# Patient Record
Sex: Male | Born: 1960
Health system: Southern US, Community
[De-identification: ages and names within clinical notes are randomized; demographics above are authoritative.]

## PROBLEM LIST (undated history)

## (undated) DIAGNOSIS — I10 Essential (primary) hypertension: Secondary | ICD-10-CM

## (undated) DIAGNOSIS — G4733 Obstructive sleep apnea (adult) (pediatric): Principal | ICD-10-CM

## (undated) DIAGNOSIS — E119 Type 2 diabetes mellitus without complications: Secondary | ICD-10-CM

## (undated) DIAGNOSIS — F32A Depression, unspecified: Secondary | ICD-10-CM

## (undated) DIAGNOSIS — F329 Major depressive disorder, single episode, unspecified: Secondary | ICD-10-CM

## (undated) DIAGNOSIS — K219 Gastro-esophageal reflux disease without esophagitis: Secondary | ICD-10-CM

## (undated) DIAGNOSIS — E785 Hyperlipidemia, unspecified: Secondary | ICD-10-CM

## (undated) DIAGNOSIS — G4730 Sleep apnea, unspecified: Secondary | ICD-10-CM

## (undated) DIAGNOSIS — E079 Disorder of thyroid, unspecified: Secondary | ICD-10-CM

## (undated) DIAGNOSIS — F419 Anxiety disorder, unspecified: Secondary | ICD-10-CM

## (undated) DIAGNOSIS — E039 Hypothyroidism, unspecified: Secondary | ICD-10-CM

## (undated) HISTORY — DX: Major depressive disorder, single episode, unspecified: F32.9

## (undated) HISTORY — DX: Obstructive sleep apnea (adult) (pediatric): G47.33

## (undated) HISTORY — DX: Disorder of thyroid, unspecified: E07.9

## (undated) HISTORY — DX: Hyperlipidemia, unspecified: E78.5

## (undated) HISTORY — DX: Type 2 diabetes mellitus without complications: E11.9

## (undated) HISTORY — DX: Sleep apnea, unspecified: G47.30

## (undated) HISTORY — PX: VASECTOMY: SHX75

## (undated) HISTORY — DX: Anxiety disorder, unspecified: F41.9

## (undated) HISTORY — DX: Depression, unspecified: F32.A

---

## 2000-09-26 ENCOUNTER — Ambulatory Visit (HOSPITAL_BASED_OUTPATIENT_CLINIC_OR_DEPARTMENT_OTHER): Admission: RE | Admit: 2000-09-26 | Discharge: 2000-09-26 | Payer: Self-pay | Admitting: Urology

## 2002-07-14 ENCOUNTER — Ambulatory Visit (HOSPITAL_BASED_OUTPATIENT_CLINIC_OR_DEPARTMENT_OTHER): Admission: RE | Admit: 2002-07-14 | Discharge: 2002-07-14 | Payer: Self-pay | Admitting: Internal Medicine

## 2004-04-01 HISTORY — PX: ANKLE SURGERY: SHX546

## 2005-01-08 ENCOUNTER — Ambulatory Visit (HOSPITAL_COMMUNITY): Admission: RE | Admit: 2005-01-08 | Discharge: 2005-01-08 | Payer: Self-pay | Admitting: Orthopaedic Surgery

## 2005-01-08 ENCOUNTER — Ambulatory Visit (HOSPITAL_BASED_OUTPATIENT_CLINIC_OR_DEPARTMENT_OTHER): Admission: RE | Admit: 2005-01-08 | Discharge: 2005-01-08 | Payer: Self-pay | Admitting: Orthopaedic Surgery

## 2009-07-17 ENCOUNTER — Emergency Department (HOSPITAL_COMMUNITY): Admission: EM | Admit: 2009-07-17 | Discharge: 2009-07-17 | Payer: Self-pay | Admitting: Emergency Medicine

## 2010-06-19 LAB — URINALYSIS, ROUTINE W REFLEX MICROSCOPIC
Bilirubin Urine: NEGATIVE
Glucose, UA: NEGATIVE mg/dL
Hgb urine dipstick: NEGATIVE
Ketones, ur: NEGATIVE mg/dL
Nitrite: NEGATIVE
Protein, ur: NEGATIVE mg/dL
Specific Gravity, Urine: 1.024 (ref 1.005–1.030)
Urobilinogen, UA: 0.2 mg/dL (ref 0.0–1.0)
pH: 5.5 (ref 5.0–8.0)

## 2010-08-17 NOTE — Op Note (Signed)
[facility]ernando Salinas. [facility]  Patient:    Robert Wise, Robert Wise                     MRN: [id number] Proc. Date: 09/26/00 Adm. Date:  [id number] Attending:  Katherine Roan                           Operative Report  PREOPERATIVE DIAGNOSIS:  Elective sterilization, bilateral hydroceles.  POSTOPERATIVE DIAGNOSIS:  Elective sterilization, bilateral hydroceles.  OPERATION:  Partial bilateral vasectomy.  ANESTHESIA: General.  SURGEON:  Rozanna Boer., M.D.  BRIEF HISTORY:  This 50 year old patient was admitted to have a vasectomy done for elective sterilization under general anesthesia due to his scrotal anatomy.  He has some bilateral hydroceles that make it difficult to feel the cord and the vas deferens.  He was thoroughly counselled before the procedure and signed the consent form and ______ to have this done at this time.  DESCRIPTION OF PROCEDURE:  The patient was placed supine on the operating table.  He was shaved, prepped and draped in the usual sterile fashion after induction of general anesthesia.  The left vas was then picked up and a small transverse incision in the upper scrotum was made.  The vas was delivered into the operative field, dissected free for about an inch and clamped and transected.  The ends of the vas were then cauterized and ligated with 3-0 chromic catgut.  The vas was dropped back into the left hemiscrotal compartment and 2 or 3 small 4-0 chromic stitches were then used to close the skin on this side.  On the patients right side, the vas was also similarly found.  A small transverse incision over the upper scrotum was made and the vas was delivered in a routine fashion.  Again, a segment about an inch long was then removed, transected and the ends cauterized and tied with 3-0 chromic catgut suture.  The skin edges were then reapproximated with interrupted 3-0 chromic catgut suture and a dressing of collodion was  applied.  The patient was taken to the recovery room in good condition to be later discharged as an outpatient with detailed written instructions. DD:  09/26/00 TD:  09/26/00 Job: [id number]

## 2010-08-17 NOTE — Op Note (Signed)
NAME:  Robert Wise, REDNER NO.:  [id number]   MEDICAL RECORD NO.:  [id number]          PATIENT TYPE:  AMB   LOCATION:  DSC                          FACILITY:  [facility]   PHYSICIAN:  Lubertha Basque. Dalldorf, M.D.DATE OF BIRTH:  1960/10/01   DATE OF PROCEDURE:  01/08/2005  DATE OF DISCHARGE:                                 OPERATIVE REPORT   PREOPERATIVE DIAGNOSIS:  Left ankle medial malleolus fracture.   POSTOPERATIVE DIAGNOSIS:  Left ankle medial malleolus fracture.   PROCEDURE:  ORIF of left ankle medial malleolus fracture.   ANESTHESIA:  General.   ATTENDING SURGEON:  Lubertha Basque. Jerl Santos, M.D.   ASSISTANT:  Lindwood Qua, P.A.   INDICATIONS FOR PROCEDURE:  The patient is a 50 year old man who fell  several days ago and sustained a displaced fracture of the medial malleolus.  He is offered ORIF in hopes of optimizing healing and potentially returning  him to work in a more expeditious fashion.  Informed operative consent was  obtained after discussing the possible complications of reaction to  anesthesia, infection, and neurovascular injury.   DESCRIPTION OF PROCEDURE:  The patient was taken to the operating suite  where general anesthetic was applied without difficulty.  He was positioned  supine and prepped and draped in normal sterile fashion.  After the  administration of IV antibiotic, the left leg was elevated, exsanguinated,  and a tourniquet inflated about the calf.  A medial incision was made with  dissection down to the medial malleolus fracture site.  Some periosteum was  removed from the fracture site allowing an anatomic reduction which was  stabilized with a towel clip.  This was then secured with a malleolar screw  from the [employer] small fragment set.  This was a partially threaded, 4.0-mm,  cancellous screw about 45 mm in length.  Adequate placement was confirmed by  fluoroscopy.  I then placed a second screw slightly more posterior in a  nearly  parallel fashion in one plane, seemed to be divergent on the other.  Again I used fluoroscopy to confirm adequate placement of hardware and  reduction of the fracture and read all these views myself to make  appropriate intraoperative decisions.  The wound was irrigated.  The  tourniquet was deflated, and a small amount of bleeding was easily  controlled with Bovie cautery.  The foot and toes became pink and warm  immediately.  A 2-0 undyed Vicryl was used to reapproximate subcutaneous  tissues followed by skin closure with staples.  Some Marcaine was injected  about the incision site.  Adaptic was applied followed by dry gauze and a  posterior splint of plaster with the ankle in neutral position.   Estimated blood loss and intraoperative fluids as well as accurate  tourniquet time can be obtained from anesthesia records.   DISPOSITION:  The patient was extubated in the operating room and taken to  recovery room in stable condition.  Plans were for him to go home the same  day and to follow up in the office in less than a week. I will contact him  by  phone tonight.      Lubertha Basque Jerl Santos, M.D.  Electronically Signed     PGD/MEDQ  D:  01/08/2005  T:  01/08/2005  Job:  [id number]

## 2011-03-13 ENCOUNTER — Ambulatory Visit (INDEPENDENT_AMBULATORY_CARE_PROVIDER_SITE_OTHER): Payer: PRIVATE HEALTH INSURANCE

## 2011-03-13 DIAGNOSIS — I10 Essential (primary) hypertension: Secondary | ICD-10-CM

## 2011-05-04 ENCOUNTER — Other Ambulatory Visit: Payer: Self-pay | Admitting: Family Medicine

## 2011-06-07 ENCOUNTER — Encounter: Payer: Self-pay | Admitting: Family Medicine

## 2011-06-07 ENCOUNTER — Telehealth: Payer: Self-pay

## 2011-06-07 NOTE — Telephone Encounter (Signed)
Dr Marylu Lund you want to write letter for pt?

## 2011-06-07 NOTE — Telephone Encounter (Signed)
I do not see an entry of mine in [facility] for this patient.  Can you bring me his written chart to review?

## 2011-06-07 NOTE — Telephone Encounter (Signed)
Pt took a pre-employment drug test elsewhere and thinks his rx for hydrocodone has cause him to test positive. Would like a note from [place] confirming we prescribe this to him.

## 2011-06-07 NOTE — Telephone Encounter (Signed)
LMOM on cell that request has been done and is ready for p/up

## 2011-08-21 ENCOUNTER — Ambulatory Visit (INDEPENDENT_AMBULATORY_CARE_PROVIDER_SITE_OTHER): Payer: PRIVATE HEALTH INSURANCE | Admitting: Family Medicine

## 2011-08-21 VITALS — BP 135/77 | HR 64 | Temp 98.2°F | Resp 16 | Ht 71.0 in | Wt 249.0 lb

## 2011-08-21 DIAGNOSIS — F411 Generalized anxiety disorder: Secondary | ICD-10-CM

## 2011-08-21 DIAGNOSIS — G8929 Other chronic pain: Secondary | ICD-10-CM

## 2011-08-21 DIAGNOSIS — K219 Gastro-esophageal reflux disease without esophagitis: Secondary | ICD-10-CM

## 2011-08-21 DIAGNOSIS — M549 Dorsalgia, unspecified: Secondary | ICD-10-CM

## 2011-08-21 DIAGNOSIS — F419 Anxiety disorder, unspecified: Secondary | ICD-10-CM

## 2011-08-21 DIAGNOSIS — E039 Hypothyroidism, unspecified: Secondary | ICD-10-CM

## 2011-08-21 DIAGNOSIS — E785 Hyperlipidemia, unspecified: Secondary | ICD-10-CM

## 2011-08-21 LAB — COMPREHENSIVE METABOLIC PANEL
ALT: 34 U/L (ref 0–53)
AST: 45 U/L — ABNORMAL HIGH (ref 0–37)
Albumin: 4.7 g/dL (ref 3.5–5.2)
Alkaline Phosphatase: 65 U/L (ref 39–117)
BUN: 13 mg/dL (ref 6–23)
CO2: 26 mEq/L (ref 19–32)
Calcium: 9.3 mg/dL (ref 8.4–10.5)
Chloride: 102 mEq/L (ref 96–112)
Creat: 0.77 mg/dL (ref 0.50–1.35)
Glucose, Bld: 137 mg/dL — ABNORMAL HIGH (ref 70–99)
Potassium: 4.4 mEq/L (ref 3.5–5.3)
Sodium: 137 mEq/L (ref 135–145)
Total Bilirubin: 0.5 mg/dL (ref 0.3–1.2)
Total Protein: 6.9 g/dL (ref 6.0–8.3)

## 2011-08-21 LAB — POCT CBC
Granulocyte percent: 56.4 %G (ref 37–80)
HCT, POC: 41.9 % — AB (ref 43.5–53.7)
Hemoglobin: 13.9 g/dL — AB (ref 14.1–18.1)
Lymph, poc: 1.8 (ref 0.6–3.4)
MCH, POC: 30.8 pg (ref 27–31.2)
MCHC: 33.2 g/dL (ref 31.8–35.4)
MCV: 92.8 fL (ref 80–97)
MID (cbc): 0.5 (ref 0–0.9)
MPV: 9 fL (ref 0–99.8)
POC Granulocyte: 3 (ref 2–6.9)
POC LYMPH PERCENT: 34 %L (ref 10–50)
POC MID %: 9.6 %M (ref 0–12)
Platelet Count, POC: 205 10*3/uL (ref 142–424)
RBC: 4.51 M/uL — AB (ref 4.69–6.13)
RDW, POC: 14.3 %
WBC: 5.3 10*3/uL (ref 4.6–10.2)

## 2011-08-21 LAB — LIPID PANEL
Cholesterol: 190 mg/dL (ref 0–200)
HDL: 31 mg/dL — ABNORMAL LOW (ref >39)
LDL Cholesterol: 95 mg/dL (ref 0–99)
Total CHOL/HDL Ratio: 6.1 Ratio
Triglycerides: 318 mg/dL — ABNORMAL HIGH (ref <150)
VLDL: 64 mg/dL — ABNORMAL HIGH (ref 0–40)

## 2011-08-21 LAB — TSH: TSH: 4.58 u[IU]/mL — ABNORMAL HIGH (ref 0.350–4.500)

## 2011-08-21 MED ORDER — PAROXETINE HCL 20 MG PO TABS: 20.0000 mg | ORAL_TABLET | ORAL | Status: DC

## 2011-08-21 MED ORDER — OMEPRAZOLE 20 MG PO CPDR: 20.0000 mg | DELAYED_RELEASE_CAPSULE | Freq: Every day | ORAL | Status: DC

## 2011-08-21 MED ORDER — HYDROCODONE-ACETAMINOPHEN 7.5-500 MG PO TABS: 1.0000 | ORAL_TABLET | Freq: Four times a day (QID) | ORAL | Status: DC | PRN

## 2011-08-21 MED ORDER — PRAVASTATIN SODIUM 40 MG PO TABS: 40.0000 mg | ORAL_TABLET | Freq: Every day | ORAL | Status: DC

## 2011-08-21 MED ORDER — LEVOTHYROXINE SODIUM 150 MCG PO TABS: 150.0000 ug | ORAL_TABLET | Freq: Every day | ORAL | Status: DC

## 2011-08-21 NOTE — Progress Notes (Signed)
This 51 year old gentleman who comes in for refill on his medications. (We have 30 patients waiting today so I'm not able to spend a full time at a need to spend with this patient). He is a chronic patient of mine who has problems with alcohol and chronic back pain. He is currently attending AA meetings and try to hold his marriage together.  He's had no new symptoms he is now working driving a canker truck. He's had no alcohol for a week.  Objective: Alert no acute distress  HEENT: Unremarkable  Chest: Clear  Heart: Regular no murmur  Abdomen: Soft nontender with smooth liver edge about 2 cm below the right costal margin margin and about 8 cm in span in the midclavicular line.  Assessment: Chronic pain with need for refills, seems stable.  Plan dispensed next time talking about alcohol encouraging patient to continue his AA meetings. I've refilled his medicines for 5 months he'll return if there are any new problems. We'll check his labs today.

## 2011-10-20 ENCOUNTER — Ambulatory Visit (INDEPENDENT_AMBULATORY_CARE_PROVIDER_SITE_OTHER): Payer: PRIVATE HEALTH INSURANCE | Admitting: Family Medicine

## 2011-10-20 VITALS — BP 112/58 | HR 84 | Temp 97.8°F | Resp 16 | Ht 70.5 in | Wt 236.0 lb

## 2011-10-20 DIAGNOSIS — R739 Hyperglycemia, unspecified: Secondary | ICD-10-CM

## 2011-10-20 DIAGNOSIS — E119 Type 2 diabetes mellitus without complications: Secondary | ICD-10-CM

## 2011-10-20 LAB — POCT GLYCOSYLATED HEMOGLOBIN (HGB A1C): Hemoglobin A1C: 10

## 2011-10-20 LAB — COMPREHENSIVE METABOLIC PANEL
ALT: 21 U/L (ref 0–53)
AST: 18 U/L (ref 0–37)
Albumin: 4.4 g/dL (ref 3.5–5.2)
Alkaline Phosphatase: 82 U/L (ref 39–117)
BUN: 9 mg/dL (ref 6–23)
CO2: 27 mEq/L (ref 19–32)
Calcium: 9.3 mg/dL (ref 8.4–10.5)
Chloride: 100 mEq/L (ref 96–112)
Creat: 0.77 mg/dL (ref 0.50–1.35)
Glucose, Bld: 258 mg/dL — ABNORMAL HIGH (ref 70–99)
Potassium: 4.4 mEq/L (ref 3.5–5.3)
Sodium: 136 mEq/L (ref 135–145)
Total Bilirubin: 0.4 mg/dL (ref 0.3–1.2)
Total Protein: 7 g/dL (ref 6.0–8.3)

## 2011-10-20 LAB — GLUCOSE, POCT (MANUAL RESULT ENTRY): POC Glucose: 248 mg/dl — AB (ref 70–99)

## 2011-10-20 MED ORDER — FREESTYLE SYSTEM KIT: 1.0000 | PACK | Status: AC | PRN

## 2011-10-20 MED ORDER — SITAGLIPTIN PHOS-METFORMIN HCL 50-500 MG PO TABS: 1.0000 | ORAL_TABLET | Freq: Two times a day (BID) | ORAL | Status: DC

## 2011-10-20 NOTE — Progress Notes (Signed)
51 yo with 10 days of polyuria, blurry vision, diarrhea  Has completely stopped the alcohol as of May  Positive family hx of diabetes.  Objective  Results for orders placed in visit on 10/20/11  POCT GLYCOSYLATED HEMOGLOBIN (HGB A1C)      Component Value Range   Hemoglobin A1C 10.0    GLUCOSE, POCT (MANUAL RESULT ENTRY)      Component Value Range   POC Glucose 248 (*) 70 - 99 mg/dl

## 2011-10-20 NOTE — Patient Instructions (Addendum)
Diabetes Meal Planning Guide The diabetes meal planning guide is a tool to help you plan your meals and snacks. It is important for people with diabetes to manage their blood glucose (sugar) levels. Choosing the right foods and the right amounts throughout your day will help control your blood glucose. Eating right can even help you improve your blood pressure and reach or maintain a healthy weight. CARBOHYDRATE COUNTING MADE EASY When you eat carbohydrates, they turn to sugar. This raises your blood glucose level. Counting carbohydrates can help you control this level so you feel better. When you plan your meals by counting carbohydrates, you can have more flexibility in what you eat and balance your medicine with your food intake. Carbohydrate counting simply means adding up the total amount of carbohydrate grams in your meals and snacks. Try to eat about the same amount at each meal. Foods with carbohydrates are listed below. Each portion below is 1 carbohydrate serving or 15 grams of carbohydrates. Ask your dietician how many grams of carbohydrates you should eat at each meal or snack. Grains and Starches  1 slice bread.    English muffin or hotdog/hamburger bun.    cup cold cereal (unsweetened).   ? cup cooked pasta or rice.    cup starchy vegetables (corn, potatoes, peas, beans, winter squash).   1 tortilla (6 inches).    bagel.   1 waffle or pancake (size of a CD).    cup cooked cereal.   4 to 6 small crackers.  *Whole grain is recommended. Fruit  1 cup fresh unsweetened berries, melon, papaya, pineapple.   1 small fresh fruit.    banana or mango.    cup fruit juice (4 oz unsweetened).    cup canned fruit in natural juice or water.   2 tbs dried fruit.   12 to 15 grapes or cherries.  Milk and Yogurt  1 cup fat-free or 1% milk.   1 cup soy milk.   6 oz light yogurt with sugar-free sweetener.   6 oz low-fat soy yogurt.   6 oz plain yogurt.   Vegetables  1 cup raw or  cup cooked is counted as 0 carbohydrates or a "free" food.   If you eat 3 or more servings at 1 meal, count them as 1 carbohydrate serving.  Other Carbohydrates   oz chips or pretzels.    cup ice cream or frozen yogurt.    cup sherbet or sorbet.   2 inch square cake, no frosting.   1 tbs honey, sugar, jam, jelly, or syrup.   2 small cookies.   3 squares of graham crackers.   3 cups popcorn.   6 crackers.   1 cup broth-based soup.   Count 1 cup casserole or other mixed foods as 2 carbohydrate servings.   Foods with less than 20 calories in a serving may be counted as 0 carbohydrates or a "free" food.  You may want to purchase a book or computer software that lists the carbohydrate gram counts of different foods. In addition, the nutrition facts panel on the labels of the foods you eat are a good source of this information. The label will tell you how big the serving size is and the total number of carbohydrate grams you will be eating per serving. Divide this number by 15 to obtain the number of carbohydrate servings in a portion. Remember, 1 carbohydrate serving equals 15 grams of carbohydrate. SERVING SIZES Measuring foods and serving sizes helps you   make sure you are getting the right amount of food. The list below tells how big or small some common serving sizes are.  1 oz.........4 stacked dice.   3 oz........Marland KitchenDeck of cards.   1 tsp.......Marland KitchenTip of little finger.   1 tbs......Marland KitchenMarland KitchenThumb.   2 tbs.......Marland KitchenGolf ball.    cup......Marland KitchenHalf of a fist.   1 cup.......Marland KitchenA fist.  SAMPLE DIABETES MEAL PLAN Below is a sample meal plan that includes foods from the grain and starches, dairy, vegetable, fruit, and meat groups. A dietician can individualize a meal plan to fit your calorie needs and tell you the number of servings needed from each food group. However, controlling the total amount of carbohydrates in your meal or snack is more important than  making sure you include all of the food groups at every meal. You may interchange carbohydrate containing foods (dairy, starches, and fruits). The meal plan below is an example of a 2000 calorie diet using carbohydrate counting. This meal plan has 17 carbohydrate servings. Breakfast  1 cup oatmeal (2 carb servings).    cup light yogurt (1 carb serving).   1 cup blueberries (1 carb serving).    cup almonds.  Snack  1 large apple (2 carb servings).   1 low-fat string cheese stick.  Lunch  Chicken breast salad.   1 cup spinach.    cup chopped tomatoes.   2 oz chicken breast, sliced.   2 tbs low-fat [place] dressing.   12 whole-wheat crackers (2 carb servings).   12 to 15 grapes (1 carb serving).   1 cup low-fat milk (1 carb serving).  Snack  1 cup carrots.    cup hummus (1 carb serving).  Dinner  3 oz broiled salmon.   1 cup brown rice (3 carb servings).  Snack  1  cups steamed broccoli (1 carb serving) drizzled with 1 tsp olive oil and lemon juice.   1 cup light pudding (2 carb servings).  DIABETES MEAL PLANNING WORKSHEET Your dietician can use this worksheet to help you decide how many servings of foods and what types of foods are right for you.  BREAKFAST Food Group and Servings / Carb Servings Grain/Starches __________________________________ Dairy __________________________________________ Vegetable ______________________________________ Fruit ___________________________________________ Meat __________________________________________ Fat ____________________________________________ LUNCH Food Group and Servings / Carb Servings Grain/Starches ___________________________________ Dairy ___________________________________________ Fruit ____________________________________________ Meat ___________________________________________ Fat _____________________________________________ Laural Golden Food Group and Servings / Carb Servings Grain/Starches  ___________________________________ Dairy ___________________________________________ Fruit ____________________________________________ Meat ___________________________________________ Fat _____________________________________________ SNACKS Food Group and Servings / Carb Servings Grain/Starches ___________________________________ Dairy ___________________________________________ Vegetable _______________________________________ Fruit ____________________________________________ Meat ___________________________________________ Fat _____________________________________________ DAILY TOTALS Starches _________________________ Vegetable ________________________ Fruit ____________________________ Dairy ____________________________ Meat ____________________________ Fat ______________________________ Document Released: 12/13/2004 Document Revised: 03/07/2011 Document Reviewed: 10/24/2008 [employer] Patient Information 2012 [place], [place].Diabetes and Foot Care Diabetes may cause you to have a poor blood supply (circulation) to your legs and feet. Because of this, the skin may be thinner, break easier, and heal more slowly. You also may have nerve damage in your legs and feet causing decreased feeling. You may not notice minor injuries to your feet that could lead to serious problems or infections. Taking care of your feet is one of the most important things you can do for yourself.  HOME CARE INSTRUCTIONS  Do not go barefoot. Bare feet are easily injured.   Check your feet daily for blisters, cuts, and redness.   Wash your feet with warm water (not hot) and mild soap. Pat your feet and between your toes until completely dry.  Apply a moisturizing lotion that does not contain alcohol or petroleum jelly to the dry skin on your feet and to dry brittle toenails. Do not put it between your toes.   Trim your toenails straight across. Do not dig under them or around the cuticle.   Do not cut  corns or calluses, or try to remove them with medicine.   Wear clean cotton socks or stockings every day. Make sure they are not too tight. Do not wear knee high stockings since they may decrease blood flow to your legs.   Wear leather shoes that fit properly and have enough cushioning. To break in new shoes, wear them just a few hours a day to avoid injuring your feet.   Wear shoes at all times, even in the house.   Do not cross your legs. This may decrease the blood flow to your feet.   If you find a minor scrape, cut, or break in the skin on your feet, keep it and the skin around it clean and dry. These areas may be cleansed with mild soap and water. Do not use peroxide, alcohol, iodine or Merthiolate.   When you remove an adhesive bandage, be sure not to harm the skin around it.   If you have a wound, look at it several times a day to make sure it is healing.   Do not use heating pads or hot water bottles. Burns can occur. If you have lost feeling in your feet or legs, you may not know it is happening until it is too late.   Report any cuts, sores or bruises to your caregiver. Do not wait!  SEEK MEDICAL CARE IF:   You have an injury that is not healing or you notice redness, numbness, burning, or tingling.   Your feet always feel cold.   You have pain or cramps in your legs and feet.  SEEK IMMEDIATE MEDICAL CARE IF:   There is increasing redness, swelling, or increasing pain in the wound.   There is a red line that goes up your leg.   Pus is coming from a wound.   You develop an unexplained oral temperature above 102 F (38.9 C), or as your caregiver suggests.   You notice a bad smell coming from an ulcer or wound.  MAKE SURE YOU:   Understand these instructions.   Will watch your condition.   Will get help right away if you are not doing well or get worse.  Document Released: 03/15/2000 Document Revised: 03/07/2011 Document Reviewed: 09/21/2008 [facility] Patient  Information 2012 [place], [place].Diabetes and Exercise Regular exercise is important and can help:   Control blood glucose (sugar).   Decrease blood pressure.    Control blood lipids (cholesterol, triglycerides).   Improve overall health.  BENEFITS FROM EXERCISE  Improved fitness.   Improved flexibility.   Improved endurance.   Increased bone density.   Weight control.   Increased muscle strength.   Decreased body fat.   Improvement of the body's use of insulin, a hormone.   Increased insulin sensitivity.   Reduction of insulin needs.   Reduced stress and tension.   Helps you feel better.  People with diabetes who add exercise to their lifestyle gain additional benefits, including:  Weight loss.   Reduced appetite.   Improvement of the body's use of blood glucose.   Decreased risk factors for heart disease:   Lowering of cholesterol and triglycerides.   Raising the level of good cholesterol (high-density  lipoproteins, HDL).   Lowering blood sugar.   Decreased blood pressure.  TYPE 1 DIABETES AND EXERCISE  Exercise will usually lower your blood glucose.   If blood glucose is greater than 240 mg/dl, check urine ketones. If ketones are present, do not exercise.   Location of the insulin injection sites may need to be adjusted with exercise. Avoid injecting insulin into areas of the body that will be exercised. For example, avoid injecting insulin into:   The arms when playing tennis.   The legs when jogging. For more information, discuss this with your caregiver.   Keep a record of:   Food intake.   Type and amount of exercise.   Expected peak times of insulin action.   Blood glucose levels.  Do this before, during, and after exercise. Review your records with your caregiver. This will help you to develop guidelines for adjusting food intake and insulin amounts.  TYPE 2 DIABETES AND EXERCISE  Regular physical activity can help control blood  glucose.   Exercise is important because it may:   Increase the body's sensitivity to insulin.   Improve blood glucose control.   Exercise reduces the risk of heart disease. It decreases serum cholesterol and triglycerides. It also lowers blood pressure.   Those who take insulin or oral hypoglycemic agents should watch for signs of hypoglycemia. These signs include dizziness, shaking, sweating, chills, and confusion.   Body water is lost during exercise. It must be replaced. This will help to avoid loss of body fluids (dehydration) or heat stroke.  Be sure to talk to your caregiver before starting an exercise program to make sure it is safe for you. Remember, any activity is better than none.  Document Released: 06/08/2003 Document Revised: 03/07/2011 Document Reviewed: 09/22/2008 [facility] Patient Information 2012 [place], [place].Diet should limit carbohydrates and sugary drinks

## 2011-10-24 ENCOUNTER — Telehealth: Payer: Self-pay

## 2011-10-24 NOTE — Telephone Encounter (Signed)
Patient needs script for ultra touch two STRIPS [facility] [place] [phone]

## 2011-10-25 ENCOUNTER — Other Ambulatory Visit: Payer: Self-pay

## 2011-10-25 MED ORDER — ONETOUCH ULTRA SYSTEM W/DEVICE KIT: 1.0000 | PACK | Freq: Once | Status: DC

## 2011-10-25 NOTE — Telephone Encounter (Signed)
Sent to pharmacy 

## 2011-10-26 NOTE — Telephone Encounter (Signed)
PT WIFE NOTIFIED

## 2011-10-28 ENCOUNTER — Telehealth: Payer: Self-pay

## 2011-10-28 NOTE — Telephone Encounter (Signed)
Patients wife called stating patient has been diagnosed last week with diabetes and has been trying to get medication for it, but has had trouble getting insurance to cover supplies. She has not been able to check patients sugar so she doesn't know if its high or low and patient is feeling very poorly. She stated that ins will cover One Touch and we rx'ed that but still needs lancets and strips. [facility] [place]. Please call ASAP at [phone].

## 2011-10-29 MED ORDER — GLUCOSE BLOOD VI STRP: ORAL_STRIP | Status: AC

## 2011-10-29 MED ORDER — ONETOUCH ULTRASOFT LANCETS MISC: Status: DC

## 2011-10-29 NOTE — Telephone Encounter (Signed)
Janumet was Rx for the medication.  I will send in test strips and lancets to the pharmacy.

## 2011-10-29 NOTE — Telephone Encounter (Signed)
Notified wife that Rxs were sent in w/note as to the type of monitor pt has, but advised pt to double check that it is for the correct monitor bf leaving the pharmacy. Wife agreed.

## 2011-12-02 ENCOUNTER — Ambulatory Visit (INDEPENDENT_AMBULATORY_CARE_PROVIDER_SITE_OTHER): Payer: PRIVATE HEALTH INSURANCE | Admitting: Family Medicine

## 2011-12-02 VITALS — BP 110/66 | HR 71 | Temp 98.0°F | Resp 16 | Ht 71.0 in | Wt 244.0 lb

## 2011-12-02 DIAGNOSIS — M549 Dorsalgia, unspecified: Secondary | ICD-10-CM

## 2011-12-02 DIAGNOSIS — K219 Gastro-esophageal reflux disease without esophagitis: Secondary | ICD-10-CM

## 2011-12-02 DIAGNOSIS — F411 Generalized anxiety disorder: Secondary | ICD-10-CM

## 2011-12-02 DIAGNOSIS — G8929 Other chronic pain: Secondary | ICD-10-CM

## 2011-12-02 DIAGNOSIS — R7309 Other abnormal glucose: Secondary | ICD-10-CM

## 2011-12-02 DIAGNOSIS — R739 Hyperglycemia, unspecified: Secondary | ICD-10-CM

## 2011-12-02 DIAGNOSIS — E039 Hypothyroidism, unspecified: Secondary | ICD-10-CM

## 2011-12-02 DIAGNOSIS — E785 Hyperlipidemia, unspecified: Secondary | ICD-10-CM

## 2011-12-02 DIAGNOSIS — F419 Anxiety disorder, unspecified: Secondary | ICD-10-CM

## 2011-12-02 LAB — COMPREHENSIVE METABOLIC PANEL
ALT: 16 U/L (ref 0–53)
AST: 16 U/L (ref 0–37)
Albumin: 4.6 g/dL (ref 3.5–5.2)
Alkaline Phosphatase: 73 U/L (ref 39–117)
BUN: 13 mg/dL (ref 6–23)
CO2: 25 mEq/L (ref 19–32)
Calcium: 9.7 mg/dL (ref 8.4–10.5)
Chloride: 104 mEq/L (ref 96–112)
Creat: 0.71 mg/dL (ref 0.50–1.35)
Glucose, Bld: 150 mg/dL — ABNORMAL HIGH (ref 70–99)
Potassium: 4.6 mEq/L (ref 3.5–5.3)
Sodium: 137 mEq/L (ref 135–145)
Total Bilirubin: 0.2 mg/dL — ABNORMAL LOW (ref 0.3–1.2)
Total Protein: 6.7 g/dL (ref 6.0–8.3)

## 2011-12-02 LAB — POCT CBC
Granulocyte percent: 64.8 %G (ref 37–80)
HCT, POC: 45.1 % (ref 43.5–53.7)
Hemoglobin: 14.7 g/dL (ref 14.1–18.1)
Lymph, poc: 2 (ref 0.6–3.4)
MCH, POC: 29.8 pg (ref 27–31.2)
MCHC: 32.6 g/dL (ref 31.8–35.4)
MCV: 91.3 fL (ref 80–97)
MID (cbc): 0.7 (ref 0–0.9)
MPV: 9.7 fL (ref 0–99.8)
POC Granulocyte: 4.8 (ref 2–6.9)
POC LYMPH PERCENT: 26.4 %L (ref 10–50)
POC MID %: 8.8 %M (ref 0–12)
Platelet Count, POC: 280 10*3/uL (ref 142–424)
RBC: 4.94 M/uL (ref 4.69–6.13)
RDW, POC: 15.3 %
WBC: 7.4 10*3/uL (ref 4.6–10.2)

## 2011-12-02 LAB — POCT GLYCOSYLATED HEMOGLOBIN (HGB A1C): Hemoglobin A1C: 8.3

## 2011-12-02 MED ORDER — PRAVASTATIN SODIUM 40 MG PO TABS: 40.0000 mg | ORAL_TABLET | Freq: Every day | ORAL | Status: DC

## 2011-12-02 MED ORDER — HYDROCODONE-ACETAMINOPHEN 7.5-500 MG PO TABS: 1.0000 | ORAL_TABLET | Freq: Four times a day (QID) | ORAL | Status: DC | PRN

## 2011-12-02 MED ORDER — SITAGLIPTIN PHOS-METFORMIN HCL 50-500 MG PO TABS: 1.0000 | ORAL_TABLET | ORAL | Status: DC

## 2011-12-02 MED ORDER — LEVOTHYROXINE SODIUM 150 MCG PO TABS: 150.0000 ug | ORAL_TABLET | Freq: Every day | ORAL | Status: DC

## 2011-12-02 MED ORDER — OMEPRAZOLE 20 MG PO CPDR: 20.0000 mg | DELAYED_RELEASE_CAPSULE | Freq: Every day | ORAL | Status: DC

## 2011-12-02 MED ORDER — PAROXETINE HCL 20 MG PO TABS: 20.0000 mg | ORAL_TABLET | ORAL | Status: DC

## 2011-12-02 NOTE — Progress Notes (Signed)
@[email or web address] @  Patient ID: Robert Wise MRN: [id number], DOB: 1960-10-23, 51 y.o. Date of Encounter: 12/02/2011, 9:27 AM  Primary Physician: No primary provider on file.  Chief Complaint: Diabetes follow up  HPI: 51 y.o. year old male with history below presents for follow up of diabetes mellitus. Doing well. No issues or complaints. Taking medications daily without adverse effects. No polydipsia, polyphagia, polyuria, or nocturia.  Blood sugars at home: 90 to 210 Recently got his 90 day AA chip    No past medical history on file.   Home Meds: Prior to Admission medications   Medication Sig Start Date End Date Taking? Authorizing Provider  Blood Glucose Monitoring Suppl (ONE TOUCH ULTRA SYSTEM KIT) W/DEVICE KIT 1 kit by Does not apply route once. 10/25/11  Yes Morrell Riddle, PA-C  fish oil-omega-3 fatty acids 1000 MG capsule Take 2 g by mouth daily.   Yes Historical Provider, MD  glucose blood test strip Use as instructed 10/29/11 10/28/12 Yes Sarah Harvie Bridge, PA-C  glucose monitoring kit (FREESTYLE) monitoring kit 1 each by Does not apply route as needed for other. 10/20/11 10/19/12 Yes Elvina Sidle, MD  HYDROcodone-acetaminophen (LORTAB) 7.5-500 MG per tablet Take 1 tablet by mouth every 6 (six) hours as needed. 12/02/11  Yes Elvina Sidle, MD  Lancets [facility] ULTRASOFT) lancets Use as instructed 10/29/11 10/28/12 Yes Sarah Harvie Bridge, PA-C  levothyroxine (SYNTHROID, LEVOTHROID) 150 MCG tablet Take 1 tablet (150 mcg total) by mouth daily. 12/02/11  Yes Elvina Sidle, MD  omeprazole (PRILOSEC) 20 MG capsule Take 1 capsule (20 mg total) by mouth daily. 12/02/11  Yes Elvina Sidle, MD  PARoxetine (PAXIL) 20 MG tablet Take 1 tablet (20 mg total) by mouth every morning. 12/02/11  Yes Elvina Sidle, MD  pravastatin (PRAVACHOL) 40 MG tablet Take 1 tablet (40 mg total) by mouth at bedtime. 12/02/11  Yes Elvina Sidle, MD  sitaGLIPtan-metformin (JANUMET) 50-500 MG per tablet Take 1 tablet by mouth 1 day  or 1 dose. 12/02/11 12/01/12 Yes Elvina Sidle, MD    Allergies:  Allergies  Allergen Reactions  . Tricor (Fenofibrate)   . Zocor (Simvastatin)     History   Social History  . Marital Status: Married    Spouse Name: N/A    Number of Children: N/A  . Years of Education: N/A   Occupational History  . Not on file.   Social History Main Topics  . Smoking status: Current Everyday Smoker -- 1.5 packs/day for 37 years    Types: Cigarettes  . Smokeless tobacco: Not on file  . Alcohol Use: Not on file  . Drug Use: Not on file  . Sexually Active: Not on file   Other Topics Concern  . Not on file   Social History Narrative  . No narrative on file     Review of Systems: Constitutional: negative for chills, fever, night sweats, weight changes, or fatigue  HEENT: negative for vision changes, hearing loss, congestion, rhinorrhea, or epistaxis Cardiovascular: negative for chest pain, palpitations, diaphoresis, DOE, orthopnea, or edema Respiratory: negative for hemoptysis, wheezing, shortness of breath, dyspnea, or cough Abdominal: negative for abdominal pain, nausea, vomiting, diarrhea, or constipation Dermatological: negative for rash, erythema, or wounds Neurologic: negative for headache, dizziness, or syncope Renal:  Negative for polyuria, polydipsia, or dysuria All other systems reviewed and are otherwise negative with the exception to those above and in the HPI.   Physical Exam: Blood pressure 110/66, pulse 71, temperature 98 F (36.7 C), temperature source Oral, resp.  rate 16, height 5\' 11"  (1.803 m), weight 244 lb (110.678 kg), SpO2 95.00%., Body mass index is 34.03 kg/(m^2). General: Well developed, well nourished, in no acute distress. Head: Normocephalic, atraumatic, eyes without discharge, sclera non-icteric, nares are without discharge. Bilateral auditory canals clear, TM's are without perforation, pearly grey and translucent with reflective cone of light bilaterally.  Oral cavity moist, posterior pharynx without exudate, erythema, peritonsillar abscess, or post nasal drip.  Neck: Supple. No thyromegaly. Full ROM. No lymphadenopathy. Lungs: Clear bilaterally to auscultation without wheezes, rales, or rhonchi. Breathing is unlabored. Heart: RRR with S1 S2. No murmurs, rubs, or gallops appreciated. Abdomen: Soft, non-tender, non-distended with normoactive bowel sounds. No hepatosplenomegaly. No rebound/guarding. No obvious abdominal masses. Msk:  Strength and tone normal for age. Extremities/Skin: Warm and dry. No clubbing or cyanosis. No edema. No rashes, wounds, or suspicious lesions. Monofilament exam unremarkable bilaterally.  Neuro: Alert and oriented X 3. Moves all extremities spontaneously. Gait is normal. CNII-XII grossly in tact. Psych:  Responds to questions appropriately with a normal affect.   Labs:   ASSESSMENT AND PLAN:  51 y.o. year old male with fair control.  Needs to be more compliant.  Also will work on weight - 1. High blood sugar  sitaGLIPtan-metformin (JANUMET) 50-500 MG per tablet, POCT CBC, POCT glycosylated hemoglobin (Hb A1C), Comprehensive metabolic panel  2. Hyperlipidemia  pravastatin (PRAVACHOL) 40 MG tablet, Comprehensive metabolic panel  3. Anxiety  PARoxetine (PAXIL) 20 MG tablet, Comprehensive metabolic panel  4. GERD (gastroesophageal reflux disease)  omeprazole (PRILOSEC) 20 MG capsule, Comprehensive metabolic panel  5. Hypothyroid  levothyroxine (SYNTHROID, LEVOTHROID) 150 MCG tablet, Comprehensive metabolic panel  6. Back pain, chronic  HYDROcodone-acetaminophen (LORTAB) 7.5-500 MG per tablet, Comprehensive metabolic panel     Signed, Elvina Sidle, MD 12/02/2011 9:27 AM

## 2012-03-20 ENCOUNTER — Ambulatory Visit (INDEPENDENT_AMBULATORY_CARE_PROVIDER_SITE_OTHER): Payer: PRIVATE HEALTH INSURANCE | Admitting: Family Medicine

## 2012-03-20 VITALS — BP 130/82 | HR 77 | Temp 98.1°F | Resp 16 | Ht 71.0 in | Wt 240.0 lb

## 2012-03-20 DIAGNOSIS — G2581 Restless legs syndrome: Secondary | ICD-10-CM

## 2012-03-20 DIAGNOSIS — R739 Hyperglycemia, unspecified: Secondary | ICD-10-CM

## 2012-03-20 DIAGNOSIS — G8929 Other chronic pain: Secondary | ICD-10-CM

## 2012-03-20 DIAGNOSIS — H9209 Otalgia, unspecified ear: Secondary | ICD-10-CM

## 2012-03-20 DIAGNOSIS — Z0000 Encounter for general adult medical examination without abnormal findings: Secondary | ICD-10-CM

## 2012-03-20 DIAGNOSIS — R7309 Other abnormal glucose: Secondary | ICD-10-CM

## 2012-03-20 DIAGNOSIS — Z23 Encounter for immunization: Secondary | ICD-10-CM

## 2012-03-20 DIAGNOSIS — M549 Dorsalgia, unspecified: Secondary | ICD-10-CM

## 2012-03-20 DIAGNOSIS — E119 Type 2 diabetes mellitus without complications: Secondary | ICD-10-CM

## 2012-03-20 DIAGNOSIS — K219 Gastro-esophageal reflux disease without esophagitis: Secondary | ICD-10-CM

## 2012-03-20 LAB — POCT GLYCOSYLATED HEMOGLOBIN (HGB A1C): Hemoglobin A1C: 8.7

## 2012-03-20 MED ORDER — HYDROCODONE-ACETAMINOPHEN 7.5-500 MG PO TABS: 1.0000 | ORAL_TABLET | Freq: Four times a day (QID) | ORAL | Status: DC | PRN

## 2012-03-20 MED ORDER — SITAGLIPTIN PHOS-METFORMIN HCL 50-500 MG PO TABS: 1.0000 | ORAL_TABLET | ORAL | Status: DC

## 2012-03-20 MED ORDER — OMEPRAZOLE 20 MG PO CPDR
20.0000 mg | DELAYED_RELEASE_CAPSULE | Freq: Every day | ORAL | Status: DC
Start: 2012-03-20 — End: 2012-12-15

## 2012-03-20 MED ORDER — AMOXICILLIN 875 MG PO TABS: 875.0000 mg | ORAL_TABLET | Freq: Two times a day (BID) | ORAL | Status: DC

## 2012-03-20 MED ORDER — ROPINIROLE HCL 1 MG PO TABS: 1.0000 mg | ORAL_TABLET | Freq: Three times a day (TID) | ORAL | Status: DC

## 2012-03-20 NOTE — Addendum Note (Signed)
Addended by: Maryann Alar on: 03/20/2012 01:06 PM   Modules accepted: Orders

## 2012-03-20 NOTE — Progress Notes (Signed)
Patient ID: Robert Wise MRN: [id number], DOB: [birth year] 51 y.o. Date of Encounter: 03/20/2012, 12:42 PM  Primary Physician: Elvina Sidle, MD  Chief Complaint: Physical (CPE)  HPI: 51 y.o. y/o male with history noted below here for CPE.  Doing well. No issues/complaints.  Review of Systems: Consitutional: No fever, chills, fatigue, night sweats, lymphadenopathy, or weight changes. Eyes: No visual changes, eye redness, or discharge. ENT/Mouth: Ears: No otalgia, tinnitus, hearing loss, discharge. Nose: No congestion, rhinorrhea, sinus pain, or epistaxis. Throat: No sore throat, post nasal drip, or teeth pain. Cardiovascular: No CP, palpitations, diaphoresis, DOE, edema, orthopnea, PND. Respiratory: No cough, hemoptysis, SOB, or wheezing. Gastrointestinal: No anorexia, dysphagia, reflux, pain, nausea, vomiting, hematemesis, diarrhea, constipation, BRBPR, or melena. Genitourinary: No dysuria, frequency, urgency, hematuria, incontinence, nocturia, decreased urinary stream, discharge, impotence, or testicular pain/masses. Musculoskeletal: No decreased ROM, myalgias, stiffness, joint swelling, or weakness. Skin: No rash, erythema, lesion changes, pain, warmth, jaundice, or pruritis. Neurological: No headache, dizziness, syncope, seizures, tremors, memory loss, coordination problems, or paresthesias. Psychological: No anxiety, depression, hallucinations, SI/HI. Endocrine: No fatigue, polydipsia, polyphagia, polyuria, or known diabetes. All other systems were reviewed and are otherwise negative.  No past medical history on file.   No past surgical history on file.  Home Meds:  Prior to Admission medications   Medication Sig Start Date End Date Taking? Authorizing Provider  fish oil-omega-3 fatty acids 1000 MG capsule Take 2 g by mouth daily.   Yes Historical Provider, MD  glucose blood test strip Use as instructed 10/29/11 10/28/12 Yes Sarah Harvie Bridge, PA-C  glucose monitoring kit  (FREESTYLE) monitoring kit 1 each by Does not apply route as needed for other. 10/20/11 10/19/12 Yes Elvina Sidle, MD  HYDROcodone-acetaminophen (LORTAB) 7.5-500 MG per tablet Take 1 tablet by mouth every 6 (six) hours as needed (do not take during working hours). 03/20/12  Yes Elvina Sidle, MD  Lancets [facility] ULTRASOFT) lancets Use as instructed 10/29/11 10/28/12 Yes Sarah Harvie Bridge, PA-C  levothyroxine (SYNTHROID, LEVOTHROID) 150 MCG tablet Take 1 tablet (150 mcg total) by mouth daily. 12/02/11  Yes Elvina Sidle, MD  omeprazole (PRILOSEC) 20 MG capsule Take 1 capsule (20 mg total) by mouth daily. 03/20/12  Yes Elvina Sidle, MD  PARoxetine (PAXIL) 20 MG tablet Take 1 tablet (20 mg total) by mouth every morning. 12/02/11  Yes Elvina Sidle, MD  pravastatin (PRAVACHOL) 40 MG tablet Take 1 tablet (40 mg total) by mouth at bedtime. 12/02/11  Yes Elvina Sidle, MD  sitaGLIPtan-metformin (JANUMET) 50-500 MG per tablet Take 1 tablet by mouth 1 day or 1 dose. 03/20/12 03/20/13 Yes Elvina Sidle, MD  amoxicillin (AMOXIL) 875 MG tablet Take 1 tablet (875 mg total) by mouth 2 (two) times daily. 03/20/12   Elvina Sidle, MD  Blood Glucose Monitoring Suppl (ONE TOUCH ULTRA SYSTEM KIT) W/DEVICE KIT 1 kit by Does not apply route once. 10/25/11   Morrell Riddle, PA-C  rOPINIRole (REQUIP) 1 MG tablet Take 1 tablet (1 mg total) by mouth 3 (three) times daily. 03/20/12   Elvina Sidle, MD    Allergies:  Allergies  Allergen Reactions  . Tricor (Fenofibrate)   . Zocor (Simvastatin)     History   Social History  . Marital Status: Married    Spouse Name: N/A    Number of Children: N/A  . Years of Education: N/A   Occupational History  . Not on file.   Social History Main Topics  . Smoking status: Current Every Day Smoker -- 1.5 packs/day  for 37 years    Types: Cigarettes  . Smokeless tobacco: Not on file  . Alcohol Use: Not on file  . Drug Use: Not on file  . Sexually Active: Not on file    Other Topics Concern  . Not on file   Social History Narrative  . No narrative on file    No family history on file.  Physical Exam: Blood pressure 130/82, pulse 77, temperature 98.1 F (36.7 C), temperature source Oral, resp. rate 16, height 5\' 11"  (1.803 m), weight 240 lb (108.863 kg), SpO2 98.00%.  General: Well developed, well nourished, in no acute distress. HEENT: Normocephalic, atraumatic. Conjunctiva pink, sclera non-icteric. Pupils 2 mm constricting to 1 mm, round, regular, and equally reactive to light and accomodation. EOMI. Internal auditory canal clear. TMs with good cone of light and with dullness right TM.   Nasal mucosa pink. Nares are without discharge. No sinus tenderness. Oral mucosa pink. Dentition. Pharynx without exudate.   Neck: Supple. Trachea midline. No thyromegaly. Full ROM. No lymphadenopathy. Lungs: Clear to auscultation bilaterally without wheezes, rales, or rhonchi. Breathing is of normal effort and unlabored. Cardiovascular: RRR with S1 S2. No murmurs, rubs, or gallops appreciated. Distal pulses 2+ symmetrically. No carotid or abdominal bruits Abdomen: Soft, non-tender, non-distended with normoactive bowel sounds. No hepatosplenomegaly or masses. No rebound/guarding. No CVA tenderness. Without hernias.  Genitourinary:  circumcised male. No penile lesions. Testes descended bilaterally, and smooth without tenderness or masses.  Musculoskeletal: Full range of motion and 5/5 strength throughout. Without swelling, atrophy, tenderness, crepitus, or warmth. Extremities without clubbing, cyanosis, or edema. Calves supple. Skin: Warm and moist without erythema, ecchymosis, wounds, or rash. Neuro: A+Ox3. CN II-XII grossly intact. Moves all extremities spontaneously. Full sensation throughout. Normal gait. DTR 2+ throughout upper and lower extremities. Finger to nose intact. Psych:  Responds to questions appropriately with a normal affect.    Assessment/Plan:  51  y.o. y/o  male here for CPE -I explained to the patient that he cannot take narcotics while he is driving or within 6 hours of driving. I also explained the new regulations regarding insulin and DOT certification period is that he could have a one-year certificate. 1. Type 2 diabetes mellitus  POCT glycosylated hemoglobin (Hb A1C)  2. Otalgia  amoxicillin (AMOXIL) 875 MG tablet  3. High blood sugar  sitaGLIPtan-metformin (JANUMET) 50-500 MG per tablet  4. GERD (gastroesophageal reflux disease)  omeprazole (PRILOSEC) 20 MG capsule  5. Back pain, chronic  HYDROcodone-acetaminophen (LORTAB) 7.5-500 MG per tablet  6. Restless legs  rOPINIRole (REQUIP) 1 MG tablet   I further explained that for chronic pain meds, he will need to see a pain specialist in the future.  Signed, Elvina Sidle, MD 03/20/2012 12:42 PM     Results for orders placed in visit on 03/20/12  POCT GLYCOSYLATED HEMOGLOBIN (HGB A1C)      Component Value Range   Hemoglobin A1C 8.7

## 2012-03-30 ENCOUNTER — Telehealth: Payer: Self-pay

## 2012-03-30 NOTE — Telephone Encounter (Signed)
Received a request for Prior auth for pt's Janumet from pharmacy. Called pt to get more info about h/o meds for DM and pt reported that he was just able to get this Rx filled w/a coupon for $35 and coupon was good for 1 year, so he doesn't need to get a P.A for this med. (Pt confirmed that he was just Dxd w/ DM II in July and was put directly on Janumet and has not tried any other other meds). I faxed a note back to pharmacy w/this information.

## 2012-04-09 ENCOUNTER — Other Ambulatory Visit: Payer: Self-pay | Admitting: Family Medicine

## 2012-04-09 ENCOUNTER — Telehealth: Payer: Self-pay

## 2012-04-09 DIAGNOSIS — R739 Hyperglycemia, unspecified: Secondary | ICD-10-CM

## 2012-04-09 MED ORDER — SITAGLIPTIN PHOS-METFORMIN HCL 50-500 MG PO TABS: 1.0000 | ORAL_TABLET | Freq: Two times a day (BID) | ORAL | Status: DC

## 2012-04-09 NOTE — Telephone Encounter (Signed)
Please clarify dosage on Janumet.

## 2012-04-09 NOTE — Telephone Encounter (Signed)
PT STATES DR Kenyon Ana DIDN'T GIVE HIM ENOUGH JANUMET AND HE NEED TO HAVE A REFILL CALLED IN PLEASE CALL PT AT [phone]    [facility] [place]

## 2012-04-11 ENCOUNTER — Other Ambulatory Visit: Payer: Self-pay | Admitting: Family Medicine

## 2012-04-11 NOTE — Progress Notes (Signed)
Please explain to patient that I he should not take hydrocodone and drive.  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

## 2012-06-04 ENCOUNTER — Telehealth: Payer: Self-pay

## 2012-06-04 NOTE — Telephone Encounter (Signed)
Pt says they do not make the dosage of hydrocodone that he has taken in the past please call patient at [phone]

## 2012-06-04 NOTE — Telephone Encounter (Signed)
Hydrocodone has 3  refills on it at the pharmacy. Please advise if we can call [facility] [place] to change dose? He was written 7.5/500

## 2012-06-06 NOTE — Telephone Encounter (Signed)
Yes.  Ok to change to 7.5/325.

## 2012-06-06 NOTE — Telephone Encounter (Signed)
Called pharmacy to advise. Called patient to advise.

## 2012-06-07 ENCOUNTER — Telehealth: Payer: Self-pay

## 2012-06-07 NOTE — Telephone Encounter (Signed)
Wife states the meds were $35 at [facility] - this year they are $295 for the blood sugar pills ;  Requesting something less expensive   [phone]

## 2012-06-08 ENCOUNTER — Telehealth: Payer: Self-pay | Admitting: Radiology

## 2012-06-08 MED ORDER — SITAGLIPTIN PHOSPHATE 50 MG PO TABS: 50.0000 mg | ORAL_TABLET | Freq: Two times a day (BID) | ORAL | Status: DC

## 2012-06-08 MED ORDER — METFORMIN HCL 500 MG PO TABS: 500.0000 mg | ORAL_TABLET | Freq: Two times a day (BID) | ORAL | Status: DC

## 2012-06-08 NOTE — Telephone Encounter (Signed)
Called her, Thera Flake is costly can we please advise on cost effective alternative? [facility] [place]

## 2012-06-08 NOTE — Telephone Encounter (Signed)
Sent in seperatly per Rhoderick Moody. PA. Sent these

## 2012-06-09 NOTE — Telephone Encounter (Signed)
This has been taken care of - splint Janumet into Januvia and Metformin

## 2012-07-12 ENCOUNTER — Telehealth: Payer: Self-pay

## 2012-07-12 ENCOUNTER — Ambulatory Visit (INDEPENDENT_AMBULATORY_CARE_PROVIDER_SITE_OTHER): Payer: PRIVATE HEALTH INSURANCE | Admitting: Family Medicine

## 2012-07-12 VITALS — BP 159/96 | HR 67 | Temp 98.0°F | Resp 16 | Ht 72.0 in | Wt 235.0 lb

## 2012-07-12 DIAGNOSIS — E785 Hyperlipidemia, unspecified: Secondary | ICD-10-CM

## 2012-07-12 DIAGNOSIS — G894 Chronic pain syndrome: Secondary | ICD-10-CM

## 2012-07-12 DIAGNOSIS — E119 Type 2 diabetes mellitus without complications: Secondary | ICD-10-CM

## 2012-07-12 LAB — COMPREHENSIVE METABOLIC PANEL
ALT: 45 U/L (ref 0–53)
AST: 28 U/L (ref 0–37)
Albumin: 4.5 g/dL (ref 3.5–5.2)
Alkaline Phosphatase: 95 U/L (ref 39–117)
BUN: 13 mg/dL (ref 6–23)
CO2: 27 mEq/L (ref 19–32)
Calcium: 9.2 mg/dL (ref 8.4–10.5)
Chloride: 102 mEq/L (ref 96–112)
Creat: 0.82 mg/dL (ref 0.50–1.35)
Glucose, Bld: 145 mg/dL — ABNORMAL HIGH (ref 70–99)
Potassium: 4.2 mEq/L (ref 3.5–5.3)
Sodium: 135 mEq/L (ref 135–145)
Total Bilirubin: 0.3 mg/dL (ref 0.3–1.2)
Total Protein: 6.7 g/dL (ref 6.0–8.3)

## 2012-07-12 LAB — LIPID PANEL
Cholesterol: 116 mg/dL (ref 0–200)
HDL: 17 mg/dL — ABNORMAL LOW (ref >39)
LDL Cholesterol: 21 mg/dL (ref 0–99)
Total CHOL/HDL Ratio: 6.8 Ratio
Triglycerides: 391 mg/dL — ABNORMAL HIGH (ref <150)
VLDL: 78 mg/dL — ABNORMAL HIGH (ref 0–40)

## 2012-07-12 LAB — POCT GLYCOSYLATED HEMOGLOBIN (HGB A1C): Hemoglobin A1C: 9.5

## 2012-07-12 LAB — GLUCOSE, POCT (MANUAL RESULT ENTRY): POC Glucose: 161 mg/dl — AB (ref 70–99)

## 2012-07-12 MED ORDER — GLIPIZIDE 5 MG PO TABS: ORAL_TABLET | ORAL | Status: DC

## 2012-07-12 MED ORDER — HYDROCODONE-ACETAMINOPHEN 7.5-325 MG PO TABS: 1.0000 | ORAL_TABLET | Freq: Four times a day (QID) | ORAL | Status: DC | PRN

## 2012-07-12 NOTE — Telephone Encounter (Signed)
PATIENT STATES HE WAS IN TO SEE DR. Milus Glazier TODAY TO GET HIS MEDICATIONS REFILLED. HE HAS A PRESCRIPTION FOR HYDROCODONE, BUT HE WOULD LIKE TO KNOW IF HE CAN GET IT FILLED EARLY BECAUSE HE ONLY HAS A FEW LEFT? BEST PHONE [phone] (CELL)  [facility] [place]

## 2012-07-12 NOTE — Patient Instructions (Addendum)
Diabetes and Exercise  Regular exercise is important and can help:   · Control blood glucose (sugar).  · Decrease blood pressure.  ·   · Control blood lipids (cholesterol, triglycerides).  · Improve overall health.  BENEFITS FROM EXERCISE  · Improved fitness.  · Improved flexibility.  · Improved endurance.  · Increased bone density.  · Weight control.  · Increased muscle strength.  · Decreased body fat.  · Improvement of the body's use of insulin, a hormone.  · Increased insulin sensitivity.  · Reduction of insulin needs.  · Reduced stress and tension.  · Helps you feel better.  People with diabetes who add exercise to their lifestyle gain additional benefits, including:  · Weight loss.  · Reduced appetite.  · Improvement of the body's use of blood glucose.  · Decreased risk factors for heart disease:  · Lowering of cholesterol and triglycerides.  · Raising the level of good cholesterol (high-density lipoproteins, HDL).  · Lowering blood sugar.  · Decreased blood pressure.  TYPE 1 DIABETES AND EXERCISE  · Exercise will usually lower your blood glucose.  · If blood glucose is greater than 240 mg/dl, check urine ketones. If ketones are present, do not exercise.  · Location of the insulin injection sites may need to be adjusted with exercise. Avoid injecting insulin into areas of the body that will be exercised. For example, avoid injecting insulin into:  · The arms when playing tennis.  · The legs when jogging. For more information, discuss this with your caregiver.  · Keep a record of:  · Food intake.  · Type and amount of exercise.  · Expected peak times of insulin action.  · Blood glucose levels.  Do this before, during, and after exercise. Review your records with your caregiver. This will help you to develop guidelines for adjusting food intake and insulin amounts.   TYPE 2 DIABETES AND EXERCISE  · Regular physical activity can help control blood glucose.  · Exercise is important because it may:  · Increase the  body's sensitivity to insulin.  · Improve blood glucose control.  · Exercise reduces the risk of heart disease. It decreases serum cholesterol and triglycerides. It also lowers blood pressure.  · Those who take insulin or oral hypoglycemic agents should watch for signs of hypoglycemia. These signs include dizziness, shaking, sweating, chills, and confusion.  · Body water is lost during exercise. It must be replaced. This will help to avoid loss of body fluids (dehydration) or heat stroke.  Be sure to talk to your caregiver before starting an exercise program to make sure it is safe for you. Remember, any activity is better than none.   Document Released: 06/08/2003 Document Revised: 06/10/2011 Document Reviewed: 09/22/2008  Exit[employer]® Patient Information ©2013 [employer].

## 2012-07-12 NOTE — Progress Notes (Signed)
Patient ID: Robert Wise MRN: [id number], DOB: [birth year], 52 y.o. Date of Encounter: 07/12/2012, 8:59 AM  Primary Physician: Elvina Sidle, MD  Chief Complaint: Diabetes follow up  HPI: 52 y.o. year old male with history below presents for follow up of diabetes mellitus. Doing well. No issues or complaints. Taking medications daily without adverse effects. No polydipsia, polyphagia, polyuria, or nocturia.  Blood sugars at home:  improved Last A1C: 8.7  Has stopped the Januvia/janumet/metformin when he found a vitamin supplement was controlling the sugar  Past Medical History  Diagnosis Date  . Diabetes mellitus without complication   . Hyperlipidemia      Home Meds: Prior to Admission medications   Medication Sig Start Date End Date Taking? Authorizing Provider  Blood Glucose Monitoring Suppl (ONE TOUCH ULTRA SYSTEM KIT) W/DEVICE KIT 1 kit by Does not apply route once. 10/25/11  Yes Morrell Riddle, PA-C  fish oil-omega-3 fatty acids 1000 MG capsule Take 2 g by mouth daily.   Yes Historical Provider, MD  glucose blood test strip Use as instructed 10/29/11 10/28/12 Yes Sarah Harvie Bridge, PA-C  glucose monitoring kit (FREESTYLE) monitoring kit 1 each by Does not apply route as needed for other. 10/20/11 10/19/12 Yes Elvina Sidle, MD  Lancets [facility] ULTRASOFT) lancets Use as instructed 10/29/11 10/28/12 Yes Morrell Riddle, PA-C  levothyroxine (SYNTHROID, LEVOTHROID) 150 MCG tablet Take 1 tablet (150 mcg total) by mouth daily. 12/02/11  Yes Elvina Sidle, MD  omeprazole (PRILOSEC) 20 MG capsule Take 1 capsule (20 mg total) by mouth daily. 03/20/12  Yes Elvina Sidle, MD  PARoxetine (PAXIL) 20 MG tablet Take 1 tablet (20 mg total) by mouth every morning. 12/02/11  Yes Elvina Sidle, MD  pravastatin (PRAVACHOL) 40 MG tablet Take 1 tablet (40 mg total) by mouth at bedtime. 12/02/11  Yes Elvina Sidle, MD  rOPINIRole (REQUIP) 1 MG tablet Take 1 tablet (1 mg total) by mouth 3 (three) times  daily. 03/20/12  Yes Elvina Sidle, MD  HYDROcodone-acetaminophen (NORCO) 7.5-325 MG per tablet Take 1 tablet by mouth every 6 (six) hours as needed for pain. 07/12/12   Elvina Sidle, MD    Allergies:  Allergies  Allergen Reactions  . Tricor (Fenofibrate)   . Zocor (Simvastatin)     History   Social History  . Marital Status: Married    Spouse Name: N/A    Number of Children: N/A  . Years of Education: N/A   Occupational History  . Not on file.   Social History Main Topics  . Smoking status: Current Every Day Smoker -- 1.50 packs/day for 37 years    Types: Cigarettes  . Smokeless tobacco: Not on file  . Alcohol Use: No  . Drug Use: No  . Sexually Active: Yes    Birth Control/ Protection: None   Other Topics Concern  . Not on file   Social History Narrative  . No narrative on file     Review of Systems: Constitutional: negative for chills, fever, night sweats, weight changes, or fatigue  HEENT: negative for vision changes, hearing loss, congestion, rhinorrhea, or epistaxis Cardiovascular: negative for chest pain, palpitations, diaphoresis, DOE, orthopnea, or edema Respiratory: negative for hemoptysis, wheezing, shortness of breath, dyspnea, or cough Abdominal: negative for abdominal pain, nausea, vomiting, diarrhea, or constipation Dermatological: negative for rash, erythema, or wounds Neurologic: negative for headache, dizziness, or syncope Renal:  Negative for polyuria, polydipsia, or dysuria All other systems reviewed and are otherwise negative with the exception to those above  and in the HPI.   Physical Exam: Blood pressure 159/96, pulse 67, temperature 98 F (36.7 C), temperature source Oral, resp. rate 16, height 6' (1.829 m), weight 235 lb (106.595 kg), SpO2 96.00%., Body mass index is 31.86 kg/(m^2). General: Well developed, well nourished, in no acute distress. Head: Normocephalic, atraumatic, eyes without discharge, sclera non-icteric, nares are  without discharge. Bilateral auditory canals clear, TM's are without perforation, pearly grey and translucent with reflective cone of light bilaterally. Oral cavity moist, posterior pharynx without exudate, erythema, peritonsillar abscess, or post nasal drip.  Neck: Supple. No thyromegaly. Full ROM. No lymphadenopathy. Lungs: Clear bilaterally to auscultation without wheezes, rales, or rhonchi. Breathing is unlabored. Heart: RRR with S1 S2. No murmurs, rubs, or gallops appreciated. Abdomen: Soft, non-tender, non-distended with normoactive bowel sounds. No hepatosplenomegaly. No rebound/guarding. No obvious abdominal masses. Msk:  Strength and tone normal for age. Extremities/Skin: Warm and dry. No clubbing or cyanosis. No edema. No rashes, wounds, or suspicious lesions. Monofilament exam  normal.  Neuro: Alert and oriented X 3. Moves all extremities spontaneously. Gait is normal. CNII-XII grossly in tact. Psych:  Responds to questions appropriately with a normal affect.   Labs: Results for orders placed in visit on 07/12/12  GLUCOSE, POCT (MANUAL RESULT ENTRY)      Result Value Range   POC Glucose 161 (*) 70 - 99 mg/dl  POCT GLYCOSYLATED HEMOGLOBIN (HGB A1C)      Result Value Range   Hemoglobin A1C 9.5        ASSESSMENT AND PLAN:  52 y.o. year old male with chronic pain syndrome, diabetes Type 2 diabetes mellitus - Plan: POCT glucose (manual entry), POCT glycosylated hemoglobin (Hb A1C), Comprehensive metabolic panel, HYDROcodone-acetaminophen (NORCO) 7.5-325 MG per tablet, Lipid panel  Hyperlipidemia - Plan: Lipid panel  Chronic pain syndrome - Plan: HYDROcodone-acetaminophen (NORCO) 7.5-325 MG per tablet   -recheck 3 months   Signed, Elvina Sidle, MD 07/12/2012 8:59 AM

## 2012-07-13 ENCOUNTER — Other Ambulatory Visit: Payer: Self-pay | Admitting: Family Medicine

## 2012-07-13 DIAGNOSIS — E785 Hyperlipidemia, unspecified: Secondary | ICD-10-CM

## 2012-07-13 MED ORDER — PRAVASTATIN SODIUM 80 MG PO TABS: 80.0000 mg | ORAL_TABLET | Freq: Every day | ORAL | Status: DC

## 2012-08-03 NOTE — Telephone Encounter (Signed)
Yes, Dr Milus Glazier did renew this, but did not close.

## 2012-08-03 NOTE — Telephone Encounter (Signed)
This is still open, has it been completed?

## 2012-08-29 ENCOUNTER — Other Ambulatory Visit: Payer: Self-pay | Admitting: Family Medicine

## 2012-08-31 NOTE — Telephone Encounter (Signed)
Dr. Elbert Ewings - from lab notes in April sounds like you were considering changing statin when pt ran out of pravastatin.  Pharmacy sent RF request, do you want to stick with pravastatin or change to something stronger?

## 2012-09-01 ENCOUNTER — Other Ambulatory Visit: Payer: Self-pay | Admitting: Family Medicine

## 2012-09-01 DIAGNOSIS — F32A Depression, unspecified: Secondary | ICD-10-CM

## 2012-09-01 DIAGNOSIS — F329 Major depressive disorder, single episode, unspecified: Secondary | ICD-10-CM

## 2012-09-01 DIAGNOSIS — E785 Hyperlipidemia, unspecified: Secondary | ICD-10-CM

## 2012-09-01 MED ORDER — PAROXETINE HCL 20 MG PO TABS: 20.0000 mg | ORAL_TABLET | ORAL | Status: DC

## 2012-09-01 MED ORDER — ATORVASTATIN CALCIUM 20 MG PO TABS: 20.0000 mg | ORAL_TABLET | Freq: Every day | ORAL | Status: DC

## 2012-09-14 ENCOUNTER — Other Ambulatory Visit: Payer: Self-pay | Admitting: Family Medicine

## 2012-09-20 ENCOUNTER — Other Ambulatory Visit: Payer: Self-pay | Admitting: Family Medicine

## 2012-11-25 ENCOUNTER — Ambulatory Visit (INDEPENDENT_AMBULATORY_CARE_PROVIDER_SITE_OTHER): Payer: PRIVATE HEALTH INSURANCE | Admitting: Family Medicine

## 2012-11-25 VITALS — BP 122/78 | HR 76 | Temp 98.3°F | Resp 18 | Ht 70.5 in | Wt 233.0 lb

## 2012-11-25 DIAGNOSIS — M549 Dorsalgia, unspecified: Secondary | ICD-10-CM

## 2012-11-25 DIAGNOSIS — E119 Type 2 diabetes mellitus without complications: Secondary | ICD-10-CM

## 2012-11-25 DIAGNOSIS — G4733 Obstructive sleep apnea (adult) (pediatric): Secondary | ICD-10-CM

## 2012-11-25 LAB — POCT CBC
Granulocyte percent: 65.1 %G (ref 37–80)
HCT, POC: 43.9 % (ref 43.5–53.7)
Hemoglobin: 14.4 g/dL (ref 14.1–18.1)
Lymph, poc: 2 (ref 0.6–3.4)
MCH, POC: 30.2 pg (ref 27–31.2)
MCHC: 32.8 g/dL (ref 31.8–35.4)
MCV: 92.1 fL (ref 80–97)
MID (cbc): 0.5 (ref 0–0.9)
MPV: 9.4 fL (ref 0–99.8)
POC Granulocyte: 4.6 (ref 2–6.9)
POC LYMPH PERCENT: 28.4 %L (ref 10–50)
POC MID %: 6.5 %M (ref 0–12)
Platelet Count, POC: 207 10*3/uL (ref 142–424)
RBC: 4.77 M/uL (ref 4.69–6.13)
RDW, POC: 15 %
WBC: 7.1 10*3/uL (ref 4.6–10.2)

## 2012-11-25 LAB — POCT GLYCOSYLATED HEMOGLOBIN (HGB A1C): Hemoglobin A1C: 9.5

## 2012-11-25 LAB — COMPREHENSIVE METABOLIC PANEL
ALT: 22 U/L (ref 0–53)
AST: 21 U/L (ref 0–37)
Albumin: 4.7 g/dL (ref 3.5–5.2)
Alkaline Phosphatase: 87 U/L (ref 39–117)
BUN: 12 mg/dL (ref 6–23)
CO2: 27 mEq/L (ref 19–32)
Calcium: 9.7 mg/dL (ref 8.4–10.5)
Chloride: 99 mEq/L (ref 96–112)
Creat: 0.78 mg/dL (ref 0.50–1.35)
Glucose, Bld: 244 mg/dL — ABNORMAL HIGH (ref 70–99)
Potassium: 4.4 mEq/L (ref 3.5–5.3)
Sodium: 136 mEq/L (ref 135–145)
Total Bilirubin: 0.4 mg/dL (ref 0.3–1.2)
Total Protein: 7.2 g/dL (ref 6.0–8.3)

## 2012-11-25 MED ORDER — METFORMIN HCL 500 MG PO TABS: 500.0000 mg | ORAL_TABLET | Freq: Two times a day (BID) | ORAL | Status: DC

## 2012-11-25 MED ORDER — HYDROCODONE-ACETAMINOPHEN 5-325 MG PO TABS: 1.0000 | ORAL_TABLET | Freq: Four times a day (QID) | ORAL | Status: DC | PRN

## 2012-11-25 MED ORDER — GLIPIZIDE 5 MG PO TABS: ORAL_TABLET | ORAL | Status: DC

## 2012-11-25 NOTE — Progress Notes (Signed)
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 He's unable to afford the [employer]. Instead he is taking Lipitor 3 times a day. His blood sugars been running around 200. He also needs a refill on his pain medicine for chronic back pain.  Objective: No acute distress HEENT: Unremarkable Chest: Clear Heart: Regular no murmur Abdomen: Soft nontender Gait: Normal Skin: Unremarkable Results for orders placed in visit on 11/25/12  POCT CBC      Result Value Range   WBC 7.1  4.6 - 10.2 K/uL   Lymph, poc 2.0  0.6 - 3.4   POC LYMPH PERCENT 28.4  10 - 50 %L   MID (cbc) 0.5  0 - 0.9   POC MID % 6.5  0 - 12 %M   POC Granulocyte 4.6  2 - 6.9   Granulocyte percent 65.1  37 - 80 %G   RBC 4.77  4.69 - 6.13 M/uL   Hemoglobin 14.4  14.1 - 18.1 g/dL   HCT, POC 40.9  81.1 - 53.7 %   MCV 92.1  80 - 97 fL   MCH, POC 30.2  27 - 31.2 pg   MCHC 32.8  31.8 - 35.4 g/dL   RDW, POC 91.4     Platelet Count, POC 207  142 - 424 K/uL   MPV 9.4  0 - 99.8 fL  POCT GLYCOSYLATED HEMOGLOBIN (HGB A1C)      Result Value Range   Hemoglobin A1C 9.5     Assessment: We need to do better on the glucose control. Patient understands this he also understands that he needs to get his pain medicine down or he'll lose his ability to drive a truck.  Plan: Type 2 diabetes mellitus - Plan: glipiZIDE (GLUCOTROL) 5 MG tablet, metFORMIN (GLUCOPHAGE) 500 MG tablet, Ambulatory referral to Ophthalmology, Comprehensive metabolic panel, POCT glycosylated hemoglobin (Hb A1C)  Obstructive sleep apnea - Plan: Nocturnal polysomnography (NPSG), POCT CBC  Back pain - Plan: HYDROcodone-acetaminophen (NORCO) 5-325 MG per tablet  Signed, Elvina Sidle, MD

## 2012-11-26 ENCOUNTER — Telehealth: Payer: Self-pay

## 2012-11-26 NOTE — Telephone Encounter (Signed)
pts wife Robert Wise is returning a call back for lab results. She would like a call back at [phone]

## 2012-12-10 ENCOUNTER — Encounter: Payer: Self-pay | Admitting: Neurology

## 2012-12-15 ENCOUNTER — Ambulatory Visit (INDEPENDENT_AMBULATORY_CARE_PROVIDER_SITE_OTHER): Payer: PRIVATE HEALTH INSURANCE | Admitting: Neurology

## 2012-12-15 ENCOUNTER — Telehealth: Payer: Self-pay | Admitting: Neurology

## 2012-12-15 ENCOUNTER — Encounter: Payer: Self-pay | Admitting: Neurology

## 2012-12-15 VITALS — BP 125/79 | HR 69 | Ht 72.0 in | Wt 237.5 lb

## 2012-12-15 DIAGNOSIS — G2581 Restless legs syndrome: Secondary | ICD-10-CM

## 2012-12-15 DIAGNOSIS — F17200 Nicotine dependence, unspecified, uncomplicated: Secondary | ICD-10-CM

## 2012-12-15 DIAGNOSIS — E119 Type 2 diabetes mellitus without complications: Secondary | ICD-10-CM

## 2012-12-15 DIAGNOSIS — G4733 Obstructive sleep apnea (adult) (pediatric): Secondary | ICD-10-CM

## 2012-12-15 HISTORY — DX: Obstructive sleep apnea (adult) (pediatric): G47.33

## 2012-12-15 NOTE — Progress Notes (Signed)
Subjective:    Patient ID: Robert Wise is a 52 y.o. male.  HPI  Robert Foley, MD, PhD [facility] [address] [address] [place], [place] [address]  Dear Dr. Milus Glazier,   I saw your patient, Robert Wise, upon your kind request in my neurologic clinic today for initial consultation of his sleep disorder, in particular concern for obstructive sleep apnea. The patient is unaccompanied today. 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 He has been taking Norco for his chronic back pain and he recently counseled him on reducing his narcotic pain medication. He was diagnosed with severe OSA over 10 years ago and has been using a CPAP. He did not bring his machine and I do not have prior sleep study results. He does not know where he had the study done and does not recall what pressure he is on, he uses a nasal pillows mask, medium, he believes. He has mild RLS Sx and was started on Requip, but takes it very infrequently, as he does not have Sx every night. His wife has reported leg kicking in his sleep. He goes to bed around 10 PM and falls asleep within 5 minutes. He has to wake up at 4 AM. He still does not feel very rested, despite being compliant with CPAP. When he first started using CPAP many years, he felt improved. He has EDS and his ESS is 13/24 today. He drives a tanker truck. He has not fallen asleep while driving. He smokes 1 1/2 ppd. He quit EtOH on 08/14/11. He has a Hx of heavy drinking before. His sister has OSA and has a CPAP. He denies morning HAs. He wakes up on an average 3 times/night and has to go to the bathroom each time.  He is a restless sleeper and in the morning, the bed is quite disheveled.   He denies cataplexy, sleep paralysis, hypnagogic or  hypnopompic hallucinations, or sleep attacks. He does not report any vivid dreams, nightmares, dream enactments, or parasomnias, such as sleep talking or sleep walking. The patient has not had a sleep study or a home sleep test in over 10 years.  He consumes 6 to 8 caffeinated beverages per day, usually in the form of coffee, and energy sodas.  His bedroom is usually dark and cool. There is a TV in the bedroom and usually it is not on at night.  He has lost wt in comparison to when he was first diagnosed with OSA.  His Past Medical History Is Significant For: Past Medical History  Diagnosis Date  . Diabetes mellitus without complication   . Hyperlipidemia   . OSA (obstructive sleep apnea) 12/15/2012    His Past Surgical History Is Significant For: Past Surgical History  Procedure Laterality Date  . Ankle surgery Left 2006    His Family History Is Significant For: No family history on file.  His Social History Is Significant For: History   Social History  . Marital Status: Married    Spouse Name: Robert Wise    Number of Children: 3  . Years of Education: 11.5   Occupational History  .      Driver   Social History Main Topics  . Smoking status: Current Every Day Smoker -- 1.50 packs/day for 37 years  Types: Cigarettes  . Smokeless tobacco: Never Used  . Alcohol Use: No     Comment: quit: 08/14/2011  . Drug Use: No  . Sexual Activity: Yes    Birth Control/ Protection: None   Other Topics Concern  . Not on file   Social History Narrative   Patient lives at home with family.    Caffeine Use: 1 pot of coffee daily    His Allergies Are:  Allergies  Allergen Reactions  . Tricor [Fenofibrate]   . Zocor [Simvastatin]   :   His Current Medications Are:  Outpatient Encounter Prescriptions as of 12/15/2012  Medication Sig Dispense Refill  . atorvastatin (LIPITOR) 20 MG tablet Take 1 tablet (20 mg total) by mouth daily.  90 tablet  3  . Blood Glucose Monitoring Suppl  (ONE TOUCH ULTRA SYSTEM KIT) W/DEVICE KIT 1 kit by Does not apply route once.  1 each  0  . fish oil-omega-3 fatty acids 1000 MG capsule Take 2 g by mouth daily.      Marland Kitchen HYDROcodone-acetaminophen (NORCO) 5-325 MG per tablet Take 1 tablet by mouth every 6 (six) hours as needed for pain.  90 tablet  3  . JANUMET 50-500 MG per tablet Take 1 tablet by mouth daily.      Marland Kitchen levothyroxine (SYNTHROID, LEVOTHROID) 150 MCG tablet Take 1 tablet (150 mcg total) by mouth daily.  90 tablet  3  . omeprazole (PRILOSEC) 20 MG capsule TAKE 1 CAPSULE (20 MG TOTAL) BY MOUTH DAILY.  90 capsule  3  . PARoxetine (PAXIL) 20 MG tablet Take 1 tablet (20 mg total) by mouth every morning.  90 tablet  3  . [DISCONTINUED] glipiZIDE (GLUCOTROL) 5 MG tablet Once daily  90 tablet  3  . [DISCONTINUED] levothyroxine (SYNTHROID, LEVOTHROID) 150 MCG tablet Take 1 tablet (150 mcg total) by mouth daily before breakfast. PATIENT NEEDS OFFICE VISIT FOR ADDITIONAL REFILLS  90 tablet  0  . [DISCONTINUED] metFORMIN (GLUCOPHAGE) 500 MG tablet Take 1 tablet (500 mg total) by mouth 2 (two) times daily with a meal.  180 tablet  3  . [DISCONTINUED] omeprazole (PRILOSEC) 20 MG capsule Take 1 capsule (20 mg total) by mouth daily.  90 capsule  3  . [DISCONTINUED] pravastatin (PRAVACHOL) 80 MG tablet Take 1 tablet (80 mg total) by mouth daily.  90 tablet  3  . [DISCONTINUED] rOPINIRole (REQUIP) 1 MG tablet Take 1 tablet (1 mg total) by mouth 3 (three) times daily.  30 tablet  5   No facility-administered encounter medications on file as of 12/15/2012.  :  Review of Systems:  Out of a complete 14 point review of systems, all are reviewed and negative with the exception of these symptoms as listed below:  Review of Systems  Respiratory:       Snoring  Neurological:       Snoring    Objective:  Neurologic Exam  Physical Exam Physical Examination:   Filed Vitals:   12/15/12 0912  BP: 125/79  Pulse: 69    General Examination: The patient  is a very pleasant 52 y.o. male in no acute distress. He appears well-developed and well-nourished and adequately groomed.   HEENT: Normocephalic, atraumatic, pupils are equal, round and reactive to light and accommodation. Funduscopic exam is normal with sharp disc margins noted. Extraocular tracking is good without limitation to gaze excursion or nystagmus noted. Normal smooth pursuit is noted. Hearing is grossly intact. Tympanic membranes are clear bilaterally. Face is symmetric with  normal facial animation and normal facial sensation. Speech is clear with no dysarthria noted. There is no hypophonia. There is no lip, neck/head, jaw or voice tremor. Neck is supple with full range of passive and active motion. There are no carotid bruits on auscultation. Oropharynx exam reveals: mild mouth dryness, adequate dental hygiene and moderate airway crowding, due to larger tongue, elongated uvula. Mallampati is class I. Tongue protrudes centrally and palate elevates symmetrically. Tonsils are 1+ on the L, 1 to 2+ on the R. Neck size is 17.25 inches.   Chest: Clear to auscultation without wheezing, rhonchi or crackles noted.  Heart: S1+S2+0, regular and normal without murmurs, rubs or gallops noted.   Abdomen: Soft, non-tender and non-distended with normal bowel sounds appreciated on auscultation.  Extremities: There is no pitting edema in the distal lower extremities bilaterally. Pedal pulses are intact.  Skin: Warm and dry without trophic changes noted. There are no varicose veins.  Musculoskeletal: exam reveals no obvious joint deformities, tenderness or joint swelling or erythema.   Neurologically:  Mental status: The patient is awake, alert and oriented in all 4 spheres. His memory, attention, language and knowledge are appropriate. There is no aphasia, agnosia, apraxia or anomia. Speech is clear with normal prosody and enunciation. Thought process is linear. Mood is congruent and affect is normal.   Cranial nerves are as described above under HEENT exam. In addition, shoulder shrug is normal with equal shoulder height noted. Motor exam: Normal bulk, strength and tone is noted. There is no drift, tremor or rebound. Romberg is negative. Reflexes are 2+ throughout. Toes are downgoing bilaterally. Fine motor skills are intact with normal finger taps, normal hand movements, normal rapid alternating patting, normal foot taps and normal foot agility.  Cerebellar testing shows no dysmetria or intention tremor on finger to nose testing. Heel to shin is unremarkable bilaterally. There is no truncal or gait ataxia.  Sensory exam is intact to light touch, pinprick, vibration, temperature sense and proprioception in the upper and lower extremities.  Gait, station and balance are unremarkable. No veering to one side is noted. No leaning to one side is noted. Posture is age-appropriate and stance is narrow based. No problems turning are noted. He turns en bloc. Tandem walk is unremarkable. Intact toe and heel stance is noted.               Assessment and Plan:   In summary, Robert Wise is a very pleasant 52 y.o.-year old male with a prior Dx of obstructive sleep apnea (OSA), who presents for re-evaluation as he has residual EDS and his machine needs to be replaced. His . I had a long chat with the patient about my findings and the diagnosis, its prognosis and treatment options. We talked about medical treatments and non-pharmacological approaches. I explained in particular the risks and ramifications of untreated moderate to severe OSA, especially with respect to developing cardiovascular disease down the Road, including congestive heart failure, difficult to treat hypertension, cardiac arrhythmias, or stroke. Even type 2 diabetes has in part been linked to untreated OSA. We talked about smoking cessation and trying to maintain a healthy lifestyle in general, as well as the importance of weight control. I  encouraged the patient to eat healthy, exercise daily and keep well hydrated, to keep a scheduled bedtime and wake time routine, to not skip any meals and eat healthy snacks in between meals.  I recommended the following at this time: sleep study with potential positive airway  pressure titration.  I explained the sleep test procedure to the patient and also outlined possible surgical and non-surgical treatment options of OSA, including the use of a custom-made dental device, upper airway surgical options, such as pillar implants, radiofrequency surgery, tongue base surgery, and UPPP. I also explained the CPAP treatment option to the patient, who indicated that he would be willing to continue using CPAP. I explained the importance of being compliant with PAP treatment, not only for insurance purposes but primarily to improve His symptoms, and for the patient's long term health benefit, including to reduce His cardiovascular risks. He may need a change in his CPAP pressure settings. He reports overall having lost weight since his original Dx.  I answered all his questions today and the patient was in agreement. I would like to see him back after the sleep study is completed and encouraged him to call with any interim questions, concerns, problems or updates.   Thank you very much for allowing me to participate in the care of this nice patient. If I can be of any further assistance to you please do not hesitate to call me at [phone].  Sincerely,   Robert Foley, MD, PhD

## 2012-12-15 NOTE — Telephone Encounter (Signed)
Due to pt's high deductible he has opted to wait before proceeding with a sleep study to see if he can satisfy more of the deductible.  He will call for scheduling within the next 2 to 3 months.

## 2012-12-15 NOTE — Patient Instructions (Addendum)
You have obstructive sleep apnea or OSA, but it has been over 10 years, but I think we should proceed with a sleep study to determine how severe your OSA is and in order to get you a new machine. Please remember, the risks and ramifications of moderate to severe obstructive sleep apnea or OSA are: Cardiovascular disease, including congestive heart failure, stroke, difficult to control hypertension, arrhythmias, and even type 2 diabetes has been linked to untreated OSA. Sleep apnea causes disruption of sleep and sleep deprivation in most cases, which, in turn, can cause recurrent headaches, problems with memory, mood, concentration, focus, and vigilance. Most people with untreated sleep apnea report excessive daytime sleepiness, which can affect their ability to drive. Please do not drive if you feel sleepy.  I will see you back after your sleep study to go over the test results and where to go from there. We will call you after your sleep study and to set up an appointment at the time.   Please remember to bring your CPAP machine and mask for comparison for your sleep study.

## 2012-12-21 ENCOUNTER — Other Ambulatory Visit: Payer: Self-pay | Admitting: Family Medicine

## 2013-01-05 ENCOUNTER — Other Ambulatory Visit: Payer: Self-pay | Admitting: Family Medicine

## 2013-01-05 ENCOUNTER — Telehealth: Payer: Self-pay

## 2013-01-05 DIAGNOSIS — M549 Dorsalgia, unspecified: Secondary | ICD-10-CM

## 2013-01-05 MED ORDER — HYDROCODONE-ACETAMINOPHEN 5-325 MG PO TABS: 1.0000 | ORAL_TABLET | Freq: Four times a day (QID) | ORAL | Status: DC | PRN

## 2013-01-05 NOTE — Telephone Encounter (Signed)
PT STATES HE RECEIVED A LETTER FROM [facility] STATING THEY CAN'T REFILL HIS HYDROCODONE ANYMORE SO HE ISN'T SURE WHO IS GOING TO BE DURING IT PLEASE CALL [phone]

## 2013-01-05 NOTE — Telephone Encounter (Signed)
Spoke with pt, explained to him that Hydrocodone would be refilled only with a Rx in hand to take to his pharmacy. Pt understood. He needs a refill on this. Call when ready to pick up.

## 2013-01-22 ENCOUNTER — Telehealth: Payer: Self-pay | Admitting: Family Medicine

## 2013-01-23 NOTE — Telephone Encounter (Signed)
Dr L, pt recently came in for RF on all of his meds, but he hasn't had a TSH for over a year. Do you want to give him RFs on levothyroxine? If so, how many mos?

## 2013-01-24 NOTE — Telephone Encounter (Signed)
Please ask patient to come in for TSH.

## 2013-01-25 ENCOUNTER — Telehealth: Payer: Self-pay | Admitting: Radiology

## 2013-01-25 DIAGNOSIS — M549 Dorsalgia, unspecified: Secondary | ICD-10-CM

## 2013-01-25 NOTE — Telephone Encounter (Signed)
Yes, he needs a TSH

## 2013-01-25 NOTE — Telephone Encounter (Signed)
LMOM for pt that we sent in 1 mos RF but asked that he RTC in the next month so that Dr L can run a TSH test.

## 2013-01-25 NOTE — Telephone Encounter (Signed)
Okay to call in one month refill

## 2013-01-25 NOTE — Telephone Encounter (Signed)
Patient wants renewal on Hydrocodone please advise

## 2013-01-25 NOTE — Telephone Encounter (Signed)
We can no longer call this in. Patient has to pick up RX.

## 2013-01-26 MED ORDER — HYDROCODONE-ACETAMINOPHEN 5-325 MG PO TABS: 1.0000 | ORAL_TABLET | Freq: Four times a day (QID) | ORAL | Status: DC | PRN

## 2013-01-26 NOTE — Telephone Encounter (Signed)
Ok, will print,you sign tomorrow.

## 2013-01-26 NOTE — Addendum Note (Signed)
Addended byCaffie Damme on: 01/26/2013 08:34 AM   Modules accepted: Orders

## 2013-01-26 NOTE — Telephone Encounter (Signed)
I won't be in until tomorrow.

## 2013-01-28 ENCOUNTER — Other Ambulatory Visit: Payer: Self-pay | Admitting: Family Medicine

## 2013-01-28 DIAGNOSIS — M549 Dorsalgia, unspecified: Secondary | ICD-10-CM

## 2013-01-28 MED ORDER — HYDROCODONE-ACETAMINOPHEN 5-325 MG PO TABS: 1.0000 | ORAL_TABLET | Freq: Three times a day (TID) | ORAL | Status: DC | PRN

## 2013-02-24 ENCOUNTER — Other Ambulatory Visit: Payer: Self-pay | Admitting: Family Medicine

## 2013-02-24 NOTE — Telephone Encounter (Signed)
[facility] sent [facility]. Patient called stating that he knows he needs an OV but can't make it out here anytime soon. Would like an extension on his pills and will try to come in a the end of December for his DOT PE and his OV.  [phone]

## 2013-03-02 ENCOUNTER — Telehealth: Payer: Self-pay

## 2013-03-02 DIAGNOSIS — M549 Dorsalgia, unspecified: Secondary | ICD-10-CM

## 2013-03-02 MED ORDER — HYDROCODONE-ACETAMINOPHEN 5-325 MG PO TABS: 1.0000 | ORAL_TABLET | Freq: Two times a day (BID) | ORAL | Status: DC | PRN

## 2013-03-02 NOTE — Telephone Encounter (Signed)
Medicine called in.  i am tapering his hydrocodone as explained to him previously

## 2013-03-02 NOTE — Telephone Encounter (Signed)
Patient would like a refill on his hydrocodone

## 2013-03-03 NOTE — Telephone Encounter (Signed)
Can not be called, have advised him this is ready for pick up

## 2013-03-16 ENCOUNTER — Ambulatory Visit: Payer: Self-pay | Admitting: Family Medicine

## 2013-03-16 ENCOUNTER — Encounter: Payer: Self-pay | Admitting: Family Medicine

## 2013-03-16 VITALS — BP 142/86 | HR 77 | Temp 98.1°F | Resp 16 | Ht 70.0 in | Wt 237.6 lb

## 2013-03-16 DIAGNOSIS — M549 Dorsalgia, unspecified: Secondary | ICD-10-CM

## 2013-03-16 DIAGNOSIS — E119 Type 2 diabetes mellitus without complications: Secondary | ICD-10-CM

## 2013-03-16 DIAGNOSIS — E039 Hypothyroidism, unspecified: Secondary | ICD-10-CM

## 2013-03-16 DIAGNOSIS — Z0289 Encounter for other administrative examinations: Secondary | ICD-10-CM

## 2013-03-16 LAB — POCT GLYCOSYLATED HEMOGLOBIN (HGB A1C): Hemoglobin A1C: 7.8

## 2013-03-16 MED ORDER — LEVOTHYROXINE SODIUM 150 MCG PO TABS: 150.0000 ug | ORAL_TABLET | Freq: Every day | ORAL | Status: DC

## 2013-03-16 MED ORDER — HYDROCODONE-ACETAMINOPHEN 5-325 MG PO TABS: 1.0000 | ORAL_TABLET | Freq: Two times a day (BID) | ORAL | Status: DC | PRN

## 2013-03-16 NOTE — Progress Notes (Signed)
   Subjective:    Patient ID: Merlene Morse, male    DOB: 04-02-1960, 52 y.o.   MRN: [id number]  HPI  Here for check on thyroid Patient uses CPAP qhs, doesn't have data  Review of Systems    see DOT sheet Objective:   Physical Exam  U/A specific gravity: 1.010 Blood neg Protein neg Sugar negrr  BP 130/78 HEENT: unremarkable Neck:  Good ROM Chest:  Clear Heart: reg, no murmur Abdomen:  Soft, nontender without HSM or mass Ext: good ROM, no edema Skin:  Clear Genitalia normal circ male with no hernia Back without scoliosis Results for orders placed in visit on 03/16/13  POCT GLYCOSYLATED HEMOGLOBIN (HGB A1C)      Result Value Range   Hemoglobin A1C 7.8          Assessment & Plan:  We need more information about the CPAP usage of origin issues the DOT card. Patient has come back with his sleep apnea machine so I can look at his usage. He says her come back this weekend.  Signed, Elvina Sidle

## 2013-03-17 LAB — TSH: TSH: 3.782 u[IU]/mL (ref 0.350–4.500)

## 2013-04-05 ENCOUNTER — Other Ambulatory Visit: Payer: Self-pay | Admitting: Family Medicine

## 2013-04-05 NOTE — Telephone Encounter (Signed)
Dr L, do you RX Janumet for pt? At 8/27 OV it is not on pt's med list and not on plan for visit. It is on pt's med list at most recent OV but don't see Rxd by you anywhere?

## 2013-05-03 ENCOUNTER — Telehealth: Payer: Self-pay

## 2013-05-03 DIAGNOSIS — M549 Dorsalgia, unspecified: Secondary | ICD-10-CM

## 2013-05-03 NOTE — Telephone Encounter (Signed)
Patient's wife states that patient needs a refill on hydrocodone.   [phone]

## 2013-05-06 NOTE — Telephone Encounter (Signed)
Pt calling to check status of refill.   Please call: [phone]  bf

## 2013-05-06 NOTE — Telephone Encounter (Signed)
LMOM that message has been sent to Dr L for review and we will call him after he addresses the request for RF.

## 2013-05-06 NOTE — Telephone Encounter (Signed)
I am out of town for three weeks.  He may have one month of norco 5-325 #60 for bid use (I have been tapering him this year)  Next month he can have one qd for a month.

## 2013-05-07 MED ORDER — HYDROCODONE-ACETAMINOPHEN 5-325 MG PO TABS: 1.0000 | ORAL_TABLET | Freq: Two times a day (BID) | ORAL | Status: DC | PRN

## 2013-05-07 MED ORDER — HYDROCODONE-ACETAMINOPHEN 5-325 MG PO TABS: 1.0000 | ORAL_TABLET | Freq: Every day | ORAL | Status: DC | PRN

## 2013-05-07 NOTE — Telephone Encounter (Signed)
LMOM on VM for pt that Rx is ready and inst's for taper.

## 2013-05-07 NOTE — Telephone Encounter (Signed)
I have written for this as Dr. Milus GlazierLauenstein directed.  I have printed an rx to be filled on or after 06/04/13 for 1 qd (#30) as directed by Dr. Milus GlazierLauenstein.  Pt to follow up with Dr. Elbert EwingsL as planned

## 2013-05-07 NOTE — Telephone Encounter (Signed)
Can you please write this for Dr Milus GlazierLauenstein?

## 2013-06-08 ENCOUNTER — Telehealth: Payer: Self-pay

## 2013-06-08 DIAGNOSIS — E119 Type 2 diabetes mellitus without complications: Secondary | ICD-10-CM

## 2013-06-08 NOTE — Telephone Encounter (Signed)
Pharm called to stated that pt needs a PA on Janumet and he is out of medication. I advised that I did not receive the fax and she gave me ins info on phone. [employer] [phone]. ID# [id number]. Pt has been on Janumet since 12/2012 and was on Metformin before that.  Verified w/Dr L that pt is supposed to be taking the Janumet twice daily. Sent in Corrected Rx and completed PA on covermymeds.

## 2013-06-14 MED ORDER — SAXAGLIPTIN-METFORMIN ER 5-1000 MG PO TB24: 1.0000 | ORAL_TABLET | Freq: Every day | ORAL | Status: DC

## 2013-06-14 NOTE — Telephone Encounter (Signed)
kombiglyze ordered

## 2013-06-14 NOTE — Telephone Encounter (Signed)
PA was denied for Janumet bc pt has not tried/failed preferred alternative Kombiglyze. Dr L, do you want to Rx Kombiglyze?

## 2013-06-28 ENCOUNTER — Telehealth: Payer: Self-pay

## 2013-06-28 NOTE — Telephone Encounter (Signed)
Med refill   HYDROcodone-acetaminophen (NORCO) 5-325 MG per tablet   [phone]

## 2013-06-29 ENCOUNTER — Other Ambulatory Visit: Payer: Self-pay | Admitting: Family Medicine

## 2013-06-29 DIAGNOSIS — M549 Dorsalgia, unspecified: Secondary | ICD-10-CM

## 2013-06-29 MED ORDER — HYDROCODONE-ACETAMINOPHEN 5-325 MG PO TABS: 1.0000 | ORAL_TABLET | Freq: Two times a day (BID) | ORAL | Status: DC | PRN

## 2013-06-30 NOTE — Telephone Encounter (Signed)
Notified pt on VM that Rx ready. 

## 2013-07-26 ENCOUNTER — Telehealth: Payer: Self-pay

## 2013-07-26 DIAGNOSIS — M549 Dorsalgia, unspecified: Secondary | ICD-10-CM

## 2013-07-26 NOTE — Telephone Encounter (Signed)
Refill hydrocodone   [phone]

## 2013-07-27 MED ORDER — HYDROCODONE-ACETAMINOPHEN 5-325 MG PO TABS: 1.0000 | ORAL_TABLET | Freq: Two times a day (BID) | ORAL | Status: DC | PRN

## 2013-07-27 NOTE — Telephone Encounter (Signed)
Notified pt ready. 

## 2013-08-19 ENCOUNTER — Other Ambulatory Visit: Payer: Self-pay | Admitting: Family Medicine

## 2013-08-23 ENCOUNTER — Telehealth: Payer: Self-pay

## 2013-08-23 DIAGNOSIS — M549 Dorsalgia, unspecified: Secondary | ICD-10-CM

## 2013-08-23 MED ORDER — HYDROCODONE-ACETAMINOPHEN 5-325 MG PO TABS: 1.0000 | ORAL_TABLET | Freq: Two times a day (BID) | ORAL | Status: DC | PRN

## 2013-08-23 NOTE — Telephone Encounter (Signed)
Refill on Hydrocodone  [phone]

## 2013-08-24 NOTE — Telephone Encounter (Signed)
Notified pt ready. 

## 2013-09-21 ENCOUNTER — Other Ambulatory Visit: Payer: Self-pay | Admitting: Physician Assistant

## 2013-09-22 ENCOUNTER — Telehealth: Payer: Self-pay

## 2013-09-22 NOTE — Telephone Encounter (Signed)
Patient needs refill on hydrocodone.  °

## 2013-09-24 ENCOUNTER — Ambulatory Visit (INDEPENDENT_AMBULATORY_CARE_PROVIDER_SITE_OTHER): Payer: BC Managed Care – PPO | Admitting: Family Medicine

## 2013-09-24 ENCOUNTER — Ambulatory Visit (INDEPENDENT_AMBULATORY_CARE_PROVIDER_SITE_OTHER): Payer: BC Managed Care – PPO

## 2013-09-24 VITALS — BP 118/84 | HR 63 | Temp 97.8°F | Resp 16 | Ht 71.25 in | Wt 227.6 lb

## 2013-09-24 DIAGNOSIS — F3289 Other specified depressive episodes: Secondary | ICD-10-CM

## 2013-09-24 DIAGNOSIS — R5381 Other malaise: Secondary | ICD-10-CM

## 2013-09-24 DIAGNOSIS — F329 Major depressive disorder, single episode, unspecified: Secondary | ICD-10-CM

## 2013-09-24 DIAGNOSIS — T148 Other injury of unspecified body region: Secondary | ICD-10-CM

## 2013-09-24 DIAGNOSIS — W57XXXA Bitten or stung by nonvenomous insect and other nonvenomous arthropods, initial encounter: Secondary | ICD-10-CM

## 2013-09-24 DIAGNOSIS — I498 Other specified cardiac arrhythmias: Secondary | ICD-10-CM

## 2013-09-24 DIAGNOSIS — R5383 Other fatigue: Secondary | ICD-10-CM

## 2013-09-24 DIAGNOSIS — F32A Depression, unspecified: Secondary | ICD-10-CM

## 2013-09-24 DIAGNOSIS — R001 Bradycardia, unspecified: Secondary | ICD-10-CM

## 2013-09-24 DIAGNOSIS — M545 Low back pain, unspecified: Secondary | ICD-10-CM

## 2013-09-24 LAB — COMPREHENSIVE METABOLIC PANEL
ALT: 18 U/L (ref 0–53)
AST: 16 U/L (ref 0–37)
Albumin: 4.7 g/dL (ref 3.5–5.2)
Alkaline Phosphatase: 85 U/L (ref 39–117)
BUN: 11 mg/dL (ref 6–23)
CO2: 27 mEq/L (ref 19–32)
Calcium: 9.5 mg/dL (ref 8.4–10.5)
Chloride: 103 mEq/L (ref 96–112)
Creat: 0.83 mg/dL (ref 0.50–1.35)
Glucose, Bld: 190 mg/dL — ABNORMAL HIGH (ref 70–99)
Potassium: 4.5 mEq/L (ref 3.5–5.3)
Sodium: 140 mEq/L (ref 135–145)
Total Bilirubin: 0.5 mg/dL (ref 0.2–1.2)
Total Protein: 7.2 g/dL (ref 6.0–8.3)

## 2013-09-24 LAB — GLUCOSE, POCT (MANUAL RESULT ENTRY): POC Glucose: 188 mg/dl — AB (ref 70–99)

## 2013-09-24 LAB — CBC
HCT: 44.2 % (ref 39.0–52.0)
Hemoglobin: 15.5 g/dL (ref 13.0–17.0)
MCH: 29.3 pg (ref 26.0–34.0)
MCHC: 35.1 g/dL (ref 30.0–36.0)
MCV: 83.6 fL (ref 78.0–100.0)
Platelets: 208 10*3/uL (ref 150–400)
RBC: 5.29 MIL/uL (ref 4.22–5.81)
RDW: 15.2 % (ref 11.5–15.5)
WBC: 8.4 10*3/uL (ref 4.0–10.5)

## 2013-09-24 LAB — TSH: TSH: 0.492 u[IU]/mL (ref 0.350–4.500)

## 2013-09-24 MED ORDER — DOXYCYCLINE HYCLATE 100 MG PO TABS: 100.0000 mg | ORAL_TABLET | Freq: Two times a day (BID) | ORAL | Status: DC

## 2013-09-24 MED ORDER — HYDROCODONE-ACETAMINOPHEN 5-325 MG PO TABS
1.0000 | ORAL_TABLET | Freq: Two times a day (BID) | ORAL | Status: DC | PRN
Start: 2013-09-24 — End: 2013-10-20

## 2013-09-24 NOTE — Progress Notes (Signed)
Subjective:    Patient ID: Robert Wise, male    DOB: [birth year], 53 y.o.   MRN: [id number]  HPI Chief Complaint  Patient presents with   Fatigue    extreme weakness, x1day, dizziness yesterday   bitten by two ticks in the past month   This chart was scribed for Robert Haber, MD by Thea Alken, ED Scribe. This patient was seen in room 14 and the patient's care was started at 9:02 AM.  HPI Comments: Robert Wise is a 53 y.o. male who presents to the [facility] complaining of fatigue x 1 day. Pt states he felt hot, disoriented, and weak 1 day ago after work. States he went home to lay down but woke up the same symptoms this morning. Pt has been taking his tyroid medication. Per wife she has noticed depression for the past couple of weeks. Pt denies CP, SOB, cough, diarrhea, and nausea.  Pt has removed 2 ticks over the past month. He reports recent heat rash.  Pt has been drinking fluid fine.   Patient Active Problem List   Diagnosis Date Noted   OSA (obstructive sleep apnea) 12/15/2012   Past Medical History  Diagnosis Date   Diabetes mellitus without complication    Hyperlipidemia    OSA (obstructive sleep apnea) 12/15/2012   Allergies  Allergen Reactions   Tricor [Fenofibrate]    Zocor [Simvastatin]    Prior to Admission medications   Medication Sig Start Date End Date Taking? Authorizing Provider  atorvastatin (LIPITOR) 20 MG tablet Take 1 tablet (20 mg total) by mouth daily. PATIENT NEEDS OFFICE VISIT FOR ADDITIONAL REFILLS   Yes Robert Haber, MD  Blood Glucose Monitoring Suppl (ONE TOUCH ULTRA SYSTEM KIT) W/DEVICE KIT 1 kit by Does not apply route once. 10/25/11  Yes [name], PA-C  fish oil-omega-3 fatty acids 1000 MG capsule Take 2 g by mouth daily.   Yes Historical Provider, MD  HYDROcodone-acetaminophen (NORCO) 5-325 MG per tablet Take 1 tablet by mouth every 12 (twelve) hours as needed. 08/23/13  Yes Robert Haber, MD    JANUMET 50-500 MG per tablet TAKE 1 TABLET EVERY DAY 04/05/13  Yes Robert Haber, MD  levothyroxine (SYNTHROID, LEVOTHROID) 150 MCG tablet Take 1 tablet (150 mcg total) by mouth daily. 03/16/13  Yes Robert Haber, MD  omeprazole (PRILOSEC) 20 MG capsule TAKE 1 CAPSULE (20 MG TOTAL) BY MOUTH DAILY. 09/21/13  Yes [name], PA-C  PARoxetine (PAXIL) 20 MG tablet Take 1 tablet (20 mg total) by mouth daily. PATIENT NEEDS OFFICE VISIT FOR ADDITIONAL REFILLS   Yes Robert Haber, MD  Saxagliptin-Metformin (KOMBIGLYZE XR) 07-998 MG TB24 Take 1 tablet by mouth daily. 06/14/13  Yes Robert Haber, MD   Review of Systems  Constitutional: Positive for fatigue. Negative for fever and chills.  Respiratory: Negative for cough, chest tightness and shortness of breath.   Cardiovascular: Negative for chest pain.  Gastrointestinal: Negative for nausea and diarrhea.  Skin: Positive for rash.  Psychiatric/Behavioral: Positive for dysphoric mood.      Objective:   Physical Exam  Nursing note and vitals reviewed. Constitutional: He is oriented to person, place, and time. He appears well-developed and well-nourished. No distress.  HENT:  Head: Normocephalic and atraumatic.  Eyes: Conjunctivae and EOM are normal. Pupils are equal, round, and reactive to light. Right eye exhibits no discharge. Left eye exhibits no discharge. No scleral icterus.  Neck: Normal range of motion. Neck supple. No JVD present.  No tracheal deviation present.  Cardiovascular: Normal rate.   Pulmonary/Chest: Effort normal and breath sounds normal. No respiratory distress.  Abdominal: Soft. Bowel sounds are normal. He exhibits no distension and no mass. There is no tenderness. There is no rebound and no guarding.  Musculoskeletal: Normal range of motion.  Lymphadenopathy:    He has no cervical adenopathy.  Neurological: He is alert and oriented to person, place, and time.  Skin: Skin is warm and dry.  Psychiatric: His behavior is  normal.  Appears quiet and flat   Results for orders placed in visit on 09/24/13  GLUCOSE, POCT (MANUAL RESULT ENTRY)      Result Value Ref Range   POC Glucose 188 (*) 70 - 99 mg/dl   [facility] reading (PRIMARY) done by Dr. Joseph Art prominent left hylum    EKG:  Sinus bradycardia     Assessment & Plan:   Persisting depression with new onset extreme fatigue.  I think the severe fatigue could very well be the heat.  We need to await the rest of the labs before ruling out secondary causes of depression.

## 2013-09-24 NOTE — Patient Instructions (Signed)
We're in the process of checking labs to understand where the symptoms of fatigue and depression may be coming from.

## 2013-09-27 LAB — B. BURGDORFI ANTIBODIES: B burgdorferi Ab IgG+IgM: 0.42 {ISR}

## 2013-09-28 ENCOUNTER — Telehealth: Payer: Self-pay

## 2013-09-28 LAB — ROCKY MTN SPOTTED FVR AB, IGM-BLOOD: ROCKY MTN SPOTTED FEVER, IGM: 1.29 IV

## 2013-09-28 NOTE — Telephone Encounter (Signed)
[facility] called with a critical result for this pt's RMSF. Pt is on Doxy. Spoke with Avery DennisonHeather. Called pt and let him know his results. He seems to be doing better. Heather asked me to advise pt to RTC on Fri for a recheck. Let pt know. He will try his best to get in.

## 2013-09-29 ENCOUNTER — Other Ambulatory Visit: Payer: Self-pay | Admitting: Family Medicine

## 2013-09-30 ENCOUNTER — Ambulatory Visit (INDEPENDENT_AMBULATORY_CARE_PROVIDER_SITE_OTHER): Payer: BC Managed Care – PPO | Admitting: Emergency Medicine

## 2013-09-30 VITALS — BP 141/74 | HR 76 | Temp 98.1°F | Resp 16 | Ht 70.5 in | Wt 235.4 lb

## 2013-09-30 DIAGNOSIS — F3289 Other specified depressive episodes: Secondary | ICD-10-CM

## 2013-09-30 DIAGNOSIS — F32A Depression, unspecified: Secondary | ICD-10-CM

## 2013-09-30 DIAGNOSIS — A770 Spotted fever due to Rickettsia rickettsii: Secondary | ICD-10-CM

## 2013-09-30 DIAGNOSIS — E119 Type 2 diabetes mellitus without complications: Secondary | ICD-10-CM

## 2013-09-30 DIAGNOSIS — F329 Major depressive disorder, single episode, unspecified: Secondary | ICD-10-CM

## 2013-09-30 DIAGNOSIS — A779 Spotted fever, unspecified: Secondary | ICD-10-CM

## 2013-09-30 MED ORDER — DULOXETINE HCL 30 MG PO CPEP: 30.0000 mg | ORAL_CAPSULE | Freq: Every day | ORAL | Status: DC

## 2013-09-30 NOTE — Progress Notes (Signed)
[facility] [address], [place] [address] [phone]  Date:  09/30/2013   Name:  Robert Wise   DOB:  [birth year]   MRN:  [id number]  PCP:  [name], MD    Chief Complaint: Follow-up   History of Present Illness:  Robert Wise is a 53 y.o. very pleasant male patient who presents with the following:  History of depression and requesting treatment.  Seen by Dr Joseph Art and treated for RMSF.  Says he is improved with treatment.  No further fever or rash.  Says he is cranky and has little energy and is not feeling rested with a night's sleep.  Says he takes no joy in activities and is relatively inactive.  Sugars run a bit high but most recent A1C was 7.8 No improvement with over the counter medications or other home remedies. Denies other complaint or health concern today.   Patient Active Problem List   Diagnosis Date Noted  . OSA (obstructive sleep apnea) 12/15/2012    Past Medical History  Diagnosis Date  . Diabetes mellitus without complication   . Hyperlipidemia   . OSA (obstructive sleep apnea) 12/15/2012    Past Surgical History  Procedure Laterality Date  . Ankle surgery Left 2006    History  Substance Use Topics  . Smoking status: Current Every Day Smoker -- 1.50 packs/day for 37 years    Types: Cigarettes  . Smokeless tobacco: Never Used  . Alcohol Use: No     Comment: quit: 08/14/2011    No family history on file.  Allergies  Allergen Reactions  . Tricor [Fenofibrate]   . Zocor [Simvastatin]     Medication list has been reviewed and updated.  Current Outpatient Prescriptions on File Prior to Visit  Medication Sig Dispense Refill  . atorvastatin (LIPITOR) 20 MG tablet TAKE 1 TABLET (20 MG TOTAL) BY MOUTH DAILY. PATIENT NEEDS OFFICE VISIT FOR ADDITIONAL REFILLS  30 tablet  3  . Blood Glucose Monitoring Suppl (ONE TOUCH ULTRA SYSTEM KIT) W/DEVICE KIT 1 kit by Does not apply route once.  1 each  0  . doxycycline  (VIBRA-TABS) 100 MG tablet Take 1 tablet (100 mg total) by mouth 2 (two) times daily.  20 tablet  0  . fish oil-omega-3 fatty acids 1000 MG capsule Take 2 g by mouth daily.      Marland Kitchen HYDROcodone-acetaminophen (NORCO) 5-325 MG per tablet Take 1 tablet by mouth every 12 (twelve) hours as needed.  60 tablet  0  . JANUMET 50-500 MG per tablet TAKE 1 TABLET EVERY DAY  60 tablet  9  . levothyroxine (SYNTHROID, LEVOTHROID) 150 MCG tablet Take 1 tablet (150 mcg total) by mouth daily.  90 tablet  3  . omeprazole (PRILOSEC) 20 MG capsule TAKE 1 CAPSULE (20 MG TOTAL) BY MOUTH DAILY.  90 capsule  3  . PARoxetine (PAXIL) 20 MG tablet TAKE 1 TABLET (20 MG TOTAL) BY MOUTH DAILY. PATIENT NEEDS OFFICE VISIT FOR ADDITIONAL REFILLS  30 tablet  3  . Saxagliptin-Metformin (KOMBIGLYZE XR) 07-998 MG TB24 Take 1 tablet by mouth daily.  30 tablet  5   No current facility-administered medications on file prior to visit.    Review of Systems:  As per HPI, otherwise negative.    Physical Examination: Filed Vitals:   09/30/13 1915  BP: 141/74  Pulse: 76  Temp: 98.1 F (36.7 C)  Resp: 16   Filed Vitals:   09/30/13 1915  Height: 5'  10.5" (1.791 m)  Weight: 235 lb 6 oz (106.765 kg)   Body mass index is 33.28 kg/(m^2). Ideal Body Weight: Weight in (lb) to have BMI = 25: 176.4   GEN: WDWN, NAD, Non-toxic, Alert & Oriented x 3 HEENT: Atraumatic, Normocephalic.  Ears and Nose: No external deformity. EXTR: No clubbing/cyanosis/edema NEURO: Normal gait.  PSYCH: Normally interactive. Conversant. Not depressed or anxious appearing.  Calm demeanor.    Assessment and Plan: Depression Stop paxil cymbalta Follow up in a month with Dr Carlean Jews   Signed,  Ellison Carwin, MD

## 2013-10-19 ENCOUNTER — Telehealth: Payer: Self-pay

## 2013-10-19 NOTE — Telephone Encounter (Signed)
Patient needs a refill for his hydrocodone. Please return call and advise. CB # [phone]

## 2013-10-20 ENCOUNTER — Other Ambulatory Visit: Payer: Self-pay | Admitting: Family Medicine

## 2013-10-20 DIAGNOSIS — M545 Low back pain, unspecified: Secondary | ICD-10-CM

## 2013-10-20 MED ORDER — HYDROCODONE-ACETAMINOPHEN 5-325 MG PO TABS: 1.0000 | ORAL_TABLET | Freq: Two times a day (BID) | ORAL | Status: DC | PRN

## 2013-10-21 NOTE — Telephone Encounter (Signed)
Notified pt on vm Rx if ready.

## 2013-11-17 ENCOUNTER — Telehealth: Payer: Self-pay

## 2013-11-17 ENCOUNTER — Other Ambulatory Visit: Payer: Self-pay | Admitting: Family Medicine

## 2013-11-17 DIAGNOSIS — M545 Low back pain, unspecified: Secondary | ICD-10-CM

## 2013-11-17 MED ORDER — HYDROCODONE-ACETAMINOPHEN 5-325 MG PO TABS: 1.0000 | ORAL_TABLET | Freq: Two times a day (BID) | ORAL | Status: DC | PRN

## 2013-11-17 NOTE — Telephone Encounter (Signed)
Refill request HYDROcodone-acetaminophen (NORCO) 5-325 MG per tablet

## 2013-11-18 NOTE — Telephone Encounter (Signed)
Notified pt ready. 

## 2013-11-28 ENCOUNTER — Other Ambulatory Visit: Payer: Self-pay | Admitting: Family Medicine

## 2013-12-13 ENCOUNTER — Other Ambulatory Visit: Payer: Self-pay

## 2013-12-13 ENCOUNTER — Telehealth: Payer: Self-pay

## 2013-12-13 DIAGNOSIS — M545 Low back pain, unspecified: Secondary | ICD-10-CM

## 2013-12-13 MED ORDER — HYDROCODONE-ACETAMINOPHEN 5-325 MG PO TABS: 1.0000 | ORAL_TABLET | Freq: Two times a day (BID) | ORAL | Status: DC | PRN

## 2013-12-13 NOTE — Telephone Encounter (Signed)
Refill for hydrocodone °

## 2013-12-13 NOTE — Telephone Encounter (Signed)
Pended for review

## 2013-12-17 ENCOUNTER — Other Ambulatory Visit: Payer: Self-pay | Admitting: *Deleted

## 2013-12-17 ENCOUNTER — Other Ambulatory Visit: Payer: Self-pay | Admitting: Family Medicine

## 2013-12-17 DIAGNOSIS — M545 Low back pain, unspecified: Secondary | ICD-10-CM

## 2013-12-17 NOTE — Telephone Encounter (Signed)
Pt called requesting a refill of hydrocodone.  °

## 2013-12-18 MED ORDER — HYDROCODONE-ACETAMINOPHEN 5-325 MG PO TABS: 1.0000 | ORAL_TABLET | Freq: Two times a day (BID) | ORAL | Status: DC | PRN

## 2013-12-19 NOTE — Telephone Encounter (Signed)
This was sent in yesterday

## 2013-12-21 ENCOUNTER — Telehealth: Payer: Self-pay | Admitting: *Deleted

## 2013-12-21 DIAGNOSIS — M545 Low back pain, unspecified: Secondary | ICD-10-CM

## 2013-12-21 MED ORDER — HYDROCODONE-ACETAMINOPHEN 5-325 MG PO TABS: 1.0000 | ORAL_TABLET | Freq: Two times a day (BID) | ORAL | Status: DC | PRN

## 2013-12-21 NOTE — Telephone Encounter (Signed)
Pt came to office to pick up script 12/18/13 for Norco- unable to locate rx- reprinted for Dr. Milus Glazier signature.

## 2014-01-12 ENCOUNTER — Telehealth: Payer: Self-pay

## 2014-01-12 DIAGNOSIS — M545 Low back pain, unspecified: Secondary | ICD-10-CM

## 2014-01-12 MED ORDER — HYDROCODONE-ACETAMINOPHEN 5-325 MG PO TABS: 1.0000 | ORAL_TABLET | Freq: Two times a day (BID) | ORAL | Status: DC | PRN

## 2014-01-12 NOTE — Telephone Encounter (Signed)
He needs to come in for evaluation next month

## 2014-01-12 NOTE — Telephone Encounter (Signed)
Pt called in for a refill for hyrocodone. He can be reached [phone]. Thank you

## 2014-01-12 NOTE — Telephone Encounter (Signed)
Pt.notified

## 2014-01-26 ENCOUNTER — Other Ambulatory Visit: Payer: Self-pay | Admitting: *Deleted

## 2014-01-26 MED ORDER — ATORVASTATIN CALCIUM 20 MG PO TABS: ORAL_TABLET | ORAL | Status: DC

## 2014-02-09 ENCOUNTER — Ambulatory Visit (INDEPENDENT_AMBULATORY_CARE_PROVIDER_SITE_OTHER): Payer: BC Managed Care – PPO | Admitting: Family Medicine

## 2014-02-09 VITALS — BP 122/74 | HR 70 | Temp 98.3°F | Resp 17 | Ht 70.0 in | Wt 225.0 lb

## 2014-02-09 DIAGNOSIS — F32A Depression, unspecified: Secondary | ICD-10-CM

## 2014-02-09 DIAGNOSIS — M545 Low back pain, unspecified: Secondary | ICD-10-CM

## 2014-02-09 DIAGNOSIS — IMO0002 Reserved for concepts with insufficient information to code with codable children: Secondary | ICD-10-CM

## 2014-02-09 DIAGNOSIS — E1165 Type 2 diabetes mellitus with hyperglycemia: Secondary | ICD-10-CM

## 2014-02-09 DIAGNOSIS — F329 Major depressive disorder, single episode, unspecified: Secondary | ICD-10-CM

## 2014-02-09 LAB — POCT CBC
Granulocyte percent: 57.9 %G (ref 37–80)
HCT, POC: 43.7 % (ref 43.5–53.7)
Hemoglobin: 14.5 g/dL (ref 14.1–18.1)
Lymph, poc: 3.4 (ref 0.6–3.4)
MCH, POC: 29 pg (ref 27–31.2)
MCHC: 33.1 g/dL (ref 31.8–35.4)
MCV: 87.6 fL (ref 80–97)
MID (cbc): 0.3 (ref 0–0.9)
MPV: 8.1 fL (ref 0–99.8)
POC Granulocyte: 5 (ref 2–6.9)
POC LYMPH PERCENT: 38.7 %L (ref 10–50)
POC MID %: 3.4 %M (ref 0–12)
Platelet Count, POC: 250 10*3/uL (ref 142–424)
RBC: 4.99 M/uL (ref 4.69–6.13)
RDW, POC: 14.6 %
WBC: 8.7 10*3/uL (ref 4.6–10.2)

## 2014-02-09 LAB — POCT GLYCOSYLATED HEMOGLOBIN (HGB A1C): Hemoglobin A1C: 9.5

## 2014-02-09 MED ORDER — VENLAFAXINE HCL ER 150 MG PO CP24: 150.0000 mg | ORAL_CAPSULE | Freq: Every day | ORAL | Status: DC

## 2014-02-09 MED ORDER — GLIPIZIDE 5 MG PO TABS: ORAL_TABLET | ORAL | Status: DC

## 2014-02-09 MED ORDER — HYDROCODONE-ACETAMINOPHEN 5-325 MG PO TABS: 1.0000 | ORAL_TABLET | Freq: Two times a day (BID) | ORAL | Status: DC | PRN

## 2014-02-09 MED ORDER — ATORVASTATIN CALCIUM 20 MG PO TABS: ORAL_TABLET | ORAL | Status: DC

## 2014-02-09 NOTE — Progress Notes (Signed)
Subjective:   This chart was scribed for Robert Haber, MD by Forrestine Him, [facility] Scribe. This patient was seen in room 8 and the patient's care was started 5:45 PM.    Patient ID: Robert Wise, male    DOB: 25-Sep-1960, 53 y.o.   MRN: [id number]  Chief Complaint  Patient presents with   Fatigue   Depression    HPI  HPI Comments: Robert Wise is a 53 y.o. male with a PMHx of DM and hyperlipidemia who presents to [facility] complaining of ongoing, constant depression x 1 month that is unchanged. Pt also reports new onset fatigue, intermittent palpitations, and intermittent abdominal discomfort. He admits to not wanting to come out of the house and would rather sleep throughout the day. He reports recent loss of appetite and states "i just have not been eating right". Pt denies any SI/HI at this time. Pt was previously on Plaxil in past but stopped taking medication. However, he was then started on Cymbalta and started 5 months ago. Robert Wise states blood sugars remain at approximately 200 daily. He is working 50 hours weekly at 6 days a week. Pt with known allergies to tricor and zocor. No other concerns this visit.  He is a Administrator.  Patient Active Problem List   Diagnosis Date Noted   OSA (obstructive sleep apnea) 12/15/2012   Past Medical History  Diagnosis Date   Diabetes mellitus without complication    Hyperlipidemia    OSA (obstructive sleep apnea) 12/15/2012   Past Surgical History  Procedure Laterality Date   Ankle surgery Left 2006   Allergies  Allergen Reactions   Tricor [Fenofibrate]    Zocor [Simvastatin]    Prior to Admission medications   Medication Sig Start Date End Date Taking? Authorizing Provider  atorvastatin (LIPITOR) 20 MG tablet TAKE 1 TABLET (20 MG TOTAL) BY MOUTH DAILY. PATIENT NEEDS OFFICE VISIT FOR ADDITIONAL REFILLS 01/26/14  Yes Robert Haber, MD  Blood Glucose Monitoring  Suppl (ONE TOUCH ULTRA SYSTEM KIT) W/DEVICE KIT 1 kit by Does not apply route once. 10/25/11  Yes [name], PA-C  DULoxetine (CYMBALTA) 30 MG capsule Take 1 capsule (30 mg total) by mouth daily. 09/30/13  Yes [name], MD  fish oil-omega-3 fatty acids 1000 MG capsule Take 2 g by mouth daily.   Yes Historical Provider, MD  glipiZIDE (GLUCOTROL) 5 MG tablet Take one tablet daily  NEED OFFICE VISIT FOR FURTHER REFILLS 12/17/13  Yes [name], PA-C  HYDROcodone-acetaminophen (NORCO) 5-325 MG per tablet Take 1 tablet by mouth every 12 (twelve) hours as needed. 01/12/14  Yes Robert Haber, MD  Lancets [facility] ULTRASOFT) lancets USE AS INSTRUCTED 11/28/13  Yes Robert Haber, MD  levothyroxine (SYNTHROID, LEVOTHROID) 150 MCG tablet Take 1 tablet (150 mcg total) by mouth daily. 03/16/13  Yes Robert Haber, MD  omeprazole (PRILOSEC) 20 MG capsule TAKE 1 CAPSULE (20 MG TOTAL) BY MOUTH DAILY. 09/21/13  Yes [name], PA-C  ONE TOUCH ULTRA TEST test strip TEST 3 TIMES A DAY 11/28/13  Yes Robert Haber, MD  Saxagliptin-Metformin (KOMBIGLYZE XR) 07-998 MG TB24 Take 1 tablet by mouth daily. 06/14/13  Yes Robert Haber, MD  JANUMET 50-500 MG per tablet TAKE 1 TABLET EVERY DAY 04/05/13   Robert Haber, MD  PARoxetine (PAXIL) 20 MG tablet TAKE 1 TABLET (20 MG TOTAL) BY MOUTH DAILY. PATIENT NEEDS OFFICE VISIT FOR ADDITIONAL REFILLS    Robert Wise  Robert Bushman, PA-C    Review of Systems  Constitutional: Positive for activity change, appetite change and fatigue. Negative for fever and chills.  Cardiovascular: Positive for palpitations.  Gastrointestinal: Positive for abdominal pain.  Psychiatric/Behavioral: Negative for suicidal ideas and sleep disturbance. The patient is not nervous/anxious.     Triage Vitals: BP 122/74 mmHg   Pulse 70   Temp(Src) 98.3 F (36.8 C) (Oral)   Resp 17   Ht 5' 10"  (1.778 m)   Wt 225 lb (102.059 kg)   BMI 32.28 kg/m2   SpO2 97%   Objective:  Physical Exam    Constitutional: He is oriented to person, place, and time. He appears well-developed and well-nourished.  Tearful during examination  HENT:  Head: Normocephalic.  Eyes: EOM are normal.  Neck: Normal range of motion.  Pulmonary/Chest: Effort normal.  Abdominal: He exhibits no distension.  Musculoskeletal: Normal range of motion.  Neurological: He is alert and oriented to person, place, and time.  Psychiatric: He exhibits a depressed mood. He expresses no suicidal ideation. He expresses no suicidal plans.  Nursing note and vitals reviewed.   Assessment & Plan:   I personally performed the services described in this documentation, which was scribed in my presence. The recorded information has been reviewed and is accurate.  Depression - Plan: POCT CBC, Comprehensive metabolic panel, TSH, venlafaxine XR (EFFEXOR XR) 150 MG 24 hr capsule  Diabetes type 2, uncontrolled - Plan: glipiZIDE (GLUCOTROL) 5 MG tablet, POCT glycosylated hemoglobin (Hb A1C)  Midline low back pain without sciatica - Plan: HYDROcodone-acetaminophen (NORCO) 5-325 MG per tablet  Signed, Robert Haber, MD

## 2014-02-10 ENCOUNTER — Telehealth: Payer: Self-pay

## 2014-02-10 LAB — COMPREHENSIVE METABOLIC PANEL
ALT: 14 U/L (ref 0–53)
AST: 15 U/L (ref 0–37)
Albumin: 4.7 g/dL (ref 3.5–5.2)
Alkaline Phosphatase: 88 U/L (ref 39–117)
BUN: 10 mg/dL (ref 6–23)
CO2: 27 mEq/L (ref 19–32)
Calcium: 9.6 mg/dL (ref 8.4–10.5)
Chloride: 99 mEq/L (ref 96–112)
Creat: 0.75 mg/dL (ref 0.50–1.35)
Glucose, Bld: 225 mg/dL — ABNORMAL HIGH (ref 70–99)
Potassium: 4.1 mEq/L (ref 3.5–5.3)
Sodium: 136 mEq/L (ref 135–145)
Total Bilirubin: 0.6 mg/dL (ref 0.2–1.2)
Total Protein: 7.4 g/dL (ref 6.0–8.3)

## 2014-02-10 LAB — TSH: TSH: 3.241 u[IU]/mL (ref 0.350–4.500)

## 2014-02-10 NOTE — Telephone Encounter (Signed)
Dr Milus GlazierLauenstein wrote a Rx for hydrocodone to be picked up by pt on 02/16/14. It is in the p/up drawer w/a note on it for date it can be p/up.

## 2014-02-25 ENCOUNTER — Telehealth: Payer: Self-pay | Admitting: Family Medicine

## 2014-02-25 NOTE — Telephone Encounter (Signed)
Patient states that he has an infection in his lymph glands and needs amoxicillin to clear it up. Please advise. [facility] [place]   [phone] (M)

## 2014-02-28 NOTE — Telephone Encounter (Signed)
Lm Advised pt to RTC- unable to prescribe abx over the phone he needs to be evaluated.

## 2014-03-01 ENCOUNTER — Ambulatory Visit (INDEPENDENT_AMBULATORY_CARE_PROVIDER_SITE_OTHER): Payer: BC Managed Care – PPO | Admitting: Family Medicine

## 2014-03-01 VITALS — BP 114/74 | HR 80 | Temp 98.2°F | Resp 16 | Ht 70.75 in | Wt 224.0 lb

## 2014-03-01 DIAGNOSIS — H66005 Acute suppurative otitis media without spontaneous rupture of ear drum, recurrent, left ear: Secondary | ICD-10-CM

## 2014-03-01 DIAGNOSIS — F32A Depression, unspecified: Secondary | ICD-10-CM

## 2014-03-01 DIAGNOSIS — F329 Major depressive disorder, single episode, unspecified: Secondary | ICD-10-CM

## 2014-03-01 DIAGNOSIS — E039 Hypothyroidism, unspecified: Secondary | ICD-10-CM

## 2014-03-01 DIAGNOSIS — F331 Major depressive disorder, recurrent, moderate: Secondary | ICD-10-CM

## 2014-03-01 MED ORDER — PAROXETINE HCL 20 MG PO TABS: 20.0000 mg | ORAL_TABLET | Freq: Every day | ORAL | Status: DC

## 2014-03-01 MED ORDER — AMOXICILLIN-POT CLAVULANATE 875-125 MG PO TABS: 1.0000 | ORAL_TABLET | Freq: Two times a day (BID) | ORAL | Status: DC

## 2014-03-01 MED ORDER — VENLAFAXINE HCL ER 75 MG PO CP24: 75.0000 mg | ORAL_CAPSULE | Freq: Every day | ORAL | Status: DC

## 2014-03-01 NOTE — Patient Instructions (Signed)
Start the paxil 20mg  daily.  In another 10d when you are finished with your current bottle of effexor 150 then switch to the effexor 75.  Recheck with either myself or Dr. Milus GlazierLauenstein in 1 month - sooner if worsening. Make sure you are taking the levothyroxine by itself on an empty stomach every morning - do not take anything else for 30 min later Start a vitamin B complex supplement.  [facility]  [address], [place], [place] [address]  [phone]  [facility]  Address: [address], [place], [place] [address]  [phone]  [facility] - Dr. Allena Katzeadling Address: [address], [place], [place] [address]  [phone]  [facility] [address] [place], [place] Phone: [phone]  Major Depressive Disorder Major depressive disorder is a mental illness. It also may be called clinical depression or unipolar depression. Major depressive disorder usually causes feelings of sadness, hopelessness, or helplessness. Some people with this disorder do not feel particularly sad but lose interest in doing things they used to enjoy (anhedonia). Major depressive disorder also can cause physical symptoms. It can interfere with work, school, relationships, and other normal everyday activities. The disorder varies in severity but is longer lasting and more serious than the sadness we all feel from time to time in our lives. Major depressive disorder often is triggered by stressful life events or major life changes. Examples of these triggers include divorce, loss of your job or home, a move, and the death of a family member or close friend. Sometimes this disorder occurs for no obvious reason at all. People who have family members with major depressive disorder or bipolar disorder are at higher risk for developing this disorder, with or without life stressors. Major depressive  disorder can occur at any age. It may occur just once in your life (single episode major depressive disorder). It may occur multiple times (recurrent major depressive disorder). SYMPTOMS People with major depressive disorder have either anhedonia or depressed mood on nearly a daily basis for at least 2 weeks or longer. Symptoms of depressed mood include:  Feelings of sadness (blue or down in the dumps) or emptiness.  Feelings of hopelessness or helplessness.  Tearfulness or episodes of crying (may be observed by others).  Irritability (children and adolescents). In addition to depressed mood or anhedonia or both, people with this disorder have at least four of the following symptoms:  Difficulty sleeping or sleeping too much.   Significant change (increase or decrease) in appetite or weight.   Lack of energy or motivation.  Feelings of guilt and worthlessness.   Difficulty concentrating, remembering, or making decisions.  Unusually slow movement (psychomotor retardation) or restlessness (as observed by others).   Recurrent wishes for death, recurrent thoughts of self-harm (suicide), or a suicide attempt. People with major depressive disorder commonly have persistent negative thoughts about themselves, other people, and the world. People with severe major depressive disorder may experiencedistorted beliefs or perceptions about the world (psychotic delusions). They also may see or hear things that are not real (psychotic hallucinations). DIAGNOSIS Major depressive disorder is diagnosed through an assessment by your health care provider. Your health care provider will ask aboutaspects of your daily life, such as mood,sleep, and appetite, to see if you have the diagnostic symptoms of major depressive disorder. Your health care provider may ask about your medical history and use of alcohol or drugs, including prescription medicines. Your health care provider  also may do a physical exam  and blood work. This is because certain medical conditions and the use of certain substances can cause major depressive disorder-like symptoms (secondary depression). Your health care provider also may refer you to a mental health specialist for further evaluation and treatment. TREATMENT It is important to recognize the symptoms of major depressive disorder and seek treatment. The following treatments can be prescribed for this disorder:   Medicine. Antidepressant medicines usually are prescribed. Antidepressant medicines are thought to correct chemical imbalances in the brain that are commonly associated with major depressive disorder. Other types of medicine may be added if the symptoms do not respond to antidepressant medicines alone or if psychotic delusions or hallucinations occur.  Talk therapy. Talk therapy can be helpful in treating major depressive disorder by providing support, education, and guidance. Certain types of talk therapy also can help with negative thinking (cognitive behavioral therapy) and with relationship issues that trigger this disorder (interpersonal therapy). A mental health specialist can help determine which treatment is best for you. Most people with major depressive disorder do well with a combination of medicine and talk therapy. Treatments involving electrical stimulation of the brain can be used in situations with extremely severe symptoms or when medicine and talk therapy do not work over time. These treatments include electroconvulsive therapy, transcranial magnetic stimulation, and vagal nerve stimulation. Document Released: 07/13/2012 Document Revised: 08/02/2013 Document Reviewed: 07/13/2012 [facility] Patient Information 2015 [employer] [place]. This information is not intended to replace advice given to you by your health care provider. Make sure you discuss any questions you have with your health care provider.

## 2014-03-01 NOTE — Progress Notes (Signed)
Subjective:  This chart was scribed for Norberto SorensonEva Shaw, MD by Carl Bestelina Holson, Medical Scribe. This patient was seen in Room 2 and the patient's care was started at 2:18 PM.   Patient ID: Robert Morseavid L Saffo, male    DOB: 03/22/1961, 53 y.o.   MRN: [id number]  Chief Complaint  Patient presents with  . Follow-up    Depression  . Depression    Has not improved    HPI HPI Comments: Robert MorseDavid L Stalder is a 53 y.o. male who presents to the [facility] complaining of unresolved depression.  His depression started 11 years ago after he started having problems with his thyroid and he immediately started taking Paxil.  He doesn't believe that he's thought about suicide but some days, he feels like he doesn't want to live.  He has never seen a psychiatrist or therapist for his depression.  He did not feel depressed as a child or young adult and cannot pinpoint any specific triggers for his depression.    She states that his symptoms started in June of this year after he was bitten by a tick.  [facility] and started complaining of depression at that time.  At a follow-up visit, he saw Dr. Dareen PianoAnderson and he went more in depth about his depression symptoms.  The Paxil was discontinued at that visit and the patient's wife describes his symptoms as a "downward spiral" since that visit.  He had been on Paxil for several years with his last dose being 20mg .  He feels that his depression was better managed while taking Paxil.  Prior to his visit with Dr. Dareen PianoAnderson he felt stable.     He was seen 3 weeks ago by Dr. Milus GlazierLauenstein complaining of depression unchanged for 1 month with fatigue, increased sleep, anhedonia, and isolation.  He was also complaining of loss of appetitie but denied SI or HI.  He had been on Paxil previously and 6 months ago was tried on Cymbalta which he did not like taking.  He was started on Effexor 150mg  daily 3 weeks ago and had his thyroid levels  checked at that time which were within normal limits.  The patient's wife states that the Effexor has helped control the patient's crying spells but has not resolved his anxiety, paranoia, or sadness.  His appetite has not changed.  He gets about 7-8 hours of sleep a night but does not wake up feeling well-rested.  He does not use any sleep aids.    He is also complaining of painful, swollen lymph nodes that started 2 weeks ago.  He experiences these symptoms once a year and has been treated with amoxicillin in the past with complete resolution of his symptoms.  He denies fever, sore throat, rhinorrhea, chills, ear pain, and dental pain as associated symptoms.  The patient's PCP is Dr. Milus GlazierLauenstein.   Past Medical History  Diagnosis Date  . Diabetes mellitus without complication   . Hyperlipidemia   . OSA (obstructive sleep apnea) 12/15/2012   Past Surgical History  Procedure Laterality Date  . Ankle surgery Left 2006   History reviewed. No pertinent family history.  History   Social History  . Marital Status: Married    Spouse Name: Neysa BonitoChristy    Number of Children: 3  . Years of Education: 11.5   Occupational History  .      Driver   Social History Main Topics  . Smoking status: Current  Every Day Smoker -- 1.50 packs/day for 37 years    Types: Cigarettes  . Smokeless tobacco: Never Used  . Alcohol Use: No     Comment: quit: 08/14/2011  . Drug Use: No  . Sexual Activity: Yes    Birth Control/ Protection: None   Other Topics Concern  . Not on file   Social History Narrative   Patient lives at home with family.    Caffeine Use: 1 pot of coffee daily   Allergies  Allergen Reactions  . Tricor [Fenofibrate]   . Zocor [Simvastatin]     Review of Systems  Constitutional: Positive for activity change and fatigue. Negative for fever, chills and appetite change.  HENT: Negative for dental problem, ear pain, rhinorrhea and sore throat.   Psychiatric/Behavioral: Positive for  dysphoric mood.    Objective:  BP 114/74 mmHg  Pulse 80  Temp(Src) 98.2 F (36.8 C) (Oral)  Resp 16  Ht 5' 10.75" (1.797 m)  Wt 224 lb (101.606 kg)  BMI 31.46 kg/m2  SpO2 97%  Physical Exam  Constitutional: He is oriented to person, place, and time. He appears well-developed and well-nourished.  HENT:  Head: Normocephalic and atraumatic.  Right Ear: Hearing, external ear and ear canal normal. Tympanic membrane is injected. A middle ear effusion is present.  Left Ear: Hearing, external ear and ear canal normal. Tympanic membrane is injected.  Nose: Mucosal edema and rhinorrhea (clear) present.  Mouth/Throat: Posterior oropharyngeal erythema present.  Left TM opaque with purulent appearance as well as vascular injection.   Eyes: EOM are normal.  Neck: Normal range of motion.  Cardiovascular: Normal rate, regular rhythm, S1 normal, S2 normal and normal heart sounds.   No murmur heard. Pulmonary/Chest: Effort normal and breath sounds normal. No respiratory distress. He has no wheezes. He has no rales.  Musculoskeletal: Normal range of motion.  Lymphadenopathy:       Head (right side): Tonsillar adenopathy present.       Head (left side): Tonsillar adenopathy present.       Right: No supraclavicular adenopathy present.       Left: No supraclavicular adenopathy present.  Neurological: He is alert and oriented to person, place, and time.  Skin: Skin is warm and dry.  Psychiatric: He has a normal mood and affect. His behavior is normal.  Nursing note and vitals reviewed.   Assessment & Plan:       Major depressive disorder, recurrent episode, moderate  Depression - Plan: venlafaxine XR (EFFEXOR-XR) 75 MG 24 hr capsule - start paxil 20 and wean down from effexor 150 to 75. Recheck in 1 mo - sooner if worse. Rec starting vit B complex supp.  Recommended patient schedule an appointment with a psychiatrist for further treatment.  Recurrent acute suppurative otitis media without  spontaneous rupture of left tympanic membrane - Will treat patient with Amoxicillin and recommended hot steam treatments to alleviate his otitis media.     Hypothyroid - reviewed importance of levothyroxine compliance and administration.  Meds ordered this encounter  Medications  . PARoxetine (PAXIL) 20 MG tablet    Sig: Take 1 tablet (20 mg total) by mouth daily.    Dispense:  30 tablet    Refill:  1  . venlafaxine XR (EFFEXOR-XR) 75 MG 24 hr capsule    Sig: Take 1 capsule (75 mg total) by mouth daily with breakfast.    Dispense:  30 capsule    Refill:  1  . amoxicillin-clavulanate (AUGMENTIN) 875-125 MG per  tablet    Sig: Take 1 tablet by mouth 2 (two) times daily.    Dispense:  20 tablet    Refill:  0    I personally performed the services described in this documentation, which was scribed in my presence. The recorded information has been reviewed and considered, and addended by me as needed.  Norberto SorensonEva Shaw, MD MPH

## 2014-03-09 ENCOUNTER — Ambulatory Visit (INDEPENDENT_AMBULATORY_CARE_PROVIDER_SITE_OTHER): Payer: Self-pay | Admitting: Family Medicine

## 2014-03-09 VITALS — BP 138/76 | HR 87 | Temp 98.6°F | Resp 18 | Ht 70.25 in | Wt 226.8 lb

## 2014-03-09 DIAGNOSIS — M545 Low back pain, unspecified: Secondary | ICD-10-CM

## 2014-03-09 MED ORDER — HYDROCODONE-ACETAMINOPHEN 5-325 MG PO TABS: 1.0000 | ORAL_TABLET | Freq: Two times a day (BID) | ORAL | Status: DC | PRN

## 2014-03-09 NOTE — Progress Notes (Signed)
This chart was scribed for Robert Haber, MD by Edison Simon, ED Scribe. This patient was seen in room 5.      Patient ID: PAPE PARSON MRN: [id number], DOB: 1960/08/15, 53 y.o. Date of Encounter: 03/09/2014, 8:14 PM  Primary Physician: Robert Haber, MD  Chief Complaint: DOT physical, depression  HPI: 53 y.o. year old male male with history below presents for DOT physical and complaining of depression. He was evaluated here for depression on 12/1 by Dr. Brigitte Pulse, who prescribed him Paxil and Effexor. Lab work on 11/11 revealed hemoglobin A1C of 9.5. He states he has been checking his glucose 2-3 times a day and it usually ranges 175-200. He use Glipizide and Metformin.  Past Medical History  Diagnosis Date  . Diabetes mellitus without complication   . Hyperlipidemia   . OSA (obstructive sleep apnea) 12/15/2012     Home Meds: Prior to Admission medications   Medication Sig Start Date End Date Taking? Authorizing Provider  amoxicillin-clavulanate (AUGMENTIN) 875-125 MG per tablet Take 1 tablet by mouth 2 (two) times daily. 03/01/14  Yes Shawnee Knapp, MD  atorvastatin (LIPITOR) 20 MG tablet TAKE 1 TABLET (20 MG TOTAL) BY MOUTH DAILY. 02/09/14  Yes Robert Haber, MD  Blood Glucose Monitoring Suppl (ONE TOUCH ULTRA SYSTEM KIT) W/DEVICE KIT 1 kit by Does not apply route once. 10/25/11  Yes [name], PA-C  fish oil-omega-3 fatty acids 1000 MG capsule Take 2 g by mouth daily.   Yes Historical Provider, MD  glipiZIDE (GLUCOTROL) 5 MG tablet Take one tablet daily  NEED OFFICE VISIT FOR FURTHER REFILLS 02/09/14  Yes Robert Haber, MD  HYDROcodone-acetaminophen (NORCO) 5-325 MG per tablet Take 1 tablet by mouth every 12 (twelve) hours as needed. 02/09/14  Yes Robert Haber, MD  Lancets [facility] ULTRASOFT) lancets USE AS INSTRUCTED 11/28/13  Yes Robert Haber, MD  levothyroxine (SYNTHROID, LEVOTHROID) 150 MCG tablet Take 1 tablet (150 mcg total) by mouth daily. 03/16/13  Yes Robert Haber, MD   omeprazole (PRILOSEC) 20 MG capsule TAKE 1 CAPSULE (20 MG TOTAL) BY MOUTH DAILY. 09/21/13  Yes [name], PA-C  ONE TOUCH ULTRA TEST test strip TEST 3 TIMES A DAY 11/28/13  Yes Robert Haber, MD  PARoxetine (PAXIL) 20 MG tablet Take 1 tablet (20 mg total) by mouth daily. 03/01/14  Yes Shawnee Knapp, MD  Saxagliptin-Metformin (KOMBIGLYZE XR) 07-998 MG TB24 Take 1 tablet by mouth daily. 06/14/13  Yes Robert Haber, MD  venlafaxine XR (EFFEXOR-XR) 75 MG 24 hr capsule Take 1 capsule (75 mg total) by mouth daily with breakfast. 03/01/14  Yes Shawnee Knapp, MD    Allergies:  Allergies  Allergen Reactions  . Tricor [Fenofibrate]   . Zocor [Simvastatin]     History   Social History  . Marital Status: Married    Spouse Name: [name]    Number of Children: 3  . Years of Education: 11.5   Occupational History  .      Driver   Social History Main Topics  . Smoking status: Current Every Day Smoker -- 1.50 packs/day for 37 years    Types: Cigarettes  . Smokeless tobacco: Never Used  . Alcohol Use: No     Comment: quit: 08/14/2011  . Drug Use: No  . Sexual Activity: Yes    Birth Control/ Protection: None   Other Topics Concern  . Not on file   Social History Narrative   Patient lives at home with family.    Caffeine Use: 1  pot of coffee daily     Review of Systems: Constitutional: negative for chills, fever, night sweats, weight changes, or fatigue  HEENT: negative for vision changes, hearing loss, congestion, rhinorrhea, ST, epistaxis, or sinus pressure Cardiovascular: negative for chest pain or palpitations Respiratory: negative for hemoptysis, wheezing, shortness of breath, or cough Abdominal: negative for abdominal pain, nausea, vomiting, diarrhea, or constipation Dermatological: negative for rash Psychological: positive depression Neurologic: negative for headache, dizziness, or syncope All other systems reviewed and are otherwise negative with the exception to those above  and in the HPI.   Physical Exam: Blood pressure 138/76, pulse 87, temperature 98.6 F (37 C), temperature source Oral, resp. rate 18, height 5' 10.25" (1.784 m), weight 226 lb 12.8 oz (102.876 kg), SpO2 96 %., Body mass index is 32.32 kg/(m^2). General: Well developed, well nourished, in no acute distress. Head: Normocephalic, atraumatic, eyes without discharge, sclera non-icteric, nares are without discharge. Bilateral auditory canals clear, TM's are without perforation, pearly grey and translucent with reflective cone of light bilaterally. Oral cavity moist, posterior pharynx without exudate, erythema, peritonsillar abscess, or post nasal drip.  Neck: Supple. No thyromegaly. Full ROM. No lymphadenopathy. Lungs: Clear bilaterally to auscultation without wheezes, rales, or rhonchi. Breathing is unlabored. Heart: RRR with S1 S2. No murmurs, rubs, or gallops appreciated. Blood pressure measured manually at 108/60. Abdomen: Soft, non-tender, non-distended with normoactive bowel sounds. No hepatomegaly. No rebound/guarding. No obvious abdominal masses. Msk:  Strength and tone normal for age. Extremities/Skin: Warm and dry. No clubbing or cyanosis. No edema. No rashes or suspicious lesions. Neuro: Alert and oriented X 3. Moves all extremities spontaneously. Gait is normal. CNII-XII grossly in tact. Psych:  Responds to questions appropriately with a normal affect.     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    ICD-9-CM ICD-10-CM   1. Midline low back pain without sciatica 724.2 M54.5 HYDROcodone-acetaminophen (NORCO) 5-325 MG per tablet    Signed, Robert Haber, MD    Signed, Robert Haber, MD 03/09/2014 8:10 PM

## 2014-03-12 ENCOUNTER — Other Ambulatory Visit: Payer: Self-pay | Admitting: Family Medicine

## 2014-04-07 ENCOUNTER — Telehealth: Payer: Self-pay

## 2014-04-07 NOTE — Telephone Encounter (Signed)
Pt of Dr. Elbert Ewingsl requesting refill on HYDROcodone-acetaminophen (NORCO) 5-325 MG per tablet [[id number]]

## 2014-04-08 ENCOUNTER — Other Ambulatory Visit: Payer: Self-pay | Admitting: Family Medicine

## 2014-04-08 DIAGNOSIS — M545 Low back pain, unspecified: Secondary | ICD-10-CM

## 2014-04-08 MED ORDER — HYDROCODONE-ACETAMINOPHEN 5-325 MG PO TABS
1.0000 | ORAL_TABLET | Freq: Two times a day (BID) | ORAL | Status: DC | PRN
Start: 2014-04-08 — End: 2014-05-04

## 2014-04-08 NOTE — Telephone Encounter (Signed)
Notified pt ready. 

## 2014-04-25 ENCOUNTER — Other Ambulatory Visit: Payer: Self-pay | Admitting: Family Medicine

## 2014-05-03 ENCOUNTER — Telehealth: Payer: Self-pay

## 2014-05-03 NOTE — Telephone Encounter (Signed)
REFILL REQUEST   HYDROcodone-acetaminophen (NORCO) 5-325 MG per tablet   [phone]

## 2014-05-04 ENCOUNTER — Other Ambulatory Visit: Payer: Self-pay | Admitting: Family Medicine

## 2014-05-04 DIAGNOSIS — M545 Low back pain, unspecified: Secondary | ICD-10-CM

## 2014-05-04 MED ORDER — HYDROCODONE-ACETAMINOPHEN 5-325 MG PO TABS: 1.0000 | ORAL_TABLET | Freq: Two times a day (BID) | ORAL | Status: DC | PRN

## 2014-05-07 ENCOUNTER — Other Ambulatory Visit: Payer: Self-pay | Admitting: Family Medicine

## 2014-05-10 ENCOUNTER — Other Ambulatory Visit: Payer: Self-pay | Admitting: Family Medicine

## 2014-05-31 ENCOUNTER — Telehealth: Payer: Self-pay

## 2014-05-31 NOTE — Telephone Encounter (Signed)
Pt needs a refill on his hydrocodone  [phone]

## 2014-06-01 ENCOUNTER — Other Ambulatory Visit: Payer: Self-pay | Admitting: Family Medicine

## 2014-06-01 DIAGNOSIS — M545 Low back pain, unspecified: Secondary | ICD-10-CM

## 2014-06-01 MED ORDER — HYDROCODONE-ACETAMINOPHEN 5-325 MG PO TABS: 1.0000 | ORAL_TABLET | Freq: Two times a day (BID) | ORAL | Status: DC | PRN

## 2014-06-01 NOTE — Telephone Encounter (Signed)
Notified pt Rx ready. 

## 2014-06-05 ENCOUNTER — Other Ambulatory Visit: Payer: Self-pay | Admitting: Physician Assistant

## 2014-06-29 ENCOUNTER — Telehealth: Payer: Self-pay

## 2014-06-29 DIAGNOSIS — M545 Low back pain, unspecified: Secondary | ICD-10-CM

## 2014-06-29 NOTE — Telephone Encounter (Signed)
Pt in need of his HYDROCODONE 5-325 mgs. Please call [phone] when ready for pick up

## 2014-06-30 ENCOUNTER — Telehealth: Payer: Self-pay

## 2014-06-30 MED ORDER — HYDROCODONE-ACETAMINOPHEN 5-325 MG PO TABS: 1.0000 | ORAL_TABLET | Freq: Two times a day (BID) | ORAL | Status: DC | PRN

## 2014-06-30 NOTE — Telephone Encounter (Signed)
Spoke to patient and advised him that his RX is ready for pickup at [facility] front desk.

## 2014-07-03 ENCOUNTER — Other Ambulatory Visit: Payer: Self-pay | Admitting: Physician Assistant

## 2014-07-16 ENCOUNTER — Other Ambulatory Visit: Payer: Self-pay | Admitting: Physician Assistant

## 2014-07-18 NOTE — Telephone Encounter (Signed)
Spoke to pt who reported that he thinks that a big part of his depression was d/t his being off of his levothyroxine accidentally, and once he realized it and started taking it again, his depression improved as well. He did state that he still occasionally has some "down and out" days. Pt agreed to try to come see Dr Clelia CroftShaw this Wed evening and he has enough med to last until then.

## 2014-07-25 ENCOUNTER — Other Ambulatory Visit: Payer: Self-pay

## 2014-07-25 ENCOUNTER — Other Ambulatory Visit: Payer: Self-pay | Admitting: Physician Assistant

## 2014-07-25 NOTE — Telephone Encounter (Signed)
Pt is requesting a refill of effexor-xr. 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 He did say we will be RTC next week, though.

## 2014-07-26 NOTE — Telephone Encounter (Signed)
Can we refill? 

## 2014-07-27 ENCOUNTER — Other Ambulatory Visit: Payer: Self-pay | Admitting: Family Medicine

## 2014-07-27 ENCOUNTER — Telehealth: Payer: Self-pay

## 2014-07-27 DIAGNOSIS — M545 Low back pain, unspecified: Secondary | ICD-10-CM

## 2014-07-27 MED ORDER — HYDROCODONE-ACETAMINOPHEN 5-325 MG PO TABS: 1.0000 | ORAL_TABLET | Freq: Two times a day (BID) | ORAL | Status: DC | PRN

## 2014-07-27 MED ORDER — VENLAFAXINE HCL ER 75 MG PO CP24: ORAL_CAPSULE | ORAL | Status: DC

## 2014-07-27 NOTE — Telephone Encounter (Signed)
Pt notified that rx is ready for p/u 

## 2014-07-27 NOTE — Telephone Encounter (Signed)
Refill on hydrocodone. Also states that he needs to speak with Britta MccreedyBarbara about his Effexor prescription.   [phone]

## 2014-07-27 NOTE — Telephone Encounter (Signed)
Spoke with pt who reported that he is a Naval architecttruck driver and has tried to get in to see Dr Clelia CroftShaw as we discussed at last RF, but was not able to. He will get in over the weekend or beginning of next week to see her if I can get him another week of effexor (he has been w/out for 2 days). I sent in another week's RF.   Dr Milus GlazierLauenstein, it looks like you have been managing pt's hydrocodone,do you want to write RF of hydrocodone to cover him until then, or wait for his OV with Dr Clelia CroftShaw? Do you want pt to see continue to see you for this, or discuss w/Dr Clelia CroftShaw since you will not be in office as often? Pended.

## 2014-07-30 ENCOUNTER — Other Ambulatory Visit: Payer: Self-pay | Admitting: Family Medicine

## 2014-08-01 ENCOUNTER — Ambulatory Visit (INDEPENDENT_AMBULATORY_CARE_PROVIDER_SITE_OTHER): Payer: BLUE CROSS/BLUE SHIELD | Admitting: Family Medicine

## 2014-08-01 VITALS — BP 130/80 | HR 87 | Temp 98.7°F | Resp 16 | Ht 70.5 in | Wt 225.0 lb

## 2014-08-01 DIAGNOSIS — G894 Chronic pain syndrome: Secondary | ICD-10-CM | POA: Diagnosis not present

## 2014-08-01 DIAGNOSIS — K219 Gastro-esophageal reflux disease without esophagitis: Secondary | ICD-10-CM

## 2014-08-01 DIAGNOSIS — M545 Low back pain, unspecified: Secondary | ICD-10-CM

## 2014-08-01 DIAGNOSIS — Z79899 Other long term (current) drug therapy: Secondary | ICD-10-CM | POA: Diagnosis not present

## 2014-08-01 DIAGNOSIS — E039 Hypothyroidism, unspecified: Secondary | ICD-10-CM | POA: Diagnosis not present

## 2014-08-01 DIAGNOSIS — Z113 Encounter for screening for infections with a predominantly sexual mode of transmission: Secondary | ICD-10-CM

## 2014-08-01 DIAGNOSIS — E785 Hyperlipidemia, unspecified: Secondary | ICD-10-CM | POA: Diagnosis not present

## 2014-08-01 DIAGNOSIS — Z23 Encounter for immunization: Secondary | ICD-10-CM

## 2014-08-01 DIAGNOSIS — E1165 Type 2 diabetes mellitus with hyperglycemia: Secondary | ICD-10-CM

## 2014-08-01 DIAGNOSIS — F3341 Major depressive disorder, recurrent, in partial remission: Secondary | ICD-10-CM | POA: Diagnosis not present

## 2014-08-01 LAB — GLUCOSE, POCT (MANUAL RESULT ENTRY): POC Glucose: 159 mg/dl — AB (ref 70–99)

## 2014-08-01 LAB — POCT GLYCOSYLATED HEMOGLOBIN (HGB A1C): Hemoglobin A1C: 9

## 2014-08-01 MED ORDER — HYDROCODONE-ACETAMINOPHEN 5-325 MG PO TABS: 1.0000 | ORAL_TABLET | Freq: Two times a day (BID) | ORAL | Status: DC | PRN

## 2014-08-01 MED ORDER — VENLAFAXINE HCL ER 37.5 MG PO CP24: 37.5000 mg | ORAL_CAPSULE | Freq: Every day | ORAL | Status: DC

## 2014-08-01 MED ORDER — PAROXETINE HCL 20 MG PO TABS: 20.0000 mg | ORAL_TABLET | Freq: Every day | ORAL | Status: DC

## 2014-08-01 MED ORDER — HYDROCODONE-ACETAMINOPHEN 5-325 MG PO TABS
1.0000 | ORAL_TABLET | Freq: Two times a day (BID) | ORAL | Status: DC | PRN
Start: 2014-08-01 — End: 2014-12-01

## 2014-08-01 NOTE — Patient Instructions (Addendum)
You need to have an eye exam once a year.  You can make an appointment with the opthomalogist of your choice - I use [facility] on [address] but even [facility] do a good job.  Make sure the send a copy of their note.  Look for high protein products without white flour, white sugar, or cornsyrup.  You should be eating every 3 hours and not 2 hours before bed.  Dinner should be a smaller meal. You can have a snack before bed if you sugar is <120 but make sure it is high protein (nuts, cheese, yogurt, cottage cheese, milk).  [facility] Policy for Prescribing Controlled Substances (Revised 01/2012) 1. Prescriptions for controlled substances will be filled by ONE provider at [facility] with whom you have established and developed a plan for your care, including follow-up. 2. You are encouraged to schedule an appointment with your prescriber at our appointment center for follow-up visits whenever possible. 3. If you request a prescription for the controlled substance while at [facility] for an acute problem (with someone other than your regular prescriber), you MAY be given a ONE-TIME prescription for a 30-day supply of the controlled substance, to allow time for you to return to see your regular prescriber for additional prescriptions. Dr. Raul Del Policy for Prescribing Controlled Substances 1. You will need to be seen for an office visit every 4 months.  Hopefully, all prescriptions can be issued at that time so that you do not need to call in for refill requests 2.  If for some reason, you are not issued all of your prescriptions at a visit, please allow a 72 hour or 3 day window to respond to your refill requests. This is especially important if you need to pick up a prescription over the weekend, so make sure you call during the week prior (e.g. Call by Thursday if you want to be able to pick up a prescription by Sunday). 3. You may only fill your prescriptions at one pharmacy. If you are traveling out of town  for some reason during a time that you will need a refill filled, I request that you notify our office and stick to one chain - such was [facility], [facility], [facility], etc. 4. You may not receive controlled substances from any other provider. If there is an acute condition for which you are seen elsewhere (e.g. you go to the ER after a car accident), please alert the provider that you are on a controlled substance contract and if you are still prescribed a controlled substance, you alert our office within 24 hours of filling it.   5. If you are planning to be seen at our [facility] walk-in clinic for refills, please call ahead of time to confirm that I will be working and please arrive 2 hours prior to the end of my shift to guarantee that you will be seen by myself.  If you are seen at our office for another reason or I am out of the office for an extended period, some providers may be willing to provide a refill on your medications but others are not. It is left up to each provider's discretion at the time of the visit.   6. Please treat our staff with the respect with which you would like to be treated. I know that our medical system can be frustrating but they are doing their job to the best of their abilities as they were instructed.    Blood Glucose Monitoring Monitoring your  blood glucose (also know as blood sugar) helps you to manage your diabetes. It also helps you and your health care provider monitor your diabetes and determine how well your treatment plan is working. WHY SHOULD YOU MONITOR YOUR BLOOD GLUCOSE?  It can help you understand how food, exercise, and medicine affect your blood glucose.  It allows you to know what your blood glucose is at any given moment. You can quickly tell if you are having low blood glucose (hypoglycemia) or high blood glucose (hyperglycemia).  It can help you and your health care provider know how to adjust your medicines.  It can help you understand how to manage an  illness or adjust medicine for exercise. WHEN SHOULD YOU TEST? Your health care provider will help you decide how often you should check your blood glucose. This may depend on the type of diabetes you have, your diabetes control, or the types of medicines you are taking. Be sure to write down all of your blood glucose readings so that this information can be reviewed with your health care provider. See below for examples of testing times that your health care provider may suggest. Type 1 Diabetes  Test 4 times a day if you are in good control, using an insulin pump, or perform multiple daily injections.  If your diabetes is not well controlled or if you are sick, you may need to monitor more often.  It is a good idea to also monitor:  Before and after exercise.  Between meals and 2 hours after a meal.  Occasionally between 2:00 a.m. and 3:00 a.m. Type 2 Diabetes  It can vary with each person, but generally, if you are on insulin, test 4 times a day.  If you take medicines by mouth (orally), test 2 times a day.  If you are on a controlled diet, test once a day.  If your diabetes is not well controlled or if you are sick, you may need to monitor more often. HOW TO MONITOR YOUR BLOOD GLUCOSE Supplies Needed  Blood glucose meter.  Test strips for your meter. Each meter has its own strips. You must use the strips that go with your own meter.  A pricking needle (lancet).  A device that holds the lancet (lancing device).  A journal or log book to write down your results. Procedure  Wash your hands with soap and water. Alcohol is not preferred.  Prick the side of your finger (not the tip) with the lancet.  Gently milk the finger until a small drop of blood appears.  Follow the instructions that come with your meter for inserting the test strip, applying blood to the strip, and using your blood glucose meter. Other Areas to Get Blood for Testing Some meters allow you to use  other areas of your body (other than your finger) to test your blood. These areas are called alternative sites. The most common alternative sites are:  The forearm.  The thigh.  The back area of the lower leg.  The palm of the hand. The blood flow in these areas is slower. Therefore, the blood glucose values you get may be delayed, and the numbers are different from what you would get from your fingers. Do not use alternative sites if you think you are having hypoglycemia. Your reading will not be accurate. Always use a finger if you are having hypoglycemia. Also, if you cannot feel your lows (hypoglycemia unawareness), always use your fingers for your blood glucose checks. ADDITIONAL  TIPS FOR GLUCOSE MONITORING  Do not reuse lancets.  Always carry your supplies with you.  All blood glucose meters have a 24-hour "hotline" number to call if you have questions or need help.  Adjust (calibrate) your blood glucose meter with a control solution after finishing a few boxes of strips. BLOOD GLUCOSE RECORD KEEPING It is a good idea to keep a daily record or log of your blood glucose readings. Most glucose meters, if not all, keep your glucose records stored in the meter. Some meters come with the ability to download your records to your home computer. Keeping a record of your blood glucose readings is especially helpful if you are wanting to look for patterns. Make notes to go along with the blood glucose readings because you might forget what happened at that exact time. Keeping good records helps you and your health care provider to work together to achieve good diabetes management.  Document Released: 03/21/2003 Document Revised: 08/02/2013 Document Reviewed: 08/10/2012 [facility] Patient Information 2015 [place], [place]. This information is not intended to replace advice given to you by your health care provider. Make sure you discuss any questions you have with your health care provider. Diabetes  and Standards of Medical Care Diabetes is complicated. You may find that your diabetes team includes a dietitian, nurse, diabetes educator, eye doctor, and more. To help everyone know what is going on and to help you get the care you deserve, the following schedule of care was developed to help keep you on track. Below are the tests, exams, vaccines, medicines, education, and plans you will need. HbA1c test This test shows how well you have controlled your glucose over the past 2-3 months. It is used to see if your diabetes management plan needs to be adjusted.   It is performed at least 2 times a year if you are meeting treatment goals.  It is performed 4 times a year if therapy has changed or if you are not meeting treatment goals. Blood pressure test  This test is performed at every routine medical visit. The goal is less than 140/90 mm Hg for most people, but 130/80 mm Hg in some cases. Ask your health care provider about your goal. Dental exam  Follow up with the dentist regularly. Eye exam  If you are diagnosed with type 1 diabetes as a child, get an exam upon reaching the age of 76 years or older and have had diabetes for 3-5 years. Yearly eye exams are recommended after that initial eye exam.  If you are diagnosed with type 1 diabetes as an adult, get an exam within 5 years of diagnosis and then yearly.  If you are diagnosed with type 2 diabetes, get an exam as soon as possible after the diagnosis and then yearly. Foot care exam  Visual foot exams are performed at every routine medical visit. The exams check for cuts, injuries, or other problems with the feet.  A comprehensive foot exam should be done yearly. This includes visual inspection as well as assessing foot pulses and testing for loss of sensation.  Check your feet nightly for cuts, injuries, or other problems with your feet. Tell your health care provider if anything is not healing. Kidney function test (urine  microalbumin)  This test is performed once a year.  Type 1 diabetes: The first test is performed 5 years after diagnosis.  Type 2 diabetes: The first test is performed at the time of diagnosis.  A serum creatinine and estimated  glomerular filtration rate (eGFR) test is done once a year to assess the level of chronic kidney disease (CKD), if present. Lipid profile (cholesterol, HDL, LDL, triglycerides)  Performed every 5 years for most people.  The goal for LDL is less than 100 mg/dL. If you are at high risk, the goal is less than 70 mg/dL.  The goal for HDL is 40 mg/dL-50 mg/dL for men and 50 mg/dL-60 mg/dL for women. An HDL cholesterol of 60 mg/dL or higher gives some protection against heart disease.  The goal for triglycerides is less than 150 mg/dL. Influenza vaccine, pneumococcal vaccine, and hepatitis B vaccine  The influenza vaccine is recommended yearly.  It is recommended that people with diabetes who are over 22 years old get the pneumonia vaccine. In some cases, two separate shots may be given. Ask your health care provider if your pneumonia vaccination is up to date.  The hepatitis B vaccine is also recommended for adults with diabetes. Diabetes self-management education  Education is recommended at diagnosis and ongoing as needed. Treatment plan  Your treatment plan is reviewed at every medical visit. Document Released: 01/13/2009 Document Revised: 08/02/2013 Document Reviewed: 08/18/2012 [facility] Patient Information 2015 [place], [place]. This information is not intended to replace advice given to you by your health care provider. Make sure you discuss any questions you have with your health care provider. Diabetes Mellitus and Food It is important for you to manage your blood sugar (glucose) level. Your blood glucose level can be greatly affected by what you eat. Eating healthier foods in the appropriate amounts throughout the day at about the same time each day will  help you control your blood glucose level. It can also help slow or prevent worsening of your diabetes mellitus. Healthy eating may even help you improve the level of your blood pressure and reach or maintain a healthy weight.  HOW CAN FOOD AFFECT ME? Carbohydrates Carbohydrates affect your blood glucose level more than any other type of food. Your dietitian will help you determine how many carbohydrates to eat at each meal and teach you how to count carbohydrates. Counting carbohydrates is important to keep your blood glucose at a healthy level, especially if you are using insulin or taking certain medicines for diabetes mellitus. Alcohol Alcohol can cause sudden decreases in blood glucose (hypoglycemia), especially if you use insulin or take certain medicines for diabetes mellitus. Hypoglycemia can be a life-threatening condition. Symptoms of hypoglycemia (sleepiness, dizziness, and disorientation) are similar to symptoms of having too much alcohol.  If your health care provider has given you approval to drink alcohol, do so in moderation and use the following guidelines:  Women should not have more than one drink per day, and men should not have more than two drinks per day. One drink is equal to:  12 oz of beer.  5 oz of wine.  1 oz of hard liquor.  Do not drink on an empty stomach.  Keep yourself hydrated. Have water, diet soda, or unsweetened iced tea.  Regular soda, juice, and other mixers might contain a lot of carbohydrates and should be counted. WHAT FOODS ARE NOT RECOMMENDED? As you make food choices, it is important to remember that all foods are not the same. Some foods have fewer nutrients per serving than other foods, even though they might have the same number of calories or carbohydrates. It is difficult to get your body what it needs when you eat foods with fewer nutrients. Examples of foods that you  should avoid that are high in calories and carbohydrates but low in  nutrients include:  Trans fats (most processed foods list trans fats on the Nutrition Facts label).  Regular soda.  Juice.  Candy.  Sweets, such as cake, pie, doughnuts, and cookies.  Fried foods. WHAT FOODS CAN I EAT? Have nutrient-rich foods, which will nourish your body and keep you healthy. The food you should eat also will depend on several factors, including:  The calories you need.  The medicines you take.  Your weight.  Your blood glucose level.  Your blood pressure level.  Your cholesterol level. You also should eat a variety of foods, including:  Protein, such as meat, poultry, fish, tofu, nuts, and seeds (lean animal proteins are best).  Fruits.  Vegetables.  Dairy products, such as milk, cheese, and yogurt (low fat is best).  Breads, grains, pasta, cereal, rice, and beans.  Fats such as olive oil, trans fat-free margarine, canola oil, avocado, and olives. DOES EVERYONE WITH DIABETES MELLITUS HAVE THE SAME MEAL PLAN? Because every person with diabetes mellitus is different, there is not one meal plan that works for everyone. It is very important that you meet with a dietitian who will help you create a meal plan that is just right for you. Document Released: 12/13/2004 Document Revised: 03/23/2013 Document Reviewed: 02/12/2013 [facility] Patient Information 2015 [place], [place]. This information is not intended to replace advice given to you by your health care provider. Make sure you discuss any questions you have with your health care provider. Diabetes, Eating Away From Home Sometimes, you might eat in a restaurant or have meals that are prepared by someone else. You can enjoy eating out. However, the portions in restaurants may be much larger than needed. Listed below are some ideas to help you choose foods that will keep your blood glucose (sugar) in better control.  TIPS FOR EATING OUT  Know your meal plan and how many carbohydrate servings you should  have at each meal. You may wish to carry a copy of your meal plan in your purse or wallet. Learn the foods included in each food group.  Make a list of restaurants near you that offer healthy choices. Take a copy of the carry-out menus to see what they offer. Then, you can plan what you will order ahead of time.  Become familiar with serving sizes by practicing them at home using measuring cups and spoons. Once you learn to recognize portion sizes, you will be able to correctly estimate the amount of total carbohydrate you are allowed to eat at the restaurant. Ask for a takeout box if the portion is more than you should have. When your food comes, leave the amount you should have on the plate, and put the rest in the takeout box before you start eating.  Plan ahead if your mealtime will be different from usual. Check with your caregiver to find out how to time meals and medicine if you are taking insulin.  Avoid high-fat foods, such as fried foods, cream sauces, high-fat salad dressings, or any added butter or margarine.  Do not be afraid to ask questions. Ask your server about the portion size, cooking methods, ingredients and if items can be substituted. Restaurants do not list all available items on the menu. You can ask for your main entree to be prepared using skim milk, oil instead of butter or margarine, and without gravy or sauces. Ask your waiter or waitress to serve salad dressings, gravy,  sauces, margarine, and sour cream on the side. You can then add the amount your meal plan suggests.  Add more vegetables whenever possible.  Avoid items that are labeled "jumbo," "giant," "deluxe," or "supersized."  You may want to split an entre with someone and order an extra side salad.  Watch for hidden calories in foods like croutons, bacon, or cheese.  Ask your server to take away the bread basket or chips from your table.  Order a dinner salad as an appetizer. You can eat most foods served  in a restaurant. Some foods are better choices than others. Breads and Starches  Recommended: All kinds of bread (wheat, rye, white, oatmeal, [place], [place], raisin), hard or soft dinner rolls, frankfurter or hamburger buns, small bagels, small corn or whole-wheat flour tortillas.  Avoid: Frosted or glazed breads, butter rolls, egg or cheese breads, croissants, sweet rolls, pastries, coffee cake, glazed or frosted doughnuts, muffins. Crackers  Recommended: Animal crackers, graham, rye, saltine, oyster, and matzoth crackers. Bread sticks, melba toast, rusks, pretzels, popcorn (without fat), zwieback toast.  Avoid: High-fat snack crackers or chips. Buttered popcorn. Cereals  Recommended: Hot and cold cereals. Whole grains such as oatmeal or shredded wheat are good choices.  Avoid: Sugar-coated or granola type cereals. Potatoes/Pasta/Rice/Beans  Recommended: Order baked, boiled, or mashed potatoes, rice or noodles without added fat, whole beans. Order gravies, butter, margarine, or sauces on the side so you can control the amount you add.  Avoid: Hash browns or fried potatoes. Potatoes, pasta, or rice prepared with cream or cheese sauce. Potato or pasta salads prepared with large amounts of dressing. Fried beans or fried rice. Vegetables  Recommended: Order steamed, baked, boiled, or stewed vegetables without sauces or extra fat. Ask that sauce be served on the side. If vegetables are not listed on the menu, ask what is available.  Avoid: Vegetables prepared with cream, butter, or cheese sauce. Fried vegetables. Salad Bars  Recommended: Many of the vegetables at a salad bar are considered "free." Use lemon juice, vinegar, or low-calorie salad dressing (fewer than 20 calories per serving) as "free" dressings for your salad. Look for salad bar ingredients that have no added fat or sugar such as tomatoes, lettuce, cucumbers, broccoli, carrots, onions, and mushrooms.  Avoid: Prepared salads  with large amounts of dressing, such as coleslaw, caesar salad, macaroni salad, bean salad, or carrot salad. Fruit  Recommended: Eat fresh fruit or fresh fruit salad without added dressing. A salad bar often offers fresh fruit choices, but canned fruit at a restaurant is usually packed in sugar or syrup.  Avoid: Sweetened canned or frozen fruits, plain or sweetened fruit juice. Fruit salads with dressing, sour cream, or sugar added to them. Meat and Meat Substitutes  Recommended: Order broiled, baked, roasted, or grilled meat, poultry, or fish. Trim off all visible fat. Do not eat the skin of poultry. The size stated on the menu is the raw weight. Meat shrinks by  in cooking (for example, 4 oz raw equals 3 oz cooked meat).  Avoid: Deep-fat fried meat, poultry, or fish. Breaded meats. Eggs  Recommended: Order soft, hard-cooked, poached, or scrambled eggs. Omelets may be okay, depending on what ingredients are added. Egg substitutes are also a good choice.  Avoid: Fried eggs, eggs prepared with cream or cheese sauce. Milk  Recommended: Order low-fat or fat-free milk according to your meal plan. Plain, nonfat yogurt or flavored yogurt with no sugar added may be used as a substitute for milk. Soy milk  may also be used.  Avoid: Milk shakes or sweetened milk beverages. Soups and Combination Foods  Recommended: Clear broth or consomm are "free" foods and may be used as an appetizer. Broth-based soups with fat removed count as a starch serving and are preferred over cream soups. Soups made with beans or split peas may be eaten but count as a starch.  Avoid: Fatty soups, soup made with cream, cheese soup. Combination foods prepared with excessive amounts of fat or with cream or cheese sauces. Desserts and Sweets  Recommended: Ask for fresh fruit. Sponge or angel food cake without icing, ice milk, no sugar added ice cream, sherbet, or frozen yogurt may fit into your meal plan  occasionally.  Avoid: Pastries, puddings, pies, cakes with icing, custard, gelatin desserts. Fats and Oils  Recommended: Choose healthy fats such as olive oil, canola oil, or tub margarine, reduced fat or fat-free sour cream, cream cheese, avocado, or nuts.  Avoid: Any fats in excess of your allowed portion. Deep-fried foods or any food with a large amount of fat. Note: Ask for all fats to be served on the side, and limit your portion sizes according to your meal plan. Document Released: 03/18/2005 Document Revised: 06/10/2011 Document Reviewed: 06/15/2013 [facility] Patient Information 2015 [place], [place]. This information is not intended to replace advice given to you by your health care provider. Make sure you discuss any questions you have with your health care provider.

## 2014-08-01 NOTE — Progress Notes (Signed)
Subjective:    Patient ID: Robert Wise, male    DOB: [birth year], 54 y.o.   MRN: [id number] Chief Complaint  Patient presents with  . Follow-up    Medication    HPI  Checks cbgs once to twice a day - will often be in 200s first thing in the morning and in the evenings runs 140s-170s.  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 Often will have dinner about 10pm when he gets home.  Will snack on peanut butter crackers all day.  Past Medical History  Diagnosis Date  . Diabetes mellitus without complication   . Hyperlipidemia   . OSA (obstructive sleep apnea) 12/15/2012   Current Outpatient Prescriptions on File Prior to Visit  Medication Sig Dispense Refill  . atorvastatin (LIPITOR) 20 MG tablet TAKE 1 TABLET (20 MG TOTAL) BY MOUTH DAILY. 90 tablet 3  . Blood Glucose Monitoring Suppl (ONE TOUCH ULTRA SYSTEM KIT) W/DEVICE KIT 1 kit by Does not apply route once. 1 each 0  . fish oil-omega-3 fatty acids 1000 MG capsule Take 2 g by mouth daily.    . Lancets (ONETOUCH ULTRASOFT) lancets USE AS INSTRUCTED 100 each 0  . levothyroxine (SYNTHROID, LEVOTHROID) 150 MCG tablet TAKE 1 TABLET (150 MCG TOTAL) BY MOUTH DAILY. 90 tablet 1  . omeprazole (PRILOSEC) 20 MG capsule TAKE 1 CAPSULE (20 MG TOTAL) BY MOUTH DAILY. 90 capsule 3  . ONE TOUCH ULTRA TEST test strip TEST 3 TIMES A DAY 100 each 8  . Saxagliptin-Metformin (KOMBIGLYZE XR) 07-998 MG TB24 Take 1 tablet by mouth daily. 30 tablet 5   No current facility-administered medications on file prior to visit.   Allergies  Allergen Reactions  . Tricor [Fenofibrate]   . Zocor [Simvastatin]     Review of Systems  Constitutional: Negative for fever and chills.  Eyes: Negative for visual disturbance.  Respiratory: Negative for shortness of breath.   Cardiovascular: Negative for chest pain and leg swelling.  Neurological: Negative for dizziness,  syncope, facial asymmetry, weakness, light-headedness and headaches.       Objective:  BP 130/80 mmHg  Pulse 87  Temp(Src) 98.7 F (37.1 C) (Oral)  Resp 16  Ht 5' 10.5" (1.791 m)  Wt 225 lb (102.059 kg)  BMI 31.82 kg/m2  SpO2 97%  Physical Exam  Constitutional: He is oriented to person, place, and time. He appears well-developed and well-nourished. No distress.  HENT:  Head: Normocephalic and atraumatic.  Eyes: Conjunctivae are normal. Pupils are equal, round, and reactive to light. No scleral icterus.  Neck: Normal range of motion. Neck supple. No thyromegaly present.  Cardiovascular: Normal rate, regular rhythm, normal heart sounds and intact distal pulses.   Pulmonary/Chest: Effort normal and breath sounds normal. No respiratory distress.  Musculoskeletal: He exhibits no edema.  Lymphadenopathy:    He has no cervical adenopathy.  Neurological: He is alert and oriented to person, place, and time.  Skin: Skin is warm and dry. He is not diaphoretic.  Psychiatric: He has a normal mood and affect. His behavior is normal.   Sensory exam of the foot is normal, tested with the monofilament. Good pulses, no lesions or ulcers, good peripheral pulses.    Assessment & Plan:  Will need lipitor, levothyroxine, and diabetes med refilled after labs return  1. Type 2 diabetes mellitus with hyperglycemia  - slightly improved from 5  mos prior when a1c 9.5 -> 9 today.  Reminded pt that he needs an optho exam.  Cont asa.  Increase Kombiglyze from 07/998 qd to 2 tabs of 2.5/1000mg qd to max out both metformin and saxagliptin.  Reviewed tlc/diet changes in detail  2. Hypothyroidism, unspecified hypothyroidism type - tsh slightly above normal - cont levothyroxine 150 qd and recheck at f/u OV.    3. Major depressive disorder, recurrent episode, in partial remission - - antidepressant changed to cymbalta and then to effexor this year but pt was doing much better on paxil prior so cross-taper off of  effexor back onto paxil  4. Polypharmacy   5. Hyperlipidemia LDL goal <100 - not fasting today so needs flp at next OV - cont lipitor 20.  6. Gastroesophageal reflux disease, esophagitis presence not specified   7. Screen for STD (sexually transmitted disease) - no risk factors but check screening HIV and Hep C x 1 as rec by [facility].  8. Chronic pain syndrome - no sig improvement in pain when on cymbalta prior  9. Midline low back pain without sciatica - has been on chronic hydrocodone #60/mo with PCP Robert Wise which I will take over - reviewed controlled drug policy - needs f/u OV in 4 mos for any additional refills    Orders Placed This Encounter  Procedures  . Tdap vaccine greater than or equal to 7yo IM  . Pneumococcal polysaccharide vaccine 23-valent greater than or equal to 2yo subcutaneous/IM  . Comprehensive metabolic panel  . TSH  . Microalbumin/Creatinine Ratio, Urine  . HIV antibody  . Hepatitis C antibody  . POCT glycosylated hemoglobin (Hb A1C)  . POCT glucose (manual entry)  . HM DIABETES FOOT EXAM    Meds ordered this encounter  Medications  . HYDROcodone-acetaminophen (NORCO) 5-325 MG per tablet    Sig: Take 1 tablet by mouth every 12 (twelve) hours as needed.    Dispense:  60 tablet    Refill:  0  . PARoxetine (PAXIL) 20 MG tablet    Sig: Take 1 tablet (20 mg total) by mouth daily. May increase to 2 tab qam if needed when off of effexor    Dispense:  90 tablet    Refill:  1  . venlafaxine XR (EFFEXOR-XR) 37.5 MG 24 hr capsule    Sig: Take 1 capsule (37.5 mg total) by mouth daily with breakfast.    Dispense:  30 capsule    Refill:  1  . HYDROcodone-acetaminophen (NORCO/VICODIN) 5-325 MG per tablet    Sig: Take 1 tablet by mouth every 12 (twelve) hours as needed for moderate pain.    Dispense:  60 tablet    Refill:  0    May fill 30 days after date written  . HYDROcodone-acetaminophen (NORCO/VICODIN) 5-325 MG per tablet    Sig: Take 1 tablet by mouth every 12  (twelve) hours as needed for moderate pain.    Dispense:  60 tablet    Refill:  0    May fill 60d after date written  . HYDROcodone-acetaminophen (NORCO/VICODIN) 5-325 MG per tablet    Sig: Take 1 tablet by mouth every 12 (twelve) hours as needed for moderate pain.    Dispense:  60 tablet    Refill:  0    May fill 90 days after date written   Today I have utilized the [place] Controlled Substance Registry's online query to confirm compliance regarding the patient's narcotic pain medications. My review reveals appropriate prescription fills and that  [facility] is the sole provider of these medications. Rechecks will occur regularly and the patient is aware of our use of the system.  Over 40 min spent in face-to-face evaluation of and consultation with patient and coordination of care.  Over 50% of this time was spent counseling this patient.   I personally performed the services described in this documentation, which was scribed in my presence. The recorded information has been reviewed and considered, and addended by me as needed.  Delman Cheadle, MD MPH  Results for orders placed or performed in visit on 08/01/14  Comprehensive metabolic panel  Result Value Ref Range   Sodium 137 135 - 145 mEq/L   Potassium 3.6 3.5 - 5.3 mEq/L   Chloride 100 96 - 112 mEq/L   CO2 25 19 - 32 mEq/L   Glucose, Bld 155 (H) 70 - 99 mg/dL   BUN 10 6 - 23 mg/dL   Creat 0.79 0.50 - 1.35 mg/dL   Total Bilirubin 0.4 0.2 - 1.2 mg/dL   Alkaline Phosphatase 78 39 - 117 U/L   AST 16 0 - 37 U/L   ALT 15 0 - 53 U/L   Total Protein 7.1 6.0 - 8.3 g/dL   Albumin 4.5 3.5 - 5.2 g/dL   Calcium 9.2 8.4 - 10.5 mg/dL  TSH  Result Value Ref Range   TSH 4.760 (H) 0.350 - 4.500 uIU/mL  Microalbumin/Creatinine Ratio, Urine  Result Value Ref Range   Microalb, Ur 2.1 (H) <2.0 mg/dL   Creatinine, Urine 381.5 mg/dL   Microalb Creat Ratio 5.5 0.0 - 30.0 mg/g  HIV antibody  Result Value Ref Range   HIV 1&2 Ab, 4th  Generation NONREACTIVE NONREACTIVE  Hepatitis C antibody  Result Value Ref Range   HCV Ab NEGATIVE NEGATIVE  POCT glycosylated hemoglobin (Hb A1C)  Result Value Ref Range   Hemoglobin A1C 9.0   POCT glucose (manual entry)  Result Value Ref Range   POC Glucose 159 (A) 70 - 99 mg/dl

## 2014-08-02 LAB — TSH: TSH: 4.76 u[IU]/mL — ABNORMAL HIGH (ref 0.350–4.500)

## 2014-08-02 LAB — COMPREHENSIVE METABOLIC PANEL
ALT: 15 U/L (ref 0–53)
AST: 16 U/L (ref 0–37)
Albumin: 4.5 g/dL (ref 3.5–5.2)
Alkaline Phosphatase: 78 U/L (ref 39–117)
BUN: 10 mg/dL (ref 6–23)
CO2: 25 mEq/L (ref 19–32)
Calcium: 9.2 mg/dL (ref 8.4–10.5)
Chloride: 100 mEq/L (ref 96–112)
Creat: 0.79 mg/dL (ref 0.50–1.35)
Glucose, Bld: 155 mg/dL — ABNORMAL HIGH (ref 70–99)
Potassium: 3.6 mEq/L (ref 3.5–5.3)
Sodium: 137 mEq/L (ref 135–145)
Total Bilirubin: 0.4 mg/dL (ref 0.2–1.2)
Total Protein: 7.1 g/dL (ref 6.0–8.3)

## 2014-08-02 LAB — HEPATITIS C ANTIBODY: HCV Ab: NEGATIVE

## 2014-08-03 ENCOUNTER — Encounter: Payer: Self-pay | Admitting: Family Medicine

## 2014-08-03 LAB — MICROALBUMIN / CREATININE URINE RATIO
Creatinine, Urine: 381.5 mg/dL
Microalb Creat Ratio: 5.5 mg/g (ref 0.0–30.0)
Microalb, Ur: 2.1 mg/dL — ABNORMAL HIGH (ref <2.0)

## 2014-08-03 LAB — HIV ANTIBODY (ROUTINE TESTING W REFLEX): HIV 1&2 Ab, 4th Generation: NONREACTIVE

## 2014-08-03 MED ORDER — LEVOTHYROXINE SODIUM 150 MCG PO TABS: 150.0000 ug | ORAL_TABLET | Freq: Every day | ORAL | Status: DC

## 2014-08-03 MED ORDER — SAXAGLIPTIN-METFORMIN ER 2.5-1000 MG PO TB24: 2.0000 | ORAL_TABLET | Freq: Every day | ORAL | Status: DC

## 2014-09-10 ENCOUNTER — Other Ambulatory Visit: Payer: Self-pay | Admitting: Physician Assistant

## 2014-09-24 ENCOUNTER — Other Ambulatory Visit: Payer: Self-pay | Admitting: Family Medicine

## 2014-10-12 ENCOUNTER — Ambulatory Visit (INDEPENDENT_AMBULATORY_CARE_PROVIDER_SITE_OTHER): Payer: BLUE CROSS/BLUE SHIELD | Admitting: Urgent Care

## 2014-10-12 ENCOUNTER — Telehealth: Payer: Self-pay | Admitting: Family Medicine

## 2014-10-12 VITALS — BP 142/88 | HR 75 | Temp 98.1°F | Resp 18 | Ht 70.5 in | Wt 225.0 lb

## 2014-10-12 DIAGNOSIS — F329 Major depressive disorder, single episode, unspecified: Secondary | ICD-10-CM | POA: Diagnosis not present

## 2014-10-12 DIAGNOSIS — F32A Depression, unspecified: Secondary | ICD-10-CM | POA: Insufficient documentation

## 2014-10-12 NOTE — Telephone Encounter (Signed)
lmom to call back to set up an appt with [facility]  Please schedule patient to follow up with me at appointment center in ~4 weeks for depression.

## 2014-10-12 NOTE — Progress Notes (Signed)
MRN: [id number] DOB: 14-Jul-1960  Subjective:   Robert Wise is a 54 y.o. male presenting for chief complaint of Depression and Anxiety  Reports that he has a long-standing diagnosis of depression. Patient was recently managed with Paxil 10 mg and his wife states that he is doing well with this. Last year he was treated for RMSF and was taken off Paxil. Eventually he was put back on Paxil but he was also started on Effexor. Since then the patient and his wife both report that he has had a difficult time with his depression. In the last couple of weeks patient has also experienced increased chest and anxiety with his mother's health. He reports that she has experienced falls and her health is very fragile at 54 years old. He is very close to her, became very tearful when talking about this. His wife states that in the last 2 weeks he's also had difficult time getting out of bed, had crying spells and overall just feels that he is really depressed. Patient reports no motivation and is generally not enjoying anything that he used to enjoy. He also experiences significant stress at work, works about 50 hours each week and has not had any time off for the past year. He feels like he has to work because his pulse won't understand nor give him any time off for his health. Today, the patient and his wife would like counseling on his depression management. Denies any other aggravating or relieving factors, no other questions or concerns.  Robert Wise has a current medication list which includes the following prescription(s): atorvastatin, one touch ultra system kit, fish oil-omega-3 fatty acids, glipizide, hydrocodone-acetaminophen, onetouch ultrasoft, levothyroxine, omeprazole, one touch ultra test, paroxetine, venlafaxine xr, hydrocodone-acetaminophen, hydrocodone-acetaminophen, hydrocodone-acetaminophen, and saxagliptin-metformin. He is allergic to tricor and zocor.  Robert Wise  has a past medical history of Diabetes  mellitus without complication; Hyperlipidemia; OSA (obstructive sleep apnea) (12/15/2012); Depression; and Anxiety. Also  has past surgical history that includes Ankle surgery (Left, 2006) and Vasectomy.  ROS As in subjective.  Objective:   Vitals: BP 142/88 mmHg  Pulse 75  Temp(Src) 98.1 F (36.7 C) (Oral)  Resp 18  Ht 5' 10.5" (1.791 m)  Wt 225 lb (102.059 kg)  BMI 31.82 kg/m2  SpO2 98%  Physical Exam  Constitutional: He is oriented to person, place, and time. He appears well-developed and well-nourished.  Neck: Normal range of motion. Neck supple. No thyromegaly present.  Cardiovascular: Normal rate, regular rhythm and intact distal pulses.  Exam reveals no gallop and no friction rub.   No murmur heard. Pulmonary/Chest: No respiratory distress. He has no wheezes. He has no rales.  Abdominal: Soft. Bowel sounds are normal. He exhibits no distension and no mass. There is no tenderness.  Neurological: He is alert and oriented to person, place, and time.  Skin: Skin is warm and dry. No rash noted. No erythema. No pallor.  Psychiatric: His mood appears not anxious. His affect is not angry, not blunt, not labile and not inappropriate. His speech is not rapid and/or pressured and not slurred. He is slowed. He is not agitated, not aggressive, not hyperactive, not withdrawn and not combative. He exhibits a depressed mood (flat affect, slowed speech). He expresses no homicidal and no suicidal ideation. He is attentive.   Assessment and Plan :   1. Depression - Stop Effexor, increase to 1 1/2 tablets of Paxil in 2 days, wrote patient out of work for 1 week, follow up with  Robert Wise at that time. I expect he should be able to return to work at that time. Goal is to increase patient's dose of Paxil to 2 tablets (52m) daily. Follow up in 4 weeks for this.  MJaynee Eagles PA-C [facility] [phone]/13/2016 10:51 AM

## 2014-10-12 NOTE — Patient Instructions (Addendum)
-   Please contact [facility] to set up an appointment for therapy. Britta Mccreedy- Barbara and/or Darl PikesSusan can be of great help to you. - Below is their contact information:  [address]  [place], [place] [address]  Phone: [phone]  Fax: [phone]  - Please stop Effexor. - On Friday, 10/14/2014, you may increase your dose of Paxil to 1 1/2 tablets on Friday. - After 1 week, increase to 2 tablets daily. - Follow up in 4 weeks.

## 2014-10-18 ENCOUNTER — Telehealth: Payer: Self-pay | Admitting: Urgent Care

## 2014-10-18 MED ORDER — ONDANSETRON 8 MG PO TBDP: 8.0000 mg | ORAL_TABLET | Freq: Three times a day (TID) | ORAL | Status: DC | PRN

## 2014-10-18 MED ORDER — PAROXETINE HCL 40 MG PO TABS: 40.0000 mg | ORAL_TABLET | ORAL | Status: DC

## 2014-10-18 MED ORDER — ALPRAZOLAM 0.5 MG PO TABS: 0.2500 mg | ORAL_TABLET | Freq: Two times a day (BID) | ORAL | Status: DC | PRN

## 2014-10-18 NOTE — Telephone Encounter (Signed)
Spoke with patient's therapist, Shanon RosserBarbara Farran, with [facility]. After her evaluation, she discussed with me that our mutual patient also has anxiety with his depression and even experiences anxiety attacks especially revolving around his work and stress with family. She recommended that I review patient's medications and consider Xanax for panic attacks. I discussed these with patient; currently, he is taking 1-2 pills of Norco each night for chronic pain syndrome. I suspect that this may be a manifestation of his depression in the absence of a specific diagnosis. I discussed with patient potential for adverse effects and advised that he take 1/2-1 tablet of 0.5mg  Xanax for panic attacks only up to twice a day. He will continue to meet with Britta MccreedyBarbara to work on his anxiety and depression and will f/u with me for medical therapy. He is to schedule an appt for f/u of his depression in 4 weeks. For now, I will provide refills for Paxil 40mg .

## 2014-10-21 ENCOUNTER — Other Ambulatory Visit: Payer: Self-pay | Admitting: Physician Assistant

## 2014-10-21 NOTE — Telephone Encounter (Signed)
Robert Wise, pt is not taking this anymore? Or does he need more to titrate down?

## 2014-10-21 NOTE — Telephone Encounter (Signed)
Patient is not supposed to be on this anymore. I discussed treatment plan for depression and anxiety with him in clinic. This is a very low dose and he was on another SSRI in addition to this. So, no he's not supposed to be on two SSRI's, he stopped Effexor and we're going with just Paxil. Thank you!

## 2014-10-22 ENCOUNTER — Other Ambulatory Visit: Payer: Self-pay | Admitting: Physician Assistant

## 2014-12-01 ENCOUNTER — Encounter: Payer: Self-pay | Admitting: Urgent Care

## 2014-12-01 ENCOUNTER — Telehealth: Payer: Self-pay | Admitting: Urgent Care

## 2014-12-01 ENCOUNTER — Ambulatory Visit (INDEPENDENT_AMBULATORY_CARE_PROVIDER_SITE_OTHER): Payer: BLUE CROSS/BLUE SHIELD | Admitting: Urgent Care

## 2014-12-01 VITALS — BP 114/80 | HR 83 | Temp 98.3°F | Resp 16 | Ht 70.0 in | Wt 231.8 lb

## 2014-12-01 DIAGNOSIS — F329 Major depressive disorder, single episode, unspecified: Secondary | ICD-10-CM | POA: Diagnosis not present

## 2014-12-01 DIAGNOSIS — E1165 Type 2 diabetes mellitus with hyperglycemia: Secondary | ICD-10-CM | POA: Diagnosis not present

## 2014-12-01 DIAGNOSIS — E785 Hyperlipidemia, unspecified: Secondary | ICD-10-CM | POA: Diagnosis not present

## 2014-12-01 DIAGNOSIS — IMO0002 Reserved for concepts with insufficient information to code with codable children: Secondary | ICD-10-CM

## 2014-12-01 DIAGNOSIS — E039 Hypothyroidism, unspecified: Secondary | ICD-10-CM | POA: Diagnosis not present

## 2014-12-01 DIAGNOSIS — G8929 Other chronic pain: Secondary | ICD-10-CM | POA: Insufficient documentation

## 2014-12-01 DIAGNOSIS — Z23 Encounter for immunization: Secondary | ICD-10-CM | POA: Diagnosis not present

## 2014-12-01 DIAGNOSIS — F32A Depression, unspecified: Secondary | ICD-10-CM

## 2014-12-01 LAB — POCT GLYCOSYLATED HEMOGLOBIN (HGB A1C): Hemoglobin A1C: 9.7

## 2014-12-01 MED ORDER — OMEPRAZOLE 20 MG PO CPDR: 20.0000 mg | DELAYED_RELEASE_CAPSULE | Freq: Every day | ORAL | Status: DC

## 2014-12-01 MED ORDER — HYDROCODONE-ACETAMINOPHEN 5-325 MG PO TABS: 1.0000 | ORAL_TABLET | Freq: Two times a day (BID) | ORAL | Status: DC | PRN

## 2014-12-01 MED ORDER — GLUCOSE BLOOD VI STRP: 1.0000 | ORAL_STRIP | Freq: Every day | Status: DC

## 2014-12-01 NOTE — Telephone Encounter (Signed)
Patient's therapist, Shanon Rosser, contacted me to let me know that Robert Wise is not doing well. He is undergoing severe depression despite  Paxil. Per Britta Mccreedy his wife initiated contacted out of concern for Ritesh's debilitating depression including multiple crying spells, difficulty continuing his work. Britta Mccreedy advised patient to increase his Paxil from  to  for short term until his visit with me later today. She wanted to make me aware of this change. I will see him in clinic today or contact him by phone if he does not show.

## 2014-12-01 NOTE — Progress Notes (Signed)
MRN: [id number] DOB: 09-07-1960  Subjective:   Robert Wise is a 54 y.o. male presenting for chief complaint of Follow-up and Medication Refill  Depression/anxiety - patient is presenting for followup on depression, currently managed with 67m of Paxil. Patient has taken this for several years and report status only depression medication that has worked for him. However for the better part of a year patient has been in worsening depression. He has started therapy with Robert Wise she contacted me early this week to let me know he was not doing well in that she had recommended he increase his Paxil to 60 mg short-term for this week only. Today, patient reports that he has had crying spells, no energy, depressed mood, fatigue, lack of motivation, anhedonia and difficulty concentrating and completing his tasks at work. He feels like he is moving slower and talking more slowly than usual. His wife is also worried about him and both feel that he is not his normal self by long shot. Patient has also had ongoing shortness with his work, specifically his employer is very demanding with him. However, patient is not planning on getting a different job. This has led to increased anxiety and he has been using Xanax as well 0.25 mg twice a day as needed. He denies suicidal or homicidal ideation but does admit he has had occasional general thoughts about death. He denies decreased appetite, insomnia or hypersomnia.   Diabetes - blood sugar ranges in 200's (not fasting) in the morning. She is currently taking glipizide. States that he stopped his metformin-saxagliptin because it was not helping as well with his diabetes as glipizide. Drinks water daily, tries to stay hydrated. He generally eats small meals throughout the day but does not actively try to eat healthier avoid carbohydrates. Denies lightheadedness, blurred vision, polyuria, polydipsia, nausea, vomiting, abdominal pain, numbness, tingling. Denies  foot ulcers or wounds that won't heal.  Cholesterol - diet as above. Does not exercise. Has been taking atorvastatin without experiencing any adverse effects.  Hypothyroidism - taking levothyroxine 150 mcg. Patient has done well with this medication and denies constipation, hair thinning, cold intolerance. Review of systems as above otherwise.  Chronic pain - toward the end of his visit, patient requested refill of hydrocodone 5-325. Patient has been taking this for several years now. Started this after an ankle surgery and was ever taken off of this. 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 He does not know why he was continued with it. He takes at night to help with sleep and also to help with generalized pain. Of note, patient has a history of alcoholism and used to attend AA meetings. He did not know the risk of using opioid medication with his history of alcoholism.  Denies any other aggravating or relieving factors, no other questions or concerns.  DEsgarhas a current medication list which includes the following prescription(s): alprazolam, atorvastatin, fish oil-omega-3 fatty acids, glipizide, hydrocodone-acetaminophen, levothyroxine, omeprazole, paroxetine, one touch ultra system kit, hydrocodone-acetaminophen, hydrocodone-acetaminophen, hydrocodone-acetaminophen, onetouch ultrasoft, levothyroxine, ondansetron, one touch ultra test, and saxagliptin-metformin. Also is allergic to tricor and zocor.  DBrittian has a past medical history of Diabetes mellitus without complication; Hyperlipidemia; OSA (obstructive sleep apnea) (12/15/2012); Depression; and Anxiety. Also  has past surgical history that includes Ankle surgery (Left, 2006) and Vasectomy.  Objective:   Vitals: BP 114/80 mmHg  Pulse 83  Temp(Src) 98.3 F (36.8 C) (Oral)  Resp 16  Ht _0  (1.778 m)  Wt 231 lb 12.8 oz (105.144 kg)  BMI 33.26 kg/m2  Physical Exam  Constitutional: He is oriented to person,  place, and time. He appears well-developed and well-nourished.  HENT:  Mouth/Throat: Oropharynx is clear and moist.  Eyes: Pupils are equal, round, and reactive to light. No scleral icterus.  Neck: Normal range of motion. Neck supple. No thyromegaly present.  Cardiovascular: Normal rate, regular rhythm and intact distal pulses.  Exam reveals no gallop and no friction rub.   No murmur heard. Pulmonary/Chest: No respiratory distress. He has no wheezes. He has no rales.  Abdominal: Soft. Bowel sounds are normal. He exhibits no distension and no mass. There is no tenderness.  Musculoskeletal: Normal range of motion. He exhibits no edema or tenderness.  Lymphadenopathy:    He has no cervical adenopathy.  Neurological: He is alert and oriented to person, place, and time.  Skin: Skin is warm and dry. No rash noted. No erythema. No pallor.  Psychiatric: His mood appears not anxious. His affect is not blunt and not labile. His speech is not rapid and/or pressured and not slurred. He is slowed. He is not aggressive. He exhibits a depressed mood. He expresses no homicidal and no suicidal ideation.    Assessment and Plan :   1. Depression - Patient is undergoing major depression with mixed anxiety. His therapist Robert Wise stated that he is not a good candidate for therapy given that he is not consistent with followup. I discussed with patient the need to come off of Paxil since he has failed treatment with this medication. Patient agreed, we will wean him off over the period of 3 weeks. 2 weeks from today, we will start him on Cymbalta 30 mg to help with both his depression and pain. I aim to increase his dose to 44m depending on tolerance of his medications. I recommended close followup with patient, he is to return to clinic on 12/12/2014. He contracted for safety and stated that he would do his best to come for followup.  2. Diabetes type 2, uncontrolled - Uncontrolled, restart Metformin, continue  glipizide. Recheck in 3 months. - glucose blood (ONE TOUCH ULTRA TEST) test strip; 1 each by Other route daily.  Dispense: 100 each; Refill: 8 - POCT glycosylated hemoglobin (Hb A1C) - Comprehensive metabolic panel  3. Hyperlipidemia - Not controlled, will refill as appropriate - Lipid panel  4. Hypothyroidism, unspecified hypothyroidism type - Thyroid Panel With TSH pending, will refill as appropriate  5. Chronic pain - I was very clear with patient that my intention is to wean him off of opioid medication. Patient was in agreement given his history of alcoholism and undifferentiated pain without a clear indication for opioid medication. I do intend on working his pain up as needed. However, was not able to do so today since patient requested refill of opiate medication at the end of his visit. - HYDROcodone-acetaminophen (NORCO/VICODIN) 5-325 MG per tablet; Take 1 tablet by mouth every 12 (twelve) hours as needed for moderate pain.  Dispense: 60 tablet; Refill: 0  6. Need for immunization against influenza - Flu Vaccine QUAD 36+ mos IM   MJaynee Eagles PA-C [facility] [phone]/04/2014 5:15 PM

## 2014-12-01 NOTE — Patient Instructions (Addendum)
Depression Depression refers to feeling sad, low, down in the dumps, blue, gloomy, or empty. In general, there are two kinds of depression:  Normal sadness or normal grief. This kind of depression is one that we all feel from time to time after upsetting life experiences, such as the loss of a job or the ending of a relationship. This kind of depression is considered normal, is short lived, and resolves within a few days to 2 weeks. Depression experienced after the loss of a loved one (bereavement) often lasts longer than 2 weeks but normally gets better with time.  Clinical depression. This kind of depression lasts longer than normal sadness or normal grief or interferes with your ability to function at home, at work, and in school. It also interferes with your personal relationships. It affects almost every aspect of your life. Clinical depression is an illness. Symptoms of depression can also be caused by conditions other than those mentioned above, such as:  Physical illness. Some physical illnesses, including underactive thyroid gland (hypothyroidism), severe anemia, specific types of cancer, diabetes, uncontrolled seizures, heart and lung problems, strokes, and chronic pain are commonly associated with symptoms of depression.  Side effects of some prescription medicine. In some people, certain types of medicine can cause symptoms of depression.  Substance abuse. Abuse of alcohol and illicit drugs can cause symptoms of depression. SYMPTOMS Symptoms of normal sadness and normal grief include the following:  Feeling sad or crying for short periods of time.  Not caring about anything (apathy).  Difficulty sleeping or sleeping too much.  No longer able to enjoy the things you used to enjoy.  Desire to be by oneself all the time (social isolation).  Lack of energy or motivation.  Difficulty concentrating or remembering.  Change in appetite or weight.  Restlessness or  agitation. Symptoms of clinical depression include the same symptoms of normal sadness or normal grief and also the following symptoms:  Feeling sad or crying all the time.  Feelings of guilt or worthlessness.  Feelings of hopelessness or helplessness.  Thoughts of suicide or the desire to harm yourself (suicidal ideation).  Loss of touch with reality (psychotic symptoms). Seeing or hearing things that are not real (hallucinations) or having false beliefs about your life or the people around you (delusions and paranoia). DIAGNOSIS  The diagnosis of clinical depression is usually based on how bad the symptoms are and how long they have lasted. Your health care provider will also ask you questions about your medical history and substance use to find out if physical illness, use of prescription medicine, or substance abuse is causing your depression. Your health care provider may also order blood tests. TREATMENT  Often, normal sadness and normal grief do not require treatment. However, sometimes antidepressant medicine is given for bereavement to ease the depressive symptoms until they resolve. The treatment for clinical depression depends on how bad the symptoms are but often includes antidepressant medicine, counseling with a mental health professional, or both. Your health care provider will help to determine what treatment is best for you. Depression caused by physical illness usually goes away with appropriate medical treatment of the illness. If prescription medicine is causing depression, talk with your health care provider about stopping the medicine, decreasing the dose, or changing to another medicine. Depression caused by the abuse of alcohol or illicit drugs goes away when you stop using these substances. Some adults need professional help in order to stop drinking or using drugs. [place]  CARE IF:  You have thoughts about hurting yourself or others.  You lose touch  with reality (have psychotic symptoms).  You are taking medicine for depression and have a serious side effect. FOR MORE INFORMATION  National Alliance on Mental Illness: www.nami.[employer] of Mental Health: http://www.maynard.net/ Document Released: 03/15/2000 Document Revised: 08/02/2013 Document Reviewed: 06/17/2011 [facility] Patient Information 2015 [place], [place]. This information is not intended to replace advice given to you by your health care provider. Make sure you discuss any questions you have with your health care provider.   Diabetes Mellitus and Food It is important for you to manage your blood sugar (glucose) level. Your blood glucose level can be greatly affected by what you eat. Eating healthier foods in the appropriate amounts throughout the day at about the same time each day will help you control your blood glucose level. It can also help slow or prevent worsening of your diabetes mellitus. Healthy eating may even help you improve the level of your blood pressure and reach or maintain a healthy weight.  HOW CAN FOOD AFFECT ME? Carbohydrates Carbohydrates affect your blood glucose level more than any other type of food. Your dietitian will help you determine how many carbohydrates to eat at each meal and teach you how to count carbohydrates. Counting carbohydrates is important to keep your blood glucose at a healthy level, especially if you are using insulin or taking certain medicines for diabetes mellitus. Alcohol Alcohol can cause sudden decreases in blood glucose (hypoglycemia), especially if you use insulin or take certain medicines for diabetes mellitus. Hypoglycemia can be a life-threatening condition. Symptoms of hypoglycemia (sleepiness, dizziness, and disorientation) are similar to symptoms of having too much alcohol.  If your health care provider has given you approval to drink alcohol, do so in moderation and use the following guidelines:  Women should  not have more than one drink per day, and men should not have more than two drinks per day. One drink is equal to:  12 oz of beer.  5 oz of wine.  1 oz of hard liquor.  Do not drink on an empty stomach.  Keep yourself hydrated. Have water, diet soda, or unsweetened iced tea.  Regular soda, juice, and other mixers might contain a lot of carbohydrates and should be counted. WHAT FOODS ARE NOT RECOMMENDED? As you make food choices, it is important to remember that all foods are not the same. Some foods have fewer nutrients per serving than other foods, even though they might have the same number of calories or carbohydrates. It is difficult to get your body what it needs when you eat foods with fewer nutrients. Examples of foods that you should avoid that are high in calories and carbohydrates but low in nutrients include:  Trans fats (most processed foods list trans fats on the Nutrition Facts label).  Regular soda.  Juice.  Candy.  Sweets, such as cake, pie, doughnuts, and cookies.  Fried foods. WHAT FOODS CAN I EAT? Have nutrient-rich foods, which will nourish your body and keep you healthy. The food you should eat also will depend on several factors, including:  The calories you need.  The medicines you take.  Your weight.  Your blood glucose level.  Your blood pressure level.  Your cholesterol level. You also should eat a variety of foods, including:  Protein, such as meat, poultry, fish, tofu, nuts, and seeds (lean animal proteins are best).  Fruits.  Vegetables.  Dairy products, such as milk,  cheese, and yogurt (low fat is best).  Breads, grains, pasta, cereal, rice, and beans.  Fats such as olive oil, trans fat-free margarine, canola oil, avocado, and olives. DOES EVERYONE WITH DIABETES MELLITUS HAVE THE SAME MEAL PLAN? Because every person with diabetes mellitus is different, there is not one meal plan that works for everyone. It is very important that  you meet with a dietitian who will help you create a meal plan that is just right for you. Document Released: 12/13/2004 Document Revised: 03/23/2013 Document Reviewed: 02/12/2013 [facility] Patient Information 2015 [place], [place]. This information is not intended to replace advice given to you by your health care provider. Make sure you discuss any questions you have with your health care provider.

## 2014-12-02 ENCOUNTER — Telehealth: Payer: Self-pay | Admitting: Urgent Care

## 2014-12-02 LAB — COMPREHENSIVE METABOLIC PANEL
ALT: 15 U/L (ref 9–46)
AST: 15 U/L (ref 10–35)
Albumin: 4.4 g/dL (ref 3.6–5.1)
Alkaline Phosphatase: 78 U/L (ref 40–115)
BUN: 10 mg/dL (ref 7–25)
CO2: 26 mmol/L (ref 20–31)
Calcium: 9.2 mg/dL (ref 8.6–10.3)
Chloride: 99 mmol/L (ref 98–110)
Creat: 0.82 mg/dL (ref 0.70–1.33)
Glucose, Bld: 206 mg/dL — ABNORMAL HIGH (ref 65–99)
Potassium: 4.4 mmol/L (ref 3.5–5.3)
Sodium: 136 mmol/L (ref 135–146)
Total Bilirubin: 0.5 mg/dL (ref 0.2–1.2)
Total Protein: 7.1 g/dL (ref 6.1–8.1)

## 2014-12-02 LAB — LIPID PANEL
Cholesterol: 160 mg/dL (ref 125–200)
HDL: 23 mg/dL — ABNORMAL LOW (ref >=40)
LDL Cholesterol: 80 mg/dL (ref <130)
Total CHOL/HDL Ratio: 7 Ratio — ABNORMAL HIGH (ref <=5.0)
Triglycerides: 287 mg/dL — ABNORMAL HIGH (ref <150)
VLDL: 57 mg/dL — ABNORMAL HIGH (ref <30)

## 2014-12-02 LAB — THYROID PANEL WITH TSH
Free Thyroxine Index: 2.1 (ref 1.4–3.8)
T3 Uptake: 26 % (ref 22–35)
T4, Total: 7.9 ug/dL (ref 4.5–12.0)
TSH: 2.735 u[IU]/mL (ref 0.350–4.500)

## 2014-12-02 MED ORDER — DULOXETINE HCL 30 MG PO CPEP: 30.0000 mg | ORAL_CAPSULE | Freq: Every day | ORAL | Status: DC

## 2014-12-02 MED ORDER — PAROXETINE HCL 20 MG PO TABS: 20.0000 mg | ORAL_TABLET | Freq: Every day | ORAL | Status: DC

## 2014-12-02 MED ORDER — PAROXETINE HCL 40 MG PO TABS: 40.0000 mg | ORAL_TABLET | ORAL | Status: DC

## 2014-12-02 MED ORDER — ATORVASTATIN CALCIUM 20 MG PO TABS: ORAL_TABLET | ORAL | Status: DC

## 2014-12-02 MED ORDER — METFORMIN HCL 1000 MG PO TABS: 1000.0000 mg | ORAL_TABLET | Freq: Two times a day (BID) | ORAL | Status: DC

## 2014-12-02 MED ORDER — LEVOTHYROXINE SODIUM 150 MCG PO TABS: 150.0000 ug | ORAL_TABLET | Freq: Every day | ORAL | Status: DC

## 2014-12-02 NOTE — Telephone Encounter (Signed)
Called patient to let him know that I sent refills for his thyroid and cholesterol medications. I also sent script for tapering Paxil over 3 weeks and for Cymbalta  to start. I will increase this to  after 2 weeks. Patient is also to restart Metformin 1,000mg  BID. I will try to follow up with him on 12/12/2014. Patient agreed to make an effort to come for follow up then.

## 2014-12-14 ENCOUNTER — Other Ambulatory Visit: Payer: Self-pay | Admitting: Urgent Care

## 2014-12-14 ENCOUNTER — Telehealth: Payer: Self-pay

## 2014-12-14 MED ORDER — PAROXETINE HCL 40 MG PO TABS: 40.0000 mg | ORAL_TABLET | ORAL | Status: DC

## 2014-12-14 MED ORDER — PAROXETINE HCL 10 MG PO TABS: 10.0000 mg | ORAL_TABLET | Freq: Every day | ORAL | Status: DC

## 2014-12-14 NOTE — Telephone Encounter (Signed)
Pt would like a call back from [facility] regarding some medication they discussed. Please call [phone]

## 2014-12-14 NOTE — Telephone Encounter (Addendum)
Patient has not followed our plan is discussed in clinic regarding his depression and anxiety medication. Patient has continued to take 60 mg of Paxil daily, he has not started Cymbalta. He is reporting today that he has done better regarding his mood and anxiety in the past couple of weeks and again is hesitant to come off of Paxil. He has now been on Paxil for about 3 weeks at 60 mg. I reminded patient bowel this medication has not helped him with his depression for the past year but patient is still very hesitant to come off the Paxil. He was supposed to come back for followup on 12/12/2014 but he did not do so due to financial restraints. The same reason he has not been able to look into seeing a psychiatrist. He has been back to see her apparently, his therapist. I discussed with patient the fact that we need to try the max dose of Paxil which is 50 mg. He has not done this and went straight to . I will have him try this dose for 2 more weeks and if he does not do well, we have to try a different antidepressant. Patient is to follow up with in at the walk in clinic in 2 weeks. He agreed to come on one of his days off.

## 2014-12-14 NOTE — Telephone Encounter (Signed)
Mani Please Advise.

## 2014-12-20 ENCOUNTER — Other Ambulatory Visit: Payer: Self-pay | Admitting: Family Medicine

## 2014-12-20 NOTE — Telephone Encounter (Signed)
Is it ok to refill her thyroid meds?

## 2014-12-20 NOTE — Telephone Encounter (Signed)
I had already sent refill. See phone encounter from 12/02/2014. I gave him a 6 month supply and brings this up as a duplicate order when I try to sign. Not sure what happened with this but please check with his pharmacy.

## 2014-12-24 ENCOUNTER — Other Ambulatory Visit: Payer: Self-pay | Admitting: Family Medicine

## 2014-12-26 ENCOUNTER — Ambulatory Visit (INDEPENDENT_AMBULATORY_CARE_PROVIDER_SITE_OTHER): Payer: BLUE CROSS/BLUE SHIELD | Admitting: Urgent Care

## 2014-12-26 VITALS — BP 158/70 | HR 71 | Temp 98.0°F | Resp 18 | Ht 70.0 in | Wt 229.0 lb

## 2014-12-26 DIAGNOSIS — F32A Depression, unspecified: Secondary | ICD-10-CM

## 2014-12-26 DIAGNOSIS — F329 Major depressive disorder, single episode, unspecified: Secondary | ICD-10-CM

## 2014-12-26 DIAGNOSIS — E1165 Type 2 diabetes mellitus with hyperglycemia: Secondary | ICD-10-CM

## 2014-12-26 DIAGNOSIS — IMO0002 Reserved for concepts with insufficient information to code with codable children: Secondary | ICD-10-CM

## 2014-12-26 DIAGNOSIS — F419 Anxiety disorder, unspecified: Secondary | ICD-10-CM

## 2014-12-26 DIAGNOSIS — G8929 Other chronic pain: Secondary | ICD-10-CM

## 2014-12-26 MED ORDER — CYCLOBENZAPRINE HCL 10 MG PO TABS: 10.0000 mg | ORAL_TABLET | Freq: Every day | ORAL | Status: DC

## 2014-12-26 NOTE — Patient Instructions (Addendum)
Week 1/Day 1-7: take  of Paxil daily. Week 2/Day 8-14: take  of Paxil daily (1/2 tablet of , 1 tablet ). Week 3/Day 15-21: start Cymbalta  daily. Take  of Paxil daily (1/2 tablet of ). Week 4/Day 22-28: continue Cymbalta  daily. Take  of Paxil daily. Week 5/Day 29-35: increase Cymbalta to  daily. Stop Paxil.  For sleep use Benadryl .   Duloxetine delayed-release capsules What is this medicine? DULOXETINE (doo LOX e teen) is used to treat depression, anxiety, and different types of chronic pain. This medicine may be used for other purposes; ask your health care provider or pharmacist if you have questions. COMMON BRAND NAME(S): Cymbalta What should I tell my health care provider before I take this medicine? They need to know if you have any of these conditions: -bipolar disorder or a family history of bipolar disorder -glaucoma -kidney disease -liver disease -suicidal thoughts or a previous suicide attempt -taken medicines called MAOIs like Carbex, Eldepryl, Marplan, Nardil, and Parnate within 14 days -an unusual reaction to duloxetine, other medicines, foods, dyes, or preservatives -pregnant or trying to get pregnant -breast-feeding How should I use this medicine? Take this medicine by mouth with a glass of water. Follow the directions on the prescription label. Do not cut, crush or chew this medicine. You can take this medicine with or without food. Take your medicine at regular intervals. Do not take your medicine more often than directed. Do not stop taking this medicine suddenly except upon the advice of your doctor. Stopping this medicine too quickly may cause serious side effects or your condition may worsen. A special MedGuide will be given to you by the pharmacist with each prescription and refill. Be sure to read this information carefully each time. Talk to your pediatrician regarding the use of this medicine in children. While this  drug may be prescribed for children as young as 33 years of age for selected conditions, precautions do apply. Overdosage: If you think you have taken too much of this medicine contact a poison control center or emergency room at once. NOTE: This medicine is only for you. Do not share this medicine with others. What if I miss a dose? If you miss a dose, take it as soon as you can. If it is almost time for your next dose, take only that dose. Do not take double or extra doses. What may interact with this medicine? Do not take this medicine with any of the following medications: -certain diet drugs like dexfenfluramine, fenfluramine -desvenlafaxine -linezolid -MAOIs like Azilect, Carbex, Eldepryl, Marplan, Nardil, and Parnate -methylene blue (intravenous) -milnacipran -thioridazine -venlafaxine This medicine may also interact with the following medications: -alcohol -aspirin and aspirin-like medicines -certain antibiotics like ciprofloxacin and enoxacin -certain medicines for blood pressure, heart disease, irregular heart beat -certain medicines for depression, anxiety, or psychotic disturbances -certain medicines for migraine headache like almotriptan, eletriptan, frovatriptan, naratriptan, rizatriptan, sumatriptan, zolmitriptan -certain medicines that treat or prevent blood clots like warfarin, enoxaparin, and dalteparin -cimetidine -fentanyl -lithium -NSAIDS, medicines for pain and inflammation, like ibuprofen or naproxen -phentermine -procarbazine -sibutramine -St. John's wort -theophylline -tramadol -tryptophan This list may not describe all possible interactions. Give your health care provider a list of all the medicines, herbs, non-prescription drugs, or dietary supplements you use. Also tell them if you smoke, drink alcohol, or use illegal drugs. Some items may interact with your medicine. What should I watch for while using this medicine? Tell your doctor if your symptoms do  not  get better or if they get worse. Visit your doctor or health care professional for regular checks on your progress. Because it may take several weeks to see the full effects of this medicine, it is important to continue your treatment as prescribed by your doctor. Patients and their families should watch out for new or worsening thoughts of suicide or depression. Also watch out for sudden changes in feelings such as feeling anxious, agitated, panicky, irritable, hostile, aggressive, impulsive, severely restless, overly excited and hyperactive, or not being able to sleep. If this happens, especially at the beginning of treatment or after a change in dose, call your health care professional. Bonita Quin may get drowsy or dizzy. Do not drive, use machinery, or do anything that needs mental alertness until you know how this medicine affects you. Do not stand or sit up quickly, especially if you are an older patient. This reduces the risk of dizzy or fainting spells. Alcohol may interfere with the effect of this medicine. Avoid alcoholic drinks. This medicine can cause an increase in blood pressure. This medicine can also cause a sudden drop in your blood pressure, which may make you feel faint and increase the chance of a fall. These effects are most common when you first start the medicine or when the dose is increased, or during use of other medicines that can cause a sudden drop in blood pressure. Check with your doctor for instructions on monitoring your blood pressure while taking this medicine. Your mouth may get dry. Chewing sugarless gum or sucking hard candy, and drinking plenty of water may help. Contact your doctor if the problem does not go away or is severe. What side effects may I notice from receiving this medicine? Side effects that you should report to your doctor or health care professional as soon as possible: -allergic reactions like skin rash, itching or hives, swelling of the face, lips, or  tongue -changes in blood pressure -confusion -dark urine -dizziness -fast talking and excited feelings or actions that are out of control -fast, irregular heartbeat -fever -general ill feeling or flu-like symptoms -hallucination, loss of contact with reality -light-colored stools -loss of balance or coordination -redness, blistering, peeling or loosening of the skin, including inside the mouth -right upper belly pain -seizures -suicidal thoughts or other mood changes -trouble concentrating -trouble passing urine or change in the amount of urine -unusual bleeding or bruising -unusually weak or tired -yellowing of the eyes or skin Side effects that usually do not require medical attention (report to your doctor or health care professional if they continue or are bothersome): -blurred vision -change in appetite -change in sex drive or performance -headache -increased sweating -nausea This list may not describe all possible side effects. Call your doctor for medical advice about side effects. You may report side effects to FDA at 1-800-FDA-1088. Where should I keep my medicine? Keep out of the reach of children. Store at room temperature between 15 and 30 degrees C (59 and 86 degrees F). Throw away any unused medicine after the expiration date. NOTE: This sheet is a summary. It may not cover all possible information. If you have questions about this medicine, talk to your doctor, pharmacist, or health care provider.  2015, Elsevier/Gold Standard. (2013-03-09 14:20:31)

## 2014-12-26 NOTE — Progress Notes (Signed)
  MRN: [id number] DOB: 05/26/1960  Subjective:   Robert Wise is a 54 y.o. male presenting for follow up on depression and anxiety. At his last visit on 12/01/2014, patient was again hesitant to come off of Paxil. He had not started his Cymbalta and we discussed by phone the need for him to try a dose lower than 60mg. He has been taking 50mg of Paxil since 12/14/2014. He did not start Cymbalta. Today, he reports that there is no significant change in his mood and he thought he would still be doing better with Paxil 50 mg but admits that he is not. He still feels like he is not his normal self, still feels agitated, anxious and depressed. He is worried about coming off of his pain medication as well which he has primarily used for sleep although he admits intermittent ankle pain. He denies suicidal and homicidal ideation. 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 Patient admits that he has come across other opportunities in and is now considering finding a different employer. Denies any other aggravating or relieving factors, no other questions or concerns.  Robert Wise has a current medication list which includes the following prescription(s): atorvastatin, one touch ultra system kit, fish oil-omega-3 fatty acids, glipizide, glucose blood, hydrocodone-acetaminophen, onetouch ultrasoft, levothyroxine, levothyroxine, metformin, omeprazole, paroxetine, paroxetine, saxagliptin-metformin, duloxetine, and ondansetron. Also is allergic to tricor and zocor.  Robert Wise  has a past medical history of Diabetes mellitus without complication; Hyperlipidemia; OSA (obstructive sleep apnea) (12/15/2012); Depression; and Anxiety. Also  has past surgical history that includes Ankle surgery (Left, 2006) and Vasectomy.  Objective:   Vitals: BP 158/70  mmHg  Pulse 71  Temp(Src) 98 F (36.7 C) (Oral)  Resp 18  Ht 5' 10" (1.778 m)  Wt 229 lb (103.874 kg)  BMI 32.86 kg/m2  SpO2 98%  Physical Exam  Constitutional: He is oriented to person, place, and time. He appears well-developed and well-nourished.  Cardiovascular: Normal rate.   Pulmonary/Chest: Effort normal.  Neurological: He is alert and oriented to person, place, and time.  Psychiatric: His mood appears anxious. His affect is not angry, not blunt and not labile. His speech is not rapid and/or pressured, not delayed, not tangential and not slurred (but slowed speech). He is slowed. He is not agitated and not aggressive. Thought content is not delusional. He exhibits a depressed mood (flat affect). He expresses no homicidal and no suicidal ideation. He is communicative.   Assessment and Plan :   1. Depression 2. Anxiety - Discussed at length weaning patient off of Paxil and starting Cymbalta. At this point patient has failed both Paxil and Effexor. Patient finally acknowledges this and is willing to start Cymbalta. I gave him specific instructions for this. He is to follow up with me in one month or sooner if he is doing significantly worse. Patient verbalized understanding. - Importantly, I convinced patient that his current job may be the most significant stressor for him during his anxiety and depression. He himself has already been opened to the idea of finding a new job which she will consider more seriously now. Patient will keep me posted on this.  3. Diabetes type 2, uncontrolled - Reports that his blood sugar is looking better. No changes today.  4. Chronic pain - Continue weaning off of   pain medication. Patient is in agreement.  Jaynee Eagles, PA-C [facility] [phone] 12/26/2014 4:46 PM

## 2015-01-08 ENCOUNTER — Other Ambulatory Visit: Payer: Self-pay | Admitting: Family Medicine

## 2015-01-09 NOTE — Telephone Encounter (Signed)
Refill provided for Xanax for breakthrough anxiety/sleep.

## 2015-01-09 NOTE — Telephone Encounter (Signed)
Faxed

## 2015-01-16 ENCOUNTER — Telehealth: Payer: Self-pay

## 2015-01-16 ENCOUNTER — Other Ambulatory Visit: Payer: Self-pay | Admitting: Urgent Care

## 2015-01-16 DIAGNOSIS — G8929 Other chronic pain: Secondary | ICD-10-CM

## 2015-01-16 DIAGNOSIS — F329 Major depressive disorder, single episode, unspecified: Secondary | ICD-10-CM

## 2015-01-16 DIAGNOSIS — F32A Depression, unspecified: Secondary | ICD-10-CM

## 2015-01-16 NOTE — Telephone Encounter (Signed)
Patients wife called in requesting a med refill on HYDROcodone-acetaminophen (NORCO/VICODIN) 5-325 MG per tablet. Her call back number is [phone]

## 2015-01-18 MED ORDER — DULOXETINE HCL 60 MG PO CPEP: 60.0000 mg | ORAL_CAPSULE | Freq: Every day | ORAL | Status: DC

## 2015-01-18 MED ORDER — HYDROCODONE-ACETAMINOPHEN 5-325 MG PO TABS: 1.0000 | ORAL_TABLET | Freq: Two times a day (BID) | ORAL | Status: DC | PRN

## 2015-01-18 NOTE — Telephone Encounter (Signed)
Patient returned call. Notified and voiced understanding. He stated he is doing "not to bad", switching from Paxil to Cymbalta feels like that's going to help. He has been working on cutting back, the best he can, on hydrocodone. He stated when he doesn't take it he feels nauseated. He also needed a refill on Cymbalta. He starts 5th week tomorrow so will need to have the 60 mg sent in. I spoke with Kathlene NovemberMike and received verbal to refill Cymbalta 60 mg 1 po qd 1 refill. [facility]

## 2015-01-18 NOTE — Telephone Encounter (Signed)
Called and left message for patient. I have discussed with him at length management of his medications which includes weaning him off of Norco. Please let me know how patient is doing and I do not mind refilling his pain medicine (which he has primarily used for sleep) but it will be for much less than it was before. His last scrip was for 60 pills, I plan on doing 30 this time around. Script will be in nurse's box. I plan on using an NSAID like meloxicam in the future and/or getting him to ortho for a re-evaluation. Please let patient know.

## 2015-01-18 NOTE — Telephone Encounter (Signed)
Rx in drawer. Pt aware below.

## 2015-01-22 ENCOUNTER — Other Ambulatory Visit: Payer: Self-pay | Admitting: Urgent Care

## 2015-01-24 NOTE — Telephone Encounter (Signed)
Urban GibsonMani is it ok to refill patients Flexeril?

## 2015-01-24 NOTE — Telephone Encounter (Signed)
Refill okay?  

## 2015-02-10 ENCOUNTER — Ambulatory Visit (INDEPENDENT_AMBULATORY_CARE_PROVIDER_SITE_OTHER): Payer: BLUE CROSS/BLUE SHIELD | Admitting: Urgent Care

## 2015-02-10 VITALS — BP 138/80 | HR 75 | Temp 97.9°F | Resp 16 | Ht 71.0 in | Wt 228.6 lb

## 2015-02-10 DIAGNOSIS — F419 Anxiety disorder, unspecified: Secondary | ICD-10-CM | POA: Diagnosis not present

## 2015-02-10 DIAGNOSIS — F32A Depression, unspecified: Secondary | ICD-10-CM

## 2015-02-10 DIAGNOSIS — E118 Type 2 diabetes mellitus with unspecified complications: Secondary | ICD-10-CM

## 2015-02-10 DIAGNOSIS — F329 Major depressive disorder, single episode, unspecified: Secondary | ICD-10-CM | POA: Diagnosis not present

## 2015-02-10 DIAGNOSIS — E1165 Type 2 diabetes mellitus with hyperglycemia: Secondary | ICD-10-CM

## 2015-02-10 DIAGNOSIS — IMO0002 Reserved for concepts with insufficient information to code with codable children: Secondary | ICD-10-CM

## 2015-02-10 LAB — COMPLETE METABOLIC PANEL WITH GFR
ALT: 16 U/L (ref 9–46)
AST: 6 U/L — ABNORMAL LOW (ref 10–35)
Albumin: 4.5 g/dL (ref 3.6–5.1)
Alkaline Phosphatase: 83 U/L (ref 40–115)
BUN: 10 mg/dL (ref 7–25)
CO2: 26 mmol/L (ref 20–31)
Calcium: 9.3 mg/dL (ref 8.6–10.3)
Chloride: 98 mmol/L (ref 98–110)
Creat: 0.74 mg/dL (ref 0.70–1.33)
GFR, Est African American: >89 mL/min (ref >=60)
GFR, Est Non African American: >89 mL/min (ref >=60)
Glucose, Bld: 292 mg/dL — ABNORMAL HIGH (ref 65–99)
Potassium: 4.6 mmol/L (ref 3.5–5.3)
Sodium: 135 mmol/L (ref 135–146)
Total Bilirubin: 0.6 mg/dL (ref 0.2–1.2)
Total Protein: 7.1 g/dL (ref 6.1–8.1)

## 2015-02-10 LAB — HEMOGLOBIN A1C: Hgb A1c MFr Bld: 10.3 % — AB (ref 4.0–6.0)

## 2015-02-10 LAB — POCT GLYCOSYLATED HEMOGLOBIN (HGB A1C): Hemoglobin A1C: 10.3

## 2015-02-10 MED ORDER — DULOXETINE HCL 60 MG PO CPEP: 60.0000 mg | ORAL_CAPSULE | Freq: Every day | ORAL | Status: DC

## 2015-02-10 MED ORDER — CYCLOBENZAPRINE HCL 10 MG PO TABS: 5.0000 mg | ORAL_TABLET | Freq: Every day | ORAL | Status: DC

## 2015-02-10 MED ORDER — LISINOPRIL 5 MG PO TABS: 5.0000 mg | ORAL_TABLET | Freq: Every day | ORAL | Status: DC

## 2015-02-10 MED ORDER — DAPAGLIFLOZIN PRO-METFORMIN ER 5-1000 MG PO TB24: 2.0000 | ORAL_TABLET | Freq: Every day | ORAL | Status: DC

## 2015-02-10 NOTE — Patient Instructions (Signed)
Diabetes Mellitus and Food It is important for you to manage your blood sugar (glucose) level. Your blood glucose level can be greatly affected by what you eat. Eating healthier foods in the appropriate amounts throughout the day at about the same time each day will help you control your blood glucose level. It can also help slow or prevent worsening of your diabetes mellitus. Healthy eating may even help you improve the level of your blood pressure and reach or maintain a healthy weight.  General recommendations for healthful eating and cooking habits include:  Eating meals and snacks regularly. Avoid going long periods of time without eating to lose weight.  Eating a diet that consists mainly of plant-based foods, such as fruits, vegetables, nuts, legumes, and whole grains.  Using low-heat cooking methods, such as baking, instead of high-heat cooking methods, such as deep frying. Work with your dietitian to make sure you understand how to use the Nutrition Facts information on food labels. HOW CAN FOOD AFFECT ME? Carbohydrates Carbohydrates affect your blood glucose level more than any other type of food. Your dietitian will help you determine how many carbohydrates to eat at each meal and teach you how to count carbohydrates. Counting carbohydrates is important to keep your blood glucose at a healthy level, especially if you are using insulin or taking certain medicines for diabetes mellitus. Alcohol Alcohol can cause sudden decreases in blood glucose (hypoglycemia), especially if you use insulin or take certain medicines for diabetes mellitus. Hypoglycemia can be a life-threatening condition. Symptoms of hypoglycemia (sleepiness, dizziness, and disorientation) are similar to symptoms of having too much alcohol.  If your health care provider has given you approval to drink alcohol, do so in moderation and use the following guidelines:  Women should not have more than one drink per day, and men  should not have more than two drinks per day. One drink is equal to:  12 oz of beer.  5 oz of wine.  1 oz of hard liquor.  Do not drink on an empty stomach.  Keep yourself hydrated. Have water, diet soda, or unsweetened iced tea.  Regular soda, juice, and other mixers might contain a lot of carbohydrates and should be counted. WHAT FOODS ARE NOT RECOMMENDED? As you make food choices, it is important to remember that all foods are not the same. Some foods have fewer nutrients per serving than other foods, even though they might have the same number of calories or carbohydrates. It is difficult to get your body what it needs when you eat foods with fewer nutrients. Examples of foods that you should avoid that are high in calories and carbohydrates but low in nutrients include:  Trans fats (most processed foods list trans fats on the Nutrition Facts label).  Regular soda.  Juice.  Candy.  Sweets, such as cake, pie, doughnuts, and cookies.  Fried foods. WHAT FOODS CAN I EAT? Eat nutrient-rich foods, which will nourish your body and keep you healthy. The food you should eat also will depend on several factors, including:  The calories you need.  The medicines you take.  Your weight.  Your blood glucose level.  Your blood pressure level.  Your cholesterol level. You should eat a variety of foods, including:  Protein.  Lean cuts of meat.  Proteins low in saturated fats, such as fish, egg whites, and beans. Avoid processed meats.  Fruits and vegetables.  Fruits and vegetables that may help control blood glucose levels, such as apples, mangoes, and  yams.  Dairy products.  Choose fat-free or low-fat dairy products, such as milk, yogurt, and cheese.  Grains, bread, pasta, and rice.  Choose whole grain products, such as multigrain bread, whole oats, and brown rice. These foods may help control blood pressure.  Fats.  Foods containing healthful fats, such as nuts,  avocado, olive oil, canola oil, and fish. DOES EVERYONE WITH DIABETES MELLITUS HAVE THE SAME MEAL PLAN? Because every person with diabetes mellitus is different, there is not one meal plan that works for everyone. It is very important that you meet with a dietitian who will help you create a meal plan that is just right for you.   This information is not intended to replace advice given to you by your health care provider. Make sure you discuss any questions you have with your health care provider.   Document Released: 12/13/2004 Document Revised: 04/08/2014 Document Reviewed: 02/12/2013 Elsevier Interactive Patient Education 2016 Elsevier Inc.     Dapagliflozin; Metformin extended-release tablets What is this medicine? DAPAGLIFLOZIN; METFORMIN (DAP a gli FLOE zin; met FOR min) is a combination of 2 medicines used to treat type 2 diabetes. This medicine lowers blood sugar. Treatment is combined with a balanced diet and exercise. This medicine may be used for other purposes; ask your health care provider or pharmacist if you have questions. What should I tell my health care provider before I take this medicine? They need to know if you have any of these conditions: -anemia -dehydration -diabetic ketoacidosis -diet low in salt -eating less due to illness, surgery, dieting, or any other reason -having surgery -heart disease -high cholesterol -history of pancreatitis or pancreas problems -history of yeast infection of the penis or vagina -if you often drink alcohol -infections in the bladder, kidneys, or urinary tract -kidney disease -liver disease -low blood pressure -polycystic ovary syndrome -problems urinating -serious infection or injury -type 1 diabetes -uncircumcised male -vomiting -an unusual or allergic reaction to dapagliflozin, metformin, other medicines, foods, dyes, or preservatives -pregnant or trying to get pregnant -breast-feeding How should I use this  medicine? Take this medicine by mouth with a glass of water. Take this medicine in the morning with food. Follow the directions on the prescription label. Do not cut, crush, or chew this medicine. Take your doses at regular intervals. Do not take your medicine more often than directed. Do not stop taking except on your doctor's advice. A special MedGuide will be given to you by the pharmacist with each prescription and refill. Be sure to read this information carefully each time. Talk to your pediatrician regarding the use of this medicine in children. Special care may be needed. Overdosage: If you think you have taken too much of this medicine contact a poison control center or emergency room at once. NOTE: This medicine is only for you. Do not share this medicine with others. What if I miss a dose? If you miss a dose, take it as soon as you can. If it is almost time for your next dose, take only that dose. Do not take double or extra doses. What may interact with this medicine? Do not take this medicine with any of the following medications: -dofetilide -gatifloxacin -certain contrast medicines given before X-rays, CT scans, MRI, or other procedures This medicine may also interact with the following medications: -acetazolamide -alcohol -certain antiviral medicines for HIV infection or hepatitis -cimetidine -crizotinib -digoxin -diuretics -male hormones, like estrogens or progestins and birth control pills -glycopyrrolate -isoniazid -lamotrigine -medicines for blood   pressure, heart disease, irregular heart beat -memantine -methazolamide -midodrine -morphine -nicotinic acid -phenobarbital -phenothiazines like chlorpromazine, mesoridazine, prochlorperazine, thioridazine -phenytoin -procainamide -propantheline -quinidine -quinine -ranitidine -ranolazine -rifampin -ritonavir -steroid medicines like prednisone or cortisone -stimulant medicines for attention disorders, weight  loss, or to stay awake -thyroid medicines -topiramate -trimethoprim -trospium -vancomycin -vandetanib -zonisamide This list may not describe all possible interactions. Give your health care provider a list of all the medicines, herbs, non-prescription drugs, or dietary supplements you use. Also tell them if you smoke, drink alcohol, or use illegal drugs. Some items may interact with your medicine. What should I watch for while using this medicine? Visit your doctor or health care professional for regular checks on your progress. This medicine can cause a serious condition in which there is too much acid in the blood. If you develop nausea, vomiting, stomach pain, unusual tiredness, or breathing problems, stop taking this medicine and call your doctor right away. If possible, use a ketone dipstick to check for ketones in your urine. A test called the HbA1C (A1C) will be monitored. This is a simple blood test. It measures your blood sugar control over the last 2 to 3 months. You will receive this test every 3 to 6 months. Learn how to check your blood sugar. Learn the symptoms of low and high blood sugar and how to manage them. Always carry a quick-source of sugar with you in case you have symptoms of low blood sugar. Examples include hard sugar candy or glucose tablets. Make sure others know that you can choke if you eat or drink when you develop serious symptoms of low blood sugar, such as seizures or unconsciousness. They must get medical help at once. Tell your doctor or health care professional if you have high blood sugar. You might need to change the dose of your medicine. If you are sick or exercising more than usual, you might need to change the dose of your medicine. Do not skip meals. Ask your doctor or health care professional if you should avoid alcohol. Many nonprescription cough and cold products contain sugar or alcohol. These can affect blood sugar. This medicine may cause ovulation  in premenopausal women who do not have regular monthly periods. This may increase your chances of becoming pregnant. You should not take this medicine if you become pregnant or think you may be pregnant. Talk with your doctor or health care professional about your birth control options while taking this medicine. Contact your doctor or health care professional right away if think you are pregnant. If you are going to need surgery, a MRI, CT scan, or other procedure, tell your doctor that you are taking this medicine. You may need to stop taking this medicine before the procedure. Wear a medical ID bracelet or chain, and carry a card that describes your disease and details of your medicine and dosage times. You may see empty tablets in your stool. This is normal. What side effects may I notice from receiving this medicine? Side effects that you should report to your doctor or health care professional as soon as possible: -allergic reactions like skin rash, itching or hives, swelling of the face, lips, or tongue -breathing problems -dizziness -feeling faint or lightheaded, falls -muscle aches or pains -muscle weakness -nausea, vomiting, unusual stomach upset or pain -signs and symptoms of low blood sugar such as feeling anxious, confusion, dizziness, increased hunger, unusually weak or tired, sweating, shakiness, cold, irritable, headache, blurred vision, fast heartbeat, loss of consciousness -signs and   symptoms of a urinary tract infection, such as fever, chills, a burning feeling when urinating, blood in the urine, back pain -trouble passing urine or change in the amount of urine, including an urgent need to urinate more often, in larger amounts, or at night -penile discharge, itching, or pain in men -slow, irregular heartbeat -unusually tired or weak -vaginal discharge, itching, or odor in women Side effects that usually do not require medical attention (Report these to your doctor or health  care professional if they continue or are bothersome.): -constipation -diarrhea -headache -heartburn -mild increase in urination -stomach gas -stuffy or runny nose -sore throat -thirsty This list may not describe all possible side effects. Call your doctor for medical advice about side effects. You may report side effects to FDA at 1-800-FDA-1088. Where should I keep my medicine? Keep out of the reach of children. Store at room temperature between 15 and 30 degrees C (59 and 86 degrees F). Throw away any unused medicine after the expiration date. NOTE: This sheet is a summary. It may not cover all possible information. If you have questions about this medicine, talk to your doctor, pharmacist, or health care provider.    2016, Elsevier/Gold Standard. (2014-03-08 11:22:26)  

## 2015-02-10 NOTE — Progress Notes (Signed)
MRN: [id number] DOB: 01/07/1961  Subjective:   Robert Wise is a 54 y.o. male presenting for chief complaint of Follow-up  Anxiety and Depression - patient has steadily been increasing his dose of Cymbalta, currently at 81m. He has now come off Paxil. Reports that his mood has been progressively improving. He acknowledges that coming off of paxil and hydrocodone so close together was challenging but admits that he thinks this will be good for him. He would like to continue Cymbalta 629m He does admit occasional sleepiness but overall thinks he is doing better with regard to pain/neuropathic type pain in his right ankle. He denies panic attacks, homicidal or suicidal ideation. He has had the urge to drink again but has not actually regressed.  Diabetes - currently only managed with metformin and glipizide. His blood sugar is running between 230's - 300's. 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 He denies dry mouth, polyuria, weight loss, blurred vision, chest pain, shob, n/v, abdominal pain.  Denies any other aggravating or relieving factors, no other questions or concerns.  Robert Wise a current medication list which includes the following prescription(s): alprazolam, atorvastatin, one touch ultra system kit, cyclobenzaprine, duloxetine, fish oil-omega-3 fatty acids, glipizide, glucose blood, onetouch ultrasoft, levothyroxine, levothyroxine, metformin, omeprazole, ondansetron, saxagliptin-metformin, and hydrocodone-acetaminophen. Also is allergic to tricor and zocor.  Robert Wise a past medical history of Diabetes mellitus without complication ([facility] Hyperlipidemia; OSA (obstructive sleep apnea) (12/15/2012); Depression; and Anxiety. Also  has past surgical history that includes Ankle surgery (Left, 2006) and Vasectomy.  Objective:   Vitals: BP 138/80 mmHg  Pulse 75  Temp(Src) 97.9 F (36.6 C) (Oral)  Resp 16  Ht 5' 11" (1.803 m)  Wt 228 lb 9.6 oz (103.692 kg)  BMI  31.90 kg/m2  SpO2 96%  Physical Exam  Constitutional: He is oriented to person, place, and time. He appears well-developed and well-nourished.  HENT:  Mouth/Throat: Oropharynx is clear and moist.  Eyes: Right eye exhibits no discharge. Left eye exhibits no discharge. No scleral icterus.  Neck: Normal range of motion. Neck supple. No thyromegaly present.  Cardiovascular: Normal rate, regular rhythm and intact distal pulses.  Exam reveals no gallop and no friction rub.   No murmur heard. Pulmonary/Chest: No respiratory distress. He has no wheezes. He has no rales.  Abdominal: Soft. Bowel sounds are normal. He exhibits no distension and no mass. There is no tenderness.  Neurological: He is alert and oriented to person, place, and time.  Skin: Skin is warm and dry. No rash noted. No erythema. No pallor.  Psychiatric:  Flat but at times cheerful. Still has somewhat slowed speech.   Results for orders placed or performed in visit on 02/10/15 (from the past 24 hour(s))  POCT glycosylated hemoglobin (Hb A1C)     Status: None   Collection Time: 02/10/15 11:05 AM  Result Value Ref Range   Hemoglobin A1C 10.3    Assessment and Plan :   1. Depression 2. Anxiety - Improved, no more refills for Paxil or Hydrocodone. Continue Cymbalta 6023mnce daily. Consider increasing dose to 81m81m his anxiety and depression persists. Recheck in 4-6 weeks.  3. Uncontrolled type 2 diabetes mellitus with complication, without long-term current use of insulin (HCC)HuttoStart Xigduo, 2 tablets once daily. Recommended significant dietary modifications, continue hydration. Recheck in 3 months.  Robert Wise [facility] [phone]/2016 10:06 AM

## 2015-02-11 ENCOUNTER — Encounter: Payer: Self-pay | Admitting: Urgent Care

## 2015-02-13 ENCOUNTER — Telehealth: Payer: Self-pay

## 2015-02-13 NOTE — Telephone Encounter (Signed)
PA started for Xigduo 5 mg-1000 mg tablet.

## 2015-02-16 NOTE — Telephone Encounter (Addendum)
Got fax from [employer] stating that they don't manage pharmacy PAs. Resubmitted covermymeds for [employer]. Pending. Thank you Britta MccreedyBarbara!

## 2015-02-20 NOTE — Telephone Encounter (Signed)
Yes - you know I had given him Xigduo because the pharm reps promised this would be easy to do. But this is too much work. Lets go with Invokamet 50/1000 BID with food.

## 2015-02-20 NOTE — Telephone Encounter (Signed)
PA denied because pt has not tried/failed preferred alternative, Invokamet. Kathlene NovemberMike do you want to Rx this?

## 2015-02-21 NOTE — Telephone Encounter (Signed)
I had forgotten that the rep said she could straighten out w/pharm if there was a problem with PA approval. I called and asked Misty StanleyLisa to speak to pharmacist (given only HIPPA approved info).

## 2015-02-21 NOTE — Telephone Encounter (Signed)
Misty StanleyLisa, rep for Davonna BellingXigduo, came by to report that she has gotten the pharm straightened out and it is now covered. Derryl HarborMike, FYI, and thank you for reminding me to check w/drug rep.

## 2015-02-27 ENCOUNTER — Encounter: Payer: Self-pay | Admitting: Family Medicine

## 2015-03-10 ENCOUNTER — Ambulatory Visit (INDEPENDENT_AMBULATORY_CARE_PROVIDER_SITE_OTHER): Payer: Self-pay | Admitting: Physician Assistant

## 2015-03-10 VITALS — BP 134/86 | HR 74 | Temp 98.3°F | Resp 17 | Ht 70.5 in | Wt 224.0 lb

## 2015-03-10 DIAGNOSIS — Z024 Encounter for examination for driving license: Secondary | ICD-10-CM

## 2015-03-10 DIAGNOSIS — Z021 Encounter for pre-employment examination: Secondary | ICD-10-CM

## 2015-03-10 DIAGNOSIS — E119 Type 2 diabetes mellitus without complications: Secondary | ICD-10-CM

## 2015-03-10 LAB — GLUCOSE, POCT (MANUAL RESULT ENTRY): POC Glucose: 154 mg/dl — AB (ref 70–99)

## 2015-03-10 NOTE — Progress Notes (Signed)
   Subjective:    Patient ID: Robert Wise, male    DOB: 02/22/1961, 54 y.o.   MRN: [id number]  Chief Complaint  Patient presents with  . Employment Physical    DOT    Medications, allergies, past medical history, surgical history, family history, social history and problem list reviewed and updated.  HPI  [age] yom presents for DOT exam. Typically gets one year cards due to hx DMII. He was seen by our clinic last month for his DM. A1C 11% at that time. He was started on xigduo at that time. Has been taking daily. He does not exercise daily. He is a smoker. He denies any hypoglycemic symptoms.   Review of Systems No cp, sob, fevers, chills, abd pain.     Objective:   Physical Exam  Constitutional: He is oriented to person, place, and time. He appears well-developed and well-nourished.  Non-toxic appearance. He does not have a sickly appearance. He does not appear ill. No distress.  BP 134/86 mmHg  Pulse 74  Temp(Src) 98.3 F (36.8 C) (Oral)  Resp 17  Ht 5' 10.5" (1.791 m)  Wt 224 lb (101.606 kg)  BMI 31.68 kg/m2  SpO2 98%   HENT:  Right Ear: Tympanic membrane normal.  Left Ear: Tympanic membrane normal.  Nose: No mucosal edema or rhinorrhea.  Mouth/Throat: Uvula is midline, oropharynx is clear and moist and mucous membranes are normal.  Eyes: Conjunctivae and EOM are normal. Pupils are equal, round, and reactive to light.  Neck: Trachea normal and normal range of motion. No JVD present. Carotid bruit is not present.  Cardiovascular: Normal rate, regular rhythm and normal heart sounds.   Pulmonary/Chest: Effort normal and breath sounds normal.  Abdominal: Soft. Normal appearance and bowel sounds are normal. There is no tenderness.  Neurological: He is alert and oriented to person, place, and time. No cranial nerve deficit or sensory deficit.  6/6 monofilament testing bilateral feet  Psychiatric: He has a normal mood and affect. His speech is normal and behavior is normal.    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 Aleksander Edmiston, PA-C Physician Assistant-Certified [facility]  03/10/2015 10:57 AM

## 2015-04-20 ENCOUNTER — Ambulatory Visit (INDEPENDENT_AMBULATORY_CARE_PROVIDER_SITE_OTHER): Payer: Managed Care, Other (non HMO) | Admitting: Urgent Care

## 2015-04-20 ENCOUNTER — Ambulatory Visit (INDEPENDENT_AMBULATORY_CARE_PROVIDER_SITE_OTHER): Payer: Managed Care, Other (non HMO)

## 2015-04-20 VITALS — BP 152/70 | HR 84 | Temp 97.7°F | Resp 20 | Ht 70.5 in | Wt 229.4 lb

## 2015-04-20 DIAGNOSIS — M79644 Pain in right finger(s): Secondary | ICD-10-CM

## 2015-04-20 DIAGNOSIS — F329 Major depressive disorder, single episode, unspecified: Secondary | ICD-10-CM

## 2015-04-20 DIAGNOSIS — F17200 Nicotine dependence, unspecified, uncomplicated: Secondary | ICD-10-CM

## 2015-04-20 DIAGNOSIS — E782 Mixed hyperlipidemia: Secondary | ICD-10-CM

## 2015-04-20 DIAGNOSIS — S6991XA Unspecified injury of right wrist, hand and finger(s), initial encounter: Secondary | ICD-10-CM

## 2015-04-20 DIAGNOSIS — R110 Nausea: Secondary | ICD-10-CM

## 2015-04-20 DIAGNOSIS — E1165 Type 2 diabetes mellitus with hyperglycemia: Secondary | ICD-10-CM

## 2015-04-20 DIAGNOSIS — Z136 Encounter for screening for cardiovascular disorders: Secondary | ICD-10-CM | POA: Diagnosis not present

## 2015-04-20 DIAGNOSIS — R454 Irritability and anger: Secondary | ICD-10-CM | POA: Diagnosis not present

## 2015-04-20 DIAGNOSIS — F32A Depression, unspecified: Secondary | ICD-10-CM

## 2015-04-20 DIAGNOSIS — IMO0001 Reserved for inherently not codable concepts without codable children: Secondary | ICD-10-CM

## 2015-04-20 LAB — POCT GLYCOSYLATED HEMOGLOBIN (HGB A1C): Hemoglobin A1C: 9

## 2015-04-20 MED ORDER — NAPROXEN SODIUM 550 MG PO TABS: 550.0000 mg | ORAL_TABLET | Freq: Two times a day (BID) | ORAL | Status: DC

## 2015-04-20 MED ORDER — GLIPIZIDE 5 MG PO TABS: 5.0000 mg | ORAL_TABLET | Freq: Every day | ORAL | Status: DC

## 2015-04-20 MED ORDER — ONDANSETRON 8 MG PO TBDP: 8.0000 mg | ORAL_TABLET | Freq: Three times a day (TID) | ORAL | Status: DC | PRN

## 2015-04-20 NOTE — Patient Instructions (Addendum)
- Please get in touch with your insurance company to check for coverage of different psychiatrists for management of refractory depression. - Within the next couple of weeks, come to our clinic for labs only to check your testosterone and cholesterol levels. - Read through the information for quitting smoking which should be a top priority to avoid heart attacks, heart disease.   Smoking Cessation, Tips for Success If you are ready to quit smoking, congratulations! You have chosen to help yourself be healthier. Cigarettes bring nicotine, tar, carbon monoxide, and other irritants into your body. Your lungs, heart, and blood vessels will be able to work better without these poisons. There are many different ways to quit smoking. Nicotine gum, nicotine patches, a nicotine inhaler, or nicotine nasal spray can help with physical craving. Hypnosis, support groups, and medicines help break the habit of smoking. WHAT THINGS CAN I DO TO MAKE QUITTING EASIER?  Here are some tips to help you quit for good:  Pick a date when you will quit smoking completely. Tell all of your friends and family about your plan to quit on that date.  Do not try to slowly cut down on the number of cigarettes you are smoking. Pick a quit date and quit smoking completely starting on that day.  Throw away all cigarettes.   Clean and remove all ashtrays from your home, work, and car.  On a card, write down your reasons for quitting. Carry the card with you and read it when you get the urge to smoke.  Cleanse your body of nicotine. Drink enough water and fluids to keep your urine clear or pale yellow. Do this after quitting to flush the nicotine from your body.  Learn to predict your moods. Do not let a bad situation be your excuse to have a cigarette. Some situations in your life might tempt you into wanting a cigarette.  Never have "just one" cigarette. It leads to wanting another and another. Remind yourself of your decision  to quit.  Change habits associated with smoking. If you smoked while driving or when feeling stressed, try other activities to replace smoking. Stand up when drinking your coffee. Brush your teeth after eating. Sit in a different chair when you read the paper. Avoid alcohol while trying to quit, and try to drink fewer caffeinated beverages. Alcohol and caffeine may urge you to smoke.  Avoid foods and drinks that can trigger a desire to smoke, such as sugary or spicy foods and alcohol.  Ask people who smoke not to smoke around you.  Have something planned to do right after eating or having a cup of coffee. For example, plan to take a walk or exercise.  Try a relaxation exercise to calm you down and decrease your stress. Remember, you may be tense and nervous for the first 2 weeks after you quit, but this will pass.  Find new activities to keep your hands busy. Play with a pen, coin, or rubber band. Doodle or draw things on paper.  Brush your teeth right after eating. This will help cut down on the craving for the taste of tobacco after meals. You can also try mouthwash.   Use oral substitutes in place of cigarettes. Try using lemon drops, carrots, cinnamon sticks, or chewing gum. Keep them handy so they are available when you have the urge to smoke.  When you have the urge to smoke, try deep breathing.  Designate your home as a nonsmoking area.  If you are  a heavy smoker, ask your health care provider about a prescription for nicotine chewing gum. It can ease your withdrawal from nicotine.  Reward yourself. Set aside the cigarette money you save and buy yourself something nice.  Look for support from others. Join a support group or smoking cessation program. Ask someone at home or at work to help you with your plan to quit smoking.  Always ask yourself, "Do I need this cigarette or is this just a reflex?" Tell yourself, "Today, I choose not to smoke," or "I do not want to smoke." You are  reminding yourself of your decision to quit.  Do not replace cigarette smoking with electronic cigarettes (commonly called e-cigarettes). The safety of e-cigarettes is unknown, and some may contain harmful chemicals.  If you relapse, do not give up! Plan ahead and think about what you will do the next time you get the urge to smoke. HOW WILL I FEEL WHEN I QUIT SMOKING? You may have symptoms of withdrawal because your body is used to nicotine (the addictive substance in cigarettes). You may crave cigarettes, be irritable, feel very hungry, cough often, get headaches, or have difficulty concentrating. The withdrawal symptoms are only temporary. They are strongest when you first quit but will go away within 10-14 days. When withdrawal symptoms occur, stay in control. Think about your reasons for quitting. Remind yourself that these are signs that your body is healing and getting used to being without cigarettes. Remember that withdrawal symptoms are easier to treat than the major diseases that smoking can cause.  Even after the withdrawal is over, expect periodic urges to smoke. However, these cravings are generally short lived and will go away whether you smoke or not. Do not smoke! WHAT RESOURCES ARE AVAILABLE TO HELP ME QUIT SMOKING? Your health care provider can direct you to community resources or hospitals for support, which may include:  Group support.  Education.  Hypnosis.  Therapy.   This information is not intended to replace advice given to you by your health care provider. Make sure you discuss any questions you have with your health care provider.   Document Released: 12/15/2003 Document Revised: 04/08/2014 Document Reviewed: 09/03/2012 Elsevier Interactive Patient Education 2016 [employer].    Bupropion sustained-release tablets (smoking cessation) What is this medicine? BUPROPION (byoo PROE pee on) is used to help people quit smoking. This medicine may be used for other  purposes; ask your health care provider or pharmacist if you have questions. What should I tell my health care provider before I take this medicine? They need to know if you have any of these conditions: -an eating disorder, such as anorexia or bulimia -bipolar disorder or psychosis -diabetes or high blood sugar, treated with medication -glaucoma -head injury or brain tumor -heart disease, previous heart attack, or irregular heart beat -high blood pressure -kidney or liver disease -seizures -suicidal thoughts or a previous suicide attempt -Tourette's syndrome -weight loss -an unusual or allergic reaction to bupropion, other medicines, foods, dyes, or preservatives -breast-feeding -pregnant or trying to become pregnant How should I use this medicine? Take this medicine by mouth with a glass of water. Follow the directions on the prescription label. You can take it with or without food. If it upsets your stomach, take it with food. Do not cut, crush or chew this medicine. Take your medicine at regular intervals. If you take this medicine more than once a day, take your second dose at least 8 hours after you take your  first dose. To limit difficulty in sleeping, avoid taking this medicine at bedtime. Do not take your medicine more often than directed. Do not stop taking this medicine suddenly except upon the advice of your doctor. Stopping this medicine too quickly may cause serious side effects. A special MedGuide will be given to you by the pharmacist with each prescription and refill. Be sure to read this information carefully each time. Talk to your pediatrician regarding the use of this medicine in children. Special care may be needed. Overdosage: If you think you have taken too much of this medicine contact a poison control center or emergency room at once. NOTE: This medicine is only for you. Do not share this medicine with others. What if I miss a dose? If you miss a dose, skip the  missed dose and take your next tablet at the regular time. There should be at least 8 hours between doses. Do not take double or extra doses. What may interact with this medicine? Do not take this medicine with any of the following medications: -linezolid -MAOIs like Azilect, Carbex, Eldepryl, Marplan, Nardil, and Parnate -methylene blue (injected into a vein) -other medicines that contain bupropion like Wellbutrin This medicine may also interact with the following medications: -alcohol -certain medicines for anxiety or sleep -certain medicines for blood pressure like metoprolol, propranolol -certain medicines for depression or psychotic disturbances -certain medicines for HIV or AIDS like efavirenz, lopinavir, nelfinavir, ritonavir -certain medicines for irregular heart beat like propafenone, flecainide -certain medicines for Parkinson's disease like amantadine, levodopa -certain medicines for seizures like carbamazepine, phenytoin, phenobarbital -cimetidine -clopidogrel -cyclophosphamide -furazolidone -isoniazid -nicotine -orphenadrine -procarbazine -steroid medicines like prednisone or cortisone -stimulant medicines for attention disorders, weight loss, or to stay awake -tamoxifen -theophylline -thiotepa -ticlopidine -tramadol -warfarin This list may not describe all possible interactions. Give your health care provider a list of all the medicines, herbs, non-prescription drugs, or dietary supplements you use. Also tell them if you smoke, drink alcohol, or use illegal drugs. Some items may interact with your medicine. What should I watch for while using this medicine? Visit your doctor or health care professional for regular checks on your progress. This medicine should be used together with a patient support program. It is important to participate in a behavioral program, counseling, or other support program that is recommended by your health care professional. Patients and  their families should watch out for new or worsening thoughts of suicide or depression. Also watch out for sudden changes in feelings such as feeling anxious, agitated, panicky, irritable, hostile, aggressive, impulsive, severely restless, overly excited and hyperactive, or not being able to sleep. If this happens, especially at the beginning of treatment or after a change in dose, call your health care professional. Avoid alcoholic drinks while taking this medicine. Drinking excessive alcoholic beverages, using sleeping or anxiety medicines, or quickly stopping the use of these agents while taking this medicine may increase your risk for a seizure. Do not drive or use heavy machinery until you know how this medicine affects you. This medicine can impair your ability to perform these tasks. Do not take this medicine close to bedtime. It may prevent you from sleeping. Your mouth may get dry. Chewing sugarless gum or sucking hard candy, and drinking plenty of water may help. Contact your doctor if the problem does not go away or is severe. Do not use nicotine patches or chewing gum without the advice of your doctor or health care professional while taking this medicine. You  may need to have your blood pressure taken regularly if your doctor recommends that you use both nicotine and this medicine together. What side effects may I notice from receiving this medicine? Side effects that you should report to your doctor or health care professional as soon as possible: -allergic reactions like skin rash, itching or hives, swelling of the face, lips, or tongue -breathing problems -changes in vision -confusion -fast or irregular heartbeat -hallucinations -increased blood pressure -redness, blistering, peeling or loosening of the skin, including inside the mouth -seizures -suicidal thoughts or other mood changes -unusually weak or tired -vomiting Side effects that usually do not require medical attention  (report to your doctor or health care professional if they continue or are bothersome): -change in sex drive or performance -constipation -headache -loss of appetite -nausea -tremors -weight loss This list may not describe all possible side effects. Call your doctor for medical advice about side effects. You may report side effects to FDA at 1-800-FDA-1088. Where should I keep my medicine? Keep out of the reach of children. Store at room temperature between 20 and 25 degrees C (68 and 77 degrees F). Protect from light. Keep container tightly closed. Throw away any unused medicine after the expiration date. NOTE: This sheet is a summary. It may not cover all possible information. If you have questions about this medicine, talk to your doctor, pharmacist, or health care provider.    2016, Elsevier/Gold Standard. (2012-11-13 10:55:10)

## 2015-04-20 NOTE — Progress Notes (Signed)
MRN: [id number] DOB: [birth year]  Subjective:   Robert Wise is a 55 y.o. male presenting for Follow-up  DM - currently taking only Xigduo. Checking blood sugar at home, runs 150-200. He is also taking lisinopril 72m for renal protection. Patient is still smoking ~1.5ppd. Denies chronic blurred vision, polydipsia, chest pain, abdominal pain, foot infections.  HL - currently taking atorvastatin. He has made diet changes but also admits that he still has work to do. Does not exercise, works long hours and believes he stays active with his work.  Depression - he is currently taking Cymbalta 630m Patient felt improvement for a period of several weeks. But he feels like it plateaued. He admits that he has some good days and some bad days. On his bad days he feels down, irritable. However, overall he does feel improved. Denies homicidal or suicidal ideation. His work remains a major stressor but he has not started looking for another job. He would like to consider more medical therapy and is opposed to seeing a psychiatrist. He has previously tried Effexor, Paxil. Paxil worked for years until the past 1-2 years.   Hand injury - reports ~1 month history of right hand injury while at work. Patient states that he made impact with his right middle finger against a metal object. He has since had finger pain, swelling and intermittent redness. States that he notified his supervisor about his injury at work and nothing came of it. He has continued his normal routine without any medications or rest.  DaDanzigas a current medication list which includes the following prescription(s): alprazolam, atorvastatin, one touch ultra system kit, cyclobenzaprine, dapagliflozin-metformin hcl er, duloxetine, fish oil-omega-3 fatty acids, glucose blood, onetouch ultrasoft, levothyroxine, lisinopril, omeprazole, ondansetron, and glipizide. Also is allergic to tricor and zocor.  DaJewelzhas a past medical history of Diabetes  mellitus without complication ([place] Hyperlipidemia; OSA (obstructive sleep apnea) (12/15/2012); Depression; and Anxiety. Also  has past surgical history that includes Ankle surgery (Left, 2006) and Vasectomy.  Objective:   Vitals: BP 152/70 mmHg  Pulse 84  Temp(Src) 97.7 F (36.5 C) (Oral)  Resp 20  Ht 5' 10.5" (1.791 m)  Wt 229 lb 6.4 oz (104.055 kg)  BMI 32.44 kg/m2  SpO2 94%  BP Readings from Last 3 Encounters:  04/20/15 152/70  03/10/15 134/86  02/10/15 138/80   Wt Readings from Last 3 Encounters:  04/20/15 229 lb 6.4 oz (104.055 kg)  03/10/15 224 lb (101.606 kg)  02/10/15 228 lb 9.6 oz (103.692 kg)   Physical Exam  Constitutional: He is oriented to person, place, and time. He appears well-developed and well-nourished.  HENT:  Mouth/Throat: Oropharynx is clear and moist.  Eyes: Right eye exhibits no discharge. Left eye exhibits no discharge. No scleral icterus.  Neck: Normal range of motion. Neck supple. No thyromegaly present.  Cardiovascular: Normal rate, regular rhythm and intact distal pulses.  Exam reveals no gallop and no friction rub.   No murmur heard. Pulmonary/Chest: No respiratory distress. He has no wheezes. He has no rales.  Abdominal: Soft. Bowel sounds are normal. He exhibits no distension and no mass. There is no tenderness.  Musculoskeletal: He exhibits no edema (no peripheral edema).       Right hand: He exhibits decreased range of motion (full flexion at MCP), tenderness, bony tenderness (over MCP joint) and swelling. He exhibits normal capillary refill and no deformity. Normal sensation noted. Normal strength noted.       Hands: Neurological: He  is alert and oriented to person, place, and time.  Skin: Skin is warm and dry.   Results for orders placed or performed in visit on 04/20/15 (from the past 24 hour(s))  POCT glycosylated hemoglobin (Hb A1C)     Status: None   Collection Time: 04/20/15  7:14 PM  Result Value Ref Range   Hemoglobin A1C 9.0      [facility] reading (PRIMARY) by  Dr. Marin Comment and PA-Maitland Lesiak. Right middle finger - Questionable loose body over 2nd MCP lateral joint, please comment whether chronic or acute. Also, bony irregularity over proximal 3rd MCP joint space, please comment.  Assessment and Plan :   1. Uncontrolled type 2 diabetes mellitus without complication, without long-term current use of insulin ([place]) - Improved but still needs work. I am having patient restart glipizide, continue Xigduo. Encouraged continued efforts at healthy diet and exercise. - Follow up with labs.  2. Depression 3. Irritability 4. Nausea without vomiting - Patient has refractory depression. I counseled patient on need to get treatment with psychiatry. He is very reluctant to do this but will ask his insurance for providers in his network. - His nausea may be associated with Cymbalta and/or Xigduo. He has improvement with Zofran, refilled this and continue Cymbalta given his improvement. I do believe he needs dual therapy but need help from a psychiatrist to get this done.  5. Mixed hyperlipidemia 6. Screening for heart disease 7. Tobacco use disorder - Counseled patient extensively on smoking cessation. He is going to consider this. Wellbutrin may be an option although very risky given that he is already on Cymbalta. However, it may also help with his depression. Will revisit this by phone. - Ambulatory referral to Cardiology for screening of heart disease.  8. Hand injury, right, initial encounter 9. Pain of right middle finger - Referral to hand specialist for further management. - Anaprox for pain and inflammation. Patient unwilling to wear splint due to his work.  Robert Eagles, PA-C [facility] [phone] 04/20/2015 6:23 PM

## 2015-04-21 LAB — COMPREHENSIVE METABOLIC PANEL
ALT: 14 U/L (ref 9–46)
AST: 15 U/L (ref 10–35)
Albumin: 4.6 g/dL (ref 3.6–5.1)
Alkaline Phosphatase: 74 U/L (ref 40–115)
BUN: 10 mg/dL (ref 7–25)
CO2: 28 mmol/L (ref 20–31)
Calcium: 9.1 mg/dL (ref 8.6–10.3)
Chloride: 99 mmol/L (ref 98–110)
Creat: 0.88 mg/dL (ref 0.70–1.33)
Glucose, Bld: 143 mg/dL — ABNORMAL HIGH (ref 65–99)
Potassium: 3.8 mmol/L (ref 3.5–5.3)
Sodium: 135 mmol/L (ref 135–146)
Total Bilirubin: 0.5 mg/dL (ref 0.2–1.2)
Total Protein: 7.2 g/dL (ref 6.1–8.1)

## 2015-04-28 ENCOUNTER — Encounter: Payer: Self-pay | Admitting: Urgent Care

## 2015-04-28 ENCOUNTER — Telehealth: Payer: Self-pay

## 2015-04-28 ENCOUNTER — Telehealth: Payer: Self-pay | Admitting: Urgent Care

## 2015-04-28 DIAGNOSIS — F332 Major depressive disorder, recurrent severe without psychotic features: Secondary | ICD-10-CM

## 2015-04-28 MED ORDER — ARIPIPRAZOLE 5 MG PO TABS: ORAL_TABLET | ORAL | Status: DC

## 2015-04-28 NOTE — Telephone Encounter (Signed)
Patient continues to have difficulty managing his persistent depression. I will request referral to Psychiatry. Start Abilify today. Patient contracts for safety, denies suicidal or homicidal thoughts, thoughts about death in general. I recommended patient continue to communicate with [place] and call us if he starts to have a change in his mental status. I recommended that patient report to the ED if he starts to have thoughts about death. I also recommended patient stop working for the moment but patient declined this. He will be in touch with [place] if anything changes for him. He agreed to start Abilify today.

## 2015-04-28 NOTE — Telephone Encounter (Signed)
Left VM. Blood sugar is significantly improved. Cmet otherwise normal. Patient is supposed to look into his coverage for psychiatric care. I discussed this at length given his refractory depression. He has improved on  of Cymbalta but patient still needs more therapy. He has previously failed Paxil, Prozac, Effexor. He also tried therapy but was not deemed a good candidate for this modality of treatment. He has weaned himself off of opioids since 12/2014. Unfortunately, his major stressor of his job is not going to change very soon. 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 In any case, I will await his call back for continued management of his persistent depression.

## 2015-04-28 NOTE — Telephone Encounter (Signed)
Patient is returning a missed phone call from [place].

## 2015-05-03 ENCOUNTER — Telehealth: Payer: Self-pay

## 2015-05-03 DIAGNOSIS — F332 Major depressive disorder, recurrent severe without psychotic features: Secondary | ICD-10-CM

## 2015-05-03 MED ORDER — ARIPIPRAZOLE 10 MG PO TABS: ORAL_TABLET | ORAL | Status: DC

## 2015-05-03 NOTE — Telephone Encounter (Signed)
PA was needed and denied for #60 of the 5 mg generic abilify as Kathlene November wrote Rx in order for pt to titrate up. I called pharm and they reported that the ins already paid for #30 of the 5 mg and pt has already picked up. They stated that we just need to write a Rx for 10 mg tabs for pt to only take 1 tab per day after 2 weeks (instead of 2 of the 5 mg). Resent Rx in 10 mg form for same dose that Kathlene November wrote.

## 2015-05-09 ENCOUNTER — Other Ambulatory Visit (INDEPENDENT_AMBULATORY_CARE_PROVIDER_SITE_OTHER): Payer: Managed Care, Other (non HMO) | Admitting: *Deleted

## 2015-05-09 DIAGNOSIS — F32A Depression, unspecified: Secondary | ICD-10-CM

## 2015-05-09 DIAGNOSIS — R454 Irritability and anger: Secondary | ICD-10-CM

## 2015-05-09 DIAGNOSIS — F329 Major depressive disorder, single episode, unspecified: Secondary | ICD-10-CM

## 2015-05-09 DIAGNOSIS — E782 Mixed hyperlipidemia: Secondary | ICD-10-CM

## 2015-05-09 LAB — LIPID PANEL
Cholesterol: 154 mg/dL (ref 125–200)
HDL: 21 mg/dL — ABNORMAL LOW (ref >=40)
LDL Cholesterol: 54 mg/dL (ref <130)
Total CHOL/HDL Ratio: 7.3 Ratio — ABNORMAL HIGH (ref <=5.0)
Triglycerides: 394 mg/dL — ABNORMAL HIGH (ref <150)
VLDL: 79 mg/dL — ABNORMAL HIGH (ref <30)

## 2015-05-09 LAB — TESTOSTERONE: Testosterone: 251 ng/dL (ref 250–827)

## 2015-06-19 ENCOUNTER — Other Ambulatory Visit: Payer: Self-pay | Admitting: Urgent Care

## 2015-07-09 ENCOUNTER — Other Ambulatory Visit: Payer: Self-pay | Admitting: Urgent Care

## 2015-07-28 ENCOUNTER — Ambulatory Visit (INDEPENDENT_AMBULATORY_CARE_PROVIDER_SITE_OTHER): Payer: Managed Care, Other (non HMO) | Admitting: Urgent Care

## 2015-07-28 ENCOUNTER — Encounter: Payer: Self-pay | Admitting: Urgent Care

## 2015-07-28 VITALS — BP 122/70 | HR 86 | Temp 98.0°F | Resp 16 | Ht 70.5 in | Wt 232.0 lb

## 2015-07-28 DIAGNOSIS — R5383 Other fatigue: Secondary | ICD-10-CM | POA: Diagnosis not present

## 2015-07-28 DIAGNOSIS — E782 Mixed hyperlipidemia: Secondary | ICD-10-CM

## 2015-07-28 DIAGNOSIS — F419 Anxiety disorder, unspecified: Secondary | ICD-10-CM

## 2015-07-28 DIAGNOSIS — E039 Hypothyroidism, unspecified: Secondary | ICD-10-CM

## 2015-07-28 DIAGNOSIS — M545 Low back pain: Secondary | ICD-10-CM | POA: Diagnosis not present

## 2015-07-28 DIAGNOSIS — IMO0001 Reserved for inherently not codable concepts without codable children: Secondary | ICD-10-CM

## 2015-07-28 DIAGNOSIS — E1165 Type 2 diabetes mellitus with hyperglycemia: Secondary | ICD-10-CM

## 2015-07-28 DIAGNOSIS — E118 Type 2 diabetes mellitus with unspecified complications: Secondary | ICD-10-CM

## 2015-07-28 DIAGNOSIS — F418 Other specified anxiety disorders: Secondary | ICD-10-CM

## 2015-07-28 DIAGNOSIS — F17200 Nicotine dependence, unspecified, uncomplicated: Secondary | ICD-10-CM

## 2015-07-28 DIAGNOSIS — F329 Major depressive disorder, single episode, unspecified: Secondary | ICD-10-CM | POA: Diagnosis not present

## 2015-07-28 DIAGNOSIS — F32A Depression, unspecified: Secondary | ICD-10-CM

## 2015-07-28 LAB — THYROID PANEL WITH TSH
Free Thyroxine Index: 2.9 (ref 1.4–3.8)
T3 Uptake: 30 % (ref 22–35)
T4, Total: 9.7 ug/dL (ref 4.5–12.0)
TSH: 1.9 mIU/L (ref 0.40–4.50)

## 2015-07-28 LAB — LIPID PANEL
Cholesterol: 145 mg/dL (ref 125–200)
HDL: 21 mg/dL — ABNORMAL LOW (ref >=40)
LDL Cholesterol: 66 mg/dL (ref <130)
Total CHOL/HDL Ratio: 6.9 Ratio — ABNORMAL HIGH (ref <=5.0)
Triglycerides: 288 mg/dL — ABNORMAL HIGH (ref <150)
VLDL: 58 mg/dL — ABNORMAL HIGH (ref <30)

## 2015-07-28 LAB — POCT CBC
Granulocyte percent: 63.9 %G (ref 37–80)
HCT, POC: 43 % — AB (ref 43.5–53.7)
Hemoglobin: 15.5 g/dL (ref 14.1–18.1)
Lymph, poc: 2.3 (ref 0.6–3.4)
MCH, POC: 29.4 pg (ref 27–31.2)
MCHC: 36 g/dL — AB (ref 31.8–35.4)
MCV: 81.8 fL (ref 80–97)
MID (cbc): 0.8 (ref 0–0.9)
MPV: 7.1 fL (ref 0–99.8)
POC Granulocyte: 5.5 (ref 2–6.9)
POC LYMPH PERCENT: 26.7 %L (ref 10–50)
POC MID %: 9.4 %M (ref 0–12)
Platelet Count, POC: 228 10*3/uL (ref 142–424)
RBC: 5.26 M/uL (ref 4.69–6.13)
RDW, POC: 15.2 %
WBC: 8.6 10*3/uL (ref 4.6–10.2)

## 2015-07-28 LAB — COMPLETE METABOLIC PANEL WITH GFR
ALT: 14 U/L (ref 9–46)
AST: 14 U/L (ref 10–35)
Albumin: 4.6 g/dL (ref 3.6–5.1)
Alkaline Phosphatase: 82 U/L (ref 40–115)
BUN: 11 mg/dL (ref 7–25)
CO2: 25 mmol/L (ref 20–31)
Calcium: 9.6 mg/dL (ref 8.6–10.3)
Chloride: 100 mmol/L (ref 98–110)
Creat: 0.8 mg/dL (ref 0.70–1.33)
GFR, Est African American: >89 mL/min (ref >=60)
GFR, Est Non African American: >89 mL/min (ref >=60)
Glucose, Bld: 156 mg/dL — ABNORMAL HIGH (ref 65–99)
Potassium: 4.4 mmol/L (ref 3.5–5.3)
Sodium: 134 mmol/L — ABNORMAL LOW (ref 135–146)
Total Bilirubin: 0.4 mg/dL (ref 0.2–1.2)
Total Protein: 7.3 g/dL (ref 6.1–8.1)

## 2015-07-28 LAB — POCT GLYCOSYLATED HEMOGLOBIN (HGB A1C): Hemoglobin A1C: 7.8

## 2015-07-28 MED ORDER — DULOXETINE HCL 60 MG PO CPEP: 60.0000 mg | ORAL_CAPSULE | Freq: Every day | ORAL | Status: DC

## 2015-07-28 MED ORDER — MELOXICAM 7.5 MG PO TABS: 7.5000 mg | ORAL_TABLET | Freq: Every day | ORAL | Status: DC

## 2015-07-28 MED ORDER — DAPAGLIFLOZIN PRO-METFORMIN ER 5-1000 MG PO TB24: 2.0000 | ORAL_TABLET | Freq: Every day | ORAL | Status: DC

## 2015-07-28 MED ORDER — ARIPIPRAZOLE 15 MG PO TABS: 15.0000 mg | ORAL_TABLET | Freq: Every day | ORAL | Status: DC

## 2015-07-28 NOTE — Patient Instructions (Addendum)
Diabetes Mellitus and Food It is important for you to manage your blood sugar (glucose) level. Your blood glucose level can be greatly affected by what you eat. Eating healthier foods in the appropriate amounts throughout the day at about the same time each day will help you control your blood glucose level. It can also help slow or prevent worsening of your diabetes mellitus. Healthy eating may even help you improve the level of your blood pressure and reach or maintain a healthy weight.  General recommendations for healthful eating and cooking habits include:  Eating meals and snacks regularly. Avoid going long periods of time without eating to lose weight.  Eating a diet that consists mainly of plant-based foods, such as fruits, vegetables, nuts, legumes, and whole grains.  Using low-heat cooking methods, such as baking, instead of high-heat cooking methods, such as deep frying. Work with your dietitian to make sure you understand how to use the Nutrition Facts information on food labels. HOW CAN FOOD AFFECT ME? Carbohydrates Carbohydrates affect your blood glucose level more than any other type of food. Your dietitian will help you determine how many carbohydrates to eat at each meal and teach you how to count carbohydrates. Counting carbohydrates is important to keep your blood glucose at a healthy level, especially if you are using insulin or taking certain medicines for diabetes mellitus. Alcohol Alcohol can cause sudden decreases in blood glucose (hypoglycemia), especially if you use insulin or take certain medicines for diabetes mellitus. Hypoglycemia can be a life-threatening condition. Symptoms of hypoglycemia (sleepiness, dizziness, and disorientation) are similar to symptoms of having too much alcohol.  If your health care provider has given you approval to drink alcohol, do so in moderation and use the following guidelines:  Women should not have more than one drink per day, and men  should not have more than two drinks per day. One drink is equal to:  12 oz of beer.  5 oz of wine.  1 oz of hard liquor.  Do not drink on an empty stomach.  Keep yourself hydrated. Have water, diet soda, or unsweetened iced tea.  Regular soda, juice, and other mixers might contain a lot of carbohydrates and should be counted. WHAT FOODS ARE NOT RECOMMENDED? As you make food choices, it is important to remember that all foods are not the same. Some foods have fewer nutrients per serving than other foods, even though they might have the same number of calories or carbohydrates. It is difficult to get your body what it needs when you eat foods with fewer nutrients. Examples of foods that you should avoid that are high in calories and carbohydrates but low in nutrients include:  Trans fats (most processed foods list trans fats on the Nutrition Facts label).  Regular soda.  Juice.  Candy.  Sweets, such as cake, pie, doughnuts, and cookies.  Fried foods. WHAT FOODS CAN I EAT? Eat nutrient-rich foods, which will nourish your body and keep you healthy. The food you should eat also will depend on several factors, including:  The calories you need.  The medicines you take.  Your weight.  Your blood glucose level.  Your blood pressure level.  Your cholesterol level. You should eat a variety of foods, including:  Protein.  Lean cuts of meat.  Proteins low in saturated fats, such as fish, egg whites, and beans. Avoid processed meats.  Fruits and vegetables.  Fruits and vegetables that may help control blood glucose levels, such as apples, mangoes, and   yams.  Dairy products.  Choose fat-free or low-fat dairy products, such as milk, yogurt, and cheese.  Grains, bread, pasta, and rice.  Choose whole grain products, such as multigrain bread, whole oats, and brown rice. These foods may help control blood pressure.  Fats.  Foods containing healthful fats, such as nuts,  avocado, olive oil, canola oil, and fish. DOES EVERYONE WITH DIABETES MELLITUS HAVE THE SAME MEAL PLAN? Because every person with diabetes mellitus is different, there is not one meal plan that works for everyone. It is very important that you meet with a dietitian who will help you create a meal plan that is just right for you.   This information is not intended to replace advice given to you by your health care provider. Make sure you discuss any questions you have with your health care provider.   Document Released: 12/13/2004 Document Revised: 04/08/2014 Document Reviewed: 02/12/2013 Elsevier Interactive Patient Education 2016 [employer].    Smoking Cessation, Tips for Success If you are ready to quit smoking, congratulations! You have chosen to help yourself be healthier. Cigarettes bring nicotine, tar, carbon monoxide, and other irritants into your body. Your lungs, heart, and blood vessels will be able to work better without these poisons. There are many different ways to quit smoking. Nicotine gum, nicotine patches, a nicotine inhaler, or nicotine nasal spray can help with physical craving. Hypnosis, support groups, and medicines help break the habit of smoking. WHAT THINGS CAN I DO TO MAKE QUITTING EASIER?  Here are some tips to help you quit for good:  Pick a date when you will quit smoking completely. Tell all of your friends and family about your plan to quit on that date.  Do not try to slowly cut down on the number of cigarettes you are smoking. Pick a quit date and quit smoking completely starting on that day.  Throw away all cigarettes.   Clean and remove all ashtrays from your home, work, and car.  On a card, write down your reasons for quitting. Carry the card with you and read it when you get the urge to smoke.  Cleanse your body of nicotine. Drink enough water and fluids to keep your urine clear or pale yellow. Do this after quitting to flush the nicotine from your  body.  Learn to predict your moods. Do not let a bad situation be your excuse to have a cigarette. Some situations in your life might tempt you into wanting a cigarette.  Never have "just one" cigarette. It leads to wanting another and another. Remind yourself of your decision to quit.  Change habits associated with smoking. If you smoked while driving or when feeling stressed, try other activities to replace smoking. Stand up when drinking your coffee. Brush your teeth after eating. Sit in a different chair when you read the paper. Avoid alcohol while trying to quit, and try to drink fewer caffeinated beverages. Alcohol and caffeine may urge you to smoke.  Avoid foods and drinks that can trigger a desire to smoke, such as sugary or spicy foods and alcohol.  Ask people who smoke not to smoke around you.  Have something planned to do right after eating or having a cup of coffee. For example, plan to take a walk or exercise.  Try a relaxation exercise to calm you down and decrease your stress. Remember, you may be tense and nervous for the first 2 weeks after you quit, but this will pass.  Find new activities  to keep your hands busy. Play with a pen, coin, or rubber band. Doodle or draw things on paper.  Brush your teeth right after eating. This will help cut down on the craving for the taste of tobacco after meals. You can also try mouthwash.   Use oral substitutes in place of cigarettes. Try using lemon drops, carrots, cinnamon sticks, or chewing gum. Keep them handy so they are available when you have the urge to smoke.  When you have the urge to smoke, try deep breathing.  Designate your home as a nonsmoking area.  If you are a heavy smoker, ask your health care provider about a prescription for nicotine chewing gum. It can ease your withdrawal from nicotine.  Reward yourself. Set aside the cigarette money you save and buy yourself something nice.  Look for support from others. Join  a support group or smoking cessation program. Ask someone at home or at work to help you with your plan to quit smoking.  Always ask yourself, "Do I need this cigarette or is this just a reflex?" Tell yourself, "Today, I choose not to smoke," or "I do not want to smoke." You are reminding yourself of your decision to quit.  Do not replace cigarette smoking with electronic cigarettes (commonly called e-cigarettes). The safety of e-cigarettes is unknown, and some may contain harmful chemicals.  If you relapse, do not give up! Plan ahead and think about what you will do the next time you get the urge to smoke. HOW WILL I FEEL WHEN I QUIT SMOKING? You may have symptoms of withdrawal because your body is used to nicotine (the addictive substance in cigarettes). You may crave cigarettes, be irritable, feel very hungry, cough often, get headaches, or have difficulty concentrating. The withdrawal symptoms are only temporary. They are strongest when you first quit but will go away within 10-14 days. When withdrawal symptoms occur, stay in control. Think about your reasons for quitting. Remind yourself that these are signs that your body is healing and getting used to being without cigarettes. Remember that withdrawal symptoms are easier to treat than the major diseases that smoking can cause.  Even after the withdrawal is over, expect periodic urges to smoke. However, these cravings are generally short lived and will go away whether you smoke or not. Do not smoke! WHAT RESOURCES ARE AVAILABLE TO HELP ME QUIT SMOKING? Your health care provider can direct you to community resources or hospitals for support, which may include:  Group support.  Education.  Hypnosis.  Therapy.   This information is not intended to replace advice given to you by your health care provider. Make sure you discuss any questions you have with your health care provider.   Document Released: 12/15/2003 Document Revised: 04/08/2014  Document Reviewed: 09/03/2012 Elsevier Interactive Patient Education 2016 [employer]     IF you received an x-ray today, you will receive an invoice from [facility]. Please contact [facility] at [phone] with questions or concerns regarding your invoice.   IF you received labwork today, you will receive an invoice from [facility]. Please contact [facility] at [phone] with questions or concerns regarding your invoice.   Our billing staff will not be able to assist you with questions regarding bills from these companies.  You will be contacted with the lab results as soon as they are available. The fastest way to get your results is to activate your My Chart account. Instructions are located on the last page  of this paperwork. If you have not heard from [place] regarding the results in 2 weeks, please contact this office.

## 2015-07-28 NOTE — Progress Notes (Signed)
MRN: [id number] DOB: October 07, 1960  Subjective:   Robert Wise is a 55 y.o. male presenting for chief complaint of Medication Refill and Labwork  DM - Managed with Xigduo. He is no longer taking glipizide because his blood sugar has been between 100-150. Admits non-compliant diet. Does not exercise. Admits occasional blurred vision. Denies chest pain, n/v, abdominal pain, hematuria, lower leg swelling, numbness or tingling.   Anxiety/Depression - Managed Cymbalta with Abilify. Reports that he improved slightly in the past 3 months. His work did change, his boss added a second driver and has decreased some of the work load from the patient. States that he felt a better mood and has had more energy. However, lately he feels like he's headed back downhill. Would like to consider increasing Abilify. Denies irritability, SI, HI, confusion, AMS, fever, chills, ROS as above.  Back Pain - Reports intermittent low back pain. 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 Has tried using Flexeril with good results. However, he does not Denies trauma, shooting pain, incontinence.   Tobacco Use - Reports that he is trying to cut back. Currently smoking 1.5ppd. He has previously used patches which failed. He cannot use Chantix or Wellbutrin due to his anxiety/depression. Has >30 pack year history.  Robert Wise has a current medication list which includes the following prescription(s): alprazolam, aripiprazole, atorvastatin, one touch ultra system kit, cyclobenzaprine, dapagliflozin-metformin hcl er, duloxetine, fish oil-omega-3 fatty acids, glucose blood, onetouch ultrasoft, levothyroxine, lisinopril, omeprazole, ondansetron, glipizide, and naproxen sodium. Also is allergic to tricor and zocor.  Robert Wise  has a past medical history of Diabetes mellitus without complication ([place]); Hyperlipidemia; OSA (obstructive sleep apnea) (12/15/2012);  Depression; and Anxiety. Also  has past surgical history that includes Ankle surgery (Left, 2006) and Vasectomy.  Objective:   Vitals: BP 122/70 mmHg  Pulse 86  Temp(Src) 98 F (36.7 C) (Oral)  Resp 16  Ht 5' 10.5" (1.791 m)  Wt 232 lb (105.235 kg)  BMI 32.81 kg/m2  SpO2 97%  Physical Exam  Constitutional: He is oriented to person, place, and time. He appears well-developed and well-nourished.  HENT:  Mouth/Throat: Oropharynx is clear and moist.  Eyes: Pupils are equal, round, and reactive to light. Right eye exhibits no discharge. Left eye exhibits no discharge. No scleral icterus.  Neck: Normal range of motion. Neck supple. No thyromegaly present.  Cardiovascular: Normal rate, regular rhythm and intact distal pulses.  Exam reveals no gallop and no friction rub.   No murmur heard. Pulmonary/Chest: No respiratory distress. He has no wheezes. He has no rales.  Abdominal: Soft. Bowel sounds are normal. He exhibits no distension and no mass. There is no tenderness.  Musculoskeletal: He exhibits no edema.       Lumbar back: He exhibits spasm (over paraspinal muscles). He exhibits normal range of motion, no tenderness, no bony tenderness, no swelling and no edema.  Feet without any ulcerations, Dorsalis Pedis, Posterior Tibial 1+ bilaterally.  Neurological: He is alert and oriented to person, place, and time.  Skin: Skin is warm and dry.   Results for orders placed or performed in visit on 07/28/15 (from the past 24 hour(s))  POCT CBC     Status: Abnormal   Collection Time: 07/28/15  8:50 AM  Result Value Ref Range   WBC 8.6 4.6 - 10.2 K/uL   Lymph, poc 2.3 0.6 - 3.4   POC  LYMPH PERCENT 26.7 10 - 50 %L   MID (cbc) 0.8 0 - 0.9   POC MID % 9.4 0 - 12 %M   POC Granulocyte 5.5 2 - 6.9   Granulocyte percent 63.9 37 - 80 %G   RBC 5.26 4.69 - 6.13 M/uL   Hemoglobin 15.5 14.1 - 18.1 g/dL   HCT, POC 43.0 (A) 43.5 - 53.7 %   MCV 81.8 80 - 97 fL   MCH, POC 29.4 27 - 31.2 pg   MCHC 36.0  (A) 31.8 - 35.4 g/dL   RDW, POC 15.2 %   Platelet Count, POC 228 142 - 424 K/uL   MPV 7.1 0 - 99.8 fL  POCT glycosylated hemoglobin (Hb A1C)     Status: None   Collection Time: 07/28/15  8:54 AM  Result Value Ref Range   Hemoglobin A1C 7.8    Assessment and Plan :   1. Uncontrolled type 2 diabetes mellitus without complication, without long-term current use of insulin ([place]) - Significantly improved, discontinue glipizide. Continue Xigduo.   2. Anxiety and depression 3. Other fatigue - Increase Abilify to 2m, maintain Cymbalta 655m recheck in 5 months or sooner if necessary. - POCT CBC - DULoxetine (CYMBALTA) 60 MG capsule; Take 1 capsule (60 mg total) by mouth daily.  Dispense: 30 capsule; Refill: 5 - ARIPiprazole (ABILIFY) 15 MG tablet; Take 1 tablet (15 mg total) by mouth daily.  Dispense: 30 tablet; Refill: 5  4. Low back pain without sciatica, unspecified back pain laterality - meloxicam (MOBIC) 7.5 MG tablet; Take 1 tablet (7.5 mg total) by mouth daily.  Dispense: 30 tablet; Refill: 3  5. Mixed hyperlipidemia - Lipid panel  6. Hypothyroidism, unspecified hypothyroidism type - Thyroid Panel With TSH  7. Tobacco use disorder - Patient states that his mind isn't ready to fully commit to quitting now. I counseled on risk factors. He is not a good candidate for trial of Chantix or Wellbutrin at the moment. Patient will continue on efforts to cut back.  Robert Wise [facility] [phone] 9130/28/2017 8:32 AM

## 2015-07-29 ENCOUNTER — Other Ambulatory Visit: Payer: Self-pay | Admitting: Urgent Care

## 2015-07-29 DIAGNOSIS — E039 Hypothyroidism, unspecified: Secondary | ICD-10-CM

## 2015-07-29 MED ORDER — LEVOTHYROXINE SODIUM 150 MCG PO TABS: 150.0000 ug | ORAL_TABLET | Freq: Every day | ORAL | Status: DC

## 2015-07-29 NOTE — Progress Notes (Signed)
Thyroid panel normal. Continue current regimen. RF sent. Cholesterol levels improved but still elevated. Recommended continuing current regimen. Patient notified by VM.

## 2015-08-11 ENCOUNTER — Other Ambulatory Visit: Payer: Self-pay | Admitting: Urgent Care

## 2015-09-15 ENCOUNTER — Other Ambulatory Visit: Payer: Self-pay | Admitting: Urgent Care

## 2015-09-18 ENCOUNTER — Encounter: Payer: Self-pay | Admitting: Urgent Care

## 2015-09-18 ENCOUNTER — Other Ambulatory Visit: Payer: Self-pay

## 2015-09-18 DIAGNOSIS — F32A Depression, unspecified: Secondary | ICD-10-CM

## 2015-09-18 DIAGNOSIS — F419 Anxiety disorder, unspecified: Principal | ICD-10-CM

## 2015-09-18 DIAGNOSIS — F329 Major depressive disorder, single episode, unspecified: Secondary | ICD-10-CM

## 2015-09-18 MED ORDER — ARIPIPRAZOLE 15 MG PO TABS: 15.0000 mg | ORAL_TABLET | Freq: Every day | ORAL | Status: DC

## 2015-11-05 ENCOUNTER — Other Ambulatory Visit: Payer: Self-pay | Admitting: Urgent Care

## 2015-11-06 NOTE — Telephone Encounter (Signed)
Urban GibsonMani, you saw this pt for med refill check up in April, but I don't see GERD discussed. Do you want to give pt a 90 day RF to cover him until he would be due for 6 mos check up, or have him come back in sooner?

## 2015-11-14 ENCOUNTER — Other Ambulatory Visit: Payer: Self-pay

## 2015-11-14 DIAGNOSIS — F419 Anxiety disorder, unspecified: Principal | ICD-10-CM

## 2015-11-14 DIAGNOSIS — F32A Depression, unspecified: Secondary | ICD-10-CM

## 2015-11-14 DIAGNOSIS — F329 Major depressive disorder, single episode, unspecified: Secondary | ICD-10-CM

## 2015-11-14 MED ORDER — ARIPIPRAZOLE 15 MG PO TABS: 15.0000 mg | ORAL_TABLET | Freq: Every day | ORAL | 0 refills | Status: DC

## 2015-12-29 ENCOUNTER — Ambulatory Visit (INDEPENDENT_AMBULATORY_CARE_PROVIDER_SITE_OTHER): Payer: Managed Care, Other (non HMO) | Admitting: Urgent Care

## 2015-12-29 VITALS — BP 132/74 | HR 72 | Temp 98.1°F | Resp 16 | Ht 70.0 in | Wt 231.4 lb

## 2015-12-29 DIAGNOSIS — F32A Depression, unspecified: Secondary | ICD-10-CM

## 2015-12-29 DIAGNOSIS — F329 Major depressive disorder, single episode, unspecified: Secondary | ICD-10-CM

## 2015-12-29 DIAGNOSIS — Z23 Encounter for immunization: Secondary | ICD-10-CM | POA: Diagnosis not present

## 2015-12-29 DIAGNOSIS — IMO0002 Reserved for concepts with insufficient information to code with codable children: Secondary | ICD-10-CM

## 2015-12-29 DIAGNOSIS — E782 Mixed hyperlipidemia: Secondary | ICD-10-CM

## 2015-12-29 DIAGNOSIS — E118 Type 2 diabetes mellitus with unspecified complications: Secondary | ICD-10-CM | POA: Diagnosis not present

## 2015-12-29 DIAGNOSIS — E1165 Type 2 diabetes mellitus with hyperglycemia: Secondary | ICD-10-CM | POA: Diagnosis not present

## 2015-12-29 DIAGNOSIS — F418 Other specified anxiety disorders: Secondary | ICD-10-CM

## 2015-12-29 DIAGNOSIS — F419 Anxiety disorder, unspecified: Principal | ICD-10-CM

## 2015-12-29 LAB — COMPREHENSIVE METABOLIC PANEL
ALT: 20 U/L (ref 9–46)
AST: 19 U/L (ref 10–35)
Albumin: 4.6 g/dL (ref 3.6–5.1)
Alkaline Phosphatase: 72 U/L (ref 40–115)
BUN: 13 mg/dL (ref 7–25)
CO2: 24 mmol/L (ref 20–31)
Calcium: 9.3 mg/dL (ref 8.6–10.3)
Chloride: 102 mmol/L (ref 98–110)
Creat: 0.72 mg/dL (ref 0.70–1.33)
Glucose, Bld: 136 mg/dL — ABNORMAL HIGH (ref 65–99)
Potassium: 4.2 mmol/L (ref 3.5–5.3)
Sodium: 138 mmol/L (ref 135–146)
Total Bilirubin: 0.4 mg/dL (ref 0.2–1.2)
Total Protein: 7 g/dL (ref 6.1–8.1)

## 2015-12-29 LAB — LIPID PANEL
Cholesterol: 157 mg/dL (ref 125–200)
HDL: 24 mg/dL — ABNORMAL LOW (ref >=40)
LDL Cholesterol: 87 mg/dL (ref <130)
Total CHOL/HDL Ratio: 6.5 Ratio — ABNORMAL HIGH (ref <=5.0)
Triglycerides: 232 mg/dL — ABNORMAL HIGH (ref <150)
VLDL: 46 mg/dL — ABNORMAL HIGH (ref <30)

## 2015-12-29 MED ORDER — BUSPIRONE HCL 7.5 MG PO TABS: 7.5000 mg | ORAL_TABLET | Freq: Two times a day (BID) | ORAL | 1 refills | Status: DC

## 2015-12-29 NOTE — Progress Notes (Signed)
MRN: [id number] DOB: Oct 03, 1960  Subjective:   Robert Wise is a 55 y.o. male presenting for follow up on depression.   Depression - At his last visit in 07/28/2015, patient was doing much better with Cymbalta and Abilify. His stressors at work had improved. In the past 6 weeks patient has felt lower energy, inability to enjoy his activities, feeling down mood. Denies SI, HI. He cannot identify any triggers. However, admits that his boss is still causing him stress.   DM - Currently managed with Xigduo. Fasting blood sugar is 120's-130's. Admits dietary non-compliance. Exercises some. Denies polydipsia, polyuria, blurred vision, chest pain, shortness of breath, heart racing, palpitations, nausea, vomiting, abdominal pain, hematuria, skin infections.   HL - Managed with Omega 3 fatty acids. Diet and exercise as above.  Robert Wise has a current medication list which includes the following prescription(s): aripiprazole, one touch ultra system kit, cyclobenzaprine, duloxetine, duloxetine, fish oil-omega-3 fatty acids, glucose blood, onetouch ultrasoft, levothyroxine, lisinopril, omeprazole, alprazolam, atorvastatin, dapagliflozin-metformin hcl er, meloxicam, and ondansetron. Also is allergic to tricor [fenofibrate] and zocor [simvastatin].  Robert Wise  has a past medical history of Anxiety; Depression; Diabetes mellitus without complication ([place]); Hyperlipidemia; and OSA (obstructive sleep apnea) (12/15/2012). Also  has a past surgical history that includes Ankle surgery (Left, 2006) and Vasectomy.  Objective:   Vitals: BP 132/74   Pulse 72   Temp 98.1 F (36.7 C) (Oral)   Resp 16   Ht 5' 10"  (1.778 m)   Wt 231 lb 6.4 oz (105 kg)   SpO2 98%   BMI 33.20 kg/m   Physical Exam  Constitutional: He is oriented to person, place, and time. He appears well-developed and well-nourished.  HENT:  Mouth/Throat: Oropharynx is clear and moist.  Eyes: No scleral icterus.  Neck: Normal range of motion.  Neck supple.  Cardiovascular: Normal rate, regular rhythm and intact distal pulses.  Exam reveals no gallop and no friction rub.   No murmur heard. Pulmonary/Chest: No respiratory distress. He has no wheezes. He has no rales.  Abdominal: Soft. Bowel sounds are normal. He exhibits no distension and no mass. There is no tenderness.  Musculoskeletal: He exhibits no edema.  Neurological: He is alert and oriented to person, place, and time.  Skin: Skin is warm and dry.   Diabetic Foot Form - Detailed   Diabetic Foot Exam - detailed Diabetic Foot exam was performed with the following findings:  Yes 12/29/2015  9:15 AM  Visual Foot Exam completed.:  Yes  Is there a history of foot ulcer?:  No Can the patient see the bottom of their feet?:  Yes Are the shoes appropriate in style and fit?:  No Is there swelling or and abnormal foot shape?:  No Are the toenails long?:  Yes Are the toenails thick?:  No Do you have pain in calf while walking?:  No Is there a claw toe deformity?:  No Is there elevated skin temparature?:  No Is there limited skin dorsiflexion?:  No Is there foot or ankle muscle weakness?:  No Are the toenails ingrown?:  No Normal Range of Motion:  Yes Pulse Foot Exam completed.:  Yes  Sensory Foot Exam Completed.:  Yes Swelling:  No Semmes-Weinstein Monofilament Test R Foot Test Control:  Pos L Foot Test Control:  Pos  R Site 1-Great Toe:  Pos L Site 1-Great Toe:  Pos  R Site 4:  Pos L Site 4:  Pos  R Site 5:  Pos L Site 5:  Pos       Assessment and Plan :   1. Anxiety and depression - Discussed medical therapy at length with patient. This includes risks of serotonin syndrome with Cymbalta, Buspar. If these symptoms develop, he will stop Buspar and rtc. Continue Abilify. Strongly emphasized again that patient has a severe stressor with his job and really should find another job. He has gone back and forth about this. For now, he will continue working his current job and try  to go to a psychiatrist and therapist.  2. Uncontrolled type 2 diabetes mellitus with complication, without long-term current use of insulin ([place]) - Labs pending, refill medication as appropriate. Emphasized diabetic friendly diet. Continue exercise.  3. Mixed hyperlipidemia - Labs pending  4. Flu vaccine need - Flu Vaccine QUAD 36+ mos IM   Jaynee Eagles, PA-C [facility] [phone] 12/29/2015 8:51 AM

## 2015-12-29 NOTE — Patient Instructions (Addendum)
Buspar (buspirone) - Start with 1 tablet daily for 1 week. Then if you are not having side effects increase to 1 tablet twice daily. Please make sure you are in touch with your insurance provider to establish care with a psychiatrist and therapist.   Buspirone tablets What is this medicine? BUSPIRONE (byoo SPYE rone) is used to treat anxiety disorders. This medicine may be used for other purposes; ask your health care provider or pharmacist if you have questions. What should I tell my health care provider before I take this medicine? They need to know if you have any of these conditions: -kidney or liver disease -an unusual or allergic reaction to buspirone, other medicines, foods, dyes, or preservatives -pregnant or trying to get pregnant -breast-feeding How should I use this medicine? Take this medicine by mouth with a glass of water. Follow the directions on the prescription label. You may take this medicine with or without food. To ensure that this medicine always works the same way for you, you should take it either always with or always without food. Take your doses at regular intervals. Do not take your medicine more often than directed. Do not stop taking except on the advice of your doctor or health care professional. Talk to your pediatrician regarding the use of this medicine in children. Special care may be needed. Overdosage: If you think you have taken too much of this medicine contact a poison control center or emergency room at once. NOTE: This medicine is only for you. Do not share this medicine with others. What if I miss a dose? If you miss a dose, take it as soon as you can. If it is almost time for your next dose, take only that dose. Do not take double or extra doses. What may interact with this medicine? Do not take this medicine with any of the following medications: -linezolid -MAOIs like Carbex, Eldepryl, Marplan, Nardil, and Parnate -methylene  blue -procarbazine This medicine may also interact with the following medications: -diazepam -digoxin -diltiazem -erythromycin -grapefruit juice -haloperidol -medicines for mental depression or mood problems -medicines for seizures like carbamazepine, phenobarbital and phenytoin -nefazodone -other medications for anxiety -rifampin -ritonavir -some antifungal medicines like itraconazole, ketoconazole, and voriconazole -verapamil -warfarin This list may not describe all possible interactions. Give your health care provider a list of all the medicines, herbs, non-prescription drugs, or dietary supplements you use. Also tell them if you smoke, drink alcohol, or use illegal drugs. Some items may interact with your medicine. What should I watch for while using this medicine? Visit your doctor or health care professional for regular checks on your progress. It may take 1 to 2 weeks before your anxiety gets better. You may get drowsy or dizzy. Do not drive, use machinery, or do anything that needs mental alertness until you know how this drug affects you. Do not stand or sit up quickly, especially if you are an older patient. This reduces the risk of dizzy or fainting spells. Alcohol can make you more drowsy and dizzy. Avoid alcoholic drinks. What side effects may I notice from receiving this medicine? Side effects that you should report to your doctor or health care professional as soon as possible: -blurred vision or other vision changes -chest pain -confusion -difficulty breathing -feelings of hostility or anger -muscle aches and pains -numbness or tingling in hands or feet -ringing in the ears -skin rash and itching -vomiting -weakness Side effects that usually do not require medical attention (report to your doctor  or health care professional if they continue or are bothersome): -disturbed dreams, nightmares -headache -nausea -restlessness or nervousness -sore throat and nasal  congestion -stomach upset This list may not describe all possible side effects. Call your doctor for medical advice about side effects. You may report side effects to FDA at 1-800-FDA-1088. Where should I keep my medicine? Keep out of the reach of children. Store at room temperature below 30 degrees C (86 degrees F). Protect from light. Keep container tightly closed. Throw away any unused medicine after the expiration date. NOTE: This sheet is a summary. It may not cover all possible information. If you have questions about this medicine, talk to your doctor, pharmacist, or health care provider.    2016, Elsevier/Gold Standard. (2009-10-26 18:06:11)    Serotonin Syndrome Serotonin is a brain chemical that regulates the nervous system, which includes the brain, spinal cord, and nerves. Serotonin appears to play a role in all types of behavior, including appetite, emotions, movement, thinking, and response to stress. Excessively high levels of serotonin in the body can cause serotonin syndrome, which is a very dangerous condition. CAUSES This condition can be caused by taking medicines or drugs that increase the level of serotonin in your body. These include:  Antidepressant medicines.  Migraine medicines.  Certain pain medicines.  Certain recreational drugs, including ecstasy, LSD, cocaine, and amphetamines.  Over-the-counter cough or cold medicines that contain dextromethorphan.  Certain herbal supplements, including St. John's wort, ginseng, and nutmeg. This condition usually occurs when you take these medicines or drugs in combination, but it can also happen with a high dose of a single medicine or drug. RISK FACTORS This condition is more likely to develop in:  People who have recently increased the dosage of medicine that increases the serotonin level.  People who just started taking medicine that increases the serotonin level. SYMPTOMS Symptoms of this condition usually  happens within several hours of a medicine change. Symptoms include:  Headache.  Muscle twitching or stiffness.  Diarrhea.  Confusion.  Restlessness or agitation.  Shivering or goose bumps.  Loss of muscle coordination.  Rapid heart rate.  Sweating. Severe cases of serotonin syndromecan cause:  Irregular heartbeat.  Seizures.  Loss of consciousness.  High fever. DIAGNOSIS This condition is diagnosed with a medical history and physical exam. You will be asked aboutyour symptoms and your use of medicines and recreational drugs. Your health care provider may also order lab work or additional tests to rule out other causes of your symptoms. TREATMENT The treatment for this condition depends on the severity of your symptoms. For mild cases, stopping the medicine that caused your condition is usually all that is needed. For moderate to severe cases, hospitalization is required to monitor you and to prevent further muscle damage. HOME CARE INSTRUCTIONS  Take over-the-counter and prescription medicines only as told by your health care provider. This is important.  Check with your health care provider before you start taking any new prescriptions, over-the-counter medicines, herbs, or supplements.  Avoid combining any medicines that can cause this condition to occur.  Keep all follow-up visits as told by your health care provider.This is important.  Maintain a healthy lifestyle.  Eat healthy foods.  Get plenty of sleep.  Exercise regularly.  Do not drink alcohol.  Do not use recreational drugs. SEEK MEDICAL CARE IF:  Medicines do not seem to be helping.  Your symptoms do not improve or they get worse.  You have trouble taking care of yourself. SEEK  IMMEDIATE MEDICAL CARE IF:  You have worsening confusion, severe headache, chest pain, high fever, seizures, or loss of consciousness.  You have serious thoughts about hurting yourself or others.  You experience  serious side effects of medicine, such as swelling of your face, lips, tongue, or throat.   This information is not intended to replace advice given to you by your health care provider. Make sure you discuss any questions you have with your health care provider.   Document Released: 04/25/2004 Document Revised: 08/02/2014 Document Reviewed: 03/31/2014 Elsevier Interactive Patient Education [employer]

## 2015-12-30 LAB — HEMOGLOBIN A1C
Hgb A1c MFr Bld: 7.8 % — ABNORMAL HIGH (ref <5.7)
Mean Plasma Glucose: 177 mg/dL

## 2016-01-02 ENCOUNTER — Other Ambulatory Visit: Payer: Self-pay | Admitting: Urgent Care

## 2016-01-02 DIAGNOSIS — E1165 Type 2 diabetes mellitus with hyperglycemia: Principal | ICD-10-CM

## 2016-01-02 DIAGNOSIS — IMO0001 Reserved for inherently not codable concepts without codable children: Secondary | ICD-10-CM

## 2016-01-02 MED ORDER — DAPAGLIFLOZIN PRO-METFORMIN ER 5-1000 MG PO TB24: 2.0000 | ORAL_TABLET | Freq: Every day | ORAL | 1 refills | Status: DC

## 2016-01-06 ENCOUNTER — Other Ambulatory Visit: Payer: Self-pay | Admitting: Urgent Care

## 2016-01-06 DIAGNOSIS — E1165 Type 2 diabetes mellitus with hyperglycemia: Secondary | ICD-10-CM

## 2016-01-06 DIAGNOSIS — IMO0002 Reserved for concepts with insufficient information to code with codable children: Secondary | ICD-10-CM

## 2016-01-06 DIAGNOSIS — E118 Type 2 diabetes mellitus with unspecified complications: Principal | ICD-10-CM

## 2016-01-09 NOTE — Telephone Encounter (Signed)
9last dm office visit with labs

## 2016-01-25 ENCOUNTER — Other Ambulatory Visit: Payer: Self-pay

## 2016-01-25 MED ORDER — BUSPIRONE HCL 7.5 MG PO TABS: 7.5000 mg | ORAL_TABLET | Freq: Two times a day (BID) | ORAL | 1 refills | Status: DC

## 2016-01-25 NOTE — Telephone Encounter (Signed)
Sent script for 90 day supply. Please let patient know.

## 2016-01-25 NOTE — Telephone Encounter (Signed)
Pharmacy requests 90 day supply for insurance. Last filled 12/2015 #60 with 1 refill, last seen 12/29/15. Please refill if appropriate

## 2016-02-06 ENCOUNTER — Other Ambulatory Visit: Payer: Self-pay | Admitting: Urgent Care

## 2016-02-07 ENCOUNTER — Other Ambulatory Visit: Payer: Self-pay | Admitting: Urgent Care

## 2016-03-04 ENCOUNTER — Ambulatory Visit (INDEPENDENT_AMBULATORY_CARE_PROVIDER_SITE_OTHER): Payer: Self-pay | Admitting: Physician Assistant

## 2016-03-04 ENCOUNTER — Encounter: Payer: Self-pay | Admitting: Urgent Care

## 2016-03-04 VITALS — BP 132/84 | HR 85 | Temp 98.5°F | Resp 17 | Ht 70.0 in | Wt 233.0 lb

## 2016-03-04 DIAGNOSIS — Z0289 Encounter for other administrative examinations: Secondary | ICD-10-CM

## 2016-03-04 DIAGNOSIS — R81 Glycosuria: Secondary | ICD-10-CM

## 2016-03-04 LAB — POCT GLYCOSYLATED HEMOGLOBIN (HGB A1C): Hemoglobin A1C: 7.7

## 2016-03-04 NOTE — Progress Notes (Signed)
77

## 2016-03-04 NOTE — Patient Instructions (Signed)
     IF you received an x-ray today, you will receive an invoice from [facility]. Please contact [facility] at [phone] with questions or concerns regarding your invoice.   IF you received labwork today, you will receive an invoice from [facility]. Please contact [facility] at [phone] with questions or concerns regarding your invoice.   Our billing staff will not be able to assist you with questions regarding bills from these companies.  You will be contacted with the lab results as soon as they are available. The fastest way to get your results is to activate your My Chart account. Instructions are located on the last page of this paperwork. If you have not heard from us regarding the results in 2 weeks, please contact this office.      

## 2016-03-04 NOTE — Progress Notes (Signed)
Commercial Driver Medical Examination   Robert MorseDavid L Wise is a 55 y.o. male who presents today for a commercial driver fitness determination physical exam. The patient reports no problems today. In the past the patient reports receiving 1 year certificates. he denies focal neurological deficits, vision and hearing changes. He denies the habitual use of benzodiazepines and opioids.  He has lost about 15 lbs and has stopped using his CPAP as he does not need this any more.  He denies any night time awakenings.  His wife says he does snore however this has improved since losing weight.  He also reports that before losing weight he would "wake up drowning" but has not had this problem either since losing weight. He denies any day time sleepiness.    Past Medical History:  Diagnosis Date  . Anxiety   . Depression   . Diabetes mellitus without complication (HCC)   . Hyperlipidemia   . OSA (obstructive sleep apnea) 12/15/2012    Current medications, family history, allergies, social history reviewed by me and exist elsewhere in the encounter.   Review of Systems  Constitutional: Negative for chills and fever.  HENT: Negative for hearing loss.   Eyes: Negative for blurred vision.  Respiratory: Negative for cough and shortness of breath.   Cardiovascular: Negative for chest pain and leg swelling.  Genitourinary: Negative.   Musculoskeletal: Negative for myalgias.  Skin: Negative for rash.  Neurological: Negative for dizziness, tingling, tremors and headaches.  Endo/Heme/Allergies: Negative for polydipsia.    Objective:     Vision/hearing:  Visual Acuity Screening   Right eye Left eye Both eyes  Without correction: 20/50 20/25 20/25   With correction: 20/25 20/25 20/25     Applicant can recognize and distinguish among traffic control signals and devices showing standard red, green, and amber colors.  Corrective lenses required: Yes  Monocular Vision?: No  Hearing aid requirement:  No  Physical Exam  Constitutional: He is oriented to person, place, and time. He appears well-developed and well-nourished. No distress.  Cardiovascular: Normal rate and regular rhythm.   Pulmonary/Chest: Effort normal and breath sounds normal.  Musculoskeletal: Normal range of motion.  Neurological: He is alert and oriented to person, place, and time.  Skin: He is not diaphoretic.    BP 132/84 (BP Location: Left Arm, Patient Position: Sitting, Cuff Size: Large)   Pulse 85   Temp 98.5 F (36.9 C) (Oral)   Resp 17   Ht 5\' 10"  (1.778 m)   Wt 233 lb (105.7 kg)   SpO2 97%   BMI 33.43 kg/m   Labs:  Lab Results  Component Value Date   HGBA1C 7.7 03/04/2016     Assessment:    Healthy male exam.History of sleep apnea however has lost weight and is now not having symptoms.  He denies this problem on his long form.  I am removing this from his problem list as well.    Meets standards, but periodic monitoring required due to multiple risk factors for CAD.Marland Kitchen.  Driver qualified only for 1 year.    Plan:    Medical examiners certificate completed and printed. Return as needed.

## 2016-03-06 ENCOUNTER — Other Ambulatory Visit: Payer: Self-pay | Admitting: Urgent Care

## 2016-03-06 DIAGNOSIS — F419 Anxiety disorder, unspecified: Principal | ICD-10-CM

## 2016-03-06 DIAGNOSIS — F32A Depression, unspecified: Secondary | ICD-10-CM

## 2016-03-06 DIAGNOSIS — F329 Major depressive disorder, single episode, unspecified: Secondary | ICD-10-CM

## 2016-03-06 NOTE — Telephone Encounter (Signed)
12/2015 last office vist 10/2015 last refill x 90 days

## 2016-03-06 NOTE — Telephone Encounter (Signed)
Please let patient know I provided refill and recommend that he follow up in the next month or so. Thank you!

## 2016-03-06 NOTE — Telephone Encounter (Signed)
LMOM advising pt that RF was sent and that [place] GibsonMani would like to see him w/in the next month or so.

## 2016-04-07 ENCOUNTER — Other Ambulatory Visit: Payer: Self-pay | Admitting: Urgent Care

## 2016-04-07 NOTE — Telephone Encounter (Signed)
Last refill 12/2014 for 1 year supply [place] requested follow up for other med refills within the month following 03/10/2016 - No appointment on file Please advise

## 2016-04-08 NOTE — Telephone Encounter (Signed)
Refill provided. Patient is due for follow up on his anxiety and depression. But it is okay to refill Flexeril for his chronic back pain and muscle spasms associated with strenuous labor.

## 2016-04-23 ENCOUNTER — Encounter: Payer: Self-pay | Admitting: Urgent Care

## 2016-04-29 ENCOUNTER — Ambulatory Visit: Payer: Managed Care, Other (non HMO)

## 2016-05-07 ENCOUNTER — Other Ambulatory Visit: Payer: Self-pay | Admitting: Urgent Care

## 2016-05-08 ENCOUNTER — Encounter: Payer: Self-pay | Admitting: Urgent Care

## 2016-05-08 NOTE — Telephone Encounter (Signed)
Last OV was 12/2015. BP is well controlled. Will fill for an additional 6 months. Due for recheck at that time.  BP Readings from Last 3 Encounters:  03/04/16 132/84  12/29/15 132/74  07/28/15 122/70

## 2016-05-09 ENCOUNTER — Telehealth: Payer: Self-pay

## 2016-05-09 MED ORDER — ESOMEPRAZOLE MAGNESIUM 40 MG PO CPDR: 40.0000 mg | DELAYED_RELEASE_CAPSULE | Freq: Every day | ORAL | 5 refills | Status: DC

## 2016-05-09 NOTE — Telephone Encounter (Signed)
Refill provided for Nexium.

## 2016-05-09 NOTE — Addendum Note (Signed)
Addended by: Wallis BambergMANI, Bairon Klemann on: 05/09/2016 12:37 PM   Modules accepted: Orders

## 2016-05-09 NOTE — Telephone Encounter (Signed)
Please notify patient.

## 2016-05-09 NOTE — Telephone Encounter (Signed)
Pt advised.

## 2016-05-09 NOTE — Telephone Encounter (Signed)
[facility], Omeprazole no longer covered by ins. Alternative req Protonix, Nexium or Prevacid

## 2016-05-13 ENCOUNTER — Encounter: Payer: Self-pay | Admitting: Urgent Care

## 2016-05-13 ENCOUNTER — Other Ambulatory Visit: Payer: Self-pay | Admitting: Urgent Care

## 2016-05-20 ENCOUNTER — Ambulatory Visit (INDEPENDENT_AMBULATORY_CARE_PROVIDER_SITE_OTHER): Payer: Managed Care, Other (non HMO) | Admitting: Urgent Care

## 2016-05-20 VITALS — BP 132/81 | HR 76 | Temp 97.9°F | Resp 18 | Ht 70.0 in | Wt 234.6 lb

## 2016-05-20 DIAGNOSIS — F17200 Nicotine dependence, unspecified, uncomplicated: Secondary | ICD-10-CM

## 2016-05-20 DIAGNOSIS — F419 Anxiety disorder, unspecified: Secondary | ICD-10-CM

## 2016-05-20 DIAGNOSIS — E119 Type 2 diabetes mellitus without complications: Secondary | ICD-10-CM

## 2016-05-20 DIAGNOSIS — F418 Other specified anxiety disorders: Secondary | ICD-10-CM | POA: Diagnosis not present

## 2016-05-20 DIAGNOSIS — F32A Depression, unspecified: Secondary | ICD-10-CM

## 2016-05-20 DIAGNOSIS — E039 Hypothyroidism, unspecified: Secondary | ICD-10-CM

## 2016-05-20 DIAGNOSIS — E782 Mixed hyperlipidemia: Secondary | ICD-10-CM

## 2016-05-20 DIAGNOSIS — E669 Obesity, unspecified: Secondary | ICD-10-CM

## 2016-05-20 DIAGNOSIS — R0683 Snoring: Secondary | ICD-10-CM | POA: Diagnosis not present

## 2016-05-20 DIAGNOSIS — F329 Major depressive disorder, single episode, unspecified: Secondary | ICD-10-CM

## 2016-05-20 MED ORDER — DAPAGLIFLOZIN PRO-METFORMIN ER 5-1000 MG PO TB24: 2.0000 | ORAL_TABLET | Freq: Every day | ORAL | 1 refills | Status: DC

## 2016-05-20 MED ORDER — ATORVASTATIN CALCIUM 20 MG PO TABS: ORAL_TABLET | ORAL | 1 refills | Status: DC

## 2016-05-20 MED ORDER — CYCLOBENZAPRINE HCL 10 MG PO TABS: 5.0000 mg | ORAL_TABLET | Freq: Every day | ORAL | 5 refills | Status: DC

## 2016-05-20 MED ORDER — DULOXETINE HCL 60 MG PO CPEP: ORAL_CAPSULE | ORAL | 1 refills | Status: DC

## 2016-05-20 MED ORDER — LEVOTHYROXINE SODIUM 150 MCG PO TABS: 150.0000 ug | ORAL_TABLET | Freq: Every day | ORAL | 3 refills | Status: DC

## 2016-05-20 MED ORDER — NICOTINE 21 MG/24HR TD PT24: 21.0000 mg | MEDICATED_PATCH | Freq: Every day | TRANSDERMAL | 1 refills | Status: DC

## 2016-05-20 MED ORDER — LISINOPRIL 5 MG PO TABS: 5.0000 mg | ORAL_TABLET | Freq: Every day | ORAL | 1 refills | Status: DC

## 2016-05-20 MED ORDER — ARIPIPRAZOLE 15 MG PO TABS: 15.0000 mg | ORAL_TABLET | Freq: Every day | ORAL | 0 refills | Status: DC

## 2016-05-20 NOTE — Progress Notes (Signed)
MRN: [id number]  Subjective:   Robert Wise is a 56 y.o. male who presents for follow up and medication refills.   DM - Patient is currently managed with Xigduo. Denies adverse effects including metallic taste, hypoglycemia, nausea, vomiting. Patient is checking home blood sugars. Home blood sugar is generally 100-150's. Patient denies blurred vision, polydipsia, chest pain, nausea, vomiting, abdominal pain, hematuria, polyuria, skin infections, numbness or tingling. Patient is checking their feet daily. Denies foot concerns. Last diabetic eye exam eye exam was 03/2016, has a new prescription. Diet is generally healthy, has made good changes. Patient is not exercising but stays very active with work.  Known diabetic complications: none. Immunizations: Flu vaccine 12/2015, pneumococal vaccine 08/01/2014, tdap 08/01/2014. Smokes 1.5ppd, is interested in quitting. He previously quit for 3 years, used patches successfully.  Thyroid - Managed with levothyroxine. Has been really steady on this dose.   GERD - He does not know what medication he takes currently. It was switched to a different medication due to his insurance coverage. He will look into what it was switched to and let me know.  Depression and Anxiety - Managed well with Cymbalta, Abilify. His major stressor has always been work. This has dramatically improved. Denies having crying spells, feeling depressed. He does get overwhelmed at times and worries easily but feels overall improved. Denies SI, HI.  HL - Managed with atorvastatin, Omega-3 fatty acid supplement. Diet and exercise as above. Denies myalgia.  Sleep study - Reports having episodes of snoring, day time fatigue. Occasionally wakes up gasping for air but thought this was anxiety until he was recommended to have a sleep study.  Objective:   PHYSICAL EXAM BP 132/81   Pulse 76   Temp 97.9 F (36.6 C) (Oral)   Resp 18   Ht 5\' 10"  (1.778 m)   Wt 234 lb 9.6 oz (106.4  kg)   SpO2 96%   BMI 33.66 kg/m   Physical Exam  Constitutional: He is oriented to person, place, and time. He appears well-developed and well-nourished.  HENT:  Mouth/Throat: Oropharynx is clear and moist.  Eyes: No scleral icterus.  Neck: Normal range of motion. Neck supple. No thyromegaly present.  Cardiovascular: Normal rate, regular rhythm and intact distal pulses.  Exam reveals no gallop and no friction rub.   No murmur heard. Pulmonary/Chest: No respiratory distress. He has no wheezes. He has no rales.  Abdominal: Soft. Bowel sounds are normal. He exhibits no distension and no mass. There is no tenderness. There is no guarding.  Neurological: He is alert and oriented to person, place, and time.  Skin: Skin is warm and dry.   Assessment and Plan :   1. Type 2 diabetes mellitus without complication, without long-term current use of insulin (HCC) - Last a1c was 03/2016, was at 7.7%. Continue current regimen, healthy diet and physical activity. Recheck in 3 months. Continue lisinopril for renal protection.  2. Anxiety and depression - Well controlled, continue Cymbalta, Abilify. Recheck in 6 months or sooner if necessary.  3. Tobacco use disorder - Start nicotine patches. Recheck in 6 weeks. Okay to refill for 14mg  patches for 2 weeks if patient calls then.  4. Mixed hyperlipidemia - Continue current regimen, lipid panel pending.  5. Hypothyroidism, unspecified type - Well controlled, continue levothyroxine.  6. Obesity, unspecified classification, unspecified obesity type, unspecified whether serious comorbidity present 7. Snoring - Ambulatory referral to Sleep Studies   Wallis Bamberg, PA-C Primary [facility] at [facility] [phone] 05/20/2016 11:16 AM

## 2016-05-20 NOTE — Patient Instructions (Signed)
Diabetes Mellitus and Food It is important for you to manage your blood sugar (glucose) level. Your blood glucose level can be greatly affected by what you eat. Eating healthier foods in the appropriate amounts throughout the day at about the same time each day will help you control your blood glucose level. It can also help slow or prevent worsening of your diabetes mellitus. Healthy eating may even help you improve the level of your blood pressure and reach or maintain a healthy weight. General recommendations for healthful eating and cooking habits include:  Eating meals and snacks regularly. Avoid going long periods of time without eating to lose weight.  Eating a diet that consists mainly of plant-based foods, such as fruits, vegetables, nuts, legumes, and whole grains.  Using low-heat cooking methods, such as baking, instead of high-heat cooking methods, such as deep frying. Work with your dietitian to make sure you understand how to use the Nutrition Facts information on food labels. How can food affect me? Carbohydrates  Carbohydrates affect your blood glucose level more than any other type of food. Your dietitian will help you determine how many carbohydrates to eat at each meal and teach you how to count carbohydrates. Counting carbohydrates is important to keep your blood glucose at a healthy level, especially if you are using insulin or taking certain medicines for diabetes mellitus. Alcohol  Alcohol can cause sudden decreases in blood glucose (hypoglycemia), especially if you use insulin or take certain medicines for diabetes mellitus. Hypoglycemia can be a life-threatening condition. Symptoms of hypoglycemia (sleepiness, dizziness, and disorientation) are similar to symptoms of having too much alcohol. If your health care provider has given you approval to drink alcohol, do so in moderation and use the following guidelines:  Women should not have more than one drink per day, and men  should not have more than two drinks per day. One drink is equal to:  12 oz of beer.  5 oz of wine.  1 oz of hard liquor.  Do not drink on an empty stomach.  Keep yourself hydrated. Have water, diet soda, or unsweetened iced tea.  Regular soda, juice, and other mixers might contain a lot of carbohydrates and should be counted. What foods are not recommended? As you make food choices, it is important to remember that all foods are not the same. Some foods have fewer nutrients per serving than other foods, even though they might have the same number of calories or carbohydrates. It is difficult to get your body what it needs when you eat foods with fewer nutrients. Examples of foods that you should avoid that are high in calories and carbohydrates but low in nutrients include:  Trans fats (most processed foods list trans fats on the Nutrition Facts label).  Regular soda.  Juice.  Candy.  Sweets, such as cake, pie, doughnuts, and cookies.  Fried foods. What foods can I eat? Eat nutrient-rich foods, which will nourish your body and keep you healthy. The food you should eat also will depend on several factors, including:  The calories you need.  The medicines you take.  Your weight.  Your blood glucose level.  Your blood pressure level.  Your cholesterol level. You should eat a variety of foods, including:  Protein.  Lean cuts of meat.  Proteins low in saturated fats, such as fish, egg whites, and beans. Avoid processed meats.  Fruits and vegetables.  Fruits and vegetables that may help control blood glucose levels, such as apples, mangoes, and   yams.  Dairy products.  Choose fat-free or low-fat dairy products, such as milk, yogurt, and cheese.  Grains, bread, pasta, and rice.  Choose whole grain products, such as multigrain bread, whole oats, and brown rice. These foods may help control blood pressure.  Fats.  Foods containing healthful fats, such as nuts,  avocado, olive oil, canola oil, and fish. Does everyone with diabetes mellitus have the same meal plan? Because every person with diabetes mellitus is different, there is not one meal plan that works for everyone. It is very important that you meet with a dietitian who will help you create a meal plan that is just right for you. This information is not intended to replace advice given to you by your health care provider. Make sure you discuss any questions you have with your health care provider. Document Released: 12/13/2004 Document Revised: 08/24/2015 Document Reviewed: 02/12/2013 [employer] Interactive Patient Education  2017 [employer]     Tobacco Use Disorder Tobacco use disorder (TUD) is a mental disorder. It is the long-term use of tobacco in spite of related health problems or difficulty with normal life activities. Tobacco is most commonly smoked as cigarettes and less commonly as cigars or pipes. Smokeless chewing tobacco and snuff are also popular. People with TUD get a feeling of extreme pleasure (euphoria) from using tobacco and have a desire to use it again and again. Repeated use of tobacco can cause problems. The addictive effects of tobacco are due mainly tothe ingredient nicotine. Nicotine also causes a rush of adrenaline (epinephrine) in the body. This leads to increased blood pressure, heart rate, and breathing rate. These changes may cause problems for people with high blood pressure, weak hearts, or lung disease. High doses of nicotine in children and pets can lead to seizures and death. Tobacco contains a number of other unsafe chemicals. These chemicals are especially harmful when inhaled as smoke and can damage almost every organ in the body. Smokers live shorter lives than nonsmokers and are at risk of dying from a number of diseases and cancers. Tobacco smoke can also cause health problems for nonsmokers (due to inhaling secondhand smoke). Smoking is also a fire  hazard. TUD usually starts in the late teenage years and is most common in young adults between the ages of 11 and 25 years. People who start smoking earlier in life are more likely to continue smoking as adults. TUD is somewhat more common in men than women. People with TUD are at higher risk for using alcohol and other drugs of abuse. What increases the risk? Risk factors for TUD include:  Having family members with the disorder.  Being around people who use tobacco.  Having an existing mental health issue such as schizophrenia, depression, bipolar disorder, ADHD, or posttraumatic stress disorder (PTSD). What are the signs or symptoms? People with tobacco use disorder have two or more of the following signs and symptoms within 12 months:  Use of more tobacco over a longer period than intended.  Not able to cut down or control tobacco use.  A lot of time spent obtaining or using tobacco.  Strong desire or urge to use tobacco (craving). Cravings may last for 6 months or longer after quitting.  Use of tobacco even when use leads to major problems at work, school, or home.  Use of tobacco even when use leads to relationship problems.  Giving up or cutting down on important life activities because of tobacco use.  Repeatedly using tobacco in situations  where it puts you or others in physical danger, like smoking in bed.  Use of tobacco even when it is known that a physical or mental problem is likely related to tobacco use.  Physical problems are numerous and may include chronic bronchitis, emphysema, lung and other cancers, gum disease, high blood pressure, heart disease, and stroke.  Mental problems caused by tobacco may include difficulty sleeping and anxiety.  Need to use greater amounts of tobacco to get the same effect. This means you have developed a tolerance.  Withdrawal symptoms as a result of stopping or rapidly cutting back use. These symptoms may last a month or more  after quitting and include the following:  Depressed, anxious, or irritable mood.  Difficulty concentrating.  Increased appetite.  Restlessness or trouble sleeping.  Use of tobacco to avoid withdrawal symptoms. How is this diagnosed? Tobacco use disorder is diagnosed by your health care provider. A diagnosis may be made by:  Your health care provider asking questions about your tobacco use and any problems it may be causing.  A physical exam.  Lab tests.  You may be referred to a mental health professional or addiction specialist. The severity of tobacco use disorder depends on the number of signs and symptoms you have:  Mild-Two or three symptoms.  Moderate-Four or five symptoms.  Severe-Six or more symptoms. How is this treated? Many people with tobacco use disorder are unable to quit on their own and need help. Treatment options include the following:  Nicotine replacement therapy (NRT). NRT provides nicotine without the other harmful chemicals in tobacco. NRT gradually lowers the dosage of nicotine in the body and reduces withdrawal symptoms. NRT is available in over-the-counter forms (gum, lozenges, and skin patches) as well as prescription forms (mouth inhaler and nasal spray).  Medicines.This may include:  Antidepressant medicine that may reduce nicotine cravings.  A medicine that acts on nicotine receptors in the brain to reduce cravings and withdrawal symptoms. It may also block the effects of tobacco in people with TUD who relapse.  Counseling or talk therapy. A form of talk therapy called behavioral therapy is commonly used to treat people with TUD. Behavioral therapy looks at triggers for tobacco use, how to avoid them, and how to cope with cravings. It is most effective in person or by phone but is also available in self-help forms (books and Internet websites).  Support groups. These provide emotional support, advice, and guidance for quitting tobacco. The  most effective treatment for TUD is usually a combination of medicine, talk therapy, and support groups. Follow these instructions at home:  Keep all follow-up visits as directed by your health care provider. This is important.  Take medicines only as directed by your health care provider.  Check with your health care provider before starting new prescription or over-the-counter medicines. Contact a health care provider if:  You are not able to take your medicines as prescribed.  Treatment is not helping your TUD and your symptoms get worse. Get help right away if:  You have serious thoughts about hurting yourself or others.  You have trouble breathing, chest pain, sudden weakness, or sudden numbness in part of your body. This information is not intended to replace advice given to you by your health care provider. Make sure you discuss any questions you have with your health care provider. Document Released: 11/22/2003 Document Revised: 11/19/2015 Document Reviewed: 05/14/2013 [employer] Interactive Patient Education  2017 [employer]

## 2016-05-21 LAB — LIPID PANEL
Chol/HDL Ratio: 5.3 ratio units — ABNORMAL HIGH (ref 0.0–5.0)
Cholesterol, Total: 153 mg/dL (ref 100–199)
HDL: 29 mg/dL — ABNORMAL LOW (ref >39)
LDL Calculated: 92 mg/dL (ref 0–99)
Triglycerides: 158 mg/dL — ABNORMAL HIGH (ref 0–149)
VLDL Cholesterol Cal: 32 mg/dL (ref 5–40)

## 2016-05-21 LAB — THYROID PANEL WITH TSH
Free Thyroxine Index: 2.5 (ref 1.2–4.9)
T3 Uptake Ratio: 27 % (ref 24–39)
T4, Total: 9.3 ug/dL (ref 4.5–12.0)
TSH: 1.12 u[IU]/mL (ref 0.450–4.500)

## 2016-05-21 LAB — COMPREHENSIVE METABOLIC PANEL
ALT: 21 IU/L (ref 0–44)
AST: 14 IU/L (ref 0–40)
Albumin/Globulin Ratio: 1.8 (ref 1.2–2.2)
Albumin: 4.9 g/dL (ref 3.5–5.5)
Alkaline Phosphatase: 77 IU/L (ref 39–117)
BUN/Creatinine Ratio: 20 (ref 9–20)
BUN: 13 mg/dL (ref 6–24)
Bilirubin Total: 0.3 mg/dL (ref 0.0–1.2)
CO2: 21 mmol/L (ref 18–29)
Calcium: 9.9 mg/dL (ref 8.7–10.2)
Chloride: 99 mmol/L (ref 96–106)
Creatinine, Ser: 0.66 mg/dL — ABNORMAL LOW (ref 0.76–1.27)
GFR calc Af Amer: 126 mL/min/{1.73_m2} (ref >59)
GFR calc non Af Amer: 109 mL/min/{1.73_m2} (ref >59)
Globulin, Total: 2.7 g/dL (ref 1.5–4.5)
Glucose: 135 mg/dL — ABNORMAL HIGH (ref 65–99)
Potassium: 4.7 mmol/L (ref 3.5–5.2)
Sodium: 141 mmol/L (ref 134–144)
Total Protein: 7.6 g/dL (ref 6.0–8.5)

## 2016-06-03 ENCOUNTER — Other Ambulatory Visit: Payer: Self-pay | Admitting: Urgent Care

## 2016-06-10 ENCOUNTER — Institutional Professional Consult (permissible substitution): Payer: Managed Care, Other (non HMO) | Admitting: Neurology

## 2016-06-21 ENCOUNTER — Encounter: Payer: Self-pay | Admitting: Neurology

## 2016-06-27 ENCOUNTER — Institutional Professional Consult (permissible substitution): Payer: Managed Care, Other (non HMO) | Admitting: Neurology

## 2016-07-01 ENCOUNTER — Institutional Professional Consult (permissible substitution): Payer: Managed Care, Other (non HMO) | Admitting: Neurology

## 2016-07-10 ENCOUNTER — Other Ambulatory Visit: Payer: Self-pay | Admitting: Urgent Care

## 2016-07-22 ENCOUNTER — Ambulatory Visit (INDEPENDENT_AMBULATORY_CARE_PROVIDER_SITE_OTHER): Payer: Managed Care, Other (non HMO) | Admitting: Neurology

## 2016-07-22 ENCOUNTER — Encounter: Payer: Self-pay | Admitting: Neurology

## 2016-07-22 VITALS — BP 133/76 | HR 72 | Ht 71.0 in | Wt 237.0 lb

## 2016-07-22 DIAGNOSIS — R351 Nocturia: Secondary | ICD-10-CM

## 2016-07-22 DIAGNOSIS — R400 Somnolence: Secondary | ICD-10-CM

## 2016-07-22 DIAGNOSIS — G4733 Obstructive sleep apnea (adult) (pediatric): Secondary | ICD-10-CM

## 2016-07-22 DIAGNOSIS — G4761 Periodic limb movement disorder: Secondary | ICD-10-CM | POA: Diagnosis not present

## 2016-07-22 DIAGNOSIS — Z789 Other specified health status: Secondary | ICD-10-CM

## 2016-07-22 DIAGNOSIS — G2581 Restless legs syndrome: Secondary | ICD-10-CM | POA: Diagnosis not present

## 2016-07-22 DIAGNOSIS — E669 Obesity, unspecified: Secondary | ICD-10-CM | POA: Diagnosis not present

## 2016-07-22 NOTE — Progress Notes (Signed)
Subjective:    Patient ID: Robert Wise is a 56 y.o. male.  HPI     Robert Age, MD, PhD [facility] [address] [place], [place] [address]  Dear Robert Wise,  I saw your patient, Robert Wise, upon your kind request in my neurologic clinic today for initial consultation of his sleep disorder, reevaluation of his OSA. The patient is accompanied by his wife today. As you know, Robert Wise is a 56 year old right-handed gentleman with an underlying medical history of diabetes, thyroid disease, reflux disease, depression, anxiety, hyperlipidemia, chronic back pain, and obesity, who was diagnosed with obstructive sleep apnea several years ago. I met him over 3 years ago in September 2014, at which time he needed reevaluation of his OSA, prior test results and CPAP compliance data were not available at the time. He also reported RLS symptoms at the time. I suggested he return for sleep study testing but he declined at the time. He did not come back for follow-up. I reviewed your office note from 05/20/2016. I also reviewed portions of her sleep study report from 07/14/2002. He was noted to have very loud snoring with severe oxygen desaturation. His RDI was 93 per hour. He was titrated on CPAP of 15 cm. He reports not being able to tolerate CPAP, air leaks, does not get comfortable. His Epworth sleepiness score is 16 out of 24, fatigue score is 47 note of 63. He lives with his wife and one of his 2 children. He smokes 1-1/2 packs per day, quit drinking alcohol in 2014, reports drinking around a bit of caffeine in the form of coffee, 1 "pot per day". He believes he is on 15 cm of pressure, using a nasal mask, tried a fullface mask but that was more uncomfortable. He tries to nap on weekends when he can. When he tries to nap he sleeps usually a couple of hours. Of note, he takes several medications for his depression. He has been on BuSpar for anxiety and  Cymbalta once daily, has been on Abilify 15 mg daily. Bedtime is between 8 and 9 and he falls asleep quickly. Wake up time usually around 5 AM. He gets up to go to the bathroom once per hour tonight. He denies morning headaches but does endorse intermittent restless leg symptoms and his wife endorses that he has a history of leg twitching and kicking during sleep, not so much lately. He works as a Administrator. He has 2 sons, ages 70 and 26.   Previously:   12/15/2012: Robert Wise is a very pleasant 56 year old right-handed gentleman with an underlying medical history of type 2 diabetes and chronic back pain, who is a commercial truck driver and needs re-evaluation for obstructive sleep apnea. He has been taking Norco for his chronic back pain and he recently counseled him on reducing his narcotic pain medication. He was diagnosed with severe OSA over 10 years ago and has been using a CPAP. He did not bring his machine and I do not have prior sleep study results. He does not know where he had the study done and does not recall what pressure he is on, he uses a nasal pillows mask, medium, he believes. He has mild RLS Sx and was started on Requip, but takes it very infrequently, as he does not have Sx every night. His wife has reported leg kicking in his sleep. He goes to bed around 10 PM and falls asleep within 5 minutes.  He has to wake up at 4 AM. He still does not feel very rested, despite being compliant with CPAP. When he first started using CPAP many years, he felt improved. He has EDS and his ESS is 13/24 today. He drives a tanker truck. He has not fallen asleep while driving. He smokes 1 1/2 ppd. He quit EtOH on 08/14/11. He has a Hx of heavy drinking before. His sister has OSA and has a CPAP. He denies morning HAs. He wakes up on an average 3 times/night and has to go to the bathroom each time.  He is a restless sleeper and in the morning, the bed is quite disheveled.   He denies cataplexy, sleep  paralysis, hypnagogic or hypnopompic hallucinations, or sleep attacks. He does not report any vivid dreams, nightmares, dream enactments, or parasomnias, such as sleep talking or sleep walking. The patient has not had a sleep study or a home sleep test in over 10 years.  He consumes 6 to 8 caffeinated beverages per day, usually in the form of coffee, and energy sodas.  His bedroom is usually dark and cool. There is a TV in the bedroom and usually it is not on at night.  He has lost wt in comparison to when he was first diagnosed with OSA.   His Past Medical History Is Significant For: Past Medical History:  Diagnosis Date  . Anxiety   . Depression   . Diabetes mellitus without complication ([place])   . Hyperlipidemia   . OSA (obstructive sleep apnea) 12/15/2012    His Past Surgical History Is Significant For: Past Surgical History:  Procedure Laterality Date  . ANKLE SURGERY Left 2006  . VASECTOMY      His Family History Is Significant For: No family history on file.  His Social History Is Significant For: Social History   Social History  . Marital status: Married    Spouse name: [name]  . Number of children: 3  . Years of education: 11.5   Occupational History  .  [employer]    Driver   Social History Main Topics  . Smoking status: Current Every Day Smoker    Packs/day: 1.50    Years: 37.00    Types: Cigarettes  . Smokeless tobacco: Never Used  . Alcohol use No     Comment: quit: 08/14/2011  . Drug use: No  . Sexual activity: Yes    Birth control/ protection: None   Other Topics Concern  . None   Social History Narrative   Patient lives at home with family.    Caffeine Use: 1 pot of coffee daily    His Allergies Are:  Allergies  Allergen Reactions  . Tricor [Fenofibrate]   . Zocor [Simvastatin]   :   His Current Medications Are:  Outpatient Encounter Prescriptions as of 07/22/2016  Medication Sig  . ARIPiprazole (ABILIFY) 15 MG tablet Take 1 tablet (15  mg total) by mouth daily.  Marland Kitchen atorvastatin (LIPITOR) 20 MG tablet TAKE 1 TABLET (20 MG TOTAL) BY MOUTH DAILY.  Marland Kitchen Blood Glucose Monitoring Suppl (ONE TOUCH ULTRA SYSTEM KIT) W/DEVICE KIT 1 kit by Does not apply route once.  . busPIRone (BUSPAR) 7.5 MG tablet Take 1 tablet (7.5 mg total) by mouth 2 (two) times daily.  . cyclobenzaprine (FLEXERIL) 10 MG tablet Take 0.5-1 tablets (5-10 mg total) by mouth at bedtime.  . Dapagliflozin-Metformin HCl ER (XIGDUO XR) 07-998 MG TB24 Take 2 tablets by mouth daily.  . DULoxetine (CYMBALTA) 60  MG capsule TAKE 1 CAPSULE (60 MG TOTAL) BY MOUTH DAILY.  Marland Kitchen esomeprazole (NEXIUM) 40 MG capsule Take 1 capsule (40 mg total) by mouth daily.  . fish oil-omega-3 fatty acids 1000 MG capsule Take 2 g by mouth daily.  Marland Kitchen glucose blood (ONE TOUCH ULTRA TEST) test strip 1 each by Other route daily.  . Lancets (ONETOUCH ULTRASOFT) lancets USE AS INSTRUCTED  . levothyroxine (SYNTHROID, LEVOTHROID) 150 MCG tablet Take 1 tablet (150 mcg total) by mouth daily before breakfast.  . lisinopril (PRINIVIL,ZESTRIL) 5 MG tablet Take 1 tablet (5 mg total) by mouth daily.  Marland Kitchen lisinopril (PRINIVIL,ZESTRIL) 5 MG tablet TAKE 1 TABLET (5 MG TOTAL) BY MOUTH DAILY.  . nicotine (NICODERM CQ) 21 mg/24hr patch Place 1 patch (21 mg total) onto the skin daily.   No facility-administered encounter medications on file as of 07/22/2016.   :  Review of Systems:  Out of a complete 14 point review of systems, all are reviewed and negative with the exception of these symptoms as listed below:  Review of Systems  Neurological:       Pt presents today to discuss his sleep. Pt has had a sleep study before and has been unable to tolerate cpap.  Epworth Sleepiness Scale 0= would never doze 1= slight chance of dozing 2= moderate chance of dozing 3= high chance of dozing  Sitting and reading: 2 Watching TV: 3 Sitting inactive in a public place (ex. Theater or meeting): 2 As a passenger in a car for an  hour without a break: 3 Lying down to rest in the afternoon: 3 Sitting and talking to someone:0 Sitting quietly after lunch (no alcohol): 3 In a car, while stopped in traffic: 0 Total: 16      Objective:  Neurologic Exam  Physical Exam Physical Examination:   Vitals:   07/22/16 1053  BP: 133/76  Pulse: 72    General Examination: The patient is a very pleasant 56 y.o. male in no acute distress. He appears well-developed and well-nourished and well groomed.   HEENT: Normocephalic, atraumatic, pupils are equal, round and reactive to light and accommodation. Funduscopic exam is normal with sharp disc margins noted. Extraocular tracking is good without limitation to gaze excursion or nystagmus noted.he has very mild facial masking and decrease in eye blink rate, slower eye blink. Minimal nuchal rigidity is noted. Speech is normal to perhaps mildly hypophonic, no lip, neck or jaw tremor. Airway examination reveals moderate airway crowding with thicker soft palate and large uvula, mild pharyngeal erythema is noted. Tonsils in place, not enlarged. Neck circumference is 18 inches. Tongue protrudes centrally and palate elevates symmetrically. Hearing is intact.  Chest: Clear to auscultation without wheezing, rhonchi or crackles noted.  Heart: S1+S2+0, regular and normal without murmurs, rubs or gallops noted.   Abdomen: Soft, non-tender and non-distended with normal bowel sounds appreciated on auscultation.  Extremities: There is no obvious abn.   Skin: Warm and dry without trophic changes noted.  Musculoskeletal: exam reveals no obvious joint deformities, tenderness or joint swelling or erythema.   Neurologically:  Mental status: The patient is awake, alert and oriented in all 4 spheres. His immediate and remote memory, attention, language skills and fund of knowledge are appropriate. There is no evidence of aphasia, agnosia, apraxia or anomia. Speech is clear with normal prosody and  enunciation. Thought process is linear. Mood is normal and affect is normal.  Cranial nerves II - XII are as described above under HEENT exam. In addition:  shoulder shrug is normal with equal shoulder height noted. Motor exam: Normal bulk, strength and tone is noted. There is no drift, tremor or rebound. Romberg is negative. Reflexes are 2+ throughout. Fine motor skills and coordination: intact with normal finger taps, normal hand movements, normal rapid alternating patting, normal foot taps and normal foot agility.  Cerebellar testing: No dysmetria or intention tremor. There is no truncal or gait ataxia.  Sensory exam: intact to light touch in the upper and lower extremities.  Gait, station and balance: He stands easily. No veering to one side is noted. No leaning to one side is noted. Posture is Wise-appropriate and stance is narrow based. Gait shows normal stride length and normal pace. No problems turning are noted. Tandem walk is unremarkable.   Assessment and plan:  In summary, Robert Wise is a very pleasant 56 y.o.-year old male with an underlying medical history of diabetes, thyroid disease, reflux disease, depression, anxiety, hyperlipidemia, chronic back pain, and obesity, who presents for reevaluation of his obstructive sleep apnea. He was diagnosed over 14 years ago. He has been struggling with his CPAP machine and the mask. He would benefit from reevaluation. He also endorses a history of restless leg syndrome type symptoms and also intermittent leg twitching or reported from his wife. He has a family history of OSA in his brother and sister who both have CPAP machines. Of note, he has developed very mild parkinsonian features since I saw him over 3 years ago. Of note, he has been on Abilify and may be developing mild drug-induced parkinsonism. He is encouraged to discuss this with you next time. I suggested we proceed with sleep study testing for reestablishing his OSA diagnosis and help  with CPAP tolerance and management, helped with mask tolerance. I had a long chat with the patient and his about my findings and the diagnosis of OSA, its prognosis and treatment options. We talked about medical treatments, surgical interventions and non-pharmacological approaches. I explained in particular the risks and ramifications of untreated moderate to severe OSA, especially with respect to developing cardiovascular disease down the Road, including congestive heart failure, difficult to treat hypertension, cardiac arrhythmias, or stroke. Even type 2 diabetes has, in part, been linked to untreated OSA. Symptoms of untreated OSA include daytime sleepiness, memory problems, mood irritability and mood disorder such as depression and anxiety, lack of energy, as well as recurrent headaches, especially morning headaches. We talked about smoking cessation and trying to maintain a healthy lifestyle in general, as well as the importance of weight control. I encouraged the patient to eat healthy, exercise daily and keep well hydrated, to keep a scheduled bedtime and wake time routine, to not skip any meals and eat healthy snacks in between meals. I advised the patient not to drive when feeling sleepy. I recommended the following at this time: sleep study with potential positive airway pressure titration. (We will score hypopneas at 4%).   I explained the sleep test procedure to the patient and also outlined possible surgical and non-surgical treatment options of OSA, including the use of a custom-made dental device (which would require a referral to a specialist dentist or oral surgeon), upper airway surgical options, such as pillar implants, radiofrequency surgery, tongue base surgery, and UPPP (which would involve a referral to an ENT surgeon). Rarely, jaw surgery such as mandibular advancement may be considered.  I also explained the CPAP treatment option to the patient, who indicated that he would be willing to  continue  to use CPAP if the need arises. I explained the importance of being compliant with PAP treatment, not only for insurance purposes but primarily to improve His symptoms, and for the patient's long term health benefit, including to reduce His cardiovascular risks. I answered all their questions today and the patient and his wife were in agreement. I would like to see him back after the sleep study is completed and encouraged him to call with any interim questions, concerns, problems or updates.   Thank you very much for allowing me to participate in the care of this nice patient. If I can be of any further assistance to you please do not hesitate to call me at [phone].  Sincerely,   Robert Age, MD, PhD

## 2016-07-22 NOTE — Patient Instructions (Signed)
Based on your symptoms and your exam I believe you are still at risk for obstructive sleep apnea or OSA, and I think we should proceed with a sleep study to determine how severe it is. If you have more than mild OSA, I want you to consider treatment with CPAP. Please remember, the risks and ramifications of moderate to severe obstructive sleep apnea or OSA are: Cardiovascular disease, including congestive heart failure, stroke, difficult to control hypertension, arrhythmias, and even type 2 diabetes has been linked to untreated OSA. Sleep apnea causes disruption of sleep and sleep deprivation in most cases, which, in turn, can cause recurrent headaches, problems with memory, mood, concentration, focus, and vigilance. Most people with untreated sleep apnea report excessive daytime sleepiness, which can affect their ability to drive. Please do not drive if you feel sleepy.   I will likely see you back after your sleep study to go over the test results and where to go from there. We will call you after your sleep study to advise about the results (most likely, you will hear from Lafonda Mosses or Belenda Cruise, my nurse) and to set up an appointment at the time, as necessary.    Our sleep lab administrative assistant, Alvis Lemmings will meet with you or call you to schedule your sleep study. If you don't hear back from her by next week please feel free to call her at [phone]. This is her direct line and please leave a message with your phone number to call back if you get the voicemail box. She will call back as soon as possible.

## 2016-08-08 ENCOUNTER — Telehealth: Payer: Self-pay | Admitting: Neurology

## 2016-08-08 NOTE — Telephone Encounter (Signed)
Pt's wife request sleep results °

## 2016-08-08 NOTE — Telephone Encounter (Signed)
Does not look like study has been scheduled, please advise patient status of appt.

## 2016-08-12 NOTE — Telephone Encounter (Signed)
I spoke with patient last week and we are waiting on authorization.

## 2016-08-18 ENCOUNTER — Other Ambulatory Visit: Payer: Self-pay | Admitting: Urgent Care

## 2016-08-18 DIAGNOSIS — F329 Major depressive disorder, single episode, unspecified: Secondary | ICD-10-CM

## 2016-08-18 DIAGNOSIS — F32A Depression, unspecified: Secondary | ICD-10-CM

## 2016-08-18 DIAGNOSIS — F419 Anxiety disorder, unspecified: Principal | ICD-10-CM

## 2016-08-21 ENCOUNTER — Ambulatory Visit (INDEPENDENT_AMBULATORY_CARE_PROVIDER_SITE_OTHER): Payer: Managed Care, Other (non HMO) | Admitting: Neurology

## 2016-08-21 DIAGNOSIS — G4720 Circadian rhythm sleep disorder, unspecified type: Secondary | ICD-10-CM

## 2016-08-21 DIAGNOSIS — G4733 Obstructive sleep apnea (adult) (pediatric): Secondary | ICD-10-CM

## 2016-08-23 NOTE — Procedures (Signed)
PATIENT'S NAME:  Robert Wise, Robert Wise DOB:      24-Nov-1960      MR#:    [id number]     DATE OF RECORDING: 08/21/2016 REFERRING M.D.:  Wallis Bamberg, PA-C Study Performed:  Split-Night Titration Study HISTORY: 56 year old man with a history of diabetes, thyroid disease, reflux disease, depression, anxiety, hyperlipidemia, chronic back pain, and obesity, who was diagnosed with obstructive sleep apnea several years ago and needs re-evaluation and treatment. The patient endorsed the Epworth Sleepiness Scale at 16 points. The patient's weight 238 pounds with a height of 71 (inches), resulting in a BMI of 33.3 kg/m2. The patient's neck circumference measured 18 inches.  CURRENT MEDICATIONS: Abilify, Lipitor, Buspar, Flexeril, Metformin, Cymbalta, Nexium, Levothyroxine, Lisinopril, Nicoderm   PROCEDURE:  This is a multichannel digital polysomnogram utilizing the Somnostar 11.2 system.  Electrodes and sensors were applied and monitored per AASM Specifications.   EEG, EOG, Chin and Limb EMG, were sampled at 200 Hz.  ECG, Snore and Nasal Pressure, Thermal Airflow, Respiratory Effort, CPAP Flow and Pressure, Oximetry was sampled at 50 Hz. Digital video and audio were recorded.      BASELINE STUDY WITHOUT CPAP RESULTS:  Lights Out was at 22:35 and Lights On at 04:59 for the night, split study start at 00:24 AM, epoch 226. Total recording time (TRT) was 108.5, with a total sleep time (TST) of 79 minutes.  The patient's sleep latency was 49 minutes. REM was absent. The sleep efficiency was 72.8 %.     SLEEP ARCHITECTURE: WASO (Wake after sleep onset) was 11.5 minutes, Stage N1 was 22 minutes, Stage N2 was 42.5 minutes, Stage N3 was 14.5 minutes and Stage R (REM sleep) was 0 minutes.  The percentages were Stage N1 27.8%, which is increased, Stage N2 53.8%, Stage N3 18.4% and Stage R (REM sleep) was absent.    The arousals were noted as: 10 were spontaneous, 0 were associated with PLMs, 77 were associated with respiratory  events.   Audio and video analysis did not show any abnormal or unusual movements, behaviors, phonations or vocalizations. The patient took 1 bathroom break for the night. Moderate to loud snoring was noted. The EKG was in keeping with normal sinus rhythm (NSR).   RESPIRATORY ANALYSIS:  There were a total of 120 respiratory events:  42 obstructive apneas, 2 central apneas and 32 mixed apneas with a total of 76 apneas and an apnea index (AI) of 57.7. There were 44 hypopneas with a hypopnea index of 33.4. The patient also had 0 respiratory event related arousals (RERAs).  Snoring was noted.     The total APNEA/HYPOPNEA INDEX (AHI) was 91.1 /hour and the total RESPIRATORY DISTURBANCE INDEX was 91.1 /hour.  0 events occurred in REM sleep and 122 events in NREM. The REM AHI was n/a, versus a non-REM AHI of 91.1 /hour. The patient spent 330 minutes sleep time in the supine position 0 minutes in non-supine. The supine AHI was 91.1 /hour versus a non-supine AHI of 0.0 /hour.  OXYGEN SATURATION & C02:  The wake baseline 02 saturation was 93%, with the lowest being 82%. Time spent below 89% saturation equaled 63 minutes.  PERIODIC LIMB MOVEMENTS: The patient had a total of 0 Periodic Limb Movements.  The Periodic Limb Movement (PLM) index was 0 /hour and the PLM Arousal index was 0 /hour.  TITRATION STUDY WITH CPAP RESULTS:   The patient was fitted with a medium N20 nasal mask. CPAP was initiated at 5 cmH20 with heated humidity per  AASM split night standards and pressure was advanced to 13 cmH20 because of hypopneas, apneas and desaturations. At a PAP pressure of 13 cmH20, there was a reduction of the AHI to 7.8 /hour with supine REM sleep achieved, O2 nadir of 88%.   Total recording time (TRT) was 276.5 minutes, with a total sleep time (TST) of 251 minutes. The patient's sleep latency was 19 minutes. REM latency was 129.5 minutes.  The sleep efficiency was 90.8 %.    SLEEP ARCHITECTURE: Wake after sleep  was 19.5 minutes, Stage N1 14 minutes, Stage N2 59.5 minutes, Stage N3 117.5 minutes and Stage R (REM sleep) 60 minutes. The percentages were: Stage N1 5.6%, Stage N2 23.7%, Stage N3 46.8% and Stage R (REM sleep) 23.9%.  The arousals were noted as: 26 were spontaneous, 0 were associated with PLMs, 11 were associated with respiratory events.  RESPIRATORY ANALYSIS:  There were a total of 28 respiratory events: 0 obstructive apneas, 3 central apneas and 0 mixed apneas with a total of 3 apneas and an apnea index (AI) of .7. There were 25 hypopneas with a hypopnea index of 6. /hour. The patient also had 3 respiratory event related arousals (RERAs).      The total APNEA/HYPOPNEA INDEX  (AHI) was 6.7 /hour and the total RESPIRATORY DISTURBANCE INDEX was 7.4 /hour.  2 events occurred in REM sleep and 26 events in NREM. The REM AHI was 2 /hour versus a non-REM AHI of 8.2 /hour. REM sleep was achieved on a pressure of  cm/h2o (AHI was  .) The patient spent 100% of total sleep time in the supine position. The supine AHI was 6.7 /hour, versus a non-supine AHI of 0.0/hour.  OXYGEN SATURATION & C02:  The wake baseline 02 saturation was 93%, with the lowest being 88%. Time spent below 89% saturation equaled 2 minutes.  PERIODIC LIMB MOVEMENTS: The patient had a total of 0 Periodic Limb Movements. The Periodic Limb Movement (PLM) index was 0 /hour and the PLM Arousal index was 0 /hour.   Post-study, the patient indicated that sleep was the same as usual.    POLYSOMNOGRAPHY IMPRESSION :   1. Obstructive Sleep Apnea (OSA)  2. Dysfunctions associated with sleep stages or arousals from sleep  RECOMMENDATIONS:  1. This patient has severe obstructive sleep apnea and responded well on CPAP therapy. Since the final pressure of 13 cm resulted in an AHI of 7.8/hour and O2 nadir of 88%, I will start the patient on home CPAP treatment at a pressure of 14 cm via medium nasal mask with heated humidity. The patient should be  reminded to be fully compliant with PAP therapy to improve sleep related symptoms and decrease long term cardiovascular risks. Please note that untreated obstructive sleep apnea carries additional perioperative morbidity. Patients with significant obstructive sleep apnea should receive perioperative PAP therapy and the surgeons and particularly the anesthesiologist should be informed of the diagnosis and the severity of the sleep disordered breathing. 2. This study shows sleep fragmentation and abnormal sleep stage percentages; these are nonspecific findings and per se do not signify an intrinsic sleep disorder or a cause for the patient's sleep-related symptoms. Causes include (but are not limited to) the first night effect of the sleep study, circadian rhythm disturbances, medication effect or an underlying mood disorder or medical problem.  3. The patient should be cautioned not to drive, work at heights, or operate dangerous or heavy equipment when tired or sleepy. Review and reiteration of good sleep hygiene  measures should be pursued with any patient. 4. The patient will be seen in follow-up by Dr. Frances Furbish at [facility] for discussion of the test results and further management strategies. The referring provider will be notified of the test results.  I certify that I have reviewed the entire raw data recording prior to the issuance of this report in accordance with the Standards of Accreditation of the American Academy of Sleep Medicine (AASM)  Huston Foley, MD, PhD Diplomat, American Board of Psychiatry and Neurology (Neurology and Sleep Medicine)

## 2016-08-23 NOTE — Addendum Note (Signed)
Addended by: Huston FoleyATHAR, Thelma Lorenzetti on: 08/23/2016 02:32 PM   Modules accepted: Orders

## 2016-08-23 NOTE — Progress Notes (Signed)
Diana:  Patient referred by Wallis BambergMario Mani, PA, seen by me on 07/22/16, split study on 08/21/16. Please call and notify patient that the recent sleep study confirmed the diagnosis of severe OSA. He did well with CPAP during the study with significant improvement of the respiratory events. Therefore, I would like start the patient on CPAP therapy at home by prescribing a machine for home use. I placed the order in the chart. The patient will need a follow up appointment with me in 10 weeks post set up that has to be scheduled; please go ahead and schedule while you have the patient on the phone and make sure patient understands the importance of keeping this window for the FU appointment, as it is often an insurance requirement and failing to adhere to this may result in losing coverage for sleep apnea treatment.  Please re-enforce the importance of compliance with treatment and the need for us to monitor compliance data - again an insurance requirement and good feedback for the patient as far as how they are doing.  Also remind patient, that any upcoming CPAP machine or mask issues, should be first addressed with the DME company. Please ask if patient has a preference regarding DME company.  Please arrange for CPAP set up at home through a DME company of patient's choice - once you have spoken to the patient - and faxed/routed report to PCP and referring MD (if other than PCP), you can close this encounter, thanks,   Huston FoleySaima Nikkie Liming, MD, PhD [facility] ([facility])

## 2016-08-27 ENCOUNTER — Telehealth: Payer: Self-pay

## 2016-08-27 NOTE — Telephone Encounter (Signed)
I spoke to patient and he is aware of results and recommendations. He is willing to start treatment. I will [facility] [place] per patient request. I will send copy of report to PCP. Patient was able to make f/u appt.

## 2016-08-27 NOTE — Telephone Encounter (Signed)
-----   Message from Huston FoleySaima Athar, MD sent at 08/23/2016  2:32 PM EDT ----- Robert Wise:  Patient referred by Wallis BambergMario Mani, PA, seen by me on 07/22/16, split study on 08/21/16. Please call and notify patient that the recent sleep study confirmed the diagnosis of severe OSA. He did well with CPAP during the study with significant improvement of the respiratory events. Therefore, I would like start the patient on CPAP therapy at home by prescribing a machine for home use. I placed the order in the chart. The patient will need a follow up appointment with me in 10 weeks post set up that has to be scheduled; please go ahead and schedule while you have the patient on the phone and make sure patient understands the importance of keeping this window for the FU appointment, as it is often an insurance requirement and failing to adhere to this may result in losing coverage for sleep apnea treatment.  Please re-enforce the importance of compliance with treatment and the need for us to monitor compliance data - again an insurance requirement and good feedback for the patient as far as how they are doing.  Also remind patient, that any upcoming CPAP machine or mask issues, should be first addressed with the DME company. Please ask if patient has a preference regarding DME company.  Please arrange for CPAP set up at home through a DME company of patient's choice - once you have spoken to the patient - and faxed/routed report to PCP and referring MD (if other than PCP), you can close this encounter, thanks,   Huston FoleySaima Athar, MD, PhD [facility] ([facility]

## 2016-08-30 ENCOUNTER — Other Ambulatory Visit: Payer: Self-pay | Admitting: Urgent Care

## 2016-09-08 ENCOUNTER — Other Ambulatory Visit: Payer: Self-pay | Admitting: Urgent Care

## 2016-09-10 ENCOUNTER — Encounter: Payer: Self-pay | Admitting: Urgent Care

## 2016-09-12 MED ORDER — BUSPIRONE HCL 7.5 MG PO TABS: 7.5000 mg | ORAL_TABLET | Freq: Two times a day (BID) | ORAL | 1 refills | Status: DC

## 2016-09-30 ENCOUNTER — Encounter: Payer: Self-pay | Admitting: Urgent Care

## 2016-10-24 ENCOUNTER — Telehealth: Payer: Self-pay | Admitting: Family Medicine

## 2016-10-24 NOTE — Telephone Encounter (Signed)
Pt wife calling stating that she need a copy of all the medicines that her husband use to take 10 years ago from his paper chart

## 2016-10-28 NOTE — Telephone Encounter (Signed)
Robert Wise can in medical records, they will have to pull his paper chart and get it together if we still have it.

## 2016-10-28 NOTE — Telephone Encounter (Signed)
Can you help me with this?

## 2016-10-31 NOTE — Telephone Encounter (Signed)
Called pt and left message for him to call back.  I have the chart pulled and on my desk whenever he is ready.

## 2016-11-02 ENCOUNTER — Other Ambulatory Visit: Payer: Self-pay | Admitting: Urgent Care

## 2016-11-28 ENCOUNTER — Encounter: Payer: Self-pay | Admitting: Neurology

## 2016-11-28 ENCOUNTER — Ambulatory Visit (INDEPENDENT_AMBULATORY_CARE_PROVIDER_SITE_OTHER): Payer: 59 | Admitting: Neurology

## 2016-11-28 VITALS — BP 150/78 | HR 80 | Ht 72.0 in | Wt 237.0 lb

## 2016-11-28 DIAGNOSIS — Z9989 Dependence on other enabling machines and devices: Secondary | ICD-10-CM | POA: Diagnosis not present

## 2016-11-28 DIAGNOSIS — G4733 Obstructive sleep apnea (adult) (pediatric): Secondary | ICD-10-CM | POA: Diagnosis not present

## 2016-11-28 NOTE — Patient Instructions (Addendum)
Please continue using your CPAP regularly. While your insurance requires that you use CPAP at least 4 hours each night on 70% of the nights, I recommend, that you not skip any nights and use it throughout the night if you can. Getting used to CPAP and staying with the treatment long term does take time and patience and discipline. Untreated obstructive sleep apnea when it is moderate to severe can have an adverse impact on cardiovascular health and raise her risk for heart disease, arrhythmias, hypertension, congestive heart failure, stroke and diabetes. Untreated obstructive sleep apnea causes sleep disruption, nonrestorative sleep, and sleep deprivation. This can have an impact on your day to day functioning and cause daytime sleepiness and impairment of cognitive function, memory loss, mood disturbance, and problems focussing. Using CPAP regularly can improve these symptoms.  Keep up the good work! We can see you in 6 months, I will prescribe a chin strap, you may need to eventually switch to another type of mask as your air leak from the mask is consistently high.

## 2016-11-28 NOTE — Progress Notes (Signed)
Subjective:    Patient ID: Robert Wise is a 56 y.o. male.  HPI     Interim history:   Robert Wise is a 57 year old right-handed gentleman with an underlying medical history of diabetes, thyroid disease, reflux disease, depression, anxiety, hyperlipidemia, chronic back pain, and obesity, who presents for follow-up consultation of his obstructive sleep apnea, after his recent split-night sleep study. The patient is unaccompanied today. I last saw him on 07/22/2016, at which time he presented again for reevaluation of his prior diagnosis of OSA. He had previously declined sleep study testing when I first met him in September 2014. I suggested we proceed with sleep study testing. He had a split-night sleep study on 08/21/2016. I went over his test results with him in detail today. Baseline sleep efficiency was 72.8%. Sleep latency was 49 minutes and REM sleep was absent. His total AHI was 91.9 per hour. Average oxygen saturation 93%, nadir was 82% during non-REM sleep. He had no significant PLMS. He was then fitted with a nasal mask. Sleep efficiency was 90.8% during the treatment portion of the study. He had 46.8% of slow-wave sleep and REM sleep was 23.9%. He was titrated from 5 cm to 13 cm. On the final pressure his AHI was 7.8 per hour with supine REM sleep achieved and O2 nadir of 88%. He had no significant PLMS. Based on his test results I prescribed CPAP therapy for home use at a pressure of 14 cm.  Today, 11/28/2016 (all dictated new, as well as above notes, some dictation done in note pad or Word, outside of chart, may appear as copied):  I reviewed his CPAP compliance data from 10/28/2016 through 11/26/2016 which is a total of 30 days, during which time he used his machine every night with percent used days greater than 4 hours at 100%, indicating superb compliance with an average usage of 6 hours and 37 minutes, residual AHI 4.8 per hour, leak high with the 95th percentile at 52.1 L/m on a  pressure of 14 cm with EPR of 1. He reports that he is feeling better since starting CPAP. He is indeed fully compliant, he has a tendency towards mouth breathing. He reports otherwise feeling more rested. He goes to [place] for mental health and sees [employer]. He is still on Abilify and also started sample of Rexulti, which has helped he feels.   The patient's allergies, current medications, family history, past medical history, past social history, past surgical history and problem list were reviewed and updated as appropriate.   Previously (copied from previous notes for reference):   07/22/2016: (He) was diagnosed with obstructive sleep apnea several years ago. I met him over 3 years ago in September 2014, at which time he needed reevaluation of his OSA, prior test results and CPAP compliance data were not available at the time. He also reported RLS symptoms at the time. I suggested he return for sleep study testing but he declined at the time. He did not come back for follow-up. I reviewed your office note from 05/20/2016. I also reviewed portions of her sleep study report from 07/14/2002. He was noted to have very loud snoring with severe oxygen desaturation. His RDI was 93 per hour. He was titrated on CPAP of 15 cm. He reports not being able to tolerate CPAP, air leaks, does not get comfortable. His Epworth sleepiness score is 16 out of 24, fatigue score is 47 note of 63. He lives with his wife and one of  his 2 children. He smokes 1-1/2 packs per day, quit drinking alcohol in 2014, reports drinking around a bit of caffeine in the form of coffee, 1 "pot per day". He believes he is on 15 cm of pressure, using a nasal mask, tried a fullface mask but that was more uncomfortable. He tries to nap on weekends when he can. When he tries to nap he sleeps usually a couple of hours. Of note, he takes several medications for his depression. He has been on BuSpar for anxiety and Cymbalta once daily, has  been on Abilify 15 mg daily. Bedtime is between 8 and 9 and he falls asleep quickly. Wake up time usually around 5 AM. He gets up to go to the bathroom once per hour tonight. He denies morning headaches but does endorse intermittent restless leg symptoms and his wife endorses that he has a history of leg twitching and kicking during sleep, not so much lately. He works as a Administrator. He has 2 sons, ages 53 and 81.    12/15/2012: Robert Wise is a very pleasant 56 year old right-handed gentleman with an underlying medical history of type 2 diabetes and chronic back pain, who is a commercial truck driver and needs re-evaluation for obstructive sleep apnea. He has been taking Norco for his chronic back pain and he recently counseled him on reducing his narcotic pain medication. He was diagnosed with severe OSA over 10 years ago and has been using a CPAP. He did not bring his machine and I do not have prior sleep study results. He does not know where he had the study done and does not recall what pressure he is on, he uses a nasal pillows mask, medium, he believes. He has mild RLS Sx and was started on Requip, but takes it very infrequently, as he does not have Sx every night. His wife has reported leg kicking in his sleep. He goes to bed around 10 PM and falls asleep within 5 minutes. He has to wake up at 4 AM. He still does not feel very rested, despite being compliant with CPAP. When he first started using CPAP many years, he felt improved. He has EDS and his ESS is 13/24 today. He drives a tanker truck. He has not fallen asleep while driving. He smokes 1 1/2 ppd. He quit EtOH on 08/14/11. He has a Hx of heavy drinking before. His sister has OSA and has a CPAP. He denies morning HAs. He wakes up on an average 3 times/night and has to go to the bathroom each time.  He is a restless sleeper and in the morning, the bed is quite disheveled.   He denies cataplexy, sleep paralysis, hypnagogic or hypnopompic  hallucinations, or sleep attacks. He does not report any vivid dreams, nightmares, dream enactments, or parasomnias, such as sleep talking or sleep walking. The patient has not had a sleep study or a home sleep test in over 10 years.  He consumes 6 to 8 caffeinated beverages per day, usually in the form of coffee, and energy sodas.  His bedroom is usually dark and cool. There is a TV in the bedroom and usually it is not on at night.  He has lost wt in comparison to when he was first diagnosed with OSA.  His Past Medical History Is Significant For: Past Medical History:  Diagnosis Date  . Anxiety   . Depression   . Diabetes mellitus without complication ([place])   . Hyperlipidemia   . OSA (obstructive  sleep apnea) 12/15/2012    His Past Surgical History Is Significant For: Past Surgical History:  Procedure Laterality Date  . ANKLE SURGERY Left 2006  . VASECTOMY      His Family History Is Significant For: No family history on file.  His Social History Is Significant For: Social History   Social History  . Marital status: Married    Spouse name: [name]  . Number of children: 3  . Years of education: 11.5   Occupational History  .  [employer]    Driver   Social History Main Topics  . Smoking status: Current Every Day Smoker    Packs/day: 1.50    Years: 37.00    Types: Cigarettes  . Smokeless tobacco: Never Used  . Alcohol use No     Comment: quit: 08/14/2011  . Drug use: No  . Sexual activity: Yes    Birth control/ protection: None   Other Topics Concern  . None   Social History Narrative   Patient lives at home with family.    Caffeine Use: 1 pot of coffee daily    His Allergies Are:  Allergies  Allergen Reactions  . Tricor [Fenofibrate]   . Zocor [Simvastatin]   :   His Current Medications Are:  Outpatient Encounter Prescriptions as of 11/28/2016  Medication Sig  . ARIPiprazole (ABILIFY) 15 MG tablet TAKE 1 TABLET (15 MG TOTAL) BY MOUTH DAILY.  Marland Kitchen  atorvastatin (LIPITOR) 20 MG tablet TAKE 1 TABLET (20 MG TOTAL) BY MOUTH DAILY.  Marland Kitchen Blood Glucose Monitoring Suppl (ONE TOUCH ULTRA SYSTEM KIT) W/DEVICE KIT 1 kit by Does not apply route once.  . busPIRone (BUSPAR) 7.5 MG tablet Take 1 tablet (7.5 mg total) by mouth 2 (two) times daily.  . cyclobenzaprine (FLEXERIL) 10 MG tablet Take 0.5-1 tablets (5-10 mg total) by mouth at bedtime.  . Dapagliflozin-Metformin HCl ER (XIGDUO XR) 07-998 MG TB24 Take 2 tablets by mouth daily.  . DULoxetine (CYMBALTA) 60 MG capsule TAKE 1 CAPSULE (60 MG TOTAL) BY MOUTH DAILY.  Marland Kitchen esomeprazole (NEXIUM) 40 MG capsule TAKE 1 CAPSULE (40 MG TOTAL) BY MOUTH DAILY.  . fish oil-omega-3 fatty acids 1000 MG capsule Take 2 g by mouth daily.  Marland Kitchen glucose blood (ONE TOUCH ULTRA TEST) test strip 1 each by Other route daily.  . Lancets (ONETOUCH ULTRASOFT) lancets USE AS INSTRUCTED  . levothyroxine (SYNTHROID, LEVOTHROID) 150 MCG tablet Take 1 tablet (150 mcg total) by mouth daily before breakfast.  . lisinopril (PRINIVIL,ZESTRIL) 5 MG tablet TAKE 1 TABLET (5 MG TOTAL) BY MOUTH DAILY.  . nicotine (NICODERM CQ) 21 mg/24hr patch Place 1 patch (21 mg total) onto the skin daily.  . [DISCONTINUED] lisinopril (PRINIVIL,ZESTRIL) 5 MG tablet Take 1 tablet (5 mg total) by mouth daily.   No facility-administered encounter medications on file as of 11/28/2016.   :  Review of Systems:  Out of a complete 14 point review of systems, all are reviewed and negative with the exception of these symptoms as listed below:  Review of Systems  Neurological:       Pt presents today to discuss his cpap. Pt feels better after using his cpap and reports that it is going well.    Objective:  Neurological Exam  Physical Exam Physical Examination:   Vitals:   11/28/16 1514  BP: (!) 150/78  Pulse: 80   General Examination: The patient is a very pleasant 56 y.o. male in no acute distress. He appears well-developed and well-nourished and  well groomed.    HEENT: Normocephalic, atraumatic, pupils are equal, round and reactive to light and accommodation. Extraocular tracking is good without limitation to gaze excursion or nystagmus noted. He has mild facial masking and decrease in eye blink rate, slower eye blink. Minimal nuchal rigidity is noted. Speech is normal to perhaps mildly hypophonic, no lip, neck or jaw tremor, slightly slow speech. Airway examination reveals moderate airway crowding. Tongue protrudes centrally and palate elevates symmetrically. Hearing is intact.  Chest: Clear to auscultation without wheezing, rhonchi or crackles noted.  Heart: S1+S2+0, regular and normal without murmurs, rubs or gallops noted.   Abdomen: Soft, non-tender and non-distended with normal bowel sounds appreciated on auscultation.  Extremities: There is no obvious abnormality.   Skin: Warm and dry without trophic changes noted.  Musculoskeletal: exam reveals no obvious joint deformities, tenderness or joint swelling or erythema.   Neurologically:  Mental status: The patient is awake, alert and oriented in all 4 spheres. His immediate and remote memory, attention, language skills and fund of knowledge are appropriate. There is no evidence of aphasia, agnosia, apraxia or anomia. Speech is as above. Thought process is linear. Mood is normal and affect is normal. Some slowness in thinking noted. Cranial nerves II - XII are as described above under HEENT exam. In addition: shoulder shrug is normal with equal shoulder height noted. Motor exam: Normal bulk, strength and tone is noted. There is no drift, tremor or rebound. Romberg is negative. Reflexes are 2+ throughout. Fine motor skills and coordination: intact with normal finger taps, normal hand movements, normal rapid alternating patting, normal foot taps and normal foot agility, but mild bradykinesia.  Cerebellar testing: No dysmetria or intention tremor. There is no truncal or gait ataxia.  Sensory  exam: intact to light touch in the upper and lower extremities.  Gait, station and balance: He stands easily. No veering to one side is noted. No leaning to one side is noted. Posture is age-appropriate and stance is narrow based. Gait shows normal stride length and normal pace. No problems turning are noted.   Assessment and plan:  In summary, TELLIS SPIVAK is a very pleasant 56 year old male with an underlying medical history of diabetes, thyroid disease, reflux disease, depression, anxiety, hyperlipidemia, chronic back pain, and obesity, who presents for follow-up consultation of his severe obstructive sleep apnea after his split-night sleep study on 08/21/2016 confirmed his diagnosis. He was previously struggling with his CPAP machine and has been doing well recently. He is fully compliant with treatment. Leak is rather high but it could be because of mouth opening. She is using a nasal mask, does have facial hair and may eventually benefit from trying a full facemask but for now we will try a chinstrap. He is agreeable to doing this. He is on a pressure of 14 cm and uses the humidifier and does not suffer from significant mouth dryness at this time. He had some recent medication changes. I'm hoping that eventually he can perhaps low with the Abilify as he does seem to have mild parkinsonian features which could be medication induced from the Abilify. I encouraged him to be fully compliant with CPAP and commended him for his excellent treatment adherence at this time. Thankfully he feels improved and has benefited from CPAP as well. I suggested a six-month follow-up with one of our nurse practitioners, sooner as needed. I answered all his questions today and he was in agreement.  I spent 25 minutes in total face-to-face time  with the patient, more than 50% of which was spent in counseling and coordination of care, reviewing test results, reviewing medication and discussing or reviewing the diagnosis of  OSA, its prognosis and treatment options. Pertinent laboratory and imaging test results that were available during this visit with the patient were reviewed by me and considered in my medical decision making (see chart for details).

## 2017-01-01 ENCOUNTER — Other Ambulatory Visit: Payer: Self-pay | Admitting: Urgent Care

## 2017-01-01 DIAGNOSIS — E1165 Type 2 diabetes mellitus with hyperglycemia: Secondary | ICD-10-CM

## 2017-01-01 DIAGNOSIS — E118 Type 2 diabetes mellitus with unspecified complications: Principal | ICD-10-CM

## 2017-01-01 DIAGNOSIS — IMO0002 Reserved for concepts with insufficient information to code with codable children: Secondary | ICD-10-CM

## 2017-01-12 ENCOUNTER — Other Ambulatory Visit: Payer: Self-pay | Admitting: Urgent Care

## 2017-01-12 DIAGNOSIS — E782 Mixed hyperlipidemia: Secondary | ICD-10-CM

## 2017-02-01 ENCOUNTER — Other Ambulatory Visit: Payer: Self-pay | Admitting: Urgent Care

## 2017-02-01 DIAGNOSIS — F419 Anxiety disorder, unspecified: Principal | ICD-10-CM

## 2017-02-01 DIAGNOSIS — F32A Depression, unspecified: Secondary | ICD-10-CM

## 2017-02-01 DIAGNOSIS — F329 Major depressive disorder, single episode, unspecified: Secondary | ICD-10-CM

## 2017-02-05 NOTE — Telephone Encounter (Signed)
Pharmacy called  Verifying  Last  cymbalta     Dose

## 2017-02-07 NOTE — Telephone Encounter (Signed)
Please schedule for f/u within the next month.

## 2017-02-10 ENCOUNTER — Encounter: Payer: Self-pay | Admitting: Urgent Care

## 2017-02-10 ENCOUNTER — Ambulatory Visit (INDEPENDENT_AMBULATORY_CARE_PROVIDER_SITE_OTHER): Payer: 59 | Admitting: Urgent Care

## 2017-02-10 ENCOUNTER — Ambulatory Visit (INDEPENDENT_AMBULATORY_CARE_PROVIDER_SITE_OTHER): Payer: Self-pay | Admitting: Urgent Care

## 2017-02-10 VITALS — BP 127/76 | HR 88 | Temp 98.4°F | Resp 16 | Ht 72.0 in | Wt 235.0 lb

## 2017-02-10 DIAGNOSIS — E782 Mixed hyperlipidemia: Secondary | ICD-10-CM

## 2017-02-10 DIAGNOSIS — F32A Depression, unspecified: Secondary | ICD-10-CM

## 2017-02-10 DIAGNOSIS — F419 Anxiety disorder, unspecified: Secondary | ICD-10-CM

## 2017-02-10 DIAGNOSIS — Z23 Encounter for immunization: Secondary | ICD-10-CM

## 2017-02-10 DIAGNOSIS — I10 Essential (primary) hypertension: Secondary | ICD-10-CM

## 2017-02-10 DIAGNOSIS — Z024 Encounter for examination for driving license: Secondary | ICD-10-CM

## 2017-02-10 DIAGNOSIS — F329 Major depressive disorder, single episode, unspecified: Secondary | ICD-10-CM

## 2017-02-10 DIAGNOSIS — E119 Type 2 diabetes mellitus without complications: Secondary | ICD-10-CM

## 2017-02-10 DIAGNOSIS — E039 Hypothyroidism, unspecified: Secondary | ICD-10-CM

## 2017-02-10 LAB — POCT GLYCOSYLATED HEMOGLOBIN (HGB A1C)
Hemoglobin A1C: 8.4
Hemoglobin A1C: 8.4

## 2017-02-10 NOTE — Progress Notes (Signed)
POC a1c was drawn to check for his diabetes.

## 2017-02-10 NOTE — Progress Notes (Signed)
    Commercial Driver Medical Examination   Robert Wise is a [age] y.o. male who presents today for a DOT physical exam. The patient reports history of diabetes, HTN, HL, anxiety and depression, GERD. All managed with medical therapy. Denies dizziness, chronic headache, polydipsia, chest pain, shortness of breath, heart racing, palpitations, nausea, vomiting, abdominal pain, hematuria, polyuria, lower leg swelling. Smokes 1.5ppd.   The following portions of the patient's history were reviewed and updated as appropriate: allergies, current medications, past family history, past medical history, past social history and past surgical history.  Objective:   BP 127/76   Pulse 88   Temp 98.4 F (36.9 C) (Oral)   Resp 16   Ht 6' (1.829 m)   Wt 235 lb (106.6 kg)   SpO2 98%   BMI 31.87 kg/m   Vision/hearing:  Visual Acuity Screening   Right eye Left eye Both eyes  Without correction: 20/25 20/25-2 20/25  With correction:     Comments: Peripheral Vision: Right eye 85 degrees. Left eye 85 degrees.  The patient can distinguish the colors red, amber and green.  Hearing Screening Comments: The patient was able to hear a forced whisper from 10 feet.  Patient can recognize and distinguish among traffic control signals and devices showing standard red, green, and amber colors.  Corrective lenses required: No  Monocular Vision?: No  Hearing aid requirement: No  Physical Exam  Constitutional: He is oriented to person, place, and time. He appears well-developed and well-nourished.  HENT:  TM's intact bilaterally, no effusions or erythema. Nasal turbinates pink and moist, nasal passages patent. No sinus tenderness. Oropharynx clear, mucous membranes moist, dentition in good repair.  Eyes: Conjunctivae and EOM are normal. Pupils are equal, round, and reactive to light. Right eye exhibits no discharge. Left eye exhibits no discharge. No scleral icterus.  Neck: Normal range of motion.  Neck supple.  Cardiovascular: Normal rate, regular rhythm and intact distal pulses. Exam reveals no gallop and no friction rub.  No murmur heard. Pulmonary/Chest: No stridor. No respiratory distress. He has no wheezes. He has no rales.  Abdominal: Soft. Bowel sounds are normal. He exhibits no distension and no mass. There is no tenderness.  Musculoskeletal: Normal range of motion. He exhibits no edema or tenderness.  Lymphadenopathy:    He has no cervical adenopathy.  Neurological: He is alert and oriented to person, place, and time. He has normal reflexes. He displays normal reflexes. Coordination normal.  Skin: Skin is warm and dry. No rash noted. No erythema. No pallor.  Psychiatric: He has a normal mood and affect.   Labs: Comments: Urine Specimen:  SpGr:  1.010   Blood:  Negative   Protein:  Trace    Sugar:  1000  Results for orders placed or performed in visit on 02/10/17 (from the past 24 hour(s))  POCT glycosylated hemoglobin (Hb A1C)     Status: None   Collection Time: 02/10/17 10:33 AM  Result Value Ref Range   Hemoglobin A1C 8.4    Assessment:    Healthy male exam.  Meets standards, but periodic monitoring required due to DM, HTN, HL.  Driver qualified only for 1 year.    Plan:   Medical examiners certificate completed and printed. Return as needed.  Robert BambergMario Tyrene Nader, PA-C [facility] [phone] 02/10/2017  10:15 AM

## 2017-02-10 NOTE — Patient Instructions (Signed)
Health Maintenance, Male A healthy lifestyle and preventive care is important for your health and wellness. Ask your health care provider about what schedule of regular examinations is right for you. What should I know about weight and diet? Eat a Healthy Diet  Eat plenty of vegetables, fruits, whole grains, low-fat dairy products, and lean protein.  Do not eat a lot of foods high in solid fats, added sugars, or salt.  Maintain a Healthy Weight Regular exercise can help you achieve or maintain a healthy weight. You should:  Do at least 150 minutes of exercise each week. The exercise should increase your heart rate and make you sweat (moderate-intensity exercise).  Do strength-training exercises at least twice a week.  Watch Your Levels of Cholesterol and Blood Lipids  Have your blood tested for lipids and cholesterol every 5 years starting at 56 years of age. If you are at high risk for heart disease, you should start having your blood tested when you are 56 years old. You may need to have your cholesterol levels checked more often if: ? Your lipid or cholesterol levels are high. ? You are older than 56 years of age. ? You are at high risk for heart disease.  What should I know about cancer screening? Many types of cancers can be detected early and may often be prevented. Lung Cancer  You should be screened every year for lung cancer if: ? You are a current smoker who has smoked for at least 30 years. ? You are a former smoker who has quit within the past 15 years.  Talk to your health care provider about your screening options, when you should start screening, and how often you should be screened.  Colorectal Cancer  Routine colorectal cancer screening usually begins at 56 years of age and should be repeated every 5-10 years until you are 56 years old. You may need to be screened more often if early forms of precancerous polyps or small growths are found. Your health care provider  may recommend screening at an earlier age if you have risk factors for colon cancer.  Your health care provider may recommend using home test kits to check for hidden blood in the stool.  A small camera at the end of a tube can be used to examine your colon (sigmoidoscopy or colonoscopy). This checks for the earliest forms of colorectal cancer.  Prostate and Testicular Cancer  Depending on your age and overall health, your health care provider may do certain tests to screen for prostate and testicular cancer.  Talk to your health care provider about any symptoms or concerns you have about testicular or prostate cancer.  Skin Cancer  Check your skin from head to toe regularly.  Tell your health care provider about any new moles or changes in moles, especially if: ? There is a change in a mole's size, shape, or color. ? You have a mole that is larger than a pencil eraser.  Always use sunscreen. Apply sunscreen liberally and repeat throughout the day.  Protect yourself by wearing long sleeves, pants, a wide-brimmed hat, and sunglasses when outside.  What should I know about heart disease, diabetes, and high blood pressure?  If you are 18-39 years of age, have your blood pressure checked every 3-5 years. If you are 40 years of age or older, have your blood pressure checked every year. You should have your blood pressure measured twice-once when you are at a hospital or clinic, and once when   you are not at a hospital or clinic. Record the average of the two measurements. To check your blood pressure when you are not at a hospital or clinic, you can use: ? An automated blood pressure machine at a pharmacy. ? A home blood pressure monitor.  Talk to your health care provider about your target blood pressure.  If you are between 45-79 years old, ask your health care provider if you should take aspirin to prevent heart disease.  Have regular diabetes screenings by checking your fasting blood  sugar level. ? If you are at a normal weight and have a low risk for diabetes, have this test once every three years after the age of 45. ? If you are overweight and have a high risk for diabetes, consider being tested at a younger age or more often.  A one-time screening for abdominal aortic aneurysm (AAA) by ultrasound is recommended for men aged 65-75 years who are current or former smokers. What should I know about preventing infection? Hepatitis B If you have a higher risk for hepatitis B, you should be screened for this virus. Talk with your health care provider to find out if you are at risk for hepatitis B infection. Hepatitis C Blood testing is recommended for:  Everyone born from 1945 through 1965.  Anyone with known risk factors for hepatitis C.  Sexually Transmitted Diseases (STDs)  You should be screened each year for STDs including gonorrhea and chlamydia if: ? You are sexually active and are younger than 56 years of age. ? You are older than 56 years of age and your health care provider tells you that you are at risk for this type of infection. ? Your sexual activity has changed since you were last screened and you are at an increased risk for chlamydia or gonorrhea. Ask your health care provider if you are at risk.  Talk with your health care provider about whether you are at high risk of being infected with HIV. Your health care provider may recommend a prescription medicine to help prevent HIV infection.  What else can I do?  Schedule regular health, dental, and eye exams.  Stay current with your vaccines (immunizations).  Do not use any tobacco products, such as cigarettes, chewing tobacco, and e-cigarettes. If you need help quitting, ask your health care provider.  Limit alcohol intake to no more than 2 drinks per day. One drink equals 12 ounces of beer, 5 ounces of wine, or 1 ounces of hard liquor.  Do not use street drugs.  Do not share needles.  Ask your  health care provider for help if you need support or information about quitting drugs.  Tell your health care provider if you often feel depressed.  Tell your health care provider if you have ever been abused or do not feel safe at home. This information is not intended to replace advice given to you by your health care provider. Make sure you discuss any questions you have with your health care provider. Document Released: 09/14/2007 Document Revised: 11/15/2015 Document Reviewed: 12/20/2014 Elsevier Interactive Patient Education  2018 [employer]     IF you received an x-ray today, you will receive an invoice from [facility]. Please contact [facility] at [phone] with questions or concerns regarding your invoice.   IF you received labwork today, you will receive an invoice from [facility]. Please contact [facility] at [phone] with questions or concerns regarding your invoice.   Our billing staff will not be   able to assist you with questions regarding bills from these companies.  You will be contacted with the lab results as soon as they are available. The fastest way to get your results is to activate your My Chart account. Instructions are located on the last page of this paperwork. If you have not heard from us regarding the results in 2 weeks, please contact this office.       

## 2017-02-13 ENCOUNTER — Other Ambulatory Visit: Payer: Self-pay | Admitting: Physician Assistant

## 2017-02-13 DIAGNOSIS — E782 Mixed hyperlipidemia: Secondary | ICD-10-CM

## 2017-02-13 NOTE — Telephone Encounter (Signed)
No  Lipid  /  Liver   Panel   Done   Within  6  Months  Pt requesting  Refill  Of  lipitor

## 2017-02-14 ENCOUNTER — Other Ambulatory Visit: Payer: Self-pay | Admitting: Urgent Care

## 2017-02-14 DIAGNOSIS — E1165 Type 2 diabetes mellitus with hyperglycemia: Secondary | ICD-10-CM

## 2017-02-14 DIAGNOSIS — IMO0002 Reserved for concepts with insufficient information to code with codable children: Secondary | ICD-10-CM

## 2017-02-14 DIAGNOSIS — E118 Type 2 diabetes mellitus with unspecified complications: Principal | ICD-10-CM

## 2017-03-01 ENCOUNTER — Other Ambulatory Visit: Payer: Self-pay | Admitting: Urgent Care

## 2017-03-01 DIAGNOSIS — E782 Mixed hyperlipidemia: Secondary | ICD-10-CM

## 2017-03-04 ENCOUNTER — Other Ambulatory Visit: Payer: Self-pay | Admitting: Urgent Care

## 2017-03-04 DIAGNOSIS — E782 Mixed hyperlipidemia: Secondary | ICD-10-CM

## 2017-03-04 MED ORDER — ATORVASTATIN CALCIUM 20 MG PO TABS: 20.0000 mg | ORAL_TABLET | Freq: Every day | ORAL | 0 refills | Status: DC

## 2017-03-19 ENCOUNTER — Other Ambulatory Visit: Payer: Self-pay | Admitting: Urgent Care

## 2017-03-31 ENCOUNTER — Other Ambulatory Visit: Payer: Self-pay | Admitting: Urgent Care

## 2017-03-31 DIAGNOSIS — E782 Mixed hyperlipidemia: Secondary | ICD-10-CM

## 2017-04-11 ENCOUNTER — Ambulatory Visit (INDEPENDENT_AMBULATORY_CARE_PROVIDER_SITE_OTHER): Payer: 59 | Admitting: Urgent Care

## 2017-04-11 ENCOUNTER — Encounter: Payer: Self-pay | Admitting: Urgent Care

## 2017-04-11 VITALS — BP 126/84 | HR 80 | Temp 98.5°F | Resp 17 | Ht 72.5 in | Wt 234.0 lb

## 2017-04-11 DIAGNOSIS — F329 Major depressive disorder, single episode, unspecified: Secondary | ICD-10-CM

## 2017-04-11 DIAGNOSIS — E119 Type 2 diabetes mellitus without complications: Secondary | ICD-10-CM

## 2017-04-11 DIAGNOSIS — I10 Essential (primary) hypertension: Secondary | ICD-10-CM | POA: Diagnosis not present

## 2017-04-11 DIAGNOSIS — E782 Mixed hyperlipidemia: Secondary | ICD-10-CM | POA: Diagnosis not present

## 2017-04-11 DIAGNOSIS — F32A Depression, unspecified: Secondary | ICD-10-CM

## 2017-04-11 DIAGNOSIS — F17200 Nicotine dependence, unspecified, uncomplicated: Secondary | ICD-10-CM

## 2017-04-11 DIAGNOSIS — E039 Hypothyroidism, unspecified: Secondary | ICD-10-CM

## 2017-04-11 DIAGNOSIS — F419 Anxiety disorder, unspecified: Secondary | ICD-10-CM | POA: Diagnosis not present

## 2017-04-11 LAB — POCT GLYCOSYLATED HEMOGLOBIN (HGB A1C): Hemoglobin A1C: 8.4

## 2017-04-11 MED ORDER — ATORVASTATIN CALCIUM 40 MG PO TABS: 40.0000 mg | ORAL_TABLET | Freq: Every day | ORAL | 3 refills | Status: DC

## 2017-04-11 MED ORDER — NICOTINE 21 MG/24HR TD PT24: 21.0000 mg | MEDICATED_PATCH | Freq: Every day | TRANSDERMAL | 0 refills | Status: DC

## 2017-04-11 MED ORDER — DULOXETINE HCL 60 MG PO CPEP: ORAL_CAPSULE | ORAL | 3 refills | Status: DC

## 2017-04-11 MED ORDER — DAPAGLIFLOZIN PRO-METFORMIN ER 5-1000 MG PO TB24: 2.0000 | ORAL_TABLET | Freq: Every day | ORAL | 1 refills | Status: DC

## 2017-04-11 MED ORDER — LISINOPRIL 5 MG PO TABS: 5.0000 mg | ORAL_TABLET | Freq: Every day | ORAL | 3 refills | Status: DC

## 2017-04-11 MED ORDER — ESOMEPRAZOLE MAGNESIUM 40 MG PO CPDR: 40.0000 mg | DELAYED_RELEASE_CAPSULE | Freq: Every day | ORAL | 3 refills | Status: AC

## 2017-04-11 MED ORDER — BUSPIRONE HCL 7.5 MG PO TABS: 7.5000 mg | ORAL_TABLET | Freq: Two times a day (BID) | ORAL | 1 refills | Status: DC

## 2017-04-11 NOTE — Progress Notes (Signed)
MRN: [id number] DOB: October 20, 1960  Subjective:   Robert Wise is a 57 y.o. male presenting for follow up on Hypertension, DM, hypothyroidism.   HTN - Currently managed with lisinopril. Avoids salt in diet. Denies lightheadedness, dizziness, chronic headache, chest pain, shortness of breath, heart racing, palpitations, nausea, vomiting, abdominal pain, hematuria, lower leg swelling. Smoking 1.5ppd and is willing to try quitting.   DM - Managed with Xigduo. Admits that his diet was not compliant over the holidays. Patient polydipsia, polyuria, skin infections, numbness or tingling. Denies foot concerns. Needs to have an eye exam. Needs to have foot exam.  HL - Managed with atorvastatin.  Anxiety/Depression - Patient feels much better. Currently taking duloxetine, buspar. He has a lot more support with this work. Still has some episodes of crying but overall does well. Denies SI, HI, panic attacks.   Hypothyroidism - Managed with levothyroxine.   Robert Wise has a current medication list which includes the following prescription(s): aripiprazole, atorvastatin, one touch ultra system kit, buspirone, dapagliflozin-metformin hcl er, duloxetine, esomeprazole, fish oil-omega-3 fatty acids, glucose blood, onetouch ultrasoft, levothyroxine, lisinopril, and xigduo xr. Also is allergic to tricor [fenofibrate] and zocor [simvastatin].  Robert Wise  has a past medical history of Anxiety, Depression, Diabetes mellitus without complication ([place]), Hyperlipidemia, and OSA (obstructive sleep apnea) (12/15/2012). Also  has a past surgical history that includes Ankle surgery (Left, 2006) and Vasectomy.  Objective:   Vitals: BP 126/84   Pulse 80   Temp 98.5 F (36.9 C) (Oral)   Resp 17   Ht 6' 0.5" (1.842 m)   Wt 234 lb (106.1 kg)   SpO2 98%   BMI 31.30 kg/m   BP Readings from Last 3 Encounters:  04/11/17 126/84  02/10/17 127/76  11/28/16 (!) 150/78    Physical Exam  Constitutional: He is oriented to person,  place, and time. He appears well-developed and well-nourished.  HENT:  Mouth/Throat: Oropharynx is clear and moist.  Eyes: Pupils are equal, round, and reactive to light. No scleral icterus.  Neck: Normal range of motion. Neck supple. No thyromegaly present.  Cardiovascular: Normal rate, regular rhythm and intact distal pulses. Exam reveals no gallop and no friction rub.  No murmur heard. Pulmonary/Chest: No respiratory distress. He has no wheezes. He has no rales.  Abdominal: Soft. Bowel sounds are normal. He exhibits no distension and no mass. There is no tenderness. There is no guarding.  Musculoskeletal: He exhibits no edema.  Neurological: He is alert and oriented to person, place, and time.  Skin: Skin is warm and dry.  Psychiatric: He has a normal mood and affect.   Diabetic Foot Exam - Simple   Simple Foot Form Diabetic Foot exam was performed with the following findings:  Yes 04/11/2017 11:10 AM  Visual Inspection No deformities, no ulcerations, no other skin breakdown bilaterally:  Yes Sensation Testing Intact to touch and monofilament testing bilaterally:  Yes Pulse Check Posterior Tibialis and Dorsalis pulse intact bilaterally:  Yes Comments     Results for orders placed or performed in visit on 04/11/17 (from the past 24 hour(s))  POCT glycosylated hemoglobin (Hb A1C)     Status: Abnormal   Collection Time: 04/11/17 11:06 AM  Result Value Ref Range   Hemoglobin A1C 8.4    Assessment and Plan :   Essential hypertension - Plan: Comprehensive metabolic panel, Lipid panel, Microalbumin / creatinine urine ratio, POCT glycosylated hemoglobin (Hb A1C)  Hypothyroidism, unspecified type - Plan: Thyroid Panel With TSH  Mixed hyperlipidemia -  Plan: Lipid panel, atorvastatin (LIPITOR) 40 MG tablet  Type 2 diabetes mellitus without complication, without long-term current use of insulin (HCC) - Plan: Dapagliflozin-Metformin HCl ER (XIGDUO XR) 07-998 MG TB24  Anxiety and  depression - Plan: DULoxetine (CYMBALTA) 60 MG capsule  Tobacco use disorder  Refilled his diabetes, HL, HTN, anxiety medications. Will await results for thyroid medication refill. Counseled on lifestyle changes. Will start trial of nicoderm patches. Return-to-clinic precautions discussed, patient verbalized understanding. Otherwise, follow up in ~5 weeks for recheck on smoking cessation.  Jaynee Eagles, PA-C [facility] [phone] 04/11/2017  11:01 AM

## 2017-04-11 NOTE — Patient Instructions (Addendum)
Diabetes Mellitus and Nutrition When you have diabetes (diabetes mellitus), it is very important to have healthy eating habits because your blood sugar (glucose) levels are greatly affected by what you eat and drink. Eating healthy foods in the appropriate amounts, at about the same times every day, can help you:  Control your blood glucose.  Lower your risk of heart disease.  Improve your blood pressure.  Reach or maintain a healthy weight.  Every person with diabetes is different, and each person has different needs for a meal plan. Your health care provider may recommend that you work with a diet and nutrition specialist (dietitian) to make a meal plan that is best for you. Your meal plan may vary depending on factors such as:  The calories you need.  The medicines you take.  Your weight.  Your blood glucose, blood pressure, and cholesterol levels.  Your activity level.  Other health conditions you have, such as heart or kidney disease.  How do carbohydrates affect me? Carbohydrates affect your blood glucose level more than any other type of food. Eating carbohydrates naturally increases the amount of glucose in your blood. Carbohydrate counting is a method for keeping track of how many carbohydrates you eat. Counting carbohydrates is important to keep your blood glucose at a healthy level, especially if you use insulin or take certain oral diabetes medicines. It is important to know how many carbohydrates you can safely have in each meal. This is different for every person. Your dietitian can help you calculate how many carbohydrates you should have at each meal and for snack. Foods that contain carbohydrates include:  Bread, cereal, rice, pasta, and crackers.  Potatoes and corn.  Peas, beans, and lentils.  Milk and yogurt.  Fruit and juice.  Desserts, such as cakes, cookies, ice cream, and candy.  How does alcohol affect me? Alcohol can cause a sudden decrease in blood  glucose (hypoglycemia), especially if you use insulin or take certain oral diabetes medicines. Hypoglycemia can be a life-threatening condition. Symptoms of hypoglycemia (sleepiness, dizziness, and confusion) are similar to symptoms of having too much alcohol. If your health care provider says that alcohol is safe for you, follow these guidelines:  Limit alcohol intake to no more than 1 drink per day for nonpregnant women and 2 drinks per day for men. One drink equals 12 oz of beer, 5 oz of wine, or 1 oz of hard liquor.  Do not drink on an empty stomach.  Keep yourself hydrated with water, diet soda, or unsweetened iced tea.  Keep in mind that regular soda, juice, and other mixers may contain a lot of sugar and must be counted as carbohydrates.  What are tips for following this plan? Reading food labels  Start by checking the serving size on the label. The amount of calories, carbohydrates, fats, and other nutrients listed on the label are based on one serving of the food. Many foods contain more than one serving per package.  Check the total grams (g) of carbohydrates in one serving. You can calculate the number of servings of carbohydrates in one serving by dividing the total carbohydrates by 15. For example, if a food has 30 g of total carbohydrates, it would be equal to 2 servings of carbohydrates.  Check the number of grams (g) of saturated and trans fats in one serving. Choose foods that have low or no amount of these fats.  Check the number of milligrams (mg) of sodium in one serving. Most people   should limit total sodium intake to less than 2,300 mg per day.  Always check the nutrition information of foods labeled as "low-fat" or "nonfat". These foods may be higher in added sugar or refined carbohydrates and should be avoided.  Talk to your dietitian to identify your daily goals for nutrients listed on the label. Shopping  Avoid buying canned, premade, or processed foods. These  foods tend to be high in fat, sodium, and added sugar.  Shop around the outside edge of the grocery store. This includes fresh fruits and vegetables, bulk grains, fresh meats, and fresh dairy. Cooking  Use low-heat cooking methods, such as baking, instead of high-heat cooking methods like deep frying.  Cook using healthy oils, such as olive, canola, or sunflower oil.  Avoid cooking with butter, cream, or high-fat meats. Meal planning  Eat meals and snacks regularly, preferably at the same times every day. Avoid going long periods of time without eating.  Eat foods high in fiber, such as fresh fruits, vegetables, beans, and whole grains. Talk to your dietitian about how many servings of carbohydrates you can eat at each meal.  Eat 4-6 ounces of lean protein each day, such as lean meat, chicken, fish, eggs, or tofu. 1 ounce is equal to 1 ounce of meat, chicken, or fish, 1 egg, or 1/4 cup of tofu.  Eat some foods each day that contain healthy fats, such as avocado, nuts, seeds, and fish. Lifestyle   Check your blood glucose regularly.  Exercise at least 30 minutes 5 or more days each week, or as told by your health care provider.  Take medicines as told by your health care provider.  Do not use any products that contain nicotine or tobacco, such as cigarettes and e-cigarettes. If you need help quitting, ask your health care provider.  Work with a Veterinary surgeon or diabetes educator to identify strategies to manage stress and any emotional and social challenges. What are some questions to ask my health care provider?  Do I need to meet with a diabetes educator?  Do I need to meet with a dietitian?  What number can I call if I have questions?  When are the best times to check my blood glucose? Where to find more information:  American Diabetes Association: diabetes.org/food-and-fitness/food  Academy of Nutrition and Dietetics:  https://www.vargas.com/  [employer] of Diabetes and Digestive and Kidney Diseases (NIH): FindJewelers.cz Summary  A healthy meal plan will help you control your blood glucose and maintain a healthy lifestyle.  Working with a diet and nutrition specialist (dietitian) can help you make a meal plan that is best for you.  Keep in mind that carbohydrates and alcohol have immediate effects on your blood glucose levels. It is important to count carbohydrates and to use alcohol carefully. This information is not intended to replace advice given to you by your health care provider. Make sure you discuss any questions you have with your health care provider. Document Released: 12/13/2004 Document Revised: 04/22/2016 Document Reviewed: 04/22/2016 Elsevier Interactive Patient Education  2018 [employer].     Nicotine skin patches What is this medicine? NICOTINE (NIK [place] teen) helps people stop smoking. The patches replace the nicotine found in cigarettes and help to decrease withdrawal effects. They are most effective when used in combination with a stop-smoking program. This medicine may be used for other purposes; ask your health care provider or pharmacist if you have questions. COMMON BRAND NAME(S): Habitrol, Nicoderm CQ, Nicotrol What should I tell my health  care provider before I take this medicine? They need to know if you have any of these conditions: -diabetes -heart disease, angina, irregular heartbeat or previous heart attack -high blood pressure -lung disease, including asthma -overactive thyroid -pheochromocytoma -seizures or a history of seizures -skin problems, like eczema -stomach problems or ulcers -an unusual or allergic reaction to nicotine, adhesives, other medicines, foods, dyes, or preservatives -pregnant or trying to get pregnant -breast-feeding How  should I use this medicine? This medicine is for use on the skin. Follow the directions that come with the patches. Find an area of skin on your upper arm, chest, or back that is clean, dry, greaseless, undamaged and hairless. Wash hands with plain soap and water. Do not use anything that contains aloe, lanolin or glycerin as these may prevent the patch from sticking. Dry thoroughly. Remove the patch from the sealed pouch. Do not try to cut or trim the patch. Using your palm, press the patch firmly in place for 10 seconds to make sure that there is good contact with your skin. After applying the patch, wash your hands. Change the patch every day, keeping to a regular schedule. When you apply a new patch, use a new area of skin. Wait at least 1 week before using the same area again. Talk to your pediatrician regarding the use of this medicine in children. Special care may be needed. Overdosage: If you think you have taken too much of this medicine contact a poison control center or emergency room at once. NOTE: This medicine is only for you. Do not share this medicine with others. What if I miss a dose? If you forget to replace a patch, use it as soon as you can. Only use one patch at a time and do not leave on the skin for longer than directed. If a patch falls off, you can replace it, but keep to your schedule and remove the patch at the right time. What may interact with this medicine? -medicines for asthma -medicines for blood pressure -medicines for mental depression This list may not describe all possible interactions. Give your health care provider a list of all the medicines, herbs, non-prescription drugs, or dietary supplements you use. Also tell them if you smoke, drink alcohol, or use illegal drugs. Some items may interact with your medicine. What should I watch for while using this medicine? You should begin using the nicotine patch the day you stop smoking. It is okay if you do not succeed  at your attempt to quit and have a cigarette. You can still continue your quit attempt and keep using the product as directed. Just throw away your cigarettes and get back to your quit plan. You can keep the patch in place during swimming, bathing, and showering. If your patch falls off during these activities, replace it. When you first apply the patch, your skin may itch or burn. This should go away soon. When you remove a patch, the skin may look red, but this should only last for a few days. Call your doctor or health care professional if skin redness does not go away after 4 days, if your skin swells, or if you get a rash. If you are a diabetic and you quit smoking, the effects of insulin may be increased and you may need to reduce your insulin dose. Check with your doctor or health care professional about how you should adjust your insulin dose. If you are going to have a magnetic resonance imaging (MRI)  procedure, tell your MRI technician if you have this patch on your body. It must be removed before a MRI. What side effects may I notice from receiving this medicine? Side effects that you should report to your doctor or health care professional as soon as possible: -allergic reactions like skin rash, itching or hives, swelling of the face, lips, or tongue -breathing problems -changes in hearing -changes in vision -chest pain -cold sweats -confusion -fast, irregular heartbeat -feeling faint or lightheaded, falls -headache -increased saliva -skin redness that lasts more than 4 days -stomach pain -signs and symptoms of nicotine overdose like nausea; vomiting; dizziness; weakness; and rapid heartbeat Side effects that usually do not require medical attention (report to your doctor or health care professional if they continue or are bothersome): -diarrhea -dry mouth -hiccups -irritability -nervousness or restlessness -trouble sleeping or vivid dreams This list may not describe all  possible side effects. Call your doctor for medical advice about side effects. You may report side effects to FDA at 1-800-FDA-1088. Where should I keep my medicine? Keep out of the reach of children. Store at room temperature between 20 and 25 degrees C (68 and 77 degrees F). Protect from heat and light. Store in Tax inspector until ready to use. Throw away unused medicine after the expiration date. When you remove a patch, fold with sticky sides together; put in an empty opened pouch and throw away. NOTE: This sheet is a summary. It may not cover all possible information. If you have questions about this medicine, talk to your doctor, pharmacist, or health care provider.  2018 Elsevier/Gold Standard (2014-02-14 15:46:21)      Steps to Quit Smoking Smoking tobacco can be harmful to your health and can affect almost every organ in your body. Smoking puts you, and those around you, at risk for developing many serious chronic diseases. Quitting smoking is difficult, but it is one of the best things that you can do for your health. It is never too late to quit. What are the benefits of quitting smoking? When you quit smoking, you lower your risk of developing serious diseases and conditions, such as:  Lung cancer or lung disease, such as COPD.  Heart disease.  Stroke.  Heart attack.  Infertility.  Osteoporosis and bone fractures.  Additionally, symptoms such as coughing, wheezing, and shortness of breath may get better when you quit. You may also find that you get sick less often because your body is stronger at fighting off colds and infections. If you are pregnant, quitting smoking can help to reduce your chances of having a baby of low birth weight. How do I get ready to quit? When you decide to quit smoking, create a plan to make sure that you are successful. Before you quit:  Pick a date to quit. Set a date within the next two weeks to give you time to prepare.  Write  down the reasons why you are quitting. Keep this list in places where you will see it often, such as on your bathroom mirror or in your car or wallet.  Identify the people, places, things, and activities that make you want to smoke (triggers) and avoid them. Make sure to take these actions: ? Throw away all cigarettes at home, at work, and in your car. ? Throw away smoking accessories, such as Set designer. ? Clean your car and make sure to empty the ashtray. ? Clean your home, including curtains and carpets.  Tell your family, friends, and  coworkers that you are quitting. Support from your loved ones can make quitting easier.  Talk with your health care provider about your options for quitting smoking.  Find out what treatment options are covered by your health insurance.  What strategies can I use to quit smoking? Talk with your healthcare provider about different strategies to quit smoking. Some strategies include:  Quitting smoking altogether instead of gradually lessening how much you smoke over a period of time. Research shows that quitting "cold [place]" is more successful than gradually quitting.  Attending in-person counseling to help you build problem-solving skills. You are more likely to have success in quitting if you attend several counseling sessions. Even short sessions of 10 minutes can be effective.  Finding resources and support systems that can help you to quit smoking and remain smoke-free after you quit. These resources are most helpful when you use them often. They can include: ? Online chats with a Veterinary surgeon. ? Telephone quitlines. ? Automotive engineer. ? Support groups or group counseling. ? Text messaging programs. ? Mobile phone applications.  Taking medicines to help you quit smoking. (If you are pregnant or breastfeeding, talk with your health care provider first.) Some medicines contain nicotine and some do not. Both types of medicines help  with cravings, but the medicines that include nicotine help to relieve withdrawal symptoms. Your health care provider may recommend: ? Nicotine patches, gum, or lozenges. ? Nicotine inhalers or sprays. ? Non-nicotine medicine that is taken by mouth.  Talk with your health care provider about combining strategies, such as taking medicines while you are also receiving in-person counseling. Using these two strategies together makes you more likely to succeed in quitting than if you used either strategy on its own. If you are pregnant or breastfeeding, talk with your health care provider about finding counseling or other support strategies to quit smoking. Do not take medicine to help you quit smoking unless told to do so by your health care provider. What things can I do to make it easier to quit? Quitting smoking might feel overwhelming at first, but there is a lot that you can do to make it easier. Take these important actions:  Reach out to your family and friends and ask that they support and encourage you during this time. Call telephone quitlines, reach out to support groups, or work with a counselor for support.  Ask people who smoke to avoid smoking around you.  Avoid places that trigger you to smoke, such as bars, parties, or smoke-break areas at work.  Spend time around people who do not smoke.  Lessen stress in your life, because stress can be a smoking trigger for some people. To lessen stress, try: ? Exercising regularly. ? Deep-breathing exercises. ? Yoga. ? Meditating. ? Performing a body scan. This involves closing your eyes, scanning your body from head to toe, and noticing which parts of your body are particularly tense. Purposefully relax the muscles in those areas.  Download or purchase mobile phone or tablet apps (applications) that can help you stick to your quit plan by providing reminders, tips, and encouragement. There are many free apps, such as QuitGuide from the [employer]  Systems developer for Disease Control and Prevention). You can find other support for quitting smoking (smoking cessation) through smokefree.gov and other websites.  How will I feel when I quit smoking? Within the first 24 hours of quitting smoking, you may start to feel some withdrawal symptoms. These symptoms are usually most noticeable 2-3  days after quitting, but they usually do not last beyond 2-3 weeks. Changes or symptoms that you might experience include:  Mood swings.  Restlessness, anxiety, or irritation.  Difficulty concentrating.  Dizziness.  Strong cravings for sugary foods in addition to nicotine.  Mild weight gain.  Constipation.  Nausea.  Coughing or a sore throat.  Changes in how your medicines work in your body.  A depressed mood.  Difficulty sleeping (insomnia).  After the first 2-3 weeks of quitting, you may start to notice more positive results, such as:  Improved sense of smell and taste.  Decreased coughing and sore throat.  Slower heart rate.  Lower blood pressure.  Clearer skin.  The ability to breathe more easily.  Fewer sick days.  Quitting smoking is very challenging for most people. Do not get discouraged if you are not successful the first time. Some people need to make many attempts to quit before they achieve long-term success. Do your best to stick to your quit plan, and talk with your health care provider if you have any questions or concerns. This information is not intended to replace advice given to you by your health care provider. Make sure you discuss any questions you have with your health care provider. Document Released: 03/12/2001 Document Revised: 11/14/2015 Document Reviewed: 08/02/2014 Elsevier Interactive Patient Education  2018 [employer].     IF you received an x-ray today, you will receive an invoice from [facility]. Please contact [facility] at [phone] with questions or concerns regarding your  invoice.   IF you received labwork today, you will receive an invoice from [place]. Please contact [facility] at [phone] with questions or concerns regarding your invoice.   Our billing staff will not be able to assist you with questions regarding bills from these companies.  You will be contacted with the lab results as soon as they are available. The fastest way to get your results is to activate your My Chart account. Instructions are located on the last page of this paperwork. If you have not heard from [place] regarding the results in 2 weeks, please contact this office.

## 2017-04-12 ENCOUNTER — Encounter: Payer: Self-pay | Admitting: Urgent Care

## 2017-04-12 LAB — COMPREHENSIVE METABOLIC PANEL
ALT: 16 IU/L (ref 0–44)
AST: 12 IU/L (ref 0–40)
Albumin/Globulin Ratio: 1.7 (ref 1.2–2.2)
Albumin: 4.5 g/dL (ref 3.5–5.5)
Alkaline Phosphatase: 79 IU/L (ref 39–117)
BUN/Creatinine Ratio: 15 (ref 9–20)
BUN: 13 mg/dL (ref 6–24)
Bilirubin Total: 0.3 mg/dL (ref 0.0–1.2)
CO2: 22 mmol/L (ref 20–29)
Calcium: 9.8 mg/dL (ref 8.7–10.2)
Chloride: 101 mmol/L (ref 96–106)
Creatinine, Ser: 0.85 mg/dL (ref 0.76–1.27)
GFR calc Af Amer: 113 mL/min/{1.73_m2} (ref >59)
GFR calc non Af Amer: 97 mL/min/{1.73_m2} (ref >59)
Globulin, Total: 2.7 g/dL (ref 1.5–4.5)
Glucose: 193 mg/dL — ABNORMAL HIGH (ref 65–99)
Potassium: 4.4 mmol/L (ref 3.5–5.2)
Sodium: 141 mmol/L (ref 134–144)
Total Protein: 7.2 g/dL (ref 6.0–8.5)

## 2017-04-12 LAB — LIPID PANEL
Chol/HDL Ratio: 5.3 ratio — ABNORMAL HIGH (ref 0.0–5.0)
Cholesterol, Total: 155 mg/dL (ref 100–199)
HDL: 29 mg/dL — ABNORMAL LOW (ref >39)
LDL Calculated: 89 mg/dL (ref 0–99)
Triglycerides: 187 mg/dL — ABNORMAL HIGH (ref 0–149)
VLDL Cholesterol Cal: 37 mg/dL (ref 5–40)

## 2017-04-12 LAB — MICROALBUMIN / CREATININE URINE RATIO
Creatinine, Urine: 79.5 mg/dL
Microalb/Creat Ratio: 5.9 mg/g creat (ref 0.0–30.0)
Microalbumin, Urine: 4.7 ug/mL

## 2017-04-12 LAB — THYROID PANEL WITH TSH
Free Thyroxine Index: 2.4 (ref 1.2–4.9)
T3 Uptake Ratio: 28 % (ref 24–39)
T4, Total: 8.7 ug/dL (ref 4.5–12.0)
TSH: 0.901 u[IU]/mL (ref 0.450–4.500)

## 2017-04-17 ENCOUNTER — Other Ambulatory Visit: Payer: Self-pay | Admitting: Urgent Care

## 2017-04-17 DIAGNOSIS — E039 Hypothyroidism, unspecified: Secondary | ICD-10-CM

## 2017-04-17 MED ORDER — LEVOTHYROXINE SODIUM 150 MCG PO TABS: 150.0000 ug | ORAL_TABLET | Freq: Every day | ORAL | 3 refills | Status: DC

## 2017-05-31 ENCOUNTER — Encounter: Payer: Self-pay | Admitting: Adult Health

## 2017-06-02 ENCOUNTER — Encounter: Payer: Self-pay | Admitting: Family Medicine

## 2017-06-02 ENCOUNTER — Ambulatory Visit: Payer: 59 | Admitting: Adult Health

## 2017-06-02 ENCOUNTER — Encounter: Payer: Self-pay | Admitting: Adult Health

## 2017-06-02 ENCOUNTER — Ambulatory Visit: Payer: 59 | Admitting: Family Medicine

## 2017-06-02 VITALS — BP 152/91 | HR 81 | Temp 98.0°F | Resp 16 | Ht 72.0 in | Wt 225.0 lb

## 2017-06-02 VITALS — BP 122/86 | HR 84 | Wt 228.6 lb

## 2017-06-02 DIAGNOSIS — R062 Wheezing: Secondary | ICD-10-CM | POA: Diagnosis not present

## 2017-06-02 DIAGNOSIS — G4733 Obstructive sleep apnea (adult) (pediatric): Secondary | ICD-10-CM

## 2017-06-02 DIAGNOSIS — J029 Acute pharyngitis, unspecified: Secondary | ICD-10-CM

## 2017-06-02 DIAGNOSIS — J22 Unspecified acute lower respiratory infection: Secondary | ICD-10-CM

## 2017-06-02 DIAGNOSIS — Z9989 Dependence on other enabling machines and devices: Secondary | ICD-10-CM

## 2017-06-02 DIAGNOSIS — Z5181 Encounter for therapeutic drug level monitoring: Secondary | ICD-10-CM | POA: Diagnosis not present

## 2017-06-02 DIAGNOSIS — R05 Cough: Secondary | ICD-10-CM | POA: Diagnosis not present

## 2017-06-02 DIAGNOSIS — R059 Cough, unspecified: Secondary | ICD-10-CM

## 2017-06-02 MED ORDER — ALBUTEROL SULFATE HFA 108 (90 BASE) MCG/ACT IN AERS: 1.0000 | INHALATION_SPRAY | RESPIRATORY_TRACT | 0 refills | Status: DC | PRN

## 2017-06-02 MED ORDER — AZITHROMYCIN 250 MG PO TABS: ORAL_TABLET | ORAL | 0 refills | Status: DC

## 2017-06-02 MED ORDER — BENZONATATE 100 MG PO CAPS: 100.0000 mg | ORAL_CAPSULE | Freq: Three times a day (TID) | ORAL | 0 refills | Status: DC | PRN

## 2017-06-02 NOTE — Progress Notes (Signed)
Subjective:    Patient ID: Robert Wise, male    DOB: 1961/03/15, 57 y.o.   MRN: [id number]  HPI Robert Wise is a 57 y.o. male Presents today for: Chief Complaint  Patient presents with  . Cough    x 3 weeks  . Sore Throat    x3 weeks   Stopped smoking February 6th. More cough since then. Cough has continued to worsen. Occasionally productive discolored mucus past few weeks. No fever. Sore throat past 1 week. Sore throat, cough, decreased energy main concerns. Started Lamictal for depression at similar time of symptoms.   No know sick contacts.   tx : otc flu med - unknown name - min relief. No recent antibiotics.   Also requesting CBC, B12, Folate for monitoring with new medicine - Lamictal. Larina Earthly at [facility], [phone]   Patient Active Problem List   Diagnosis Date Noted  . Uncontrolled type 2 diabetes mellitus with complication, without long-term current use of insulin ([place]) 02/10/2015  . Chronic pain 12/01/2014  . Depression 10/12/2014   Past Medical History:  Diagnosis Date  . Anxiety   . Depression   . Diabetes mellitus without complication ([place])   . Hyperlipidemia   . OSA (obstructive sleep apnea) 12/15/2012   Past Surgical History:  Procedure Laterality Date  . ANKLE SURGERY Left 2006  . VASECTOMY     Allergies  Allergen Reactions  . Tricor [Fenofibrate]   . Zocor [Simvastatin]    Prior to Admission medications   Medication Sig Start Date End Date Taking? Authorizing Provider  atorvastatin (LIPITOR) 40 MG tablet Take 1 tablet (40 mg total) by mouth daily. Office visit prior to refill. 04/11/17  Yes Jaynee Eagles, PA-C  Blood Glucose Monitoring Suppl (ONE TOUCH ULTRA SYSTEM KIT) W/DEVICE KIT 1 kit by Does not apply route once. 10/25/11  Yes Weber, Sarah L, PA-C  busPIRone (BUSPAR) 15 MG tablet 15 mg 2 (two) times daily. 05/28/17  Yes [provider]  Dapagliflozin-Metformin HCl ER (XIGDUO XR) 07-998 MG TB24 Take 2  tablets by mouth daily. 04/11/17  Yes Jaynee Eagles, PA-C  DULoxetine (CYMBALTA) 60 MG capsule TAKE 1 CAPSULE (60 MG TOTAL) BY MOUTH DAILY. 04/11/17  Yes Jaynee Eagles, PA-C  esomeprazole (NEXIUM) 40 MG capsule Take 1 capsule (40 mg total) by mouth daily. 04/11/17  Yes Jaynee Eagles, PA-C  ferrous sulfate 325 (65 FE) MG tablet Take 65 mg by mouth daily with breakfast.   Yes [provider]  fish oil-omega-3 fatty acids 1000 MG capsule Take 2 g by mouth daily.   Yes [provider]  glucose blood (ONE TOUCH ULTRA TEST) test strip 1 each by Other route daily. 12/01/14  Yes Jaynee Eagles, PA-C  lamoTRIgine (LAMICTAL) 25 MG tablet TAKE 1 TABLET DAILY FOR 2 WEEKS THEN 2 TABLETS DAILY 05/19/17  Yes [provider]  Lancets (ONETOUCH ULTRASOFT) lancets USE AS INSTRUCTED 11/28/13  Yes [name], MD  levothyroxine (SYNTHROID, LEVOTHROID) 150 MCG tablet Take 1 tablet (150 mcg total) by mouth daily before breakfast. 04/17/17  Yes Jaynee Eagles, PA-C  lisinopril (PRINIVIL,ZESTRIL) 5 MG tablet Take 1 tablet (5 mg total) by mouth daily. 04/11/17  Yes Jaynee Eagles, PA-C   Social History   Socioeconomic History  . Marital status: Married    Spouse name: [name]  . Number of children: 3  . Years of education: 11.5  . Highest education level: Not on file  Social Needs  . Financial resource strain: Not on  file  . Food insecurity - worry: Not on file  . Food insecurity - inability: Not on file  . Transportation needs - medical: Not on file  . Transportation needs - non-medical: Not on file  Occupational History    Employer: [employer]    Comment: Driver  Tobacco Use  . Smoking status: Former Smoker    Packs/day: 1.50    Years: 37.00    Pack years: 55.50    Types: Cigarettes    Last attempt to quit: 05/07/2017    Years since quitting: 0.0  . Smokeless tobacco: Never Used  Substance and Sexual Activity  . Alcohol use: No    Comment: quit: 08/14/2011  . Drug use: No  . Sexual activity:  Yes    Birth control/protection: None  Other Topics Concern  . Not on file  Social History Narrative   Patient lives at home with family.    Caffeine Use: 1 pot of coffee daily    Review of Systems  Constitutional: Negative for chills and fever.  Respiratory: Positive for cough and wheezing (few episodes. ). Negative for shortness of breath.    Per HPI.     Objective:   Physical Exam  Constitutional: He is oriented to person, place, and time. He appears well-developed and well-nourished.  HENT:  Head: Normocephalic and atraumatic.  Right Ear: Tympanic membrane, external ear and ear canal normal.  Left Ear: Tympanic membrane, external ear and ear canal normal.  Nose: No rhinorrhea.  Mouth/Throat: Oropharynx is clear and moist and mucous membranes are normal. No oropharyngeal exudate or posterior oropharyngeal erythema.  Eyes: Conjunctivae are normal. Pupils are equal, round, and reactive to light.  Neck: Neck supple.  Cardiovascular: Normal rate, regular rhythm, normal heart sounds and intact distal pulses.  No murmur heard. Pulmonary/Chest: Effort normal and breath sounds normal. He has no wheezes (Faint right lower, overall clear). He has no rhonchi. He has no rales.  Abdominal: Soft. There is no tenderness.  Lymphadenopathy:    He has no cervical adenopathy.  Neurological: He is alert and oriented to person, place, and time.  Skin: Skin is warm and dry. No rash noted.  Psychiatric: He has a normal mood and affect. His behavior is normal.  Vitals reviewed.  Vitals:   06/02/17 1340  BP: (!) 152/91  Pulse: 81  Resp: 16  Temp: 98 F (36.7 C)  TempSrc: Oral  SpO2: 97%  Weight: 225 lb (102.1 kg)  Height: 6' (1.829 m)       Assessment & Plan:    Robert Wise is a 57 y.o. male Cough - Plan: benzonatate (TESSALON) 100 MG capsule LRTI (lower respiratory tract infection) - Plan: azithromycin (ZITHROMAX) 250 MG tablet Sore throat Wheezing - Plan: albuterol  (PROVENTIL HFA;VENTOLIN HFA) 108 (90 Base) MCG/ACT inhaler  - Possible initial viral symptoms with persistent cough, secondary sickening. Afebrile, lungs overall clear except for very faint wheeze. suspected lower respiratory infection/bronchitis versus early community acquired pneumonia.  -Start Z-Pak, symptomatic care for sore throat and cough discussed, albuterol inhaler if needed, with RTC precautions if frequent or persistent use  -RTC precautions if symptoms are not improving with above  Medication monitoring encounter - Plan: CBC, B12 and Folate Panel  -Medication monitoring as requested by psychiatry, likely for Lamictal use.  Meds ordered this encounter  Medications  . benzonatate (TESSALON) 100 MG capsule    Sig: Take 1 capsule (100 mg total) by mouth 3 (three) times daily as needed for cough.  Dispense:  20 capsule    Refill:  0  . azithromycin (ZITHROMAX) 250 MG tablet    Sig: Take 2 pills by mouth on day 1, then 1 pill by mouth per day on days 2 through 5.    Dispense:  6 tablet    Refill:  0  . albuterol (PROVENTIL HFA;VENTOLIN HFA) 108 (90 Base) MCG/ACT inhaler    Sig: Inhale 1-2 puffs into the lungs every 4 (four) hours as needed for wheezing or shortness of breath.    Dispense:  1 Inhaler    Refill:  0   Patient Instructions   Tessalon or Mucinex for cough. Start antibiotic to cover for bronchitis/lower respiratory infection. If you are wheezing, try the inhaler up to every 4-6 hours. If you require that more than 2-3 times per day, or persistent requiring it more than 3 days, return for recheck. Make sure you drink plenty of fluids, sore throat lozenges such as Cepacol as needed.  Return to the clinic or go to the nearest emergency room if any of your symptoms worsen or new symptoms occur.  I will check the blood work as requested from your psychiatrist.  Thank you for coming in today.    How to Use a Metered Dose Inhaler A metered dose inhaler is a handheld  device for taking medicine that must be breathed into the lungs (inhaled). The device can be used to deliver a variety of inhaled medicines, including:  Quick relief or rescue medicines, such as bronchodilators.  Controller medicines, such as corticosteroids.  The medicine is delivered by pushing down on a metal canister to release a preset amount of spray and medicine. Each device contains the amount of medicine that is needed for a preset number of uses (inhalations). Your health care provider may recommend that you use a spacer with your inhaler to help you take the medicine more effectively. A spacer is a plastic tube with a mouthpiece on one end and an opening that connects to the inhaler on the other end. A spacer holds the medicine in a tube for a short time, which allows you to inhale more medicine. What are the risks? If you do not use your inhaler correctly, medicine might not reach your lungs to help you breathe. Inhaler medicine can cause side effects, such as:  Mouth or throat infection.  Cough.  Hoarseness.  Headache.  Nausea and vomiting.  Lung infection (pneumonia) in people who have a lung condition called COPD.  How to use a metered dose inhaler without a spacer 1. Remove the cap from the inhaler. 2. If you are using the inhaler for the first time, shake it for 5 seconds, turn it away from your face, then release 4 puffs into the air. This is called priming. 3. Shake the inhaler for 5 seconds. 4. Position the inhaler so the top of the canister faces up. 5. Put your index finger on the top of the medicine canister. Support the bottom of the inhaler with your thumb. 6. Breathe out normally and as completely as possible, away from the inhaler. 7. Either place the inhaler between your teeth and close your lips tightly around the mouthpiece, or hold the inhaler 1-2 inches (2.5-5 cm) away from your open mouth. Keep your tongue down out of the way. If you are unsure which  technique to use, ask your health care provider. 8. Press the canister down with your index finger to release the medicine, then inhale deeply and slowly  through your mouth (not your nose) until your lungs are completely filled. Inhaling should take 4-6 seconds. 9. Hold the medicine in your lungs for 5-10 seconds (10 seconds is best). This helps the medicine get into the small airways of your lungs. 10. With your lips in a tight circle (pursed), breathe out slowly. 11. Repeat steps 3-10 until you have taken the number of puffs that your health care provider directed. Wait about 1 minute between puffs or as directed. 12. Put the cap on the inhaler. 13. If you are using a steroid inhaler, rinse your mouth with water, gargle, and spit out the water. Do not swallow the water. How to use a metered dose inhaler with a spacer 1. Remove the cap from the inhaler. 2. If you are using the inhaler for the first time, shake it for 5 seconds, turn it away from your face, then release 4 puffs into the air. This is called priming. 3. Shake the inhaler for 5 seconds. 4. Place the open end of the spacer onto the inhaler mouthpiece. 5. Position the inhaler so the top of the canister faces up and the spacer mouthpiece faces you. 6. Put your index finger on the top of the medicine canister. Support the bottom of the inhaler and the spacer with your thumb. 7. Breathe out normally and as completely as possible, away from the spacer. 8. Place the spacer between your teeth and close your lips tightly around it. Keep your tongue down out of the way. 9. Press the canister down with your index finger to release the medicine, then inhale deeply and slowly through your mouth (not your nose) until your lungs are completely filled. Inhaling should take 4-6 seconds. 10. Hold the medicine in your lungs for 5-10 seconds (10 seconds is best). This helps the medicine get into the small airways of your lungs. 11. With your lips in a  tight circle (pursed), breathe out slowly. 12. Repeat steps 3-11 until you have taken the number of puffs that your health care provider directed. Wait about 1 minute between puffs or as directed. 13. Remove the spacer from the inhaler and put the cap on the inhaler. 14. If you are using a steroid inhaler, rinse your mouth with water, gargle, and spit out the water. Do not swallow the water. Follow these instructions at home:  Take your inhaled medicine only as told by your health care provider. Do not use the inhaler more than directed by your health care provider.  Keep all follow-up visits as told by your health care provider. This is important.  If your inhaler has a counter, you can check it to determine how full your inhaler is. If your inhaler does not have a counter, ask your health care provider when you will need to refill your inhaler and write the refill date on a calendar or on your inhaler canister. Note that you cannot know when an inhaler is empty by shaking it.  Follow directions on the package insert for care and cleaning of your inhaler and spacer. Contact a health care provider if:  Symptoms are only partially relieved with your inhaler.  You are having trouble using your inhaler.  You have an increase in phlegm.  You have headaches. Get help right away if:  You feel little or no relief after using your inhaler.  You have dizziness.  You have a fast heart rate.  You have chills or a fever.  You have night sweats.  There  is blood in your phlegm. Summary  A metered dose inhaler is a handheld device for taking medicine that must be breathed into the lungs (inhaled).  The medicine is delivered by pushing down on a metal canister to release a preset amount of spray and medicine.  Each device contains the amount of medicine that is needed for a preset number of uses (inhalations). This information is not intended to replace advice given to you by your health  care provider. Make sure you discuss any questions you have with your health care provider. Document Released: 03/18/2005 Document Revised: 02/06/2016 Document Reviewed: 02/06/2016 Elsevier Interactive Patient Education  2017 [employer]  Sore Throat A sore throat is pain, burning, irritation, or scratchiness in the throat. When you have a sore throat, you may feel pain or tenderness in your throat when you swallow or talk. Many things can cause a sore throat, including:  An infection.  Seasonal allergies.  Dryness in the air.  Irritants, such as smoke or pollution.  Gastroesophageal reflux disease (GERD).  A tumor.  A sore throat is often the first sign of another sickness. It may happen with other symptoms, such as coughing, sneezing, fever, and swollen neck glands. Most sore throats go away without medical treatment. Follow these instructions at home:  Take over-the-counter medicines only as told by your health care provider.  Drink enough fluids to keep your urine clear or pale yellow.  Rest as needed.  To help with pain, try: ? Sipping warm liquids, such as broth, herbal tea, or warm water. ? Eating or drinking cold or frozen liquids, such as frozen ice pops. ? Gargling with a salt-water mixture 3-4 times a day or as needed. To make a salt-water mixture, completely dissolve -1 tsp of salt in 1 cup of warm water. ? Sucking on hard candy or throat lozenges. ? Putting a cool-mist humidifier in your bedroom at night to moisten the air. ? Sitting in the bathroom with the door closed for 5-10 minutes while you run hot water in the shower.  Do not use any tobacco products, such as cigarettes, chewing tobacco, and e-cigarettes. If you need help quitting, ask your health care provider. Contact a health care provider if:  You have a fever for more than 2-3 days.  You have symptoms that last (are persistent) for more than 2-3 days.  Your throat does not get better within 7  days.  You have a fever and your symptoms suddenly get worse. Get help right away if:  You have difficulty breathing.  You cannot swallow fluids, soft foods, or your saliva.  You have increased swelling in your throat or neck.  You have persistent nausea and vomiting. This information is not intended to replace advice given to you by your health care provider. Make sure you discuss any questions you have with your health care provider. Document Released: 04/25/2004 Document Revised: 11/12/2015 Document Reviewed: 01/06/2015 Elsevier Interactive Patient Education  2018 [employer].  Cough, Adult Coughing is a reflex that clears your throat and your airways. Coughing helps to heal and protect your lungs. It is normal to cough occasionally, but a cough that happens with other symptoms or lasts a long time may be a sign of a condition that needs treatment. A cough may last only 2-3 weeks (acute), or it may last longer than 8 weeks (chronic). What are the causes? Coughing is commonly caused by:  Breathing in substances that irritate your lungs.  A viral or bacterial  respiratory infection.  Allergies.  Asthma.  Postnasal drip.  Smoking.  Acid backing up from the stomach into the esophagus (gastroesophageal reflux).  Certain medicines.  Chronic lung problems, including COPD (or rarely, lung cancer).  Other medical conditions such as heart failure.  Follow these instructions at home: Pay attention to any changes in your symptoms. Take these actions to help with your discomfort:  Take medicines only as told by your health care provider. ? If you were prescribed an antibiotic medicine, take it as told by your health care provider. Do not stop taking the antibiotic even if you start to feel better. ? Talk with your health care provider before you take a cough suppressant medicine.  Drink enough fluid to keep your urine clear or pale yellow.  If the air is dry, use a cold steam  vaporizer or humidifier in your bedroom or your home to help loosen secretions.  Avoid anything that causes you to cough at work or at home.  If your cough is worse at night, try sleeping in a semi-upright position.  Avoid cigarette smoke. If you smoke, quit smoking. If you need help quitting, ask your health care provider.  Avoid caffeine.  Avoid alcohol.  Rest as needed.  Contact a health care provider if:  You have new symptoms.  You cough up pus.  Your cough does not get better after 2-3 weeks, or your cough gets worse.  You cannot control your cough with suppressant medicines and you are losing sleep.  You develop pain that is getting worse or pain that is not controlled with pain medicines.  You have a fever.  You have unexplained weight loss.  You have night sweats. Get help right away if:  You cough up blood.  You have difficulty breathing.  Your heartbeat is very fast. This information is not intended to replace advice given to you by your health care provider. Make sure you discuss any questions you have with your health care provider. Document Released: 09/14/2010 Document Revised: 08/24/2015 Document Reviewed: 05/25/2014 Elsevier Interactive Patient Education  2018 [employer].     IF you received an x-ray today, you will receive an invoice from [facility]. Please contact [facility] at [phone] with questions or concerns regarding your invoice.   IF you received labwork today, you will receive an invoice from [facility]. Please contact [facility] at [phone] with questions or concerns regarding your invoice.   Our billing staff will not be able to assist you with questions regarding bills from these companies.  You will be contacted with the lab results as soon as they are available. The fastest way to get your results is to activate your My Chart account. Instructions are located on the last page of this paperwork. If you have  not heard from [place] regarding the results in 2 weeks, please contact this office.      Signed,   Merri Ray, MD [facility] [place].  06/02/17 2:13 PM

## 2017-06-02 NOTE — Progress Notes (Signed)
DME orders for CPAP- mask refitting and new supplies [facility], .

## 2017-06-02 NOTE — Progress Notes (Addendum)
PATIENT: Robert Wise DOB: 10/09/1960  REASON FOR VISIT: follow up HISTORY FROM: patient  HISTORY OF PRESENT ILLNESS: Today 06/02/17 Robert Wise is a 57 year old male with a history of obstructive sleep apnea on CPAP.  He returns today for an evaluation.  His CPAP download indicates that he use his machine 29 out of 30 days for compliance of 97%.  He uses machine greater than 4 hours 26 out of 30 days for compliance of 87%.  On average he uses it 6 hours and 49 minutes.  His residual AHI is 2.6 on 14 cm of water with EPR 1.  The patient has a leak in the 95th percentile at 47.6 L/min.  The patient states that he has never changed out his supplies.  Reports that he has had to tighten his mask because he can feel it leaking.  Overall he feels that the CPAP has been beneficial.  He returns today for an evaluation.  HISTORY  11/28/2016  I reviewed his CPAP compliance data from 10/28/2016 through 11/26/2016 which is a total of 30 days, during which time he used his machine every night with percent used days greater than 4 hours at 100%, indicating superb compliance with an average usage of 6 hours and 37 minutes, residual AHI 4.8 per hour, leak high with the 95th percentile at 52.1 L/m on a pressure of 14 cm with EPR of 1. He reports that he is feeling better since starting CPAP. He is indeed fully compliant, he has a tendency towards mouth breathing. He reports otherwise feeling more rested. He goes to [place] for mental health and sees [employer]. He is still on Abilify and also started sample of Rexulti, which has helped he feels.   The patient's allergies, current medications, family history, past medical history, past social history, past surgical history and problem list were reviewed and updated as appropriate.   REVIEW OF SYSTEMS: Out of a complete 14 system review of symptoms, the patient complains only of the following symptoms, and all other reviewed systems are  negative.  Excessive sweating, cough  Epworth sleepiness score 5  ALLERGIES: Allergies  Allergen Reactions  . Tricor [Fenofibrate]   . Zocor [Simvastatin]     HOME MEDICATIONS: Outpatient Medications Prior to Visit  Medication Sig Dispense Refill  . atorvastatin (LIPITOR) 40 MG tablet Take 1 tablet (40 mg total) by mouth daily. Office visit prior to refill. 90 tablet 3  . Blood Glucose Monitoring Suppl (ONE TOUCH ULTRA SYSTEM KIT) W/DEVICE KIT 1 kit by Does not apply route once. 1 each 0  . busPIRone (BUSPAR) 15 MG tablet 15 mg 2 (two) times daily.    . Dapagliflozin-Metformin HCl ER (XIGDUO XR) 07-998 MG TB24 Take 2 tablets by mouth daily. 180 tablet 1  . DULoxetine (CYMBALTA) 60 MG capsule TAKE 1 CAPSULE (60 MG TOTAL) BY MOUTH DAILY. 90 capsule 3  . esomeprazole (NEXIUM) 40 MG capsule Take 1 capsule (40 mg total) by mouth daily. 90 capsule 3  . fish oil-omega-3 fatty acids 1000 MG capsule Take 2 g by mouth daily.    Marland Kitchen glucose blood (ONE TOUCH ULTRA TEST) test strip 1 each by Other route daily. 100 each 8  . lamoTRIgine (LAMICTAL) 25 MG tablet TAKE 1 TABLET DAILY FOR 2 WEEKS THEN 2 TABLETS DAILY  1  . Lancets (ONETOUCH ULTRASOFT) lancets USE AS INSTRUCTED 100 each 0  . levothyroxine (SYNTHROID, LEVOTHROID) 150 MCG tablet Take 1 tablet (150 mcg total) by mouth daily  before breakfast. 90 tablet 3  . lisinopril (PRINIVIL,ZESTRIL) 5 MG tablet Take 1 tablet (5 mg total) by mouth daily. 90 tablet 3  . busPIRone (BUSPAR) 7.5 MG tablet Take 1 tablet (7.5 mg total) by mouth 2 (two) times daily. 180 tablet 1  . nicotine (NICODERM CQ) 21 mg/24hr patch Place 1 patch (21 mg total) onto the skin daily. 42 patch 0   No facility-administered medications prior to visit.     PAST MEDICAL HISTORY: Past Medical History:  Diagnosis Date  . Anxiety   . Depression   . Diabetes mellitus without complication ([place])   . Hyperlipidemia   . OSA (obstructive sleep apnea) 12/15/2012    PAST SURGICAL  HISTORY: Past Surgical History:  Procedure Laterality Date  . ANKLE SURGERY Left 2006  . VASECTOMY      FAMILY HISTORY: No family history on file.  SOCIAL HISTORY: Social History   Socioeconomic History  . Marital status: Married    Spouse name: [name]  . Number of children: 3  . Years of education: 11.5  . Highest education level: Not on file  Social Needs  . Financial resource strain: Not on file  . Food insecurity - worry: Not on file  . Food insecurity - inability: Not on file  . Transportation needs - medical: Not on file  . Transportation needs - non-medical: Not on file  Occupational History    Employer: [employer]    Comment: Driver  Tobacco Use  . Smoking status: Former Smoker    Packs/day: 1.50    Years: 37.00    Pack years: 55.50    Types: Cigarettes    Last attempt to quit: 05/07/2017    Years since quitting: 0.0  . Smokeless tobacco: Never Used  Substance and Sexual Activity  . Alcohol use: No    Comment: quit: 08/14/2011  . Drug use: No  . Sexual activity: Yes    Birth control/protection: None  Other Topics Concern  . Not on file  Social History Narrative   Patient lives at home with family.    Caffeine Use: 1 pot of coffee daily      PHYSICAL EXAM  Vitals:   06/02/17 0857  BP: 122/86  Pulse: 84  Weight: 228 lb 9.6 oz (103.7 kg)   Body mass index is 30.58 kg/m.  Generalized: Well developed, in no acute distress   Neurological examination  Mentation: Alert oriented to time, place, history taking. Follows all commands speech and language fluent Cranial nerve II-XII: Pupils were equal round reactive to light. Extraocular movements were full, visual field were full on confrontational test. Facial sensation and strength were normal. Uvula tongue midline. Head turning and shoulder shrug  were normal and symmetric.  Neck circumference 18 inches Motor: The motor testing reveals 5 over 5 strength of all 4 extremities. Good symmetric motor tone  is noted throughout.  Sensory: Sensory testing is intact to soft touch on all 4 extremities. No evidence of extinction is noted.  Coordination: Cerebellar testing reveals good finger-nose-finger and heel-to-shin bilaterally.  Gait and station: Gait is normal. Tandem gait is normal. Romberg is negative. No drift is seen.  Reflexes: Deep tendon reflexes are symmetric and normal bilaterally.   DIAGNOSTIC DATA (LABS, IMAGING, TESTING) - I reviewed patient records, labs, notes, testing and imaging myself where available.  Lab Results  Component Value Date   WBC 8.6 07/28/2015   HGB 15.5 07/28/2015   HCT 43.0 (A) 07/28/2015   MCV 81.8 07/28/2015  PLT 208 09/24/2013      Component Value Date/Time   NA 141 04/11/2017 1149   K 4.4 04/11/2017 1149   CL 101 04/11/2017 1149   CO2 22 04/11/2017 1149   GLUCOSE 193 (H) 04/11/2017 1149   GLUCOSE 136 (H) 12/29/2015 0909   BUN 13 04/11/2017 1149   CREATININE 0.85 04/11/2017 1149   CREATININE 0.72 12/29/2015 0909   CALCIUM 9.8 04/11/2017 1149   PROT 7.2 04/11/2017 1149   ALBUMIN 4.5 04/11/2017 1149   AST 12 04/11/2017 1149   ALT 16 04/11/2017 1149   ALKPHOS 79 04/11/2017 1149   BILITOT 0.3 04/11/2017 1149   GFRNONAA 97 04/11/2017 1149   GFRNONAA >89 07/28/2015 0847   GFRAA 113 04/11/2017 1149   GFRAA >89 07/28/2015 0847   Lab Results  Component Value Date   CHOL 155 04/11/2017   HDL 29 (L) 04/11/2017   LDLCALC 89 04/11/2017   TRIG 187 (H) 04/11/2017   CHOLHDL 5.3 (H) 04/11/2017   Lab Results  Component Value Date   HGBA1C 8.4 04/11/2017   No results found for: [id number] Lab Results  Component Value Date   TSH 0.901 04/11/2017      ASSESSMENT AND PLAN 57 y.o. year old male  has a past medical history of Anxiety, Depression, Diabetes mellitus without complication ([place]), Hyperlipidemia, and OSA (obstructive sleep apnea) (12/15/2012). here with:  1.  Obstructive sleep apnea on CPAP  The patient's download shows good  compliance however he has a high leak.  The patient has also never changed out his CPAP supplies.  I will send an order to his DME company requesting new supplies and a mask refitting.  The patient is advised that if his mask continues to leak he should let us know.  He will follow-up in 6 months or sooner if needed.  I spent 15 minutes with the patient. 50% of this time was spent reviewing his CPAP download    Robert Givens, MSN, NP-C 06/02/2017, 9:32 AM [facility] [address], [place],  [address] [phone]  I reviewed the above note and documentation by the Nurse Practitioner and agree with the history, physical exam, assessment and plan as outlined above. I was immediately available for face-to-face consultation. Star Age, MD, PhD [facility])

## 2017-06-02 NOTE — Patient Instructions (Signed)
Your Plan:  Continue using cpap nightly New supplies and mask refitting ordered   Please call [facility] at [phone], extension [phone] to discuss your concerns. Their customer service representatives will be glad to assist you. If they do not answer, please leave a message and they will call you back. Make sure to leave your name and return phone number. If you do not get a response from them in 3 business days, please let our office know.   Thank you for [facility]. I hope we have been able to provide you high quality care today.  You may receive a patient satisfaction survey over the next few weeks. We would appreciate your feedback and comments so that we may continue to improve ourselves and the health of our patients.

## 2017-06-02 NOTE — Patient Instructions (Addendum)
Tessalon or Mucinex for cough. Start antibiotic to cover for bronchitis/lower respiratory infection. If you are wheezing, try the inhaler up to every 4-6 hours. If you require that more than 2-3 times per day, or persistent requiring it more than 3 days, return for recheck. Make sure you drink plenty of fluids, sore throat lozenges such as Cepacol as needed.  Return to the clinic or go to the nearest emergency room if any of your symptoms worsen or new symptoms occur.  I will check the blood work as requested from your psychiatrist.  Thank you for coming in today.    How to Use a Metered Dose Inhaler A metered dose inhaler is a handheld device for taking medicine that must be breathed into the lungs (inhaled). The device can be used to deliver a variety of inhaled medicines, including:  Quick relief or rescue medicines, such as bronchodilators.  Controller medicines, such as corticosteroids.  The medicine is delivered by pushing down on a metal canister to release a preset amount of spray and medicine. Each device contains the amount of medicine that is needed for a preset number of uses (inhalations). Your health care provider may recommend that you use a spacer with your inhaler to help you take the medicine more effectively. A spacer is a plastic tube with a mouthpiece on one end and an opening that connects to the inhaler on the other end. A spacer holds the medicine in a tube for a short time, which allows you to inhale more medicine. What are the risks? If you do not use your inhaler correctly, medicine might not reach your lungs to help you breathe. Inhaler medicine can cause side effects, such as:  Mouth or throat infection.  Cough.  Hoarseness.  Headache.  Nausea and vomiting.  Lung infection (pneumonia) in people who have a lung condition called COPD.  How to use a metered dose inhaler without a spacer 1. Remove the cap from the inhaler. 2. If you are using the inhaler  for the first time, shake it for 5 seconds, turn it away from your face, then release 4 puffs into the air. This is called priming. 3. Shake the inhaler for 5 seconds. 4. Position the inhaler so the top of the canister faces up. 5. Put your index finger on the top of the medicine canister. Support the bottom of the inhaler with your thumb. 6. Breathe out normally and as completely as possible, away from the inhaler. 7. Either place the inhaler between your teeth and close your lips tightly around the mouthpiece, or hold the inhaler 1-2 inches (2.5-5 cm) away from your open mouth. Keep your tongue down out of the way. If you are unsure which technique to use, ask your health care provider. 8. Press the canister down with your index finger to release the medicine, then inhale deeply and slowly through your mouth (not your nose) until your lungs are completely filled. Inhaling should take 4-6 seconds. 9. Hold the medicine in your lungs for 5-10 seconds (10 seconds is best). This helps the medicine get into the small airways of your lungs. 10. With your lips in a tight circle (pursed), breathe out slowly. 11. Repeat steps 3-10 until you have taken the number of puffs that your health care provider directed. Wait about 1 minute between puffs or as directed. 12. Put the cap on the inhaler. 13. If you are using a steroid inhaler, rinse your mouth with water, gargle, and spit out the  water. Do not swallow the water. How to use a metered dose inhaler with a spacer 1. Remove the cap from the inhaler. 2. If you are using the inhaler for the first time, shake it for 5 seconds, turn it away from your face, then release 4 puffs into the air. This is called priming. 3. Shake the inhaler for 5 seconds. 4. Place the open end of the spacer onto the inhaler mouthpiece. 5. Position the inhaler so the top of the canister faces up and the spacer mouthpiece faces you. 6. Put your index finger on the top of the medicine  canister. Support the bottom of the inhaler and the spacer with your thumb. 7. Breathe out normally and as completely as possible, away from the spacer. 8. Place the spacer between your teeth and close your lips tightly around it. Keep your tongue down out of the way. 9. Press the canister down with your index finger to release the medicine, then inhale deeply and slowly through your mouth (not your nose) until your lungs are completely filled. Inhaling should take 4-6 seconds. 10. Hold the medicine in your lungs for 5-10 seconds (10 seconds is best). This helps the medicine get into the small airways of your lungs. 11. With your lips in a tight circle (pursed), breathe out slowly. 12. Repeat steps 3-11 until you have taken the number of puffs that your health care provider directed. Wait about 1 minute between puffs or as directed. 13. Remove the spacer from the inhaler and put the cap on the inhaler. 14. If you are using a steroid inhaler, rinse your mouth with water, gargle, and spit out the water. Do not swallow the water. Follow these instructions at home:  Take your inhaled medicine only as told by your health care provider. Do not use the inhaler more than directed by your health care provider.  Keep all follow-up visits as told by your health care provider. This is important.  If your inhaler has a counter, you can check it to determine how full your inhaler is. If your inhaler does not have a counter, ask your health care provider when you will need to refill your inhaler and write the refill date on a calendar or on your inhaler canister. Note that you cannot know when an inhaler is empty by shaking it.  Follow directions on the package insert for care and cleaning of your inhaler and spacer. Contact a health care provider if:  Symptoms are only partially relieved with your inhaler.  You are having trouble using your inhaler.  You have an increase in phlegm.  You have  headaches. Get help right away if:  You feel little or no relief after using your inhaler.  You have dizziness.  You have a fast heart rate.  You have chills or a fever.  You have night sweats.  There is blood in your phlegm. Summary  A metered dose inhaler is a handheld device for taking medicine that must be breathed into the lungs (inhaled).  The medicine is delivered by pushing down on a metal canister to release a preset amount of spray and medicine.  Each device contains the amount of medicine that is needed for a preset number of uses (inhalations). This information is not intended to replace advice given to you by your health care provider. Make sure you discuss any questions you have with your health care provider. Document Released: 03/18/2005 Document Revised: 02/06/2016 Document Reviewed: 02/06/2016 Elsevier Interactive Patient  Education  2017 Elsevier Inc.  Sore Throat A sore throat is pain, burning, irritation, or scratchiness in the throat. When you have a sore throat, you may feel pain or tenderness in your throat when you swallow or talk. Many things can cause a sore throat, including:  An infection.  Seasonal allergies.  Dryness in the air.  Irritants, such as smoke or pollution.  Gastroesophageal reflux disease (GERD).  A tumor.  A sore throat is often the first sign of another sickness. It may happen with other symptoms, such as coughing, sneezing, fever, and swollen neck glands. Most sore throats go away without medical treatment. Follow these instructions at home:  Take over-the-counter medicines only as told by your health care provider.  Drink enough fluids to keep your urine clear or pale yellow.  Rest as needed.  To help with pain, try: ? Sipping warm liquids, such as broth, herbal tea, or warm water. ? Eating or drinking cold or frozen liquids, such as frozen ice pops. ? Gargling with a salt-water mixture 3-4 times a day or as needed.  To make a salt-water mixture, completely dissolve -1 tsp of salt in 1 cup of warm water. ? Sucking on hard candy or throat lozenges. ? Putting a cool-mist humidifier in your bedroom at night to moisten the air. ? Sitting in the bathroom with the door closed for 5-10 minutes while you run hot water in the shower.  Do not use any tobacco products, such as cigarettes, chewing tobacco, and e-cigarettes. If you need help quitting, ask your health care provider. Contact a health care provider if:  You have a fever for more than 2-3 days.  You have symptoms that last (are persistent) for more than 2-3 days.  Your throat does not get better within 7 days.  You have a fever and your symptoms suddenly get worse. Get help right away if:  You have difficulty breathing.  You cannot swallow fluids, soft foods, or your saliva.  You have increased swelling in your throat or neck.  You have persistent nausea and vomiting. This information is not intended to replace advice given to you by your health care provider. Make sure you discuss any questions you have with your health care provider. Document Released: 04/25/2004 Document Revised: 11/12/2015 Document Reviewed: 01/06/2015 Elsevier Interactive Patient Education  2018 Elsevier Inc.  Cough, Adult Coughing is a reflex that clears your throat and your airways. Coughing helps to heal and protect your lungs. It is normal to cough occasionally, but a cough that happens with other symptoms or lasts a long time may be a sign of a condition that needs treatment. A cough may last only 2-3 weeks (acute), or it may last longer than 8 weeks (chronic). What are the causes? Coughing is commonly caused by:  Breathing in substances that irritate your lungs.  A viral or bacterial respiratory infection.  Allergies.  Asthma.  Postnasal drip.  Smoking.  Acid backing up from the stomach into the esophagus (gastroesophageal reflux).  Certain  medicines.  Chronic lung problems, including COPD (or rarely, lung cancer).  Other medical conditions such as heart failure.  Follow these instructions at home: Pay attention to any changes in your symptoms. Take these actions to help with your discomfort:  Take medicines only as told by your health care provider. ? If you were prescribed an antibiotic medicine, take it as told by your health care provider. Do not stop taking the antibiotic even if you start to feel  better. ? Talk with your health care provider before you take a cough suppressant medicine.  Drink enough fluid to keep your urine clear or pale yellow.  If the air is dry, use a cold steam vaporizer or humidifier in your bedroom or your home to help loosen secretions.  Avoid anything that causes you to cough at work or at home.  If your cough is worse at night, try sleeping in a semi-upright position.  Avoid cigarette smoke. If you smoke, quit smoking. If you need help quitting, ask your health care provider.  Avoid caffeine.  Avoid alcohol.  Rest as needed.  Contact a health care provider if:  You have new symptoms.  You cough up pus.  Your cough does not get better after 2-3 weeks, or your cough gets worse.  You cannot control your cough with suppressant medicines and you are losing sleep.  You develop pain that is getting worse or pain that is not controlled with pain medicines.  You have a fever.  You have unexplained weight loss.  You have night sweats. Get help right away if:  You cough up blood.  You have difficulty breathing.  Your heartbeat is very fast. This information is not intended to replace advice given to you by your health care provider. Make sure you discuss any questions you have with your health care provider. Document Released: 09/14/2010 Document Revised: 08/24/2015 Document Reviewed: 05/25/2014 Elsevier Interactive Patient Education  2018 [employer].     IF you  received an x-ray today, you will receive an invoice from [facility]. Please contact [facility] at [phone] with questions or concerns regarding your invoice.   IF you received labwork today, you will receive an invoice from [facility]. Please contact [facility] at [phone] with questions or concerns regarding your invoice.   Our billing staff will not be able to assist you with questions regarding bills from these companies.  You will be contacted with the lab results as soon as they are available. The fastest way to get your results is to activate your My Chart account. Instructions are located on the last page of this paperwork. If you have not heard from [place] regarding the results in 2 weeks, please contact this office.

## 2017-06-03 LAB — CBC
Hematocrit: 42.6 % (ref 37.5–51.0)
Hemoglobin: 14.7 g/dL (ref 13.0–17.7)
MCH: 27.8 pg (ref 26.6–33.0)
MCHC: 34.5 g/dL (ref 31.5–35.7)
MCV: 81 fL (ref 79–97)
Platelets: 253 10*3/uL (ref 150–379)
RBC: 5.28 x10E6/uL (ref 4.14–5.80)
RDW: 16.2 % — ABNORMAL HIGH (ref 12.3–15.4)
WBC: 9.8 10*3/uL (ref 3.4–10.8)

## 2017-06-03 LAB — B12 AND FOLATE PANEL
Folate: 10.3 ng/mL (ref >3.0)
Vitamin B-12: 747 pg/mL (ref 232–1245)

## 2017-06-04 ENCOUNTER — Encounter: Payer: Self-pay | Admitting: Family Medicine

## 2017-06-13 IMAGING — CR DG FINGER MIDDLE 2+V*R*
3 series · 3 of 3 positions shown · non-contrast
Comparison: None.

CLINICAL DATA: Right hand injury and right middle finger pain,
initial encounter.

EXAM:
RIGHT MIDDLE FINGER 2+V

[PA]
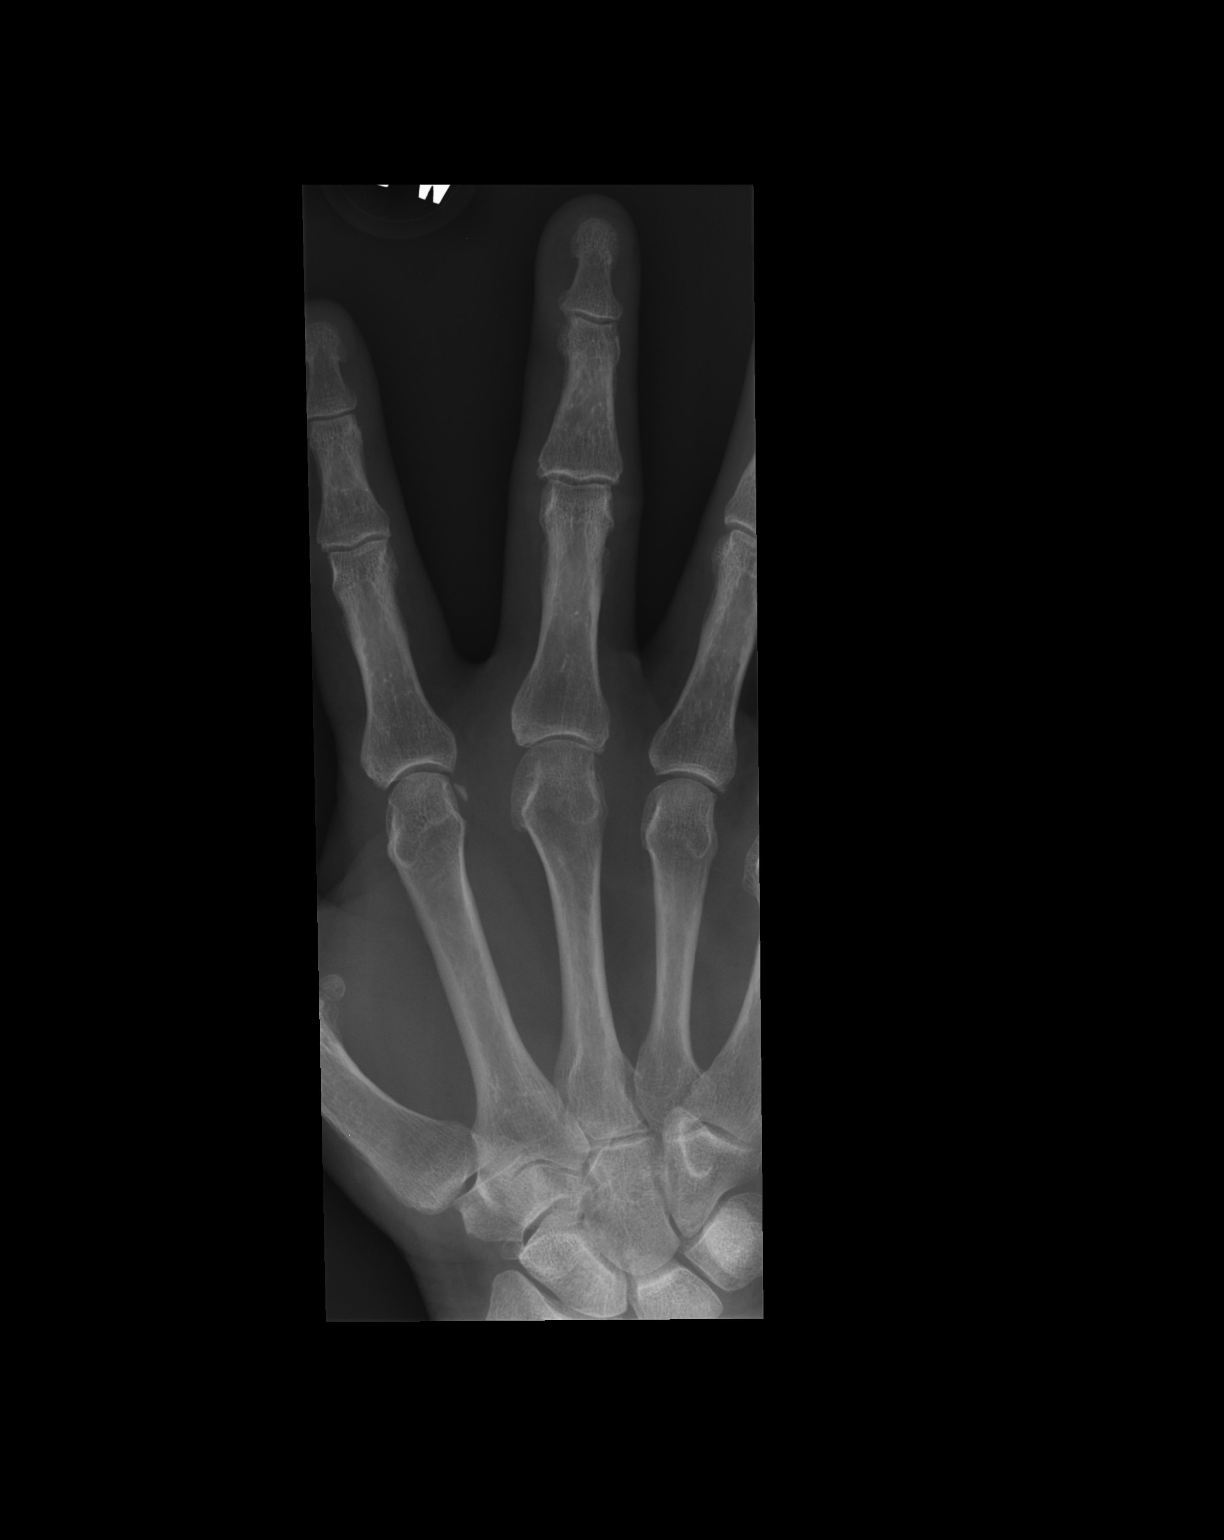

[pa obl]
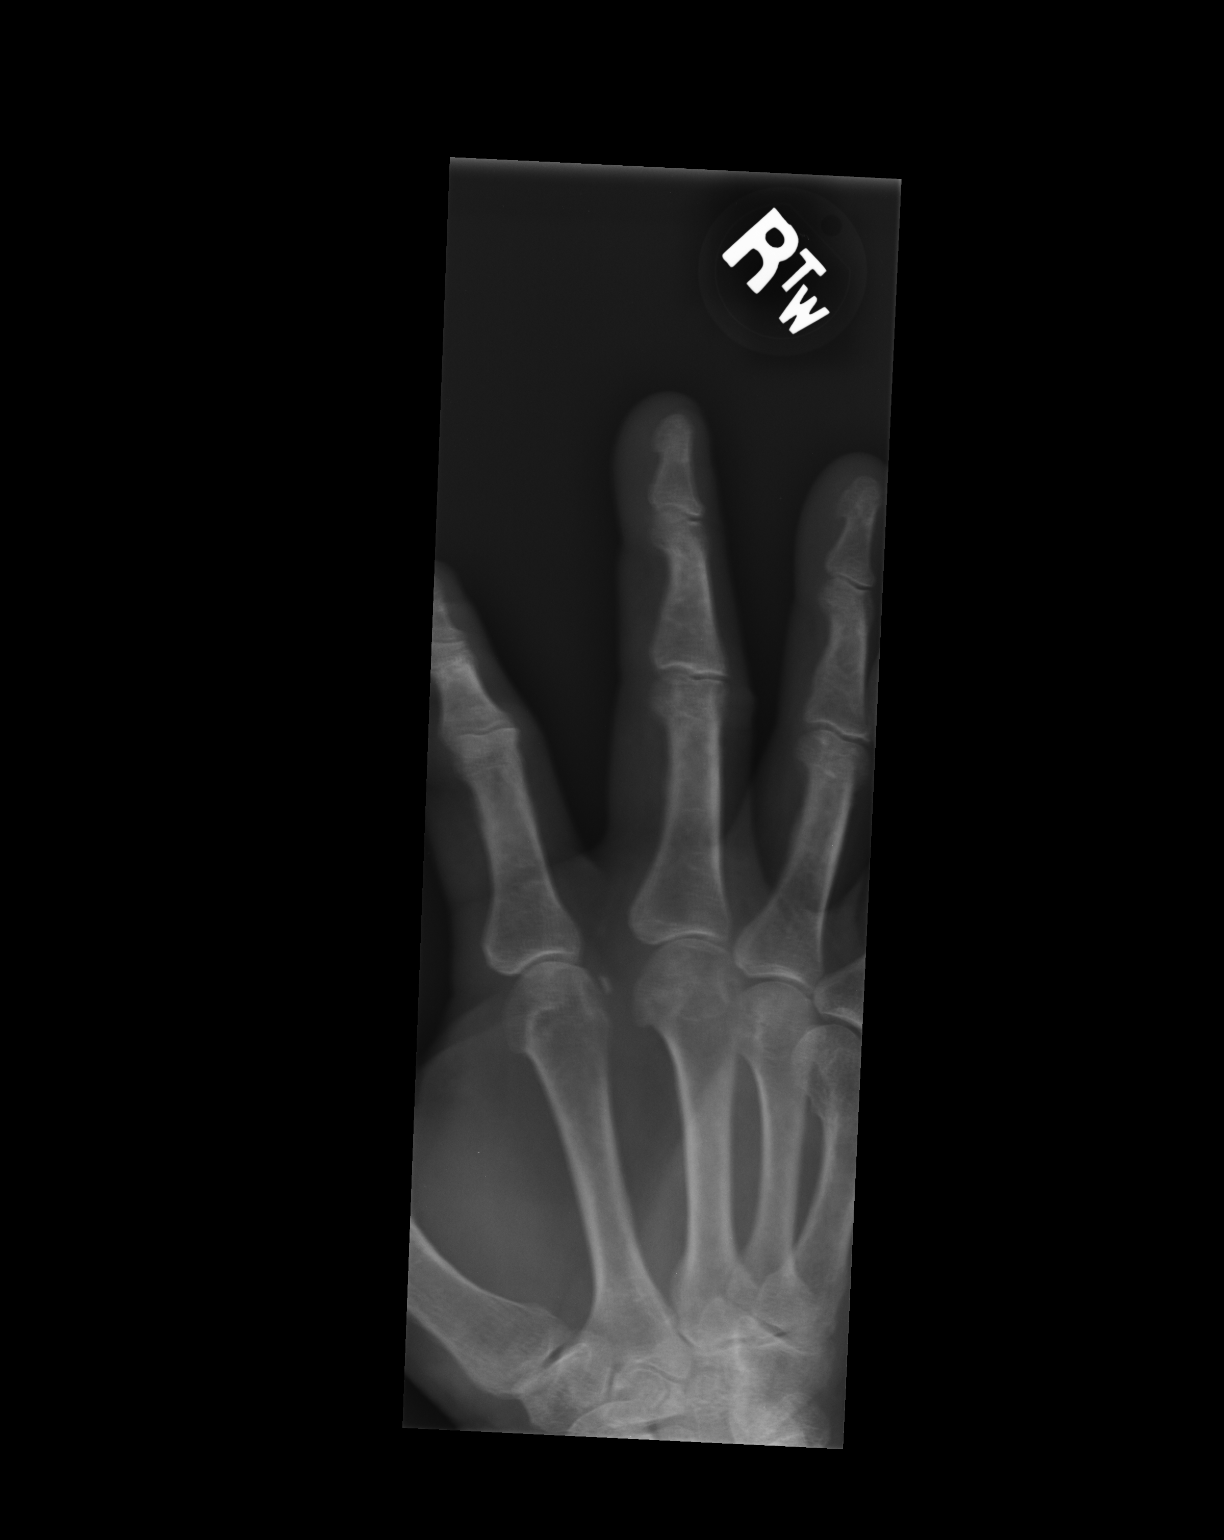

[lateral]
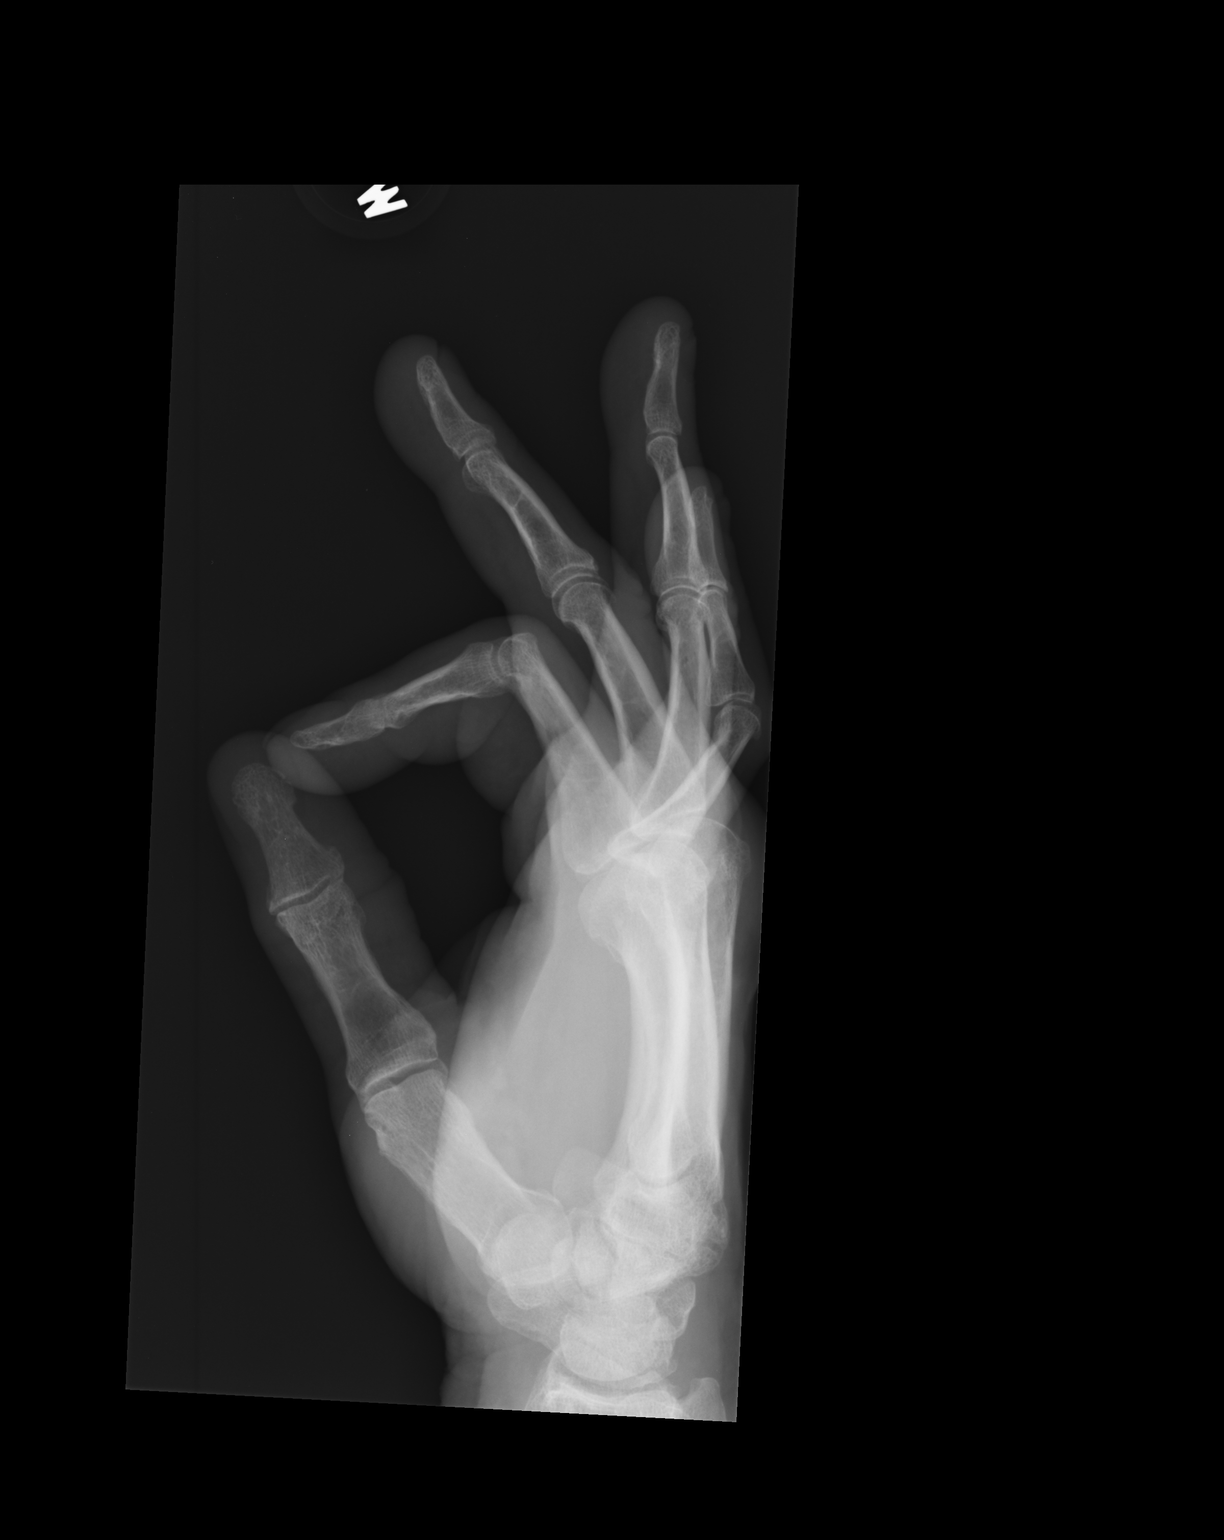

[3 of 3 positions shown; findings below may reference images not displayed]

FINDINGS: There is no evidence of fracture or dislocation. There is no
evidence of arthropathy or other focal bone abnormality. Soft
tissues are unremarkable.
IMPRESSION: Normal right middle finger.

## 2017-06-29 ENCOUNTER — Other Ambulatory Visit: Payer: Self-pay | Admitting: Family Medicine

## 2017-06-29 DIAGNOSIS — R062 Wheezing: Secondary | ICD-10-CM

## 2017-06-30 NOTE — Telephone Encounter (Signed)
Rx refill request- short term Rx 06/02/17 Albuterol inhaler  LOV: 06/02/17  PCP: Urban GibsonMani  Pharmacy: verified

## 2017-07-09 ENCOUNTER — Encounter: Payer: Self-pay | Admitting: Physician Assistant

## 2017-12-07 ENCOUNTER — Encounter: Payer: Self-pay | Admitting: Adult Health

## 2017-12-08 ENCOUNTER — Encounter: Payer: Self-pay | Admitting: Adult Health

## 2017-12-08 ENCOUNTER — Ambulatory Visit: Payer: 59 | Admitting: Adult Health

## 2017-12-08 VITALS — BP 135/82 | HR 81 | Ht 72.0 in | Wt 230.8 lb

## 2017-12-08 DIAGNOSIS — Z9989 Dependence on other enabling machines and devices: Secondary | ICD-10-CM | POA: Diagnosis not present

## 2017-12-08 DIAGNOSIS — G4733 Obstructive sleep apnea (adult) (pediatric): Secondary | ICD-10-CM

## 2017-12-08 NOTE — Progress Notes (Addendum)
  PATIENT: Robert Wise DOB: 07/31/1960  REASON FOR VISIT: follow up HISTORY FROM: patient  HISTORY OF PRESENT ILLNESS: Today 12/08/17 Robert Wise is a 57-year-old male with a history of obstructive sleep apnea on CPAP.  His CPAP download indicates that he uses machine 28 out of 30 days for compliance of 93%.  He uses machine greater than 4 hours each night.  On average he uses his machine 6 hours and 4 minutes.  His residual AHI is 7.8 on 14 cmH2O with EPR of 1.  His leak in the 95th percentile is 60.5 L/min.  At the last visit he was sent to his DME company for mask refitting.  He states that he did this and is currently using a nasal mask.  He does not feel the mask leaking at night.  He returns today for evaluation.  HISTORY 06/02/17 Robert Wise is a 56-year-old male with a history of obstructive sleep apnea on CPAP.  He returns today for an evaluation.  His CPAP download indicates that he use his machine 29 out of 30 days for compliance of 97%.  He uses machine greater than 4 hours 26 out of 30 days for compliance of 87%.  On average he uses it 6 hours and 49 minutes.  His residual AHI is 2.6 on 14 cm of water with EPR 1.  The patient has a leak in the 95th percentile at 47.6 L/min.  The patient states that he has never changed out his supplies.  Reports that he has had to tighten his mask because he can feel it leaking.  Overall he feels that the CPAP has been beneficial.  He returns today for an evaluation.  REVIEW OF SYSTEMS: Out of a complete 14 system review of symptoms, the patient complains only of the following symptoms, and all other reviewed systems are negative.  See HPI  ALLERGIES: Allergies  Allergen Reactions  . Tricor [Fenofibrate]   . Zocor [Simvastatin]     HOME MEDICATIONS: Outpatient Medications Prior to Visit  Medication Sig Dispense Refill  . albuterol (PROVENTIL HFA;VENTOLIN HFA) 108 (90 Base) MCG/ACT inhaler INHALE 1-2 PUFFS INTO THE LUNGS EVERY 4 (FOUR)  HOURS AS NEEDED FOR WHEEZING OR SHORTNESS OF BREATH. 8.5 Inhaler 0  . atorvastatin (LIPITOR) 40 MG tablet Take 1 tablet (40 mg total) by mouth daily. Office visit prior to refill. 90 tablet 3  . azithromycin (ZITHROMAX) 250 MG tablet Take 2 pills by mouth on day 1, then 1 pill by mouth per day on days 2 through 5. 6 tablet 0  . benzonatate (TESSALON) 100 MG capsule Take 1 capsule (100 mg total) by mouth 3 (three) times daily as needed for cough. 20 capsule 0  . Blood Glucose Monitoring Suppl (ONE TOUCH ULTRA SYSTEM KIT) W/DEVICE KIT 1 kit by Does not apply route once. 1 each 0  . busPIRone (BUSPAR) 15 MG tablet 15 mg 2 (two) times daily.    . Dapagliflozin-Metformin HCl ER (XIGDUO XR) 07-998 MG TB24 Take 2 tablets by mouth daily. 180 tablet 1  . DULoxetine (CYMBALTA) 60 MG capsule TAKE 1 CAPSULE (60 MG TOTAL) BY MOUTH DAILY. 90 capsule 3  . esomeprazole (NEXIUM) 40 MG capsule Take 1 capsule (40 mg total) by mouth daily. 90 capsule 3  . ferrous sulfate 325 (65 FE) MG tablet Take 65 mg by mouth daily with breakfast.    . fish oil-omega-3 fatty acids 1000 MG capsule Take 2 g by mouth daily.    .   glucose blood (ONE TOUCH ULTRA TEST) test strip 1 each by Other route daily. 100 each 8  . lamoTRIgine (LAMICTAL) 25 MG tablet TAKE 1 TABLET DAILY FOR 2 WEEKS THEN 2 TABLETS DAILY  1  . Lancets (ONETOUCH ULTRASOFT) lancets USE AS INSTRUCTED 100 each 0  . levothyroxine (SYNTHROID, LEVOTHROID) 150 MCG tablet Take 1 tablet (150 mcg total) by mouth daily before breakfast. 90 tablet 3  . lisinopril (PRINIVIL,ZESTRIL) 5 MG tablet Take 1 tablet (5 mg total) by mouth daily. 90 tablet 3   No facility-administered medications prior to visit.     PAST MEDICAL HISTORY: Past Medical History:  Diagnosis Date  . Anxiety   . Depression   . Diabetes mellitus without complication (HCC)   . Hyperlipidemia   . OSA (obstructive sleep apnea) 12/15/2012    PAST SURGICAL HISTORY: Past Surgical History:  Procedure  Laterality Date  . ANKLE SURGERY Left 2006  . VASECTOMY      FAMILY HISTORY: No family history on file.  SOCIAL HISTORY: Social History   Socioeconomic History  . Marital status: Married    Spouse name: Christy  . Number of children: 3  . Years of education: 11.5  . Highest education level: Not on file  Occupational History    Employer: [employer]    Comment: Driver  Social Needs  . Financial resource strain: Not on file  . Food insecurity:    Worry: Not on file    Inability: Not on file  . Transportation needs:    Medical: Not on file    Non-medical: Not on file  Tobacco Use  . Smoking status: Former Smoker    Packs/day: 1.50    Years: 37.00    Pack years: 55.50    Types: Cigarettes    Last attempt to quit: 05/07/2017    Years since quitting: 0.5  . Smokeless tobacco: Never Used  Substance and Sexual Activity  . Alcohol use: No    Comment: quit: 08/14/2011  . Drug use: No  . Sexual activity: Yes    Birth control/protection: None  Lifestyle  . Physical activity:    Days per week: Not on file    Minutes per session: Not on file  . Stress: Not on file  Relationships  . Social connections:    Talks on phone: Not on file    Gets together: Not on file    Attends religious service: Not on file    Active member of club or organization: Not on file    Attends meetings of clubs or organizations: Not on file    Relationship status: Not on file  . Intimate partner violence:    Fear of current or ex partner: Not on file    Emotionally abused: Not on file    Physically abused: Not on file    Forced sexual activity: Not on file  Other Topics Concern  . Not on file  Social History Narrative   Patient lives at home with family.    Caffeine Use: 1 pot of coffee daily      PHYSICAL EXAM  Vitals:   12/08/17 0857  BP: 135/82  Pulse: 81  Weight: 230 lb 12.8 oz (104.7 kg)  Height: 6' (1.829 m)   Body mass index is 31.3 kg/m.  Generalized: Well developed, in  no acute distress   Neurological examination  Mentation: Alert oriented to time, place, history taking. Follows all commands speech and language fluent Cranial nerve II-XII:  Extraocular movements were   full, visual field were full on confrontational test. Facial sensation and strength were normal. Uvula tongue midline. Head turning and shoulder shrug  were normal and symmetric. Motor: The motor testing reveals 5 over 5 strength of all 4 extremities. Good symmetric motor tone is noted throughout.  Sensory: Sensory testing is intact to soft touch on all 4 extremities. No evidence of extinction is noted.  Coordination: Cerebellar testing reveals good finger-nose-finger and heel-to-shin bilaterally.  Gait and station: Gait is normal. Reflexes: Deep tendon reflexes are symmetric and normal bilaterally.   DIAGNOSTIC DATA (LABS, IMAGING, TESTING) - I reviewed patient records, labs, notes, testing and imaging myself where available.  Lab Results  Component Value Date   WBC 9.8 06/02/2017   HGB 14.7 06/02/2017   HCT 42.6 06/02/2017   MCV 81 06/02/2017   PLT 253 06/02/2017      Component Value Date/Time   NA 141 04/11/2017 1149   K 4.4 04/11/2017 1149   CL 101 04/11/2017 1149   CO2 22 04/11/2017 1149   GLUCOSE 193 (H) 04/11/2017 1149   GLUCOSE 136 (H) 12/29/2015 0909   BUN 13 04/11/2017 1149   CREATININE 0.85 04/11/2017 1149   CREATININE 0.72 12/29/2015 0909   CALCIUM 9.8 04/11/2017 1149   PROT 7.2 04/11/2017 1149   ALBUMIN 4.5 04/11/2017 1149   AST 12 04/11/2017 1149   ALT 16 04/11/2017 1149   ALKPHOS 79 04/11/2017 1149   BILITOT 0.3 04/11/2017 1149   GFRNONAA 97 04/11/2017 1149   GFRNONAA >89 07/28/2015 0847   GFRAA 113 04/11/2017 1149   GFRAA >89 07/28/2015 0847   Lab Results  Component Value Date   CHOL 155 04/11/2017   HDL 29 (L) 04/11/2017   LDLCALC 89 04/11/2017   TRIG 187 (H) 04/11/2017   CHOLHDL 5.3 (H) 04/11/2017   Lab Results  Component Value Date   HGBA1C 8.4  04/11/2017   Lab Results  Component Value Date   VITAMINB12 747 06/02/2017   Lab Results  Component Value Date   TSH 0.901 04/11/2017      ASSESSMENT AND PLAN 57 y.o. year old male  has a past medical history of Anxiety, Depression, Diabetes mellitus without complication (HCC), Hyperlipidemia, and OSA (obstructive sleep apnea) (12/15/2012). here with:  1.  Obstructive sleep apnea on CPAP  The patient CPAP download shows good compliance however his residual AHI is slightly elevated.  The patient also has a high leak.  He will be sent to our sleep lab today for mask refitting.  Hopefully if we decrease his leak his residual AHI will also decreased.  Patient is advised that if his symptoms worsen or he develops new symptoms he should let us know.  He will follow-up in 6 months or sooner if needed.   I spent 15 minutes with the patient. 50% of this time was spent reviewing his CPAP download    , MSN, NP-C 12/08/2017, 8:52 AM [facility] [address], [address] [place], [place] [address] [phone]  I reviewed the above note and documentation by the Nurse Practitioner and agree with the history, physical exam, assessment and plan as outlined above. I was immediately available for face-to-face consultation. Saima Athar, MD, PhD [facility] ([facility])   

## 2018-01-25 ENCOUNTER — Encounter: Payer: Self-pay | Admitting: Emergency Medicine

## 2018-01-25 DIAGNOSIS — F411 Generalized anxiety disorder: Secondary | ICD-10-CM | POA: Insufficient documentation

## 2018-01-25 DIAGNOSIS — R5383 Other fatigue: Secondary | ICD-10-CM | POA: Insufficient documentation

## 2018-02-02 ENCOUNTER — Ambulatory Visit: Payer: Self-pay | Admitting: Psychiatry

## 2018-02-06 ENCOUNTER — Ambulatory Visit: Payer: Self-pay | Admitting: Family Medicine

## 2018-02-06 ENCOUNTER — Encounter: Payer: Self-pay | Admitting: Family Medicine

## 2018-02-06 ENCOUNTER — Other Ambulatory Visit: Payer: Self-pay

## 2018-02-06 VITALS — BP 140/70 | HR 90 | Temp 98.2°F | Ht 72.0 in | Wt 231.2 lb

## 2018-02-06 DIAGNOSIS — Z024 Encounter for examination for driving license: Secondary | ICD-10-CM

## 2018-02-06 LAB — HM DIABETES EYE EXAM

## 2018-02-06 NOTE — Patient Instructions (Addendum)
Keep follow up with primary care in few days for updated diabetes testing and recheck blood pressure.   Please have your psychiatrist send me a letter regarding stability of depression and if you are cleared to drive.   I did review the results from your recent sleep specialist visit, but for future DOT physicals make sure to bring a copy of the compliance report from your CPAP machine.  38-month card provided today, but once I see the clearance letter from psychiatry as well as updated hemoglobin A1c I can change that to a 1 year card from today.  Thank you for coming in today.   If you have lab work done today you will be contacted with your lab results within the next 2 weeks.  If you have not heard from [place] then please contact us. The fastest way to get your results is to register for My Chart.   IF you received an x-ray today, you will receive an invoice from [facility]. Please contact [facility] at [phone] with questions or concerns regarding your invoice.   IF you received labwork today, you will receive an invoice from [place]. Please contact [facility] at [phone] with questions or concerns regarding your invoice.   Our billing staff will not be able to assist you with questions regarding bills from these companies.  You will be contacted with the lab results as soon as they are available. The fastest way to get your results is to activate your My Chart account. Instructions are located on the last page of this paperwork. If you have not heard from [place] regarding the results in 2 weeks, please contact this office.

## 2018-02-06 NOTE — Progress Notes (Signed)
Subjective:  By signing my name below, I, Essence Howell, attest that this documentation has been prepared under the direction and in the presence of [name], MD Electronically Signed: Ladene Artist, ED Scribe 02/06/2018 at 3:35 PM.   Patient ID: Robert Wise, male    DOB: 1960-09-19, 57 y.o.   MRN: [id number]  Chief Complaint  Patient presents with  . Employment Physical    DOT   HPI Robert Wise is a 57 y.o. male who presents to Primary Care at [facility] for DOT physical. H/o DM, depression, chronic pain, OSA on CPAP. - Pt states that his DOT card expires on 11/12. Denies cp, sob, missing limbs, weakness in extremities.  OSA on CPAP Seen by [facility] 9/9. Download indicated good compliance but still had elevated AHI. Mask was refitted. Download indicated use of 29/30 days, 97% compliance, machine use greater than 4 hrs at 87%, average of 6 hours and 49 mins. Residual AHI 2.6. - Pt states that he recently got a new mask. Denies daytime somnolence, drowsiness behind the wheel.  DM On oral meds only. Has not had recent A1C, last tested in Jan at 8.4. Appointment scheduled in 4 days with Dr. Mitchel Honour. - Denies blurred vision, polydipsia, lightheadedness, dizziness, hypoglycemic lows.  Depression Treated by Larina Earthly with [facility]. Started on Lamictal earlier this yr. Also takes Cymbalta and Buspar. - Pt states that he feels that depression is stable at this point. Last OV was 3 months ago. Denies changes in meds or missed doses, hallucinations, SI or attempts, feeling distracted behind the wheel. Depression screen [facility] 2/9 02/06/2018 06/02/2017 04/11/2017 02/10/2017 05/20/2016  Decreased Interest 0 3 0 0 0  Down, Depressed, Hopeless 0 3 0 0 0  PHQ - 2 Score 0 6 0 0 0  Altered sleeping - 3 - - -  Tired, decreased energy - 3 - - -  Change in appetite - 2 - - -  Feeling bad or failure about yourself  - 2 - - -  Trouble concentrating - 2 - - -  Moving slowly  or fidgety/restless - 2 - - -  Suicidal thoughts - 2 - - -  PHQ-9 Score - 22 - - -  Difficult doing work/chores - Somewhat difficult - - -   H/o Tobacco Use Pt quit smoking 05/07/17. Denies coughing spells or coughing fits. Denies illicit drug use.  Patient Active Problem List   Diagnosis Date Noted  . Anxiety state 01/25/2018  . Fatigue 01/25/2018  . Uncontrolled type 2 diabetes mellitus with complication, without long-term current use of insulin ([place]) 02/10/2015  . Chronic pain 12/01/2014  . Depression 10/12/2014   Past Medical History:  Diagnosis Date  . Anxiety   . Depression   . Diabetes mellitus without complication ([place])   . Hyperlipidemia   . OSA (obstructive sleep apnea) 12/15/2012   Past Surgical History:  Procedure Laterality Date  . ANKLE SURGERY Left 2006  . VASECTOMY     Allergies  Allergen Reactions  . Tricor [Fenofibrate]   . Zocor [Simvastatin]    Prior to Admission medications   Medication Sig Start Date End Date Taking? Authorizing Provider  albuterol (PROVENTIL HFA;VENTOLIN HFA) 108 (90 Base) MCG/ACT inhaler INHALE 1-2 PUFFS INTO THE LUNGS EVERY 4 (FOUR) HOURS AS NEEDED FOR WHEEZING OR SHORTNESS OF BREATH. 07/01/17   Jaynee Eagles, PA-C  atorvastatin (LIPITOR) 40 MG tablet Take 1 tablet (40 mg total) by mouth daily. Office visit prior to refill.  04/11/17   Jaynee Eagles, PA-C  benzonatate (TESSALON) 100 MG capsule Take 1 capsule (100 mg total) by mouth 3 (three) times daily as needed for cough. 06/02/17   [name], MD  Blood Glucose Monitoring Suppl (ONE TOUCH ULTRA SYSTEM KIT) W/DEVICE KIT 1 kit by Does not apply route once. 10/25/11   Weber, Damaris Hippo, PA-C  busPIRone (BUSPAR) 15 MG tablet 15 mg 2 (two) times daily. 05/28/17   [provider]  Dapagliflozin-Metformin HCl ER (XIGDUO XR) 07-998 MG TB24 Take 2 tablets by mouth daily. 04/11/17   Jaynee Eagles, PA-C  DULoxetine (CYMBALTA) 60 MG capsule TAKE 1 CAPSULE (60 MG TOTAL) BY MOUTH DAILY. 04/11/17    Jaynee Eagles, PA-C  esomeprazole (NEXIUM) 40 MG capsule Take 1 capsule (40 mg total) by mouth daily. 04/11/17   Jaynee Eagles, PA-C  ferrous sulfate 325 (65 FE) MG tablet Take 65 mg by mouth daily with breakfast.    [provider]  fish oil-omega-3 fatty acids 1000 MG capsule Take 2 g by mouth daily.    [provider]  glucose blood (ONE TOUCH ULTRA TEST) test strip 1 each by Other route daily. 12/01/14   Jaynee Eagles, PA-C  lamoTRIgine (LAMICTAL) 150 MG tablet Take 300 mg by mouth daily. 9/9/169 taking one tab in morning, one tab nightly 12/02/17   [provider]  Lancets Glory Rosebush ULTRASOFT) lancets USE AS INSTRUCTED 11/28/13   [name], MD  levothyroxine (SYNTHROID, LEVOTHROID) 150 MCG tablet Take 1 tablet (150 mcg total) by mouth daily before breakfast. 04/17/17   Jaynee Eagles, PA-C  lisinopril (PRINIVIL,ZESTRIL) 5 MG tablet Take 1 tablet (5 mg total) by mouth daily. 04/11/17   Jaynee Eagles, PA-C   Social History   Socioeconomic History  . Marital status: Married    Spouse name: [name]  . Number of children: 3  . Years of education: 11.5  . Highest education level: Not on file  Occupational History    Employer: [employer]    Comment: Driver  Social Needs  . Financial resource strain: Not on file  . Food insecurity:    Worry: Not on file    Inability: Not on file  . Transportation needs:    Medical: Not on file    Non-medical: Not on file  Tobacco Use  . Smoking status: Former Smoker    Packs/day: 1.50    Years: 37.00    Pack years: 55.50    Types: Cigarettes    Last attempt to quit: 05/07/2017    Years since quitting: 0.7  . Smokeless tobacco: Never Used  Substance and Sexual Activity  . Alcohol use: No    Comment: quit: 08/14/2011  . Drug use: No  . Sexual activity: Yes    Birth control/protection: None  Lifestyle  . Physical activity:    Days per week: Not on file    Minutes per session: Not on file  . Stress: Not on file  Relationships    . Social connections:    Talks on phone: Not on file    Gets together: Not on file    Attends religious service: Not on file    Active member of club or organization: Not on file    Attends meetings of clubs or organizations: Not on file    Relationship status: Not on file  . Intimate partner violence:    Fear of current or ex partner: Not on file    Emotionally abused: Not on file  Physically abused: Not on file    Forced sexual activity: Not on file  Other Topics Concern  . Not on file  Social History Narrative   Patient lives at home with family.    Caffeine Use: 1 pot of coffee daily   Review of Systems  Eyes: Negative for visual disturbance.  Respiratory: Negative for cough and shortness of breath.   Cardiovascular: Negative for chest pain.  Endocrine: Negative for polydipsia.  Neurological: Negative for dizziness, weakness and light-headedness.  Psychiatric/Behavioral: Negative for dysphoric mood, hallucinations, sleep disturbance and suicidal ideas.      Objective:   Physical Exam  Constitutional: He is oriented to person, place, and time. He appears well-developed and well-nourished.  HENT:  Head: Normocephalic and atraumatic.  Right Ear: External ear normal.  Left Ear: External ear normal.  Mouth/Throat: Oropharynx is clear and moist.  Eyes: Pupils are equal, round, and reactive to light. Conjunctivae and EOM are normal.  Eyes are dilated but equal and reactive bilat. (Pt just came from the eye doctor).  Neck: Normal range of motion. Neck supple. No thyromegaly present.  Cardiovascular: Normal rate, regular rhythm, normal heart sounds and intact distal pulses.  Pulmonary/Chest: Effort normal and breath sounds normal. No respiratory distress. He has no wheezes.  Abdominal: Soft. He exhibits no distension. There is no tenderness. Hernia confirmed negative in the right inguinal area and confirmed negative in the left inguinal area.  Musculoskeletal: Normal range of  motion. He exhibits no edema or tenderness.  Lymphadenopathy:    He has no cervical adenopathy.  Neurological: He is alert and oriented to person, place, and time. He has normal reflexes.  Skin: Skin is warm and dry.  Psychiatric: He has a normal mood and affect. His behavior is normal.  Vitals reviewed.  Vitals:   02/06/18 1455 02/06/18 1504  BP: (!) 166/81 140/70  Pulse: 90   Temp: 98.2 F (36.8 C)   TempSrc: Oral   SpO2: 95%   Weight: 231 lb 3.2 oz (104.9 kg)   Height: 6' (1.829 m)    Pt wears glasses to drive.  Visual Acuity Screening   Right eye Left eye Both eyes  Without correction:     With correction: 20/25 20/25 20/25   Comments: Peripheral Vision: Right eye 85 degrees. Left eye 85 degrees.  The patient can distinguish the colors red, amber and green.   Hearing Screening Comments: The patient was able to hear a forced whisper from 8 feet.     Assessment & Plan:  Robert Wise is a 57 y.o. male Encounter for commercial driver medical examination (CDME)  -Borderline, but passes blood pressure.  -History of OSA on CPAP, notes reviewed with compliance with CPAP recently.  Encouraged to bring copy of compliance report to next DOT physical  -Depression appears to be controlled with current regimen, will ask for clearance letter from his psychiatrist  -No recent diabetes testing.  Has upcoming follow-up with primary care provider.  -3 months card provided until able to review clearance from psychiatry and updated A1c for diabetes.  Anticipate 1 year card at that time  No orders of the defined types were placed in this encounter.  Patient Instructions   Keep follow up with primary care in few days for updated diabetes testing and recheck blood pressure.   Please have your psychiatrist send me a letter regarding stability of depression and if you are cleared to drive.   I did review the results from your recent sleep  specialist visit, but for future DOT physicals  make sure to bring a copy of the compliance report from your CPAP machine.  56-monthcard provided today, but once I see the clearance letter from psychiatry as well as updated hemoglobin A1c I can change that to a 1 year card from today.  Thank you for coming in today.   If you have lab work done today you will be contacted with your lab results within the next 2 weeks.  If you have not heard from [place] please contact [place] The fastest way to get your results is to register for My Chart.   IF you received an x-ray today, you will receive an invoice from [facility]. Please contact [facility] at [phone] questions or concerns regarding your invoice.   IF you received labwork today, you will receive an invoice from [place] Please contact [facility] at [phone] questions or concerns regarding your invoice.   Our billing staff will not be able to assist you with questions regarding bills from these companies.  You will be contacted with the lab results as soon as they are available. The fastest way to get your results is to activate your My Chart account. Instructions are located on the last page of this paperwork. If you have not heard from [place] results in 2 weeks, please contact this office.       I personally performed the services described in this documentation, which was scribed in my presence. The recorded information has been reviewed and considered for accuracy and completeness, addended by me as needed, and agree with information above.  Signed,   JMerri Ray MD [facility] at [facility]  02/08/18 10:14 AM

## 2018-02-10 ENCOUNTER — Encounter: Payer: Self-pay | Admitting: Emergency Medicine

## 2018-02-10 ENCOUNTER — Ambulatory Visit: Payer: 59 | Admitting: Emergency Medicine

## 2018-02-10 ENCOUNTER — Other Ambulatory Visit: Payer: Self-pay

## 2018-02-10 ENCOUNTER — Other Ambulatory Visit: Payer: Self-pay | Admitting: Urgent Care

## 2018-02-10 VITALS — BP 119/66 | HR 74 | Temp 98.6°F | Resp 16 | Ht 70.5 in | Wt 226.0 lb

## 2018-02-10 DIAGNOSIS — IMO0002 Reserved for concepts with insufficient information to code with codable children: Secondary | ICD-10-CM

## 2018-02-10 DIAGNOSIS — E039 Hypothyroidism, unspecified: Secondary | ICD-10-CM

## 2018-02-10 DIAGNOSIS — E1165 Type 2 diabetes mellitus with hyperglycemia: Secondary | ICD-10-CM

## 2018-02-10 DIAGNOSIS — Z23 Encounter for immunization: Secondary | ICD-10-CM

## 2018-02-10 DIAGNOSIS — E782 Mixed hyperlipidemia: Secondary | ICD-10-CM | POA: Diagnosis not present

## 2018-02-10 DIAGNOSIS — I10 Essential (primary) hypertension: Secondary | ICD-10-CM

## 2018-02-10 DIAGNOSIS — E118 Type 2 diabetes mellitus with unspecified complications: Secondary | ICD-10-CM

## 2018-02-10 DIAGNOSIS — E119 Type 2 diabetes mellitus without complications: Secondary | ICD-10-CM

## 2018-02-10 LAB — POCT GLYCOSYLATED HEMOGLOBIN (HGB A1C): Hemoglobin A1C: 14 % — AB (ref 4.0–5.6)

## 2018-02-10 LAB — GLUCOSE, POCT (MANUAL RESULT ENTRY): POC Glucose: 199 mg/dl — AB (ref 70–99)

## 2018-02-10 MED ORDER — ATORVASTATIN CALCIUM 40 MG PO TABS: 40.0000 mg | ORAL_TABLET | Freq: Every day | ORAL | 3 refills | Status: DC

## 2018-02-10 MED ORDER — LISINOPRIL 5 MG PO TABS
5.0000 mg | ORAL_TABLET | Freq: Every day | ORAL | 3 refills | Status: DC
Start: 2018-02-10 — End: 2019-02-18

## 2018-02-10 MED ORDER — LEVOTHYROXINE SODIUM 150 MCG PO TABS: 150.0000 ug | ORAL_TABLET | Freq: Every day | ORAL | 3 refills | Status: DC

## 2018-02-10 MED ORDER — GLIPIZIDE 5 MG PO TABS: 5.0000 mg | ORAL_TABLET | Freq: Every day | ORAL | 3 refills | Status: DC

## 2018-02-10 MED ORDER — DAPAGLIFLOZIN PRO-METFORMIN ER 5-1000 MG PO TB24: 2.0000 | ORAL_TABLET | Freq: Every day | ORAL | 1 refills | Status: DC

## 2018-02-10 NOTE — Progress Notes (Signed)
Lab Results  Component Value Date   HGBA1C 8.4 04/11/2017   BP Readings from Last 3 Encounters:  02/10/18 119/66  02/06/18 140/70  12/08/17 135/82   Wt Readings from Last 3 Encounters:  02/10/18 226 lb (102.5 kg)  02/06/18 231 lb 3.2 oz (104.9 kg)  12/08/17 230 lb 12.8 oz (104.7 kg)   Robert Wise 57 y.o.   Chief Complaint  Patient presents with  . Medication Refill    Atorvastatin,Dapagliflozin-Metformin HCL, Levothyroxine, Lisinopril    HISTORY OF PRESENT ILLNESS: This is a 57 y.o. male first visit with me, with history of diabetes, hypertension, hypothyroidism, and high cholesterol, here for follow-up medication refills. Needs blood work for hemoglobin A1c and lipid profile.  Fasting. Blood glucose at home has been 170-180 in average. Has no complaints or medical concerns at this point.  HPI   Prior to Admission medications   Medication Sig Start Date End Date Taking? Authorizing Provider  atorvastatin (LIPITOR) 40 MG tablet Take 1 tablet (40 mg total) by mouth daily. Office visit prior to refill. 04/11/17  Yes Jaynee Eagles, PA-C  busPIRone (BUSPAR) 15 MG tablet 15 mg 2 (two) times daily. 05/28/17  Yes [provider]  Dapagliflozin-Metformin HCl ER (XIGDUO XR) 07-998 MG TB24 Take 2 tablets by mouth daily. 04/11/17  Yes Jaynee Eagles, PA-C  DULoxetine (CYMBALTA) 60 MG capsule TAKE 1 CAPSULE (60 MG TOTAL) BY MOUTH DAILY. 04/11/17  Yes Jaynee Eagles, PA-C  esomeprazole (NEXIUM) 40 MG capsule Take 1 capsule (40 mg total) by mouth daily. 04/11/17  Yes Jaynee Eagles, PA-C  ferrous sulfate 325 (65 FE) MG tablet Take 65 mg by mouth daily with breakfast.   Yes [provider]  fish oil-omega-3 fatty acids 1000 MG capsule Take 2 g by mouth daily.   Yes [provider]  lamoTRIgine (LAMICTAL) 150 MG tablet Take 300 mg by mouth daily. 9/9/169 taking one tab in morning, one tab nightly 12/02/17  Yes [provider]  Lancets (ONETOUCH ULTRASOFT) lancets USE  AS INSTRUCTED 11/28/13  Yes [name], MD  levothyroxine (SYNTHROID, LEVOTHROID) 150 MCG tablet Take 1 tablet (150 mcg total) by mouth daily before breakfast. 04/17/17  Yes Jaynee Eagles, PA-C  lisinopril (PRINIVIL,ZESTRIL) 5 MG tablet Take 1 tablet (5 mg total) by mouth daily. 04/11/17  Yes Jaynee Eagles, PA-C  albuterol (PROVENTIL HFA;VENTOLIN HFA) 108 (90 Base) MCG/ACT inhaler INHALE 1-2 PUFFS INTO THE LUNGS EVERY 4 (FOUR) HOURS AS NEEDED FOR WHEEZING OR SHORTNESS OF BREATH. Patient not taking: Reported on 02/10/2018 07/01/17   Jaynee Eagles, PA-C  Blood Glucose Monitoring Suppl (ONE TOUCH ULTRA SYSTEM KIT) W/DEVICE KIT 1 kit by Does not apply route once. 10/25/11   Weber, Sarah L, PA-C  glucose blood (ONE TOUCH ULTRA TEST) test strip 1 each by Other route daily. 12/01/14   Jaynee Eagles, PA-C    Allergies  Allergen Reactions  . Tricor [Fenofibrate]   . Zocor [Simvastatin]     Patient Active Problem List   Diagnosis Date Noted  . Anxiety state 01/25/2018  . Fatigue 01/25/2018  . Uncontrolled type 2 diabetes mellitus with complication, without long-term current use of insulin ([place]) 02/10/2015  . Chronic pain 12/01/2014  . Depression 10/12/2014    Past Medical History:  Diagnosis Date  . Anxiety   . Depression   . Diabetes mellitus without complication ([place])   . Hyperlipidemia   . OSA (obstructive sleep apnea) 12/15/2012    Past Surgical History:  Procedure Laterality Date  . ANKLE  SURGERY Left 2006  . VASECTOMY      Social History   Socioeconomic History  . Marital status: Married    Spouse name: [name]  . Number of children: 3  . Years of education: 11.5  . Highest education level: Not on file  Occupational History    Employer: [employer]    Comment: Driver  Social Needs  . Financial resource strain: Not on file  . Food insecurity:    Worry: Not on file    Inability: Not on file  . Transportation needs:    Medical: Not on file    Non-medical: Not on file  Tobacco  Use  . Smoking status: Former Smoker    Packs/day: 1.50    Years: 37.00    Pack years: 55.50    Types: Cigarettes    Last attempt to quit: 05/07/2017    Years since quitting: 0.7  . Smokeless tobacco: Never Used  Substance and Sexual Activity  . Alcohol use: No    Comment: quit: 08/14/2011  . Drug use: No  . Sexual activity: Yes    Birth control/protection: None  Lifestyle  . Physical activity:    Days per week: Not on file    Minutes per session: Not on file  . Stress: Not on file  Relationships  . Social connections:    Talks on phone: Not on file    Gets together: Not on file    Attends religious service: Not on file    Active member of club or organization: Not on file    Attends meetings of clubs or organizations: Not on file    Relationship status: Not on file  . Intimate partner violence:    Fear of current or ex partner: Not on file    Emotionally abused: Not on file    Physically abused: Not on file    Forced sexual activity: Not on file  Other Topics Concern  . Not on file  Social History Narrative   Patient lives at home with family.    Caffeine Use: 1 pot of coffee daily    No family history on file.   Review of Systems  Constitutional: Negative.  Negative for chills and fever.  HENT: Negative.  Negative for congestion and sore throat.   Eyes: Negative.  Negative for blurred vision and double vision.  Respiratory: Negative.  Negative for cough and shortness of breath.   Cardiovascular: Negative.  Negative for chest pain and palpitations.  Gastrointestinal: Negative.  Negative for abdominal pain, diarrhea, nausea and vomiting.  Genitourinary: Negative.  Negative for dysuria and hematuria.  Musculoskeletal: Negative for back pain, myalgias and neck pain.  Skin: Negative.  Negative for rash.  Neurological: Negative.  Negative for dizziness and headaches.  Endo/Heme/Allergies: Negative.   All other systems reviewed and are negative.   Vitals:   02/10/18  1038  BP: 119/66  Pulse: 74  Resp: 16  Temp: 98.6 F (37 C)  SpO2: 94%    Physical Exam  Constitutional: He is oriented to person, place, and time. He appears well-developed and well-nourished.  HENT:  Head: Normocephalic and atraumatic.  Nose: Nose normal.  Mouth/Throat: Oropharynx is clear and moist.  Eyes: Pupils are equal, round, and reactive to light. Conjunctivae and EOM are normal.  Neck: Normal range of motion. Neck supple.  Cardiovascular: Normal rate, regular rhythm and normal heart sounds.  Pulmonary/Chest: Effort normal and breath sounds normal.  Abdominal: Soft. He exhibits no distension. There is no  tenderness.  Musculoskeletal: Normal range of motion. He exhibits no edema.  Neurological: He is alert and oriented to person, place, and time. He exhibits normal muscle tone.  Skin: Skin is warm and dry.  Psychiatric: He has a normal mood and affect. His behavior is normal.  Vitals reviewed.    Results for orders placed or performed in visit on 02/10/18 (from the past 24 hour(s))  POCT glucose (manual entry)     Status: Abnormal   Collection Time: 02/10/18 11:26 AM  Result Value Ref Range   POC Glucose 199 (A) 70 - 99 mg/dl  POCT glycosylated hemoglobin (Hb A1C)     Status: Abnormal   Collection Time: 02/10/18 11:32 AM  Result Value Ref Range   Hemoglobin A1C 14.0 (A) 4.0 - 5.6 %   HbA1c POC (<> result, manual entry)     HbA1c, POC (prediabetic range)     HbA1c, POC (controlled diabetic range)      A total of 40 minutes was spent in the room with the patient, greater than 50% of which was in counseling/coordination of care regarding diabetes, treatment, medication changes, nutrition, physical activity, and need for follow-up.  ASSESSMENT & PLAN: Uncontrolled type 2 diabetes mellitus with complication, without long-term current use of insulin ([place]) Uncontrolled diabetes with hemoglobin A1c of 14.  Question compliance with medications.  Patient has been on  glipizide before.  Will start glipizide 5 mg daily in the morning.  Advised about hypoglycemia symptoms and how to treat them.  Advised about nutrition and exercise.  Follow-up in 3 months.  Odin was seen today for medication refill.  Diagnoses and all orders for this visit:  Type 2 diabetes mellitus with hyperglycemia, without long-term current use of insulin ([facility]) -     Dapagliflozin-metFORMIN HCl ER (XIGDUO XR) 07-998 MG TB24; Take 2 tablets by mouth daily. -     POCT glucose (manual entry) -     POCT glycosylated hemoglobin (Hb A1C) -     Comprehensive metabolic panel -     glipiZIDE (GLUCOTROL) 5 MG tablet; Take 1 tablet (5 mg total) by mouth daily before breakfast.  Need for prophylactic vaccination and inoculation against influenza -     Flu Vaccine QUAD 36+ mos IM  Mixed hyperlipidemia -     atorvastatin (LIPITOR) 40 MG tablet; Take 1 tablet (40 mg total) by mouth daily. Office visit prior to refill. -     Lipid panel  Hypothyroidism, unspecified type -     levothyroxine (SYNTHROID, LEVOTHROID) 150 MCG tablet; Take 1 tablet (150 mcg total) by mouth daily before breakfast.  Essential hypertension  Uncontrolled type 2 diabetes mellitus with complication, without long-term current use of insulin ([place])  Other orders -     lisinopril (PRINIVIL,ZESTRIL) 5 MG tablet; Take 1 tablet (5 mg total) by mouth daily.    Patient Instructions       If you have lab work done today you will be contacted with your lab results within the next 2 weeks.  If you have not heard from [place] then please contact us. The fastest way to get your results is to register for My Chart.   IF you received an x-ray today, you will receive an invoice from [facility]. Please contact [facility] at [phone] with questions or concerns regarding your invoice.   IF you received labwork today, you will receive an invoice from [place]. Please contact [facility] at [phone] with questions  or concerns regarding your invoice.  Our billing staff will not be able to assist you with questions regarding bills from these companies.  You will be contacted with the lab results as soon as they are available. The fastest way to get your results is to activate your My Chart account. Instructions are located on the last page of this paperwork. If you have not heard from [place] regarding the results in 2 weeks, please contact this office.     Diabetes Mellitus and Nutrition When you have diabetes (diabetes mellitus), it is very important to have healthy eating habits because your blood sugar (glucose) levels are greatly affected by what you eat and drink. Eating healthy foods in the appropriate amounts, at about the same times every day, can help you:  Control your blood glucose.  Lower your risk of heart disease.  Improve your blood pressure.  Reach or maintain a healthy weight.  Every person with diabetes is different, and each person has different needs for a meal plan. Your health care provider may recommend that you work with a diet and nutrition specialist (dietitian) to make a meal plan that is best for you. Your meal plan may vary depending on factors such as:  The calories you need.  The medicines you take.  Your weight.  Your blood glucose, blood pressure, and cholesterol levels.  Your activity level.  Other health conditions you have, such as heart or kidney disease.  How do carbohydrates affect me? Carbohydrates affect your blood glucose level more than any other type of food. Eating carbohydrates naturally increases the amount of glucose in your blood. Carbohydrate counting is a method for keeping track of how many carbohydrates you eat. Counting carbohydrates is important to keep your blood glucose at a healthy level, especially if you use insulin or take certain oral diabetes medicines. It is important to know how many carbohydrates you can safely have in each meal.  This is different for every person. Your dietitian can help you calculate how many carbohydrates you should have at each meal and for snack. Foods that contain carbohydrates include:  Bread, cereal, rice, pasta, and crackers.  Potatoes and corn.  Peas, beans, and lentils.  Milk and yogurt.  Fruit and juice.  Desserts, such as cakes, cookies, ice cream, and candy.  How does alcohol affect me? Alcohol can cause a sudden decrease in blood glucose (hypoglycemia), especially if you use insulin or take certain oral diabetes medicines. Hypoglycemia can be a life-threatening condition. Symptoms of hypoglycemia (sleepiness, dizziness, and confusion) are similar to symptoms of having too much alcohol. If your health care provider says that alcohol is safe for you, follow these guidelines:  Limit alcohol intake to no more than 1 drink per day for nonpregnant women and 2 drinks per day for men. One drink equals 12 oz of beer, 5 oz of wine, or 1 oz of hard liquor.  Do not drink on an empty stomach.  Keep yourself hydrated with water, diet soda, or unsweetened iced tea.  Keep in mind that regular soda, juice, and other mixers may contain a lot of sugar and must be counted as carbohydrates.  What are tips for following this plan? Reading food labels  Start by checking the serving size on the label. The amount of calories, carbohydrates, fats, and other nutrients listed on the label are based on one serving of the food. Many foods contain more than one serving per package.  Check the total grams (g) of carbohydrates in one serving. You can  calculate the number of servings of carbohydrates in one serving by dividing the total carbohydrates by 15. For example, if a food has 30 g of total carbohydrates, it would be equal to 2 servings of carbohydrates.  Check the number of grams (g) of saturated and trans fats in one serving. Choose foods that have low or no amount of these fats.  Check the number  of milligrams (mg) of sodium in one serving. Most people should limit total sodium intake to less than 2,300 mg per day.  Always check the nutrition information of foods labeled as "low-fat" or "nonfat". These foods may be higher in added sugar or refined carbohydrates and should be avoided.  Talk to your dietitian to identify your daily goals for nutrients listed on the label. Shopping  Avoid buying canned, premade, or processed foods. These foods tend to be high in fat, sodium, and added sugar.  Shop around the outside edge of the grocery store. This includes fresh fruits and vegetables, bulk grains, fresh meats, and fresh dairy. Cooking  Use low-heat cooking methods, such as baking, instead of high-heat cooking methods like deep frying.  Cook using healthy oils, such as olive, canola, or sunflower oil.  Avoid cooking with butter, cream, or high-fat meats. Meal planning  Eat meals and snacks regularly, preferably at the same times every day. Avoid going long periods of time without eating.  Eat foods high in fiber, such as fresh fruits, vegetables, beans, and whole grains. Talk to your dietitian about how many servings of carbohydrates you can eat at each meal.  Eat 4-6 ounces of lean protein each day, such as lean meat, chicken, fish, eggs, or tofu. 1 ounce is equal to 1 ounce of meat, chicken, or fish, 1 egg, or 1/4 cup of tofu.  Eat some foods each day that contain healthy fats, such as avocado, nuts, seeds, and fish. Lifestyle   Check your blood glucose regularly.  Exercise at least 30 minutes 5 or more days each week, or as told by your health care provider.  Take medicines as told by your health care provider.  Do not use any products that contain nicotine or tobacco, such as cigarettes and e-cigarettes. If you need help quitting, ask your health care provider.  Work with a Social worker or diabetes educator to identify strategies to manage stress and any emotional and social  challenges. What are some questions to ask my health care provider?  Do I need to meet with a diabetes educator?  Do I need to meet with a dietitian?  What number can I call if I have questions?  When are the best times to check my blood glucose? Where to find more information:  American Diabetes Association: diabetes.org/food-and-fitness/food  Academy of Nutrition and Dietetics: PokerClues.dk  [employer] of Diabetes and Digestive and Kidney Diseases (NIH): ContactWire.be Summary  A healthy meal plan will help you control your blood glucose and maintain a healthy lifestyle.  Working with a diet and nutrition specialist (dietitian) can help you make a meal plan that is best for you.  Keep in mind that carbohydrates and alcohol have immediate effects on your blood glucose levels. It is important to count carbohydrates and to use alcohol carefully. This information is not intended to replace advice given to you by your health care provider. Make sure you discuss any questions you have with your health care provider. Document Released: 12/13/2004 Document Revised: 04/22/2016 Document Reviewed: 04/22/2016 Elsevier Interactive Patient Education  [employer].  Agustina Caroli, MD [facility]

## 2018-02-10 NOTE — Patient Instructions (Addendum)
   If you have lab work done today you will be contacted with your lab results within the next 2 weeks.  If you have not heard from us then please contact us. The fastest way to get your results is to register for My Chart.   IF you received an x-ray today, you will receive an invoice from [facility]. Please contact [facility] at [phone] with questions or concerns regarding your invoice.   IF you received labwork today, you will receive an invoice from [facility]. Please contact [facility] at [phone] with questions or concerns regarding your invoice.   Our billing staff will not be able to assist you with questions regarding bills from these companies.  You will be contacted with the lab results as soon as they are available. The fastest way to get your results is to activate your My Chart account. Instructions are located on the last page of this paperwork. If you have not heard from us regarding the results in 2 weeks, please contact this office.     Diabetes Mellitus and Nutrition When you have diabetes (diabetes mellitus), it is very important to have healthy eating habits because your blood sugar (glucose) levels are greatly affected by what you eat and drink. Eating healthy foods in the appropriate amounts, at about the same times every day, can help you:  Control your blood glucose.  Lower your risk of heart disease.  Improve your blood pressure.  Reach or maintain a healthy weight.  Every person with diabetes is different, and each person has different needs for a meal plan. Your health care provider may recommend that you work with a diet and nutrition specialist (dietitian) to make a meal plan that is best for you. Your meal plan may vary depending on factors such as:  The calories you need.  The medicines you take.  Your weight.  Your blood glucose, blood pressure, and cholesterol levels.  Your activity level.  Other health conditions you  have, such as heart or kidney disease.  How do carbohydrates affect me? Carbohydrates affect your blood glucose level more than any other type of food. Eating carbohydrates naturally increases the amount of glucose in your blood. Carbohydrate counting is a method for keeping track of how many carbohydrates you eat. Counting carbohydrates is important to keep your blood glucose at a healthy level, especially if you use insulin or take certain oral diabetes medicines. It is important to know how many carbohydrates you can safely have in each meal. This is different for every person. Your dietitian can help you calculate how many carbohydrates you should have at each meal and for snack. Foods that contain carbohydrates include:  Bread, cereal, rice, pasta, and crackers.  Potatoes and corn.  Peas, beans, and lentils.  Milk and yogurt.  Fruit and juice.  Desserts, such as cakes, cookies, ice cream, and candy.  How does alcohol affect me? Alcohol can cause a sudden decrease in blood glucose (hypoglycemia), especially if you use insulin or take certain oral diabetes medicines. Hypoglycemia can be a life-threatening condition. Symptoms of hypoglycemia (sleepiness, dizziness, and confusion) are similar to symptoms of having too much alcohol. If your health care provider says that alcohol is safe for you, follow these guidelines:  Limit alcohol intake to no more than 1 drink per day for nonpregnant women and 2 drinks per day for men. One drink equals 12 oz of beer, 5 oz of wine, or 1 oz of hard liquor.  Do   not drink on an empty stomach.  Keep yourself hydrated with water, diet soda, or unsweetened iced tea.  Keep in mind that regular soda, juice, and other mixers may contain a lot of sugar and must be counted as carbohydrates.  What are tips for following this plan? Reading food labels  Start by checking the serving size on the label. The amount of calories, carbohydrates, fats, and other  nutrients listed on the label are based on one serving of the food. Many foods contain more than one serving per package.  Check the total grams (g) of carbohydrates in one serving. You can calculate the number of servings of carbohydrates in one serving by dividing the total carbohydrates by 15. For example, if a food has 30 g of total carbohydrates, it would be equal to 2 servings of carbohydrates.  Check the number of grams (g) of saturated and trans fats in one serving. Choose foods that have low or no amount of these fats.  Check the number of milligrams (mg) of sodium in one serving. Most people should limit total sodium intake to less than 2,300 mg per day.  Always check the nutrition information of foods labeled as "low-fat" or "nonfat". These foods may be higher in added sugar or refined carbohydrates and should be avoided.  Talk to your dietitian to identify your daily goals for nutrients listed on the label. Shopping  Avoid buying canned, premade, or processed foods. These foods tend to be high in fat, sodium, and added sugar.  Shop around the outside edge of the grocery store. This includes fresh fruits and vegetables, bulk grains, fresh meats, and fresh dairy. Cooking  Use low-heat cooking methods, such as baking, instead of high-heat cooking methods like deep frying.  Cook using healthy oils, such as olive, canola, or sunflower oil.  Avoid cooking with butter, cream, or high-fat meats. Meal planning  Eat meals and snacks regularly, preferably at the same times every day. Avoid going long periods of time without eating.  Eat foods high in fiber, such as fresh fruits, vegetables, beans, and whole grains. Talk to your dietitian about how many servings of carbohydrates you can eat at each meal.  Eat 4-6 ounces of lean protein each day, such as lean meat, chicken, fish, eggs, or tofu. 1 ounce is equal to 1 ounce of meat, chicken, or fish, 1 egg, or 1/4 cup of tofu.  Eat some  foods each day that contain healthy fats, such as avocado, nuts, seeds, and fish. Lifestyle   Check your blood glucose regularly.  Exercise at least 30 minutes 5 or more days each week, or as told by your health care provider.  Take medicines as told by your health care provider.  Do not use any products that contain nicotine or tobacco, such as cigarettes and e-cigarettes. If you need help quitting, ask your health care provider.  Work with a counselor or diabetes educator to identify strategies to manage stress and any emotional and social challenges. What are some questions to ask my health care provider?  Do I need to meet with a diabetes educator?  Do I need to meet with a dietitian?  What number can I call if I have questions?  When are the best times to check my blood glucose? Where to find more information:  American Diabetes Association: diabetes.org/food-and-fitness/food  Academy of Nutrition and Dietetics: www.eatright.org/resources/health/diseases-and-conditions/diabetes  National Institute of Diabetes and Digestive and Kidney Diseases (NIH): www.niddk.nih.gov/health-information/diabetes/overview/diet-eating-physical-activity Summary  A healthy meal plan will   help you control your blood glucose and maintain a healthy lifestyle.  Working with a diet and nutrition specialist (dietitian) can help you make a meal plan that is best for you.  Keep in mind that carbohydrates and alcohol have immediate effects on your blood glucose levels. It is important to count carbohydrates and to use alcohol carefully. This information is not intended to replace advice given to you by your health care provider. Make sure you discuss any questions you have with your health care provider. Document Released: 12/13/2004 Document Revised: 04/22/2016 Document Reviewed: 04/22/2016 Elsevier Interactive Patient Education  2018 Elsevier Inc.  

## 2018-02-10 NOTE — Assessment & Plan Note (Signed)
Uncontrolled diabetes with hemoglobin A1c of 14.  Question compliance with medications.  Patient has been on glipizide before.  Will start glipizide 5 mg daily in the morning.  Advised about hypoglycemia symptoms and how to treat them.  Advised about nutrition and exercise.  Follow-up in 3 months.

## 2018-02-11 ENCOUNTER — Encounter: Payer: Self-pay | Admitting: Radiology

## 2018-02-11 LAB — COMPREHENSIVE METABOLIC PANEL
ALT: 23 IU/L (ref 0–44)
AST: 14 IU/L (ref 0–40)
Albumin/Globulin Ratio: 2.1 (ref 1.2–2.2)
Albumin: 4.8 g/dL (ref 3.5–5.5)
Alkaline Phosphatase: 81 IU/L (ref 39–117)
BUN/Creatinine Ratio: 28 — ABNORMAL HIGH (ref 9–20)
BUN: 25 mg/dL — ABNORMAL HIGH (ref 6–24)
Bilirubin Total: 0.3 mg/dL (ref 0.0–1.2)
CO2: 21 mmol/L (ref 20–29)
Calcium: 10 mg/dL (ref 8.7–10.2)
Chloride: 101 mmol/L (ref 96–106)
Creatinine, Ser: 0.89 mg/dL (ref 0.76–1.27)
GFR calc Af Amer: 110 mL/min/{1.73_m2} (ref >59)
GFR calc non Af Amer: 95 mL/min/{1.73_m2} (ref >59)
Globulin, Total: 2.3 g/dL (ref 1.5–4.5)
Glucose: 210 mg/dL — ABNORMAL HIGH (ref 65–99)
Potassium: 4.9 mmol/L (ref 3.5–5.2)
Sodium: 138 mmol/L (ref 134–144)
Total Protein: 7.1 g/dL (ref 6.0–8.5)

## 2018-02-11 LAB — LIPID PANEL
Chol/HDL Ratio: 7 ratio — ABNORMAL HIGH (ref 0.0–5.0)
Cholesterol, Total: 197 mg/dL (ref 100–199)
HDL: 28 mg/dL — ABNORMAL LOW (ref >39)
Triglycerides: 449 mg/dL — ABNORMAL HIGH (ref 0–149)

## 2018-02-27 ENCOUNTER — Telehealth: Payer: Self-pay | Admitting: Family Medicine

## 2018-02-27 NOTE — Telephone Encounter (Signed)
Called and spoke with pt regarding their appt on 04/27/18 with Dr. Neva SeatGreene. Due to provider schedule change, I was able to get pt rescheduled for 04/17/18 at  8:00 AM. I advised of time, building and late policy. Pt acknowledged.

## 2018-03-02 ENCOUNTER — Ambulatory Visit: Payer: Self-pay | Admitting: Psychiatry

## 2018-03-13 ENCOUNTER — Ambulatory Visit: Payer: 59 | Admitting: Psychiatry

## 2018-03-13 DIAGNOSIS — F411 Generalized anxiety disorder: Secondary | ICD-10-CM

## 2018-03-13 DIAGNOSIS — F419 Anxiety disorder, unspecified: Secondary | ICD-10-CM

## 2018-03-13 DIAGNOSIS — F1011 Alcohol abuse, in remission: Secondary | ICD-10-CM

## 2018-03-13 DIAGNOSIS — F329 Major depressive disorder, single episode, unspecified: Secondary | ICD-10-CM | POA: Diagnosis not present

## 2018-03-13 DIAGNOSIS — F32A Depression, unspecified: Secondary | ICD-10-CM

## 2018-03-13 MED ORDER — LAMOTRIGINE 150 MG PO TABS: 300.0000 mg | ORAL_TABLET | Freq: Two times a day (BID) | ORAL | 3 refills | Status: DC

## 2018-03-13 MED ORDER — DULOXETINE HCL 60 MG PO CPEP: ORAL_CAPSULE | ORAL | 3 refills | Status: DC

## 2018-03-13 MED ORDER — BUSPIRONE HCL 15 MG PO TABS: 15.0000 mg | ORAL_TABLET | Freq: Two times a day (BID) | ORAL | 3 refills | Status: DC

## 2018-03-13 NOTE — Progress Notes (Signed)
[facility]  Patient ID: Robert Wise,  MRN: [id number]  PCP: Shawnee Knapp, MD  Date of Evaluation: 03/13/2018 Time spent:20 minutes  Chief Complaint:   HISTORY/CURRENT STATUS: HPI patient was last seen in August of this year.  He had no anxiety and no depression.  He did have grieving as his mother passed away several months ago.  I had an increase his Lamictal at last visit. Patient continues to do well denies depression or anxiety.  Individual Medical History/ Review of Systems: Changes? :No   Allergies: Tricor [fenofibrate] and Zocor [simvastatin]  Current Medications:  Current Outpatient Medications:  .  busPIRone (BUSPAR) 15 MG tablet, Take 1 tablet (15 mg total) by mouth 2 (two) times daily., Disp: 60 tablet, Rfl: 3 .  DULoxetine (CYMBALTA) 60 MG capsule, 2 per day, Disp: 120 capsule, Rfl: 3 .  lamoTRIgine (LAMICTAL) 150 MG tablet, Take 2 tablets (300 mg total) by mouth 2 (two) times daily. 9/9/169 taking one tab in morning, one tab nightly, Disp: 60 tablet, Rfl: 3 .  albuterol (PROVENTIL HFA;VENTOLIN HFA) 108 (90 Base) MCG/ACT inhaler, INHALE 1-2 PUFFS INTO THE LUNGS EVERY 4 (FOUR) HOURS AS NEEDED FOR WHEEZING OR SHORTNESS OF BREATH. (Patient not taking: Reported on 02/10/2018), Disp: 8.5 Inhaler, Rfl: 0 .  atorvastatin (LIPITOR) 40 MG tablet, Take 1 tablet (40 mg total) by mouth daily. Office visit prior to refill., Disp: 90 tablet, Rfl: 3 .  Blood Glucose Monitoring Suppl (ONE TOUCH ULTRA SYSTEM KIT) W/DEVICE KIT, 1 kit by Does not apply route once., Disp: 1 each, Rfl: 0 .  Dapagliflozin-metFORMIN HCl ER (XIGDUO XR) 07-998 MG TB24, Take 2 tablets by mouth daily., Disp: 180 tablet, Rfl: 1 .  esomeprazole (NEXIUM) 40 MG capsule, Take 1 capsule (40 mg total) by mouth daily., Disp: 90 capsule, Rfl: 3 .  ferrous sulfate 325 (65 FE) MG tablet, Take 65 mg by mouth daily with breakfast., Disp: , Rfl:  .  fish oil-omega-3 fatty acids 1000 MG capsule, Take 2 g by mouth  daily., Disp: , Rfl:  .  glipiZIDE (GLUCOTROL) 5 MG tablet, Take 1 tablet (5 mg total) by mouth daily before breakfast., Disp: 60 tablet, Rfl: 3 .  glucose blood (ONE TOUCH ULTRA TEST) test strip, 1 each by Other route daily., Disp: 100 each, Rfl: 8 .  Lancets (ONETOUCH ULTRASOFT) lancets, USE AS INSTRUCTED, Disp: 100 each, Rfl: 0 .  levothyroxine (SYNTHROID, LEVOTHROID) 150 MCG tablet, Take 1 tablet (150 mcg total) by mouth daily before breakfast., Disp: 90 tablet, Rfl: 3 .  lisinopril (PRINIVIL,ZESTRIL) 5 MG tablet, Take 1 tablet (5 mg total) by mouth daily., Disp: 90 tablet, Rfl: 3 Medication Side Effects: none  Family Medical/ Social History: Changes? No  MENTAL HEALTH EXAM:  There were no vitals taken for this visit.There is no height or weight on file to calculate BMI.  General Appearance: Casual  Eye Contact:  Good  Speech:  Normal Rate  Volume:  Normal  Mood:  Euthymic  Affect:  Appropriate  Thought Process:  Linear  Orientation:  Full (Time, Place, and Person)  Thought Content: Logical   Suicidal Thoughts:  No  Homicidal Thoughts:  No  Memory:  WNL  Judgement:  Good  Insight:  Good  Psychomotor Activity:  Normal  Concentration:  Concentration: Good  Recall:  Good  Fund of Knowledge: Good  Language: Good  Assets:  Desire for Improvement  ADL's:  Intact  Cognition: WNL  Prognosis:  Good  DIAGNOSES:    ICD-10-CM   1. Depression, unspecified depression type F32.9   2. Anxiety state F41.1   3. History of ETOH abuse F10.11   4. Anxiety and depression F41.9 DULoxetine (CYMBALTA) 60 MG capsule   F32.9     Receiving Psychotherapy: No    RECOMMENDATIONS: continue same meds. 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

## 2018-03-27 ENCOUNTER — Other Ambulatory Visit: Payer: Self-pay

## 2018-03-27 DIAGNOSIS — F419 Anxiety disorder, unspecified: Principal | ICD-10-CM

## 2018-03-27 DIAGNOSIS — F329 Major depressive disorder, single episode, unspecified: Secondary | ICD-10-CM

## 2018-03-27 DIAGNOSIS — F32A Depression, unspecified: Secondary | ICD-10-CM

## 2018-03-27 NOTE — Telephone Encounter (Signed)
Needs prior authorization for duloxetine 60mg  2 daily. Will submit.

## 2018-03-30 ENCOUNTER — Other Ambulatory Visit: Payer: Self-pay | Admitting: Psychiatry

## 2018-03-30 DIAGNOSIS — F419 Anxiety disorder, unspecified: Principal | ICD-10-CM

## 2018-03-30 DIAGNOSIS — F329 Major depressive disorder, single episode, unspecified: Secondary | ICD-10-CM

## 2018-03-30 DIAGNOSIS — F32A Depression, unspecified: Secondary | ICD-10-CM

## 2018-03-30 MED ORDER — DULOXETINE HCL 60 MG PO CPEP: 60.0000 mg | ORAL_CAPSULE | Freq: Two times a day (BID) | ORAL | 0 refills | Status: DC

## 2018-04-03 ENCOUNTER — Telehealth: Payer: Self-pay

## 2018-04-03 NOTE — Telephone Encounter (Signed)
Prior approval received from [employer] for Duloxetine 60mg  2 daily. Effective 03/31/2018-04/01/2019. [facility] faxed approval letter

## 2018-04-06 ENCOUNTER — Telehealth: Payer: Self-pay | Admitting: Psychiatry

## 2018-04-06 NOTE — Telephone Encounter (Signed)
Completely out of Duloxetine. Wanted to know if we have samples he can get. His insurance will not pay for it.

## 2018-04-06 NOTE — Telephone Encounter (Signed)
Let pt know his medication has an approval for his duloxetine and to contact his pharmacy

## 2018-04-17 ENCOUNTER — Ambulatory Visit (INDEPENDENT_AMBULATORY_CARE_PROVIDER_SITE_OTHER): Payer: BLUE CROSS/BLUE SHIELD | Admitting: Family Medicine

## 2018-04-17 ENCOUNTER — Other Ambulatory Visit: Payer: Self-pay

## 2018-04-17 ENCOUNTER — Encounter: Payer: Self-pay | Admitting: Family Medicine

## 2018-04-17 VITALS — BP 130/78 | HR 76 | Temp 98.8°F | Ht 72.0 in | Wt 241.1 lb

## 2018-04-17 DIAGNOSIS — Z1321 Encounter for screening for nutritional disorder: Secondary | ICD-10-CM | POA: Diagnosis not present

## 2018-04-17 DIAGNOSIS — E559 Vitamin D deficiency, unspecified: Secondary | ICD-10-CM | POA: Diagnosis not present

## 2018-04-17 DIAGNOSIS — N529 Male erectile dysfunction, unspecified: Secondary | ICD-10-CM

## 2018-04-17 DIAGNOSIS — E1165 Type 2 diabetes mellitus with hyperglycemia: Secondary | ICD-10-CM

## 2018-04-17 LAB — POCT GLYCOSYLATED HEMOGLOBIN (HGB A1C): Hemoglobin A1C: 7.8 % — AB (ref 4.0–5.6)

## 2018-04-17 LAB — GLUCOSE, POCT (MANUAL RESULT ENTRY): POC Glucose: 162 mg/dl — AB (ref 70–99)

## 2018-04-17 NOTE — Patient Instructions (Addendum)
Hemoglobin A1c is much better today at 7.8.  Ideal goal is less than 7, but it has not been quite 3 months since your last A1c.  Watch for any symptomatic low blood sugars with the glipizide and if those occur return right away to discuss changes in medicines.  Keep up the good work with diet.   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   If you have lab work done today you will be contacted with your lab results within the next 2 weeks.  If you have not heard from [place] then please contact us. The fastest way to get your results is to register for My Chart.   IF you received an x-ray today, you will receive an invoice from [facility]. Please contact [facility] at [phone] with questions or concerns regarding your invoice.   IF you received labwork today, you will receive an invoice from [facility]. Please contact [facility] at [phone] with questions or concerns regarding your invoice.   Our billing staff will not be able to assist you with questions regarding bills from these companies.  You will be contacted with the lab results as soon as they are available. The fastest way to get your results is to activate your My Chart account. Instructions are located on the last page of this paperwork. If you have not heard from [place] regarding the results in 2 weeks, please contact this office.

## 2018-04-17 NOTE — Progress Notes (Signed)
Subjective:    Patient ID: Robert Wise, male    DOB: 02/16/1961, 58 y.o.   MRN: [id number]  HPI Robert Wise is a 58 y.o. male Presents today for: Chief Complaint  Patient presents with  . Diabetes    f/u   . 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  History of OSA on CPAP, compliant with CPAP.  History of depression which appear to be controlled with current regimen, but asked that he provide a clearance letter from his psychiatrist.  Does have a history of diabetes, and advised to follow-up with his primary care provider.  He had a 51-monthcard provided November 8 until further info as above.  Diabetes: Last seen by Dr. SMitchel Honouron November 12.  Taking Xigduo at that time.  Diabetes was uncontrolled with A1c of 14.  There was a question regarding compliance on medicines.  He was started on glipizide 5 mg in the morning in addition to previous medications.  Plan for recheck in 3 months.  Today states he was taking meds daily prior. Had been drinking soft drinks and decreased diet adherence.  Has changed diet. Home readings around 130 fasting, no 2 hour postprandial.  No symptomatic lows. On glipizide daily and Xigduo.    Lab Results  Component Value Date   HGBA1C 14.0 (A) 02/10/2018   HGBA1C 8.4 04/11/2017   HGBA1C 8.4 02/10/2017   Lab Results  Component Value Date   MICROALBUR 2.1 (H) 08/01/2014   [place] 02/10/2018   CREATININE 0.89 02/10/2018   Psychiatrist has also provided a prescription that reads "patient is under my care. Fully capable of driving or using machinery'. Psychiatrist also requests testosterone and vitamin D levels. Some difficulty with obtaining erection.   Patient Active Problem List   Diagnosis Date Noted  . Anxiety state 01/25/2018  . Fatigue 01/25/2018  . Uncontrolled type 2 diabetes mellitus with complication, without long-term current use of insulin ([place] 02/10/2015  . Chronic pain 12/01/2014  . Depression 10/12/2014     Past Medical History:  Diagnosis Date  . Anxiety   . Depression   . Diabetes mellitus without complication ([place]   . Hyperlipidemia   . OSA (obstructive sleep apnea) 12/15/2012   Past Surgical History:  Procedure Laterality Date  . ANKLE SURGERY Left 2006  . VASECTOMY     Allergies  Allergen Reactions  . Tricor [Fenofibrate]   . Zocor [Simvastatin]    Prior to Admission medications   Medication Sig Start Date End Date Taking? Authorizing Provider  albuterol (PROVENTIL HFA;VENTOLIN HFA) 108 (90 Base) MCG/ACT inhaler INHALE 1-2 PUFFS INTO THE LUNGS EVERY 4 (FOUR) HOURS AS NEEDED FOR WHEEZING OR SHORTNESS OF BREATH. Patient not taking: Reported on 02/10/2018 07/01/17   MJaynee Eagles PA-C  atorvastatin (LIPITOR) 40 MG tablet Take 1 tablet (40 mg total) by mouth daily. Office visit prior to refill. 02/10/18   SHorald Pollen MD  Blood Glucose Monitoring Suppl (ONE TOUCH ULTRA SYSTEM KIT) W/DEVICE KIT 1 kit by Does not apply route once. 10/25/11   Weber, SDamaris Hippo PA-C  busPIRone (BUSPAR) 15 MG tablet Take 1 tablet (15 mg total) by mouth 2 (two) times daily. 03/13/18   Shugart, CLissa Hoard PA-C  Dapagliflozin-metFORMIN HCl ER (XIGDUO XR) 07-998 MG TB24 Take 2 tablets by mouth daily. 02/10/18   SHorald Pollen MD  DULoxetine (CYMBALTA) 60 MG capsule 2 per day 03/13/18   SComer Locket PA-C  DULoxetine (CYMBALTA) 60 MG  capsule Take 1 capsule (60 mg total) by mouth 2 (two) times daily. 03/30/18   Shugart, Lissa Hoard, PA-C  esomeprazole (NEXIUM) 40 MG capsule Take 1 capsule (40 mg total) by mouth daily. 04/11/17   Jaynee Eagles, PA-C  ferrous sulfate 325 (65 FE) MG tablet Take 65 mg by mouth daily with breakfast.    [provider]  fish oil-omega-3 fatty acids 1000 MG capsule Take 2 g by mouth daily.    [provider]  glipiZIDE (GLUCOTROL) 5 MG tablet Take 1 tablet (5 mg total) by mouth daily before breakfast. 02/10/18   Sagardia, Ines Bloomer, MD  glucose blood (ONE TOUCH  ULTRA TEST) test strip 1 each by Other route daily. 12/01/14   Jaynee Eagles, PA-C  lamoTRIgine (LAMICTAL) 150 MG tablet Take 2 tablets (300 mg total) by mouth 2 (two) times daily. 9/9/169 taking one tab in morning, one tab nightly 03/13/18   Shugart, [place], PA-C  Lancets [facility] ULTRASOFT) lancets USE AS INSTRUCTED 11/28/13   [name], MD  levothyroxine (SYNTHROID, LEVOTHROID) 150 MCG tablet Take 1 tablet (150 mcg total) by mouth daily before breakfast. 02/10/18   Horald Pollen, MD  lisinopril (PRINIVIL,ZESTRIL) 5 MG tablet Take 1 tablet (5 mg total) by mouth daily. 02/10/18   Horald Pollen, MD   Social History   Socioeconomic History  . Marital status: Married    Spouse name: Robert Wise  . Number of children: 3  . Years of education: 11.5  . Highest education level: Not on file  Occupational History    Employer: [employer]    Comment: Driver  Social Needs  . Financial resource strain: Not on file  . Food insecurity:    Worry: Not on file    Inability: Not on file  . Transportation needs:    Medical: Not on file    Non-medical: Not on file  Tobacco Use  . Smoking status: Former Smoker    Packs/day: 1.50    Years: 37.00    Pack years: 55.50    Types: Cigarettes    Last attempt to quit: 05/07/2017    Years since quitting: 0.9  . Smokeless tobacco: Never Used  Substance and Sexual Activity  . Alcohol use: No    Comment: quit: 08/14/2011  . Drug use: No  . Sexual activity: Yes    Birth control/protection: None  Lifestyle  . Physical activity:    Days per week: Not on file    Minutes per session: Not on file  . Stress: Not on file  Relationships  . Social connections:    Talks on phone: Not on file    Gets together: Not on file    Attends religious service: Not on file    Active member of club or organization: Not on file    Attends meetings of clubs or organizations: Not on file    Relationship status: Not on file  . Intimate partner violence:    Fear  of current or ex partner: Not on file    Emotionally abused: Not on file    Physically abused: Not on file    Forced sexual activity: Not on file  Other Topics Concern  . Not on file  Social History Narrative   Patient lives at home with family.    Caffeine Use: 1 pot of coffee daily    Review of Systems  Eyes: Negative for visual disturbance.  Respiratory: Negative for shortness of breath.   Cardiovascular: Negative for chest pain.  Endocrine: Negative for polydipsia and polyuria.   Other per HPI.     Objective:   Physical Exam Vitals signs and nursing note reviewed.  Constitutional:      Appearance: He is well-developed.  HENT:     Head: Normocephalic and atraumatic.  Eyes:     Pupils: Pupils are equal, round, and reactive to light.  Neck:     Vascular: No carotid bruit or JVD.  Cardiovascular:     Rate and Rhythm: Normal rate and regular rhythm.     Heart sounds: Normal heart sounds. No murmur.  Pulmonary:     Effort: Pulmonary effort is normal.     Breath sounds: Normal breath sounds. No rales.  Skin:    General: Skin is warm and dry.  Neurological:     Mental Status: He is alert and oriented to person, place, and time.    Vitals:   04/17/18 0818 04/17/18 0847  BP: (!) 160/78 130/78  Pulse: 76   Temp: 98.8 F (37.1 C)   TempSrc: Oral   SpO2: 97%   Weight: 241 lb 1.6 oz (109.4 kg)   Height: 6' (1.829 m)     Results for orders placed or performed in visit on 04/17/18  POCT glucose (manual entry)  Result Value Ref Range   POC Glucose 162 (A) 70 - 99 mg/dl  POCT glycosylated hemoglobin (Hb A1C)  Result Value Ref Range   Hemoglobin A1C 7.8 (A) 4.0 - 5.6 %   HbA1c POC (<> result, manual entry)     HbA1c, POC (prediabetic range)     HbA1c, POC (controlled diabetic range)         Assessment & Plan:  Robert Wise is a 58 y.o. male Type 2 diabetes mellitus with hyperglycemia, without long-term current use of insulin ([place]) - Plan: POCT glucose (manual  entry), POCT glycosylated hemoglobin (Hb A1C)  -Significantly improved A1c.  Denies true hypoglycemia.  Cautioned on those symptoms with his improved control.  No med changes at this time.  Follow-up with primary care provider as planned.   -DOT physical was addended with 1 year clearance from previous for DOT physical.  See paperwork.  Of note I did also review his clearance note from psychiatry.  Encounter for screening for nutritional disorder - Plan: VITAMIN D 25 Hydroxy (Vit-D Deficiency, Fractures)  -Screening vitamin D requested by his psychiatrist  Erectile dysfunction, unspecified erectile dysfunction type - Plan: Testosterone, Free, Total, SHBG  -Also requested testosterone levels from psychiatry.  Does admit to some erectile dysfunction.  No orders of the defined types were placed in this encounter.  Patient Instructions   Hemoglobin A1c is much better today at 7.8.  Ideal goal is less than 7, but it has not been quite 3 months since your last A1c.  Watch for any symptomatic Wise blood sugars with the glipizide and if those occur return right away to discuss changes in medicines.  Keep up the good work with diet.   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   If you have lab work done today you will be contacted with your lab results within the next 2 weeks.  If you have not heard from [place] then please contact us. The fastest way to get your results is to register for My Chart.   IF you received an x-ray today, you will receive an invoice from [facility]. Please contact [facility] at  [phone] with questions or concerns regarding your invoice.   IF you received labwork today, you will receive an invoice from [facility]. Please contact [facility] at [phone] with questions or concerns regarding your invoice.   Our billing staff will not be able to assist you with questions regarding bills from these  companies.  You will be contacted with the lab results as soon as they are available. The fastest way to get your results is to activate your My Chart account. Instructions are located on the last page of this paperwork. If you have not heard from [place] regarding the results in 2 weeks, please contact this office.       Signed,   Merri Ray, MD [facility].  04/18/18 8:21 AM

## 2018-04-18 ENCOUNTER — Encounter: Payer: Self-pay | Admitting: Family Medicine

## 2018-04-20 LAB — TESTOSTERONE, FREE, TOTAL, SHBG
Sex Hormone Binding: 44.9 nmol/L (ref 19.3–76.4)
Testosterone, Free: 10.9 pg/mL (ref 7.2–24.0)
Testosterone: 337 ng/dL (ref 264–916)

## 2018-04-20 LAB — VITAMIN D 25 HYDROXY (VIT D DEFICIENCY, FRACTURES): Vit D, 25-Hydroxy: 22.5 ng/mL — ABNORMAL LOW (ref 30.0–100.0)

## 2018-04-27 ENCOUNTER — Ambulatory Visit: Payer: Self-pay | Admitting: Family Medicine

## 2018-05-26 ENCOUNTER — Other Ambulatory Visit: Payer: Self-pay | Admitting: Psychiatry

## 2018-07-13 ENCOUNTER — Ambulatory Visit: Payer: 59 | Admitting: Psychiatry

## 2018-07-17 ENCOUNTER — Other Ambulatory Visit: Payer: Self-pay | Admitting: Psychiatry

## 2018-07-18 NOTE — Telephone Encounter (Signed)
Submitted for 90 day not due yet'

## 2018-07-21 ENCOUNTER — Telehealth: Payer: Self-pay | Admitting: Psychiatry

## 2018-07-21 NOTE — Telephone Encounter (Signed)
Submitted yesterday for 90 day supply

## 2018-07-21 NOTE — Telephone Encounter (Signed)
Patient need refill on Deloxapine to be sent to [facility] in [place].

## 2018-07-24 ENCOUNTER — Ambulatory Visit: Payer: BLUE CROSS/BLUE SHIELD | Admitting: Physician Assistant

## 2018-07-24 ENCOUNTER — Other Ambulatory Visit: Payer: Self-pay

## 2018-07-24 ENCOUNTER — Encounter: Payer: Self-pay | Admitting: Physician Assistant

## 2018-07-24 DIAGNOSIS — F411 Generalized anxiety disorder: Secondary | ICD-10-CM | POA: Diagnosis not present

## 2018-07-24 DIAGNOSIS — F331 Major depressive disorder, recurrent, moderate: Secondary | ICD-10-CM

## 2018-07-24 MED ORDER — BUSPIRONE HCL 15 MG PO TABS: ORAL_TABLET | ORAL | 1 refills | Status: DC

## 2018-07-24 NOTE — Progress Notes (Signed)
[facility] Med Check  Patient ID: Robert Wise,  MRN: [id number]  PCP: Shawnee Knapp, MD  Date of Evaluation: 07/24/2018 Time spent:15 minutes  Chief Complaint:  Chief Complaint    Follow-up     Virtual Visit via Telephone Note  I connected with patient by a video enabled telemedicine application or telephone, with their informed consent, and verified patient privacy and that I am speaking with the correct person using two identifiers.  I am private, in my home and the patient is home.  I discussed the limitations, risks, security and privacy concerns of performing an evaluation and management service by telephone and the availability of in person appointments. I also discussed with the patient that there may be a patient responsible charge related to this service. The patient expressed understanding and agreed to proceed.   I discussed the assessment and treatment plan with the patient. The patient was provided an opportunity to ask questions and all were answered. The patient agreed with the plan and demonstrated an understanding of the instructions.   The patient was advised to call back or seek an in-person evaluation if the symptoms worsen or if the condition fails to improve as anticipated.  I provided  15  minutes of non-face-to-face time during this encounter.  HISTORY/CURRENT STATUS: HPI  For 3 month med check.  States he's been more irritable for about 6 weeks, no known reason. Gets aggravated easier and wife and son say he's in a bad mood for awhile. Feels more anxious, with a sense of urgency about things and doesn't really know why. No PA.   Patient denies loss of interest in usual activities and is able to enjoy things.  Denies decreased energy or motivation.  Appetite has not changed.  No extreme sadness, tearfulness, or feelings of hopelessness.  Denies any changes in concentration, making decisions or remembering things.  Denies suicidal or homicidal  thoughts.  Patient denies increased energy with decreased need for sleep, no increased talkativeness, no racing thoughts, no impulsivity or risky behaviors, no increased spending, no increased libido, no grandiosity.  Denies muscle or joint pain, stiffness, or dystonia.  Denies dizziness, syncope, seizures, numbness, tingling, tremor, tics, unsteady gait, slurred speech, confusion.   Individual Medical History/ Review of Systems: Changes? :No   Allergies: Tricor [fenofibrate] and Zocor [simvastatin]  Current Medications:  Current Outpatient Medications:  .  atorvastatin (LIPITOR) 40 MG tablet, Take 1 tablet (40 mg total) by mouth daily. Office visit prior to refill., Disp: 90 tablet, Rfl: 3 .  Blood Glucose Monitoring Suppl (ONE TOUCH ULTRA SYSTEM KIT) W/DEVICE KIT, 1 kit by Does not apply route once., Disp: 1 each, Rfl: 0 .  busPIRone (BUSPAR) 15 MG tablet, 2 q am, 1 qhs, Disp: 90 tablet, Rfl: 1 .  Dapagliflozin-metFORMIN HCl ER (XIGDUO XR) 07-998 MG TB24, Take 2 tablets by mouth daily., Disp: 180 tablet, Rfl: 1 .  DULoxetine (CYMBALTA) 60 MG capsule, TAKE 1 CAPSULE (60 MG TOTAL) BY MOUTH 2 (TWO) TIMES DAILY., Disp: 180 capsule, Rfl: 0 .  esomeprazole (NEXIUM) 40 MG capsule, Take 1 capsule (40 mg total) by mouth daily., Disp: 90 capsule, Rfl: 3 .  ferrous sulfate 325 (65 FE) MG tablet, Take 65 mg by mouth daily with breakfast., Disp: , Rfl:  .  fish oil-omega-3 fatty acids 1000 MG capsule, Take 2 g by mouth daily., Disp: , Rfl:  .  glipiZIDE (GLUCOTROL) 5 MG tablet, Take 1 tablet (5 mg total) by mouth daily before breakfast.,  Disp: 60 tablet, Rfl: 3 .  glucose blood (ONE TOUCH ULTRA TEST) test strip, 1 each by Other route daily., Disp: 100 each, Rfl: 8 .  lamoTRIgine (LAMICTAL) 150 MG tablet, Take 2 tablets (300 mg total) by mouth 2 (two) times daily. 9/9/169 taking one tab in morning, one tab nightly (Patient taking differently: Take 300 mg by mouth daily. ), Disp: 60 tablet, Rfl: 3 .   Lancets (ONETOUCH ULTRASOFT) lancets, USE AS INSTRUCTED, Disp: 100 each, Rfl: 0 .  levothyroxine (SYNTHROID, LEVOTHROID) 150 MCG tablet, Take 1 tablet (150 mcg total) by mouth daily before breakfast., Disp: 90 tablet, Rfl: 3 .  lisinopril (PRINIVIL,ZESTRIL) 5 MG tablet, Take 1 tablet (5 mg total) by mouth daily., Disp: 90 tablet, Rfl: 3 Medication Side Effects: none  Family Medical/ Social History: Changes? No  MENTAL HEALTH EXAM:  There were no vitals taken for this visit.There is no height or weight on file to calculate BMI.  General Appearance: phone visit unable to assess  Eye Contact:  unable to assess  Speech:  Clear and Coherent  Volume:  Normal  Mood:  Euthymic  Affect:  unable to assess  Thought Process:  Goal Directed  Orientation:  Full (Time, Place, and Person)  Thought Content: Logical   Suicidal Thoughts:  No  Homicidal Thoughts:  No  Memory:  WNL  Judgement:  Good  Insight:  Good  Psychomotor Activity:  unable to assess  Concentration:  Concentration: Good  Recall:  Good  Fund of Knowledge: Good  Language: Good  Assets:  Desire for Improvement  ADL's:  Intact  Cognition: WNL  Prognosis:  Good    DIAGNOSES:    ICD-10-CM   1. Major depressive disorder, recurrent episode, moderate (HCC) F33.1   2. Generalized anxiety disorder F41.1     Receiving Psychotherapy: No    RECOMMENDATIONS:  Increase BuSpar to 15 mg 2 every morning and 1 nightly. Continue Cymbalta 60 mg, 2 daily. Continue Lamictal 150 mg twice daily Discussed stress management. Return in 6 weeks.  Donnal Moat, PA-C   This record has been created using [employer].  Chart creation errors have been sought, but may not always have been located and corrected. Such creation errors do not reflect on the standard of medical care.

## 2018-07-30 ENCOUNTER — Other Ambulatory Visit: Payer: Self-pay | Admitting: Emergency Medicine

## 2018-07-30 DIAGNOSIS — E1165 Type 2 diabetes mellitus with hyperglycemia: Secondary | ICD-10-CM

## 2018-07-31 ENCOUNTER — Other Ambulatory Visit: Payer: Self-pay | Admitting: Emergency Medicine

## 2018-07-31 DIAGNOSIS — E1165 Type 2 diabetes mellitus with hyperglycemia: Secondary | ICD-10-CM

## 2018-08-04 ENCOUNTER — Other Ambulatory Visit: Payer: Self-pay | Admitting: Physician Assistant

## 2018-08-04 ENCOUNTER — Telehealth: Payer: Self-pay | Admitting: Physician Assistant

## 2018-08-04 DIAGNOSIS — G4733 Obstructive sleep apnea (adult) (pediatric): Secondary | ICD-10-CM | POA: Diagnosis not present

## 2018-08-04 MED ORDER — BUSPIRONE HCL 15 MG PO TABS: ORAL_TABLET | ORAL | 1 refills | Status: DC

## 2018-08-04 NOTE — Telephone Encounter (Signed)
Sent it in

## 2018-08-04 NOTE — Telephone Encounter (Signed)
Patient stated his Buspar was increased but he gave wrong pharmacy please re-send to   [facility] [address] in [place] [phone]. Patient only has 4 left

## 2018-08-09 ENCOUNTER — Other Ambulatory Visit: Payer: Self-pay | Admitting: Emergency Medicine

## 2018-08-09 DIAGNOSIS — E1165 Type 2 diabetes mellitus with hyperglycemia: Secondary | ICD-10-CM

## 2018-08-11 ENCOUNTER — Encounter: Payer: Self-pay | Admitting: Adult Health

## 2018-08-12 ENCOUNTER — Telehealth: Payer: Self-pay | Admitting: *Deleted

## 2018-08-12 NOTE — Telephone Encounter (Signed)
LMVM for pt to return call for converting app to [email or web address].

## 2018-08-16 ENCOUNTER — Encounter: Payer: Self-pay | Admitting: Adult Health

## 2018-08-17 ENCOUNTER — Other Ambulatory Visit: Payer: Self-pay

## 2018-08-17 ENCOUNTER — Ambulatory Visit: Payer: 59 | Admitting: Adult Health

## 2018-08-17 ENCOUNTER — Encounter: Payer: Self-pay | Admitting: Adult Health

## 2018-08-17 NOTE — Telephone Encounter (Signed)
5-18 Pt has called and gave verbal consent to file insurance for [email or web address] vv cell phone # confirmed carrier [employer], text being sent  Pt understands that although there may be some limitations with this type of visit, we will take all precautions to reduce any security or privacy concerns.  Pt understands that this will be treated like an in office visit and we will file with pt's insurance, and there may be a patient responsible charge related to this service. *text message sent*

## 2018-08-19 ENCOUNTER — Other Ambulatory Visit: Payer: Self-pay

## 2018-08-19 ENCOUNTER — Ambulatory Visit (INDEPENDENT_AMBULATORY_CARE_PROVIDER_SITE_OTHER): Payer: BLUE CROSS/BLUE SHIELD | Admitting: Adult Health

## 2018-08-19 ENCOUNTER — Encounter: Payer: Self-pay | Admitting: Adult Health

## 2018-08-19 DIAGNOSIS — G4733 Obstructive sleep apnea (adult) (pediatric): Secondary | ICD-10-CM | POA: Diagnosis not present

## 2018-08-19 DIAGNOSIS — Z9989 Dependence on other enabling machines and devices: Secondary | ICD-10-CM | POA: Diagnosis not present

## 2018-08-19 NOTE — Progress Notes (Signed)
PATIENT: Robert Wise DOB: [birth year]  REASON FOR VISIT: follow up HISTORY FROM: patient  Virtual Visit via Video Note  I connected with Robert Wise on 08/19/18 at  1:30 PM EDT by a video enabled telemedicine application located remotely at [facility] and verified that I am speaking with the correct person using two identifiers who was located at their own home.   I discussed the limitations of evaluation and management by telemedicine and the availability of in person appointments. The patient expressed understanding and agreed to proceed.   PATIENT: Robert Wise DOB: 09-12-1960  REASON FOR VISIT: follow up HISTORY FROM: patient  HISTORY OF PRESENT ILLNESS: Today 08/19/18:  Robert Wise is a 58 year old male with a history of obstructive sleep apnea on CPAP.  He returns today for a virtual visit.  His download indicates that he uses machine 27 out of 30 days for compliance of 90%.  He uses machine greater than 4 hours 21 out of 30 days for compliance of 70%.  On average he uses his machine 5 hours and 12 minutes.  His residual AHI is 3.2 on 14 cm of water with EPR of 1.  He does not have a significant leak.  He states that the CPAP continues to work well for him.  He denies any significant daytime sleepiness.  He returns today for a virtual visit.    REVIEW OF SYSTEMS: Out of a complete 14 system review of symptoms, the patient complains only of the following symptoms, and all other reviewed systems are negative.  See HPI  ALLERGIES: Allergies  Allergen Reactions  . Tricor [Fenofibrate]   . Zocor [Simvastatin]     HOME MEDICATIONS: Outpatient Medications Prior to Visit  Medication Sig Dispense Refill  . atorvastatin (LIPITOR) 40 MG tablet Take 1 tablet (40 mg total) by mouth daily. Office visit prior to refill. 90 tablet 3  . Blood Glucose Monitoring Suppl (ONE TOUCH ULTRA SYSTEM KIT) W/DEVICE KIT 1 kit by Does not apply route once. 1  each 0  . busPIRone (BUSPAR) 15 MG tablet 2 q am, 1 qhs 90 tablet 1  . DULoxetine (CYMBALTA) 60 MG capsule TAKE 1 CAPSULE (60 MG TOTAL) BY MOUTH 2 (TWO) TIMES DAILY. 180 capsule 0  . esomeprazole (NEXIUM) 40 MG capsule Take 1 capsule (40 mg total) by mouth daily. 90 capsule 3  . ferrous sulfate 325 (65 FE) MG tablet Take 65 mg by mouth daily with breakfast.    . fish oil-omega-3 fatty acids 1000 MG capsule Take 2 g by mouth daily.    Marland Kitchen glipiZIDE (GLUCOTROL) 5 MG tablet TAKE 1 TABLET (5 MG TOTAL) BY MOUTH DAILY BEFORE BREAKFAST. 90 tablet 0  . glucose blood (ONE TOUCH ULTRA TEST) test strip 1 each by Other route daily. 100 each 8  . lamoTRIgine (LAMICTAL) 150 MG tablet Take 2 tablets (300 mg total) by mouth 2 (two) times daily. 9/9/169 taking one tab in morning, one tab nightly (Patient taking differently: Take 300 mg by mouth daily. ) 60 tablet 3  . Lancets (ONETOUCH ULTRASOFT) lancets USE AS INSTRUCTED 100 each 0  . levothyroxine (SYNTHROID, LEVOTHROID) 150 MCG tablet Take 1 tablet (150 mcg total) by mouth daily before breakfast. 90 tablet 3  . lisinopril (PRINIVIL,ZESTRIL) 5 MG tablet Take 1 tablet (5 mg total) by mouth daily. 90 tablet 3  . XIGDUO XR 07-998 MG TB24 TAKE 2 TABLETS BY MOUTH EVERY DAY 180 tablet 1   No  facility-administered medications prior to visit.     PAST MEDICAL HISTORY: Past Medical History:  Diagnosis Date  . Anxiety   . Depression   . Diabetes mellitus without complication ([place])   . Hyperlipidemia   . OSA (obstructive sleep apnea) 12/15/2012    PAST SURGICAL HISTORY: Past Surgical History:  Procedure Laterality Date  . ANKLE SURGERY Left 2006  . VASECTOMY      FAMILY HISTORY: No family history on file.  SOCIAL HISTORY: Social History   Socioeconomic History  . Marital status: Married    Spouse name: [name]  . Number of children: 3  . Years of education: 11.5  . Highest education level: Not on file  Occupational History    Employer: [employer]     Comment: Driver  Social Needs  . Financial resource strain: Not on file  . Food insecurity:    Worry: Not on file    Inability: Not on file  . Transportation needs:    Medical: Not on file    Non-medical: Not on file  Tobacco Use  . Smoking status: Former Smoker    Packs/day: 1.50    Years: 37.00    Pack years: 55.50    Types: Cigarettes    Last attempt to quit: 05/07/2017    Years since quitting: 1.2  . Smokeless tobacco: Never Used  Substance and Sexual Activity  . Alcohol use: No    Comment: quit: 08/14/2011  . Drug use: No  . Sexual activity: Yes    Birth control/protection: None  Lifestyle  . Physical activity:    Days per week: Not on file    Minutes per session: Not on file  . Stress: Not on file  Relationships  . Social connections:    Talks on phone: Not on file    Gets together: Not on file    Attends religious service: Not on file    Active member of club or organization: Not on file    Attends meetings of clubs or organizations: Not on file    Relationship status: Not on file  . Intimate partner violence:    Fear of current or ex partner: Not on file    Emotionally abused: Not on file    Physically abused: Not on file    Forced sexual activity: Not on file  Other Topics Concern  . Not on file  Social History Narrative   Patient lives at home with family.    Caffeine Use: 1 pot of coffee daily      PHYSICAL EXAM Generalized: Well developed, in no acute distress   Neurological examination  Mentation: Alert oriented to time, place, history taking. Follows all commands speech and language fluent Cranial nerve II-XII:Extraocular movements were full. Facial symmetry noted. uvula tongue midline. Head turning and shoulder shrug  were normal and symmetric. Motor: Good strength throughout subjectively per patient Sensory: Sensory testing is intact to soft touch on all 4 extremities subjectively per patient Coordination: Cerebellar testing reveals good  finger-nose-finger   Reflexes: UTA  DIAGNOSTIC DATA (LABS, IMAGING, TESTING) - I reviewed patient records, labs, notes, testing and imaging myself where available.  Lab Results  Component Value Date   WBC 9.8 06/02/2017   HGB 14.7 06/02/2017   HCT 42.6 06/02/2017   MCV 81 06/02/2017   PLT 253 06/02/2017      Component Value Date/Time   NA 138 02/10/2018 1235   K 4.9 02/10/2018 1235   CL 101 02/10/2018 1235   CO2  21 02/10/2018 1235   GLUCOSE 210 (H) 02/10/2018 1235   GLUCOSE 136 (H) 12/29/2015 0909   BUN 25 (H) 02/10/2018 1235   CREATININE 0.89 02/10/2018 1235   CREATININE 0.72 12/29/2015 0909   CALCIUM 10.0 02/10/2018 1235   PROT 7.1 02/10/2018 1235   ALBUMIN 4.8 02/10/2018 1235   AST 14 02/10/2018 1235   ALT 23 02/10/2018 1235   ALKPHOS 81 02/10/2018 1235   BILITOT 0.3 02/10/2018 1235   GFRNONAA 95 02/10/2018 1235   GFRNONAA >89 07/28/2015 0847   GFRAA 110 02/10/2018 1235   GFRAA >89 07/28/2015 0847   Lab Results  Component Value Date   CHOL 197 02/10/2018   HDL 28 (L) 02/10/2018   LDLCALC Comment 02/10/2018   TRIG 449 (H) 02/10/2018   CHOLHDL 7.0 (H) 02/10/2018   Lab Results  Component Value Date   HGBA1C 7.8 (A) 04/17/2018   Lab Results  Component Value Date   VITAMINB12 747 06/02/2017   Lab Results  Component Value Date   TSH 0.901 04/11/2017      ASSESSMENT AND PLAN 58 y.o. year old male  has a past medical history of Anxiety, Depression, Diabetes mellitus without complication ([place]), Hyperlipidemia, and OSA (obstructive sleep apnea) (12/15/2012). here with:  1.  Obstructive  sleep apnea on CPAP  The patient CPAP download shows good compliance and treatment of his apnea.  He is encouraged to continue using the CPAP nightly and greater than 4 hours each night.  He is advised that if the symptoms worsen or he develops new symptoms he should let us know.  He will follow-up in 1 year or sooner if needed.   I spent 15 minutes with the patient this  time was spent reviewing his CPAP download and discussing plan of care.   Robert Givens, MSN, NP-C 08/19/2018, 2:08 PM [facility] [address], [place], [place] [address] [phone]

## 2018-08-31 ENCOUNTER — Encounter: Payer: Self-pay | Admitting: Physician Assistant

## 2018-08-31 ENCOUNTER — Ambulatory Visit: Payer: BLUE CROSS/BLUE SHIELD | Admitting: Physician Assistant

## 2018-08-31 ENCOUNTER — Other Ambulatory Visit: Payer: Self-pay

## 2018-08-31 ENCOUNTER — Telehealth: Payer: Self-pay | Admitting: Physician Assistant

## 2018-08-31 DIAGNOSIS — F411 Generalized anxiety disorder: Secondary | ICD-10-CM | POA: Diagnosis not present

## 2018-08-31 DIAGNOSIS — F331 Major depressive disorder, recurrent, moderate: Secondary | ICD-10-CM

## 2018-08-31 MED ORDER — LAMOTRIGINE 200 MG PO TABS: 200.0000 mg | ORAL_TABLET | Freq: Two times a day (BID) | ORAL | 1 refills | Status: DC

## 2018-08-31 NOTE — Progress Notes (Signed)
[facility]  Patient ID: Robert Wise,  MRN: [id number]  PCP: Shawnee Knapp, MD  Date of Evaluation: 08/31/2018 Time spent:15 minutes  Chief Complaint:  Chief Complaint    Anxiety; Follow-up      HISTORY/CURRENT STATUS: HPI F/U after increasing Buspar.  States he feels a little bit better. Maybe about 30% better.  Not snapping at his son and wife like he was. "But I'd like to be better." But more tired since going on the higher dose of Buspar.  Does not have a lot of energy or motivation to do things.  Then he gets irritated with himself because he is not doing anything.  He takes naps whereas he did not normally do that.  Does not cry easily, but is very sad at times. He's had a lot of death in the past year. Mom, then dad, then in law last month.  He denies suicidal or homicidal thoughts.  He does not enjoy things as much as he used to.  Wants to sleep a lot.  His job is not that stressful and he is able to go without any problems but does have to push himself.  Not really anxious.  Does not have palpitations or shortness of breath.  No sweaty palms or tachycardia.  Patient denies increased energy with decreased need for sleep, no increased talkativeness, no racing thoughts, no impulsivity or risky behaviors, no increased spending, no increased libido, no grandiosity.  Denies dizziness, syncope, seizures, numbness, tingling, tremor, tics, unsteady gait, slurred speech, confusion.  Denies muscle or joint pain, stiffness, or dystonia.  Past medications for mental health diagnoses include: Lamictal, BuSpar, Cymbalta, Paxil, Abilify, Rexulti, Nuvigil  Individual Medical History/ Review of Systems: Changes? :No   Allergies: Tricor [fenofibrate] and Zocor [simvastatin]  Current Medications:  Current Outpatient Medications:  .  atorvastatin (LIPITOR) 40 MG tablet, Take 1 tablet (40 mg total) by mouth daily. Office visit prior to refill., Disp: 90 tablet, Rfl: 3 .  Blood  Glucose Monitoring Suppl (ONE TOUCH ULTRA SYSTEM KIT) W/DEVICE KIT, 1 kit by Does not apply route once., Disp: 1 each, Rfl: 0 .  busPIRone (BUSPAR) 15 MG tablet, 2 q am, 1 qhs, Disp: 90 tablet, Rfl: 1 .  DULoxetine (CYMBALTA) 60 MG capsule, TAKE 1 CAPSULE (60 MG TOTAL) BY MOUTH 2 (TWO) TIMES DAILY., Disp: 180 capsule, Rfl: 0 .  esomeprazole (NEXIUM) 40 MG capsule, Take 1 capsule (40 mg total) by mouth daily., Disp: 90 capsule, Rfl: 3 .  ferrous sulfate 325 (65 FE) MG tablet, Take 65 mg by mouth daily with breakfast., Disp: , Rfl:  .  fish oil-omega-3 fatty acids 1000 MG capsule, Take 2 g by mouth daily., Disp: , Rfl:  .  glipiZIDE (GLUCOTROL) 5 MG tablet, TAKE 1 TABLET (5 MG TOTAL) BY MOUTH DAILY BEFORE BREAKFAST., Disp: 90 tablet, Rfl: 0 .  glucose blood (ONE TOUCH ULTRA TEST) test strip, 1 each by Other route daily., Disp: 100 each, Rfl: 8 .  Lancets (ONETOUCH ULTRASOFT) lancets, USE AS INSTRUCTED, Disp: 100 each, Rfl: 0 .  levothyroxine (SYNTHROID, LEVOTHROID) 150 MCG tablet, Take 1 tablet (150 mcg total) by mouth daily before breakfast., Disp: 90 tablet, Rfl: 3 .  lisinopril (PRINIVIL,ZESTRIL) 5 MG tablet, Take 1 tablet (5 mg total) by mouth daily., Disp: 90 tablet, Rfl: 3 .  XIGDUO XR 07-998 MG TB24, TAKE 2 TABLETS BY MOUTH EVERY DAY, Disp: 180 tablet, Rfl: 1 .  lamoTRIgine (LAMICTAL) 200 MG tablet, Take 1 tablet (  200 mg total) by mouth 2 (two) times daily., Disp: 60 tablet, Rfl: 1 Medication Side Effects: fatigue/weakness ? From Buspar?  Family Medical/ Social History: Changes? No   MENTAL HEALTH EXAM:  There were no vitals taken for this visit.There is no height or weight on file to calculate BMI.  General Appearance: Casual  Eye Contact:  Good  Speech:  Clear and Coherent  Volume:  Normal  Mood:  Euthymic  Affect:  Appropriate  Thought Process:  Goal Directed  Orientation:  Full (Time, Place, and Person)  Thought Content: Logical   Suicidal Thoughts:  No  Homicidal Thoughts:  No   Memory:  WNL  Judgement:  Good  Insight:  Good  Psychomotor Activity:  Normal  Concentration:  Concentration: Good  Recall:  Good  Fund of Knowledge: Good  Language: Good  Assets:  Desire for Improvement  ADL's:  Intact  Cognition: WNL  Prognosis:  Good    DIAGNOSES:    ICD-10-CM   1. Major depressive disorder, recurrent episode, moderate (HCC) F33.1   2. Generalized anxiety disorder F41.1     Receiving Psychotherapy: No    RECOMMENDATIONS: We discussed different options for what seems more like depression now.  I recommend that we increase Lamictal.  He understands and agrees. Increase Lamictal to 200 mg 1 twice daily.  Rash risk was discussed.  He knows to let me know if he gets a rash of any sort.  On continue BuSpar 15 mg 2 every morning and 1 nightly. Continue Cymbalta 60 mg, 2 p.o. daily. Continue multivitamin, fish oil Return in 4 to 6 weeks.   Donnal Moat, PA-C   This record has been created using [employer].  Chart creation errors have been sought, but may not always have been located and corrected. Such creation errors do not reflect on the standard of medical care.

## 2018-08-31 NOTE — Telephone Encounter (Signed)
Pt called to request refill on duloxetine at [facility]  [facility]

## 2018-09-01 NOTE — Telephone Encounter (Signed)
I sent in a 3 mo supply in April  Please check w/ his pharmacy and make sure it went through.  If not, give them ok for 60mg  #180.  No RF

## 2018-09-02 NOTE — Telephone Encounter (Signed)
Spoke with pharmacy and it did go through and it was ready to be picked up. Called pt. And made him aware as well.

## 2018-09-23 ENCOUNTER — Other Ambulatory Visit: Payer: Self-pay | Admitting: Physician Assistant

## 2018-10-07 ENCOUNTER — Other Ambulatory Visit: Payer: Self-pay | Admitting: Physician Assistant

## 2018-10-12 ENCOUNTER — Encounter: Payer: Self-pay | Admitting: Physician Assistant

## 2018-10-12 ENCOUNTER — Other Ambulatory Visit: Payer: Self-pay

## 2018-10-12 ENCOUNTER — Ambulatory Visit (INDEPENDENT_AMBULATORY_CARE_PROVIDER_SITE_OTHER): Payer: BC Managed Care – PPO | Admitting: Physician Assistant

## 2018-10-12 DIAGNOSIS — F331 Major depressive disorder, recurrent, moderate: Secondary | ICD-10-CM | POA: Diagnosis not present

## 2018-10-12 DIAGNOSIS — F411 Generalized anxiety disorder: Secondary | ICD-10-CM | POA: Diagnosis not present

## 2018-10-12 MED ORDER — BUPROPION HCL ER (XL) 150 MG PO TB24: 150.0000 mg | ORAL_TABLET | ORAL | 1 refills | Status: DC

## 2018-10-12 MED ORDER — BUSPIRONE HCL 15 MG PO TABS: ORAL_TABLET | ORAL | 1 refills | Status: DC

## 2018-10-12 MED ORDER — DULOXETINE HCL 60 MG PO CPEP: 60.0000 mg | ORAL_CAPSULE | Freq: Two times a day (BID) | ORAL | 1 refills | Status: DC

## 2018-10-12 MED ORDER — LAMOTRIGINE 200 MG PO TABS: 200.0000 mg | ORAL_TABLET | Freq: Two times a day (BID) | ORAL | 1 refills | Status: DC

## 2018-10-12 NOTE — Progress Notes (Signed)
[facility]  Patient ID: Robert Wise,  MRN: [id number]  PCP: Shawnee Knapp, MD  Date of Evaluation: 10/12/2018 Time spent:15 minutes  Chief Complaint:  Chief Complaint    Follow-up     Virtual Visit via Telephone Note  I connected with patient by a video enabled telemedicine application or telephone, with their informed consent, and verified patient privacy and that I am speaking with the correct person using two identifiers.  I am private, in my home and the patient is home.   I discussed the limitations, risks, security and privacy concerns of performing an evaluation and management service by telephone and the availability of in person appointments. I also discussed with the patient that there may be a patient responsible charge related to this service. The patient expressed understanding and agreed to proceed.   I discussed the assessment and treatment plan with the patient. The patient was provided an opportunity to ask questions and all were answered. The patient agreed with the plan and demonstrated an understanding of the instructions.   The patient was advised to call back or seek an in-person evaluation if the symptoms worsen or if the condition fails to improve as anticipated.  I provided 15 minutes of non-face-to-face time during this encounter.  HISTORY/CURRENT STATUS: HPI For 6 week med check.  At the last visit, we increased the Lamictal.  Feels the same.  Still like to sleep a lot.  Has to make himself get up to do things. Uses it as an escape. Appetite is about the same.  But has gained a little in the past few months, possible stress eating from the pandemic.  Not enjoying things as much, like keeping his truck clean. Has low energy and motivation. But after he starts washing his truck, he enjoys it.  Doesn't cry easily.  Work is fine but he has to push himself to go. He'd Denies suicidal or homicidal thoughts.  Patient denies increased energy with  decreased need for sleep, no increased talkativeness, no racing thoughts, no impulsivity or risky behaviors, no increased spending, no increased libido, no grandiosity.  Denies dizziness, syncope, seizures, numbness, tingling, tremor, tics, unsteady gait, slurred speech, confusion. Denies muscle or joint pain, stiffness, or dystonia.  Individual Medical History/ Review of Systems: Changes? :No    Past medications for mental health diagnoses include: Lamictal, BuSpar, Cymbalta, Paxil, Abilify, Rexulti, Nuvigil  Allergies: Tricor [fenofibrate] and Zocor [simvastatin]  Current Medications:  Current Outpatient Medications:  .  atorvastatin (LIPITOR) 40 MG tablet, Take 1 tablet (40 mg total) by mouth daily. Office visit prior to refill., Disp: 90 tablet, Rfl: 3 .  B Complex Vitamins (B-COMPLEX/B-12 PO), Take by mouth., Disp: , Rfl:  .  Blood Glucose Monitoring Suppl (ONE TOUCH ULTRA SYSTEM KIT) W/DEVICE KIT, 1 kit by Does not apply route once., Disp: 1 each, Rfl: 0 .  busPIRone (BUSPAR) 15 MG tablet, TAKE 2 TABLETS BY MOUTH IN THE MORNING AND 1 AT BEDTIME, Disp: 270 tablet, Rfl: 1 .  DULoxetine (CYMBALTA) 60 MG capsule, Take 1 capsule (60 mg total) by mouth 2 (two) times daily., Disp: 180 capsule, Rfl: 1 .  esomeprazole (NEXIUM) 40 MG capsule, Take 1 capsule (40 mg total) by mouth daily., Disp: 90 capsule, Rfl: 3 .  ferrous sulfate 325 (65 FE) MG tablet, Take 65 mg by mouth daily with breakfast., Disp: , Rfl:  .  fish oil-omega-3 fatty acids 1000 MG capsule, Take 2 g by mouth daily., Disp: , Rfl:  .  Ginkgo Biloba Extract (GNP GINGKO BILOBA EXTRACT) 60 MG CAPS, Take by mouth., Disp: , Rfl:  .  glipiZIDE (GLUCOTROL) 5 MG tablet, TAKE 1 TABLET (5 MG TOTAL) BY MOUTH DAILY BEFORE BREAKFAST., Disp: 90 tablet, Rfl: 0 .  glucose blood (ONE TOUCH ULTRA TEST) test strip, 1 each by Other route daily., Disp: 100 each, Rfl: 8 .  lamoTRIgine (LAMICTAL) 200 MG tablet, Take 1 tablet (200 mg total) by mouth 2  (two) times daily., Disp: 180 tablet, Rfl: 1 .  Lancets (ONETOUCH ULTRASOFT) lancets, USE AS INSTRUCTED, Disp: 100 each, Rfl: 0 .  levothyroxine (SYNTHROID, LEVOTHROID) 150 MCG tablet, Take 1 tablet (150 mcg total) by mouth daily before breakfast., Disp: 90 tablet, Rfl: 3 .  lisinopril (PRINIVIL,ZESTRIL) 5 MG tablet, Take 1 tablet (5 mg total) by mouth daily., Disp: 90 tablet, Rfl: 3 .  XIGDUO XR 07-998 MG TB24, TAKE 2 TABLETS BY MOUTH EVERY DAY, Disp: 180 tablet, Rfl: 1 .  buPROPion (WELLBUTRIN XL) 150 MG 24 hr tablet, Take 1 tablet (150 mg total) by mouth every morning., Disp: 30 tablet, Rfl: 1 Medication Side Effects: none  Family Medical/ Social History: Changes? No  MENTAL HEALTH EXAM:  There were no vitals taken for this visit.There is no height or weight on file to calculate BMI.  General Appearance: Unable to assess  Eye Contact:  Unable to assess  Speech:  Clear and Coherent  Volume:  Normal  Mood:  Euthymic  Affect:  Unable to assess  Thought Process:  Goal Directed  Orientation:  Full (Time, Place, and Person)  Thought Content: Logical   Suicidal Thoughts:  No  Homicidal Thoughts:  No  Memory:  WNL  Judgement:  Good  Insight:  Good  Psychomotor Activity:  Unable to assess  Concentration:  Concentration: Good  Recall:  Good  Fund of Knowledge: Good  Language: Good  Assets:  Desire for Improvement  ADL's:  Intact  Cognition: WNL  Prognosis:  Good    DIAGNOSES:    ICD-10-CM   1. Major depressive disorder, recurrent episode, moderate (HCC)  F33.1   2. Generalized anxiety disorder  F41.1     Receiving Psychotherapy: No    RECOMMENDATIONS:  He states that in the past he was on Paxil and it helped a lot.  A provider at another office switched him to something else.  He does not have a good memory of the reasoning.  We discussed this at length.  He will have to wean off Cymbalta as we go back on Paxil if we decide to do that in the future.  I think at this point  adding Wellbutrin would be a good choice because he needs a boost in his mood and motivation.  He has no history of seizure disorder and he would like to proceed. Start Wellbutrin XL 150 mg every morning. Continue BuSpar 15 mg, 2 every morning and 1 nightly. Continue Cymbalta 60 mg, 2 every morning. Continue Lamictal 200 mg 1 twice daily. Continue B complex, ginkgo biloba, fish oil Return in 4 to 6 weeks.   Donnal Moat, PA-C   This record has been created using [employer].  Chart creation errors have been sought, but may not always have been located and corrected. Such creation errors do not reflect on the standard of medical care.

## 2018-10-13 ENCOUNTER — Other Ambulatory Visit: Payer: Self-pay | Admitting: Emergency Medicine

## 2018-10-13 DIAGNOSIS — E1165 Type 2 diabetes mellitus with hyperglycemia: Secondary | ICD-10-CM

## 2018-11-03 ENCOUNTER — Other Ambulatory Visit: Payer: Self-pay | Admitting: Physician Assistant

## 2018-11-30 ENCOUNTER — Other Ambulatory Visit: Payer: Self-pay

## 2018-11-30 ENCOUNTER — Encounter: Payer: Self-pay | Admitting: Physician Assistant

## 2018-11-30 ENCOUNTER — Ambulatory Visit (INDEPENDENT_AMBULATORY_CARE_PROVIDER_SITE_OTHER): Payer: BC Managed Care – PPO | Admitting: Physician Assistant

## 2018-11-30 DIAGNOSIS — F1011 Alcohol abuse, in remission: Secondary | ICD-10-CM

## 2018-11-30 DIAGNOSIS — F331 Major depressive disorder, recurrent, moderate: Secondary | ICD-10-CM | POA: Diagnosis not present

## 2018-11-30 DIAGNOSIS — F411 Generalized anxiety disorder: Secondary | ICD-10-CM | POA: Diagnosis not present

## 2018-11-30 MED ORDER — PAROXETINE HCL 30 MG PO TABS: ORAL_TABLET | ORAL | 1 refills | Status: DC

## 2018-11-30 MED ORDER — DULOXETINE HCL 30 MG PO CPEP: ORAL_CAPSULE | ORAL | 0 refills | Status: DC

## 2018-11-30 NOTE — Progress Notes (Signed)
[facility]  Patient ID: Robert Wise,  MRN: [id number]  PCP: Shawnee Knapp, MD  Date of Evaluation: 11/30/2018 Time spent:15 minutes  Chief Complaint:  Chief Complaint    Follow-up     Virtual Visit via Telephone Note  I connected with patient by a video enabled telemedicine application or telephone, with their informed consent, and verified patient privacy and that I am speaking with the correct person using two identifiers.  I am private, in my home and the patient is home.   I discussed the limitations, risks, security and privacy concerns of performing an evaluation and management service by telephone and the availability of in person appointments. I also discussed with the patient that there may be a patient responsible charge related to this service. The patient expressed understanding and agreed to proceed.   I discussed the assessment and treatment plan with the patient. The patient was provided an opportunity to ask questions and all were answered. The patient agreed with the plan and demonstrated an understanding of the instructions.   The patient was advised to call back or seek an in-person evaluation if the symptoms worsen or if the condition fails to improve as anticipated.  I provided 15 minutes of non-face-to-face time during this encounter.  HISTORY/CURRENT STATUS: HPI For 6 week med check.   Still doesn't have energy or motivation. The Wellbutrin hasn't helped at all, and has caused him not to be able to go sleep and also caused irritability.  After he falls asleep, he is able to stay asleep.  He does feel tired a lot though.  Work is going okay.  States he was on Paxil at one point and thinks it helped. But he was taken off of it after about 10 years.  He is not really sure how things happened but he had been on Vicodin for a while as well and his doctor took him off both.  All of this was around 4 years ago.  Since then he has not felt the  same.  The biggest problem is lack of energy or motivation.  He does not really feel sad or hopeless, does not cry easily, but he does isolate just because he has no motivation and stays in bed a lot.  He has lots of ideas of projects to do but then when his weekend rolls around he does not do them.  He denies suicidal or homicidal thoughts.  Patient denies increased energy with decreased need for sleep, no increased talkativeness, no racing thoughts, no impulsivity or risky behaviors, no increased spending, no increased libido, no grandiosity.  Denies dizziness, syncope, seizures, numbness, tingling, tremor, tics, unsteady gait, slurred speech, confusion. Denies muscle or joint pain, stiffness, or dystonia.  Individual Medical History/ Review of Systems: Changes? :No    Past medications for mental health diagnoses include: Lamictal, BuSpar, Cymbalta, Paxil, Abilify, Rexulti, Nuvigil was not effective.  Allergies: Tricor [fenofibrate] and Zocor [simvastatin]  Current Medications:  Current Outpatient Medications:  .  atorvastatin (LIPITOR) 40 MG tablet, Take 1 tablet (40 mg total) by mouth daily. Office visit prior to refill., Disp: 90 tablet, Rfl: 3 .  B Complex Vitamins (B-COMPLEX/B-12 PO), Take by mouth., Disp: , Rfl:  .  Blood Glucose Monitoring Suppl (ONE TOUCH ULTRA SYSTEM KIT) W/DEVICE KIT, 1 kit by Does not apply route once., Disp: 1 each, Rfl: 0 .  busPIRone (BUSPAR) 15 MG tablet, TAKE 2 TABLETS BY MOUTH IN THE MORNING AND 1 AT BEDTIME, Disp:  270 tablet, Rfl: 1 .  esomeprazole (NEXIUM) 40 MG capsule, Take 1 capsule (40 mg total) by mouth daily., Disp: 90 capsule, Rfl: 3 .  ferrous sulfate 325 (65 FE) MG tablet, Take 65 mg by mouth daily with breakfast., Disp: , Rfl:  .  fish oil-omega-3 fatty acids 1000 MG capsule, Take 2 g by mouth daily., Disp: , Rfl:  .  Ginkgo Biloba Extract (GNP GINGKO BILOBA EXTRACT) 60 MG CAPS, Take by mouth., Disp: , Rfl:  .  glipiZIDE (GLUCOTROL) 5 MG  tablet, TAKE 1 TABLET (5 MG TOTAL) BY MOUTH DAILY BEFORE BREAKFAST., Disp: 90 tablet, Rfl: 0 .  glucose blood (ONE TOUCH ULTRA TEST) test strip, 1 each by Other route daily., Disp: 100 each, Rfl: 8 .  lamoTRIgine (LAMICTAL) 200 MG tablet, Take 1 tablet (200 mg total) by mouth 2 (two) times daily., Disp: 180 tablet, Rfl: 1 .  Lancets (ONETOUCH ULTRASOFT) lancets, USE AS INSTRUCTED, Disp: 100 each, Rfl: 0 .  levothyroxine (SYNTHROID, LEVOTHROID) 150 MCG tablet, Take 1 tablet (150 mcg total) by mouth daily before breakfast., Disp: 90 tablet, Rfl: 3 .  lisinopril (PRINIVIL,ZESTRIL) 5 MG tablet, Take 1 tablet (5 mg total) by mouth daily., Disp: 90 tablet, Rfl: 3 .  XIGDUO XR 07-998 MG TB24, TAKE 2 TABLETS BY MOUTH EVERY DAY, Disp: 180 tablet, Rfl: 1 .  DULoxetine (CYMBALTA) 30 MG capsule, Take 1 daily, with the 60 mg pill for 1 week, then take his 60 mg pill daily for 1 week, then take this 30 mg per day for 1 week and then stop., Disp: 14 capsule, Rfl: 0 .  PARoxetine (PAXIL) 30 MG tablet, Beginning 12/07/18, start  W/ 1 pill qd for 1 week, then increase to 2 po qd., Disp: 60 tablet, Rfl: 1 Medication Side Effects: none  Family Medical/ Social History: Changes? No  MENTAL HEALTH EXAM:  There were no vitals taken for this visit.There is no height or weight on file to calculate BMI.  General Appearance: unable to assess  Eye Contact:  unable to assess  Speech:  Clear and Coherent  Volume:  Normal  Mood:  Euthymic  Affect:  unable to assess  Thought Process:  Goal Directed  Orientation:  Full (Time, Place, and Person)  Thought Content: Logical   Suicidal Thoughts:  No  Homicidal Thoughts:  No  Memory:  WNL  Judgement:  Good  Insight:  Good  Psychomotor Activity:  unable to assess  Concentration:  Concentration: Good  Recall:  Good  Fund of Knowledge: Good  Language: Good  Assets:  Desire for Improvement  ADL's:  Intact  Cognition: WNL  Prognosis:  Good    DIAGNOSES:    ICD-10-CM    1. Major depressive disorder, recurrent episode, moderate (HCC)  F33.1   2. Generalized anxiety disorder  F41.1   3. History of ETOH abuse  F10.11     Receiving Psychotherapy: No    RECOMMENDATIONS:  We discussed different options and I think it best to go back to the Paxil which is what he has taken successfully in the past. Wean off Wellbutrin XL 150 mg, one half p.o. for 7 days and then stop. Continue BuSpar 15 mg 2 p.o. every morning and 1 p.o. nightly. Wean off of Cymbalta by taking a 60 mg +30 mg daily for 1 week, then 60 mg daily for 1 week, then 30 mg daily for 1 week and then stop. Start Paxil 30 mg daily on 12/07/2018, for 1 week, and  then 2 p.o. thereafter. Continue Lamictal 200 mg 1 p.o. twice daily. I emailed the instructions for the Wellbutrin, Cymbalta, and Paxil to his wife, at his request. Return in 6 weeks.  Donnal Moat, PA-C   This record has been created using [employer].  Chart creation errors have been sought, but may not always have been located and corrected. Such creation errors do not reflect on the standard of medical care.

## 2018-12-23 ENCOUNTER — Other Ambulatory Visit: Payer: Self-pay | Admitting: Physician Assistant

## 2019-01-05 ENCOUNTER — Other Ambulatory Visit: Payer: Self-pay | Admitting: Emergency Medicine

## 2019-01-05 DIAGNOSIS — E1165 Type 2 diabetes mellitus with hyperglycemia: Secondary | ICD-10-CM

## 2019-01-05 NOTE — Telephone Encounter (Signed)
Requested medication (s) are due for refill today: yes  Requested medication (s) are on the active medication list: yes  Last refill:  10/13/2018  Future visit scheduled: no  Notes to clinic: review for refill   Requested Prescriptions  Pending Prescriptions Disp Refills   glipiZIDE (GLUCOTROL) 5 MG tablet [Pharmacy Med Name: GLIPIZIDE 5 MG TABLET] 90 tablet 0    Sig: TAKE 1 TABLET (5 MG TOTAL) BY MOUTH DAILY BEFORE BREAKFAST.     Endocrinology:  Diabetes - Sulfonylureas Failed - 01/05/2019  1:13 AM      Failed - HBA1C is between 0 and 7.9 and within 180 days    Hemoglobin A1C  Date Value Ref Range Status  04/17/2018 7.8 (A) 4.0 - 5.6 % Final   Hgb A1c MFr Bld  Date Value Ref Range Status  12/29/2015 7.8 (H) <5.7 % Final    Comment:      For someone without known diabetes, a hemoglobin A1c value of 6.5% or greater indicates that they may have diabetes and this should be confirmed with a follow-up test.   For someone with known diabetes, a value <7% indicates that their diabetes is well controlled and a value greater than or equal to 7% indicates suboptimal control. A1c targets should be individualized based on duration of diabetes, age, comorbid conditions, and other considerations.   Currently, no consensus exists for use of hemoglobin A1c for diagnosis of diabetes for children.            Failed - Valid encounter within last 6 months    Recent Outpatient Visits          8 months ago Type 2 diabetes mellitus with hyperglycemia, without long-term current use of insulin [facility])   [facility] at [facility], Ranell Patrick, MD   10 months ago Type 2 diabetes mellitus with hyperglycemia, without long-term current use of insulin [facility])   Primary Care at [facility], Ines Bloomer, MD   11 months ago Encounter for commercial driver medical examination (CDME)   [facility] at [facility], Ranell Patrick, MD   1 year ago Cough   [facility] at [facility], Ranell Patrick, MD   1 year ago Essential hypertension   Primary Care at [facility], [place], [place]

## 2019-01-06 ENCOUNTER — Encounter: Payer: Self-pay | Admitting: *Deleted

## 2019-01-06 ENCOUNTER — Telehealth: Payer: Self-pay | Admitting: *Deleted

## 2019-01-06 NOTE — Telephone Encounter (Signed)
Please schedule Robert Wise an appointment. 30 days of his medication was sent in.

## 2019-01-07 ENCOUNTER — Encounter: Payer: Self-pay | Admitting: Family Medicine

## 2019-01-07 ENCOUNTER — Other Ambulatory Visit: Payer: Self-pay

## 2019-01-07 DIAGNOSIS — E1165 Type 2 diabetes mellitus with hyperglycemia: Secondary | ICD-10-CM

## 2019-01-07 MED ORDER — BLOOD GLUCOSE MONITOR KIT: PACK | 0 refills | Status: DC

## 2019-01-11 ENCOUNTER — Ambulatory Visit: Payer: BC Managed Care – PPO | Admitting: Physician Assistant

## 2019-01-28 ENCOUNTER — Other Ambulatory Visit: Payer: Self-pay | Admitting: Family Medicine

## 2019-01-28 ENCOUNTER — Other Ambulatory Visit: Payer: Self-pay | Admitting: Emergency Medicine

## 2019-01-28 DIAGNOSIS — IMO0002 Reserved for concepts with insufficient information to code with codable children: Secondary | ICD-10-CM

## 2019-01-28 DIAGNOSIS — E1165 Type 2 diabetes mellitus with hyperglycemia: Secondary | ICD-10-CM

## 2019-01-28 NOTE — Telephone Encounter (Signed)
Forwarding medication refill request to the clinical pool for review. 

## 2019-01-28 NOTE — Telephone Encounter (Signed)
Requested medication (s) are due for refill today: yes  Requested medication (s) are on the active medication list: yes  Last refill: 01/06/2019  Future visit scheduled:yes  Notes to clinic:  Review for refill  overdue for office visit     Requested Prescriptions  Pending Prescriptions Disp Refills   glipiZIDE (GLUCOTROL) 5 MG tablet [Pharmacy Med Name: GLIPIZIDE 5 MG TABLET] 30 tablet 0    Sig: TAKE 1 TABLET (5 MG TOTAL) BY MOUTH DAILY BEFORE BREAKFAST.     Endocrinology:  Diabetes - Sulfonylureas Failed - 01/28/2019  2:33 PM      Failed - HBA1C is between 0 and 7.9 and within 180 days    Hemoglobin A1C  Date Value Ref Range Status  04/17/2018 7.8 (A) 4.0 - 5.6 % Final   Hgb A1c MFr Bld  Date Value Ref Range Status  12/29/2015 7.8 (H) <5.7 % Final    Comment:      For someone without known diabetes, a hemoglobin A1c value of 6.5% or greater indicates that they may have diabetes and this should be confirmed with a follow-up test.   For someone with known diabetes, a value <7% indicates that their diabetes is well controlled and a value greater than or equal to 7% indicates suboptimal control. A1c targets should be individualized based on duration of diabetes, age, comorbid conditions, and other considerations.   Currently, no consensus exists for use of hemoglobin A1c for diagnosis of diabetes for children.            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

## 2019-01-29 ENCOUNTER — Other Ambulatory Visit: Payer: Self-pay | Admitting: Emergency Medicine

## 2019-01-29 DIAGNOSIS — E1165 Type 2 diabetes mellitus with hyperglycemia: Secondary | ICD-10-CM

## 2019-01-29 NOTE — Telephone Encounter (Signed)
Forwarding medication refill request to the clinical pool for review. 

## 2019-02-01 ENCOUNTER — Other Ambulatory Visit: Payer: Self-pay

## 2019-02-01 ENCOUNTER — Encounter: Payer: Self-pay | Admitting: Family Medicine

## 2019-02-01 ENCOUNTER — Ambulatory Visit (INDEPENDENT_AMBULATORY_CARE_PROVIDER_SITE_OTHER): Payer: Self-pay | Admitting: Family Medicine

## 2019-02-01 VITALS — BP 136/74 | HR 70 | Temp 98.2°F | Ht 72.0 in | Wt 240.2 lb

## 2019-02-01 DIAGNOSIS — E1165 Type 2 diabetes mellitus with hyperglycemia: Secondary | ICD-10-CM

## 2019-02-01 DIAGNOSIS — Z024 Encounter for examination for driving license: Secondary | ICD-10-CM

## 2019-02-01 LAB — GLUCOSE, POCT (MANUAL RESULT ENTRY): POC Glucose: 108 mg/dl — AB (ref 70–99)

## 2019-02-01 NOTE — Progress Notes (Addendum)
Subjective:    Patient ID: Robert Wise, male    DOB: 01-06-1961, 58 y.o.   MRN: [id number]  HPI Robert Wise is a 58 y.o. male Presents today for: Chief Complaint  Patient presents with  . dot physical    DOT physical only   Presents for DOT physical.  History of depression, chronic pain, OSA on CPAP, diabetes. No chronic pain meds at this time. Doing ok off pain meds - back is doing better.    Depression Followed by behavioral health with office visit August 31. Feels like doing better on Paxil. Feels like stable.  Depression screen [facility] 2/9 04/17/2018 02/10/2018 02/06/2018 06/02/2017 04/11/2017  Decreased Interest 0 0 0 3 0  Down, Depressed, Hopeless 0 0 0 3 0  PHQ - 2 Score 0 0 0 6 0  Altered sleeping - - - 3 -  Tired, decreased energy - - - 3 -  Change in appetite - - - 2 -  Feeling bad or failure about yourself  - - - 2 -  Trouble concentrating - - - 2 -  Moving slowly or fidgety/restless - - - 2 -  Suicidal thoughts - - - 2 -  PHQ-9 Score - - - 22 -  Difficult doing work/chores - - - Somewhat difficult -    Diabetes:  Oral medications only, no insulin.  Improved control from November to January. No side effects. Improved with keto diet.  Last ov and A1c in January for DM. Home readings 120-150.  Lowest 80-90, some fatigue when lower - about once per month. No true lows.   Lab Results  Component Value Date   HGBA1C 7.8 (A) 04/17/2018   HGBA1C 14.0 (A) 02/10/2018   HGBA1C 8.4 04/11/2017   OSA on CPAP Evaluated by neurology May 20.  At that visit he was compliant with machine use at 27 out of 30 days, indicated 90% compliance and machine greater than 4 hours for 70% of the time.  Residual AHI 3.2 on 14 cm water.   BP Readings from Last 3 Encounters:  02/01/19 (!) 150/79  04/17/18 130/78  02/10/18 119/66  on lisinopril with diabetes. No known hx of HTN.   On statin for HLD, synthroid for hypothyroidism.  No CP with exertion.   Lab Results  Component  Value Date   TSH 0.901 04/11/2017   Lab Results  Component Value Date   CHOL 197 02/10/2018   HDL 28 (L) 02/10/2018   LDLCALC Comment 02/10/2018   TRIG 449 (H) 02/10/2018   CHOLHDL 7.0 (H) 02/10/2018   Wears glasses to drive. No difficulty with glare, no history of glaucoma/aphakia.  No msk weakness/deficits.     Patient Active Problem List   Diagnosis Date Noted  . Anxiety state 01/25/2018  . Fatigue 01/25/2018  . Uncontrolled type 2 diabetes mellitus with complication, without long-term current use of insulin ([place]) 02/10/2015  . Chronic pain 12/01/2014  . Depression 10/12/2014   Past Medical History:  Diagnosis Date  . Anxiety   . Depression   . Diabetes mellitus without complication ([place])   . Hyperlipidemia   . OSA (obstructive sleep apnea) 12/15/2012   Past Surgical History:  Procedure Laterality Date  . ANKLE SURGERY Left 2006  . VASECTOMY     Allergies  Allergen Reactions  . Tricor [Fenofibrate]   . Zocor [Simvastatin]    Prior to Admission medications   Medication Sig Start Date End Date Taking? Authorizing Provider  atorvastatin (LIPITOR) 40  MG tablet Take 1 tablet (40 mg total) by mouth daily. Office visit prior to refill. 02/10/18   Horald Pollen, MD  B Complex Vitamins (B-COMPLEX/B-12 PO) Take by mouth.    [provider]  blood glucose meter kit and supplies KIT Dispense based on patient and insurance preference. Use up to four times daily as directed. (FOR ICD-9 250.00, 250.01). 01/07/19   Forrest Moron, MD  Blood Glucose Monitoring Suppl (ONE TOUCH ULTRA SYSTEM KIT) W/DEVICE KIT 1 kit by Does not apply route once. 10/25/11   Weber, Damaris Hippo, PA-C  busPIRone (BUSPAR) 15 MG tablet TAKE 2 TABLETS BY MOUTH IN THE MORNING AND 1 AT BEDTIME 10/12/18   Hurst, Helene Kelp T, PA-C  DULoxetine (CYMBALTA) 30 MG capsule Take 1 daily, with the 60 mg pill for 1 week, then take his 60 mg pill daily for 1 week, then take this 30 mg per day for 1 week and then  stop. 11/30/18   Addison Lank, PA-C  esomeprazole (NEXIUM) 40 MG capsule Take 1 capsule (40 mg total) by mouth daily. 04/11/17   Jaynee Eagles, PA-C  ferrous sulfate 325 (65 FE) MG tablet Take 65 mg by mouth daily with breakfast.    [provider]  fish oil-omega-3 fatty acids 1000 MG capsule Take 2 g by mouth daily.    [provider]  Ginkgo Biloba Extract (GNP GINGKO BILOBA EXTRACT) 60 MG CAPS Take by mouth.    [provider]  glipiZIDE (GLUCOTROL) 5 MG tablet TAKE 1 TABLET (5 MG TOTAL) BY MOUTH DAILY BEFORE BREAKFAST. 01/06/19   [name], MD  glucose blood (ONE TOUCH ULTRA TEST) test strip 1 each by Other route daily. 12/01/14   Jaynee Eagles, PA-C  lamoTRIgine (LAMICTAL) 200 MG tablet Take 1 tablet (200 mg total) by mouth 2 (two) times daily. 10/12/18   Donnal Moat T, PA-C  Lancets (ONETOUCH ULTRASOFT) lancets USE AS INSTRUCTED 11/28/13   [name], MD  levothyroxine (SYNTHROID, LEVOTHROID) 150 MCG tablet Take 1 tablet (150 mcg total) by mouth daily before breakfast. 02/10/18   Horald Pollen, MD  lisinopril (PRINIVIL,ZESTRIL) 5 MG tablet Take 1 tablet (5 mg total) by mouth daily. 02/10/18   Horald Pollen, MD  PARoxetine (PAXIL) 30 MG tablet BEGINNING 12/07/18, START TAKING 1 TABLET DAILY FOR 1 WEEK, THEN INCREASE TO 2 TABLETS DAILY 12/23/18   Adelene Idler, Helene Kelp T, PA-C  XIGDUO XR 07-998 MG TB24 TAKE 2 TABLETS BY MOUTH EVERY DAY 08/09/18   Horald Pollen, MD   Social History   Socioeconomic History  . Marital status: Married    Spouse name: [name]  . Number of children: 3  . Years of education: 11.5  . Highest education level: Not on file  Occupational History    Employer: [employer]    Comment: Driver  Social Needs  . Financial resource strain: Not on file  . Food insecurity    Worry: Not on file    Inability: Not on file  . Transportation needs    Medical: Not on file    Non-medical: Not on file  Tobacco Use  . Smoking  status: Former Smoker    Packs/day: 1.50    Years: 37.00    Pack years: 55.50    Types: Cigarettes    Quit date: 05/07/2017    Years since quitting: 1.7  . Smokeless tobacco: Never Used  Substance and Sexual Activity  . Alcohol use: No    Comment: quit:  08/14/2011  . Drug use: No  . Sexual activity: Yes    Birth control/protection: None  Lifestyle  . Physical activity    Days per week: Not on file    Minutes per session: Not on file  . Stress: Not on file  Relationships  . Social Herbalist on phone: Not on file    Gets together: Not on file    Attends religious service: Not on file    Active member of club or organization: Not on file    Attends meetings of clubs or organizations: Not on file    Relationship status: Not on file  . Intimate partner violence    Fear of current or ex partner: Not on file    Emotionally abused: Not on file    Physically abused: Not on file    Forced sexual activity: Not on file  Other Topics Concern  . Not on file  Social History Narrative   Patient lives at home with family.    Caffeine Use: 1 pot of coffee daily    Review of Systems  Dot form reviewed.     Objective:   Physical Exam Vitals signs reviewed.  Constitutional:      Appearance: He is well-developed.  HENT:     Head: Normocephalic and atraumatic.     Right Ear: External ear normal.     Left Ear: External ear normal.  Eyes:     Conjunctiva/sclera: Conjunctivae normal.     Pupils: Pupils are equal, round, and reactive to light.  Neck:     Musculoskeletal: Normal range of motion and neck supple.     Thyroid: No thyromegaly.  Cardiovascular:     Rate and Rhythm: Normal rate and regular rhythm.     Heart sounds: Normal heart sounds.  Pulmonary:     Effort: Pulmonary effort is normal. No respiratory distress.     Breath sounds: Normal breath sounds. No wheezing.  Abdominal:     General: There is no distension.     Palpations: Abdomen is soft.     Tenderness:  There is no abdominal tenderness.     Hernia: There is no hernia in the left inguinal area or right inguinal area.  Musculoskeletal: Normal range of motion.        General: No tenderness.  Lymphadenopathy:     Cervical: No cervical adenopathy.  Skin:    General: Skin is warm and dry.  Neurological:     Mental Status: He is alert and oriented to person, place, and time.     Deep Tendon Reflexes: Reflexes are normal and symmetric.  Psychiatric:        Behavior: Behavior normal.    Vitals:   02/01/19 1341  BP: (!) 150/79  Pulse: 70  Temp: 98.2 F (36.8 C)  TempSrc: Oral  SpO2: 97%  Weight: 240 lb 3.2 oz (109 kg)  Height: 6' (1.829 m)   In office cbg testing 108.     Assessment & Plan:  Robert Wise is a 58 y.o. male Encounter for commercial driver medical examination (CDME)  Type 2 diabetes mellitus with hyperglycemia, without long-term current use of insulin ([place]) - Plan: POCT glucose (manual entry)  DOT physical, form reviewed.  See copy of DOT paperwork.  Needs updated compliance report for CPAP machine, needs updated testing and evaluation for diabetes as well as elevated blood pressure in office today.  Advised to schedule follow-up with primary care provider.  Also asked  for letter clearing him to drive from his mental health provider but symptoms are improved.  39-monthcard given for now.   Addendum 02/04/19: CPAP compliance report reviewed by Mychart email.  9/5-11/3/20: 85% days used with 80% usage over 4 hrs.    No orders of the defined types were placed in this encounter.  Patient Instructions    I will need a recent complinace report for your cpap machine.  Updated diabetes testing needed - please schedule appointment with primary provider in next few weeks.  Please have your mental health provider send me a letter regarding control of depression and stability to drive.  Blood pressure elevated. Please have checked by primary provider.    3 month card  given for now, but with above information may be able to extend to 1 year.    If you have lab work done today you will be contacted with your lab results within the next 2 weeks.  If you have not heard from [place] please contact [place] The fastest way to get your results is to register for My Chart.   IF you received an x-ray today, you will receive an invoice from [facility]. Please contact [facility] at [phone] questions or concerns regarding your invoice.   IF you received labwork today, you will receive an invoice from [place] Please contact [facility] at [phone] questions or concerns regarding your invoice.   Our billing staff will not be able to assist you with questions regarding bills from these companies.  You will be contacted with the lab results as soon as they are available. The fastest way to get your results is to activate your My Chart account. Instructions are located on the last page of this paperwork. If you have not heard from [place] the results in 2 weeks, please contact this office.       Signed,   JMerri Ray MD [facility]   02/01/19 2:25 PM

## 2019-02-01 NOTE — Patient Instructions (Addendum)
  I will need a recent complinace report for your cpap machine.  Updated diabetes testing needed - please schedule appointment with primary provider in next few weeks.  Please have your mental health provider send me a letter regarding control of depression and stability to drive.  Blood pressure elevated. Please have checked by primary provider.    3 month card given for now, but with above information may be able to extend to 1 year.    If you have lab work done today you will be contacted with your lab results within the next 2 weeks.  If you have not heard from [place] then please contact us. The fastest way to get your results is to register for My Chart.   IF you received an x-ray today, you will receive an invoice from [facility]. Please contact [facility] at [phone] with questions or concerns regarding your invoice.   IF you received labwork today, you will receive an invoice from [place]. Please contact [facility] at [phone] with questions or concerns regarding your invoice.   Our billing staff will not be able to assist you with questions regarding bills from these companies.  You will be contacted with the lab results as soon as they are available. The fastest way to get your results is to activate your My Chart account. Instructions are located on the last page of this paperwork. If you have not heard from [place] regarding the results in 2 weeks, please contact this office.

## 2019-02-02 ENCOUNTER — Telehealth: Payer: Self-pay | Admitting: Physician Assistant

## 2019-02-02 ENCOUNTER — Encounter: Payer: Self-pay | Admitting: Adult Health

## 2019-02-02 NOTE — Telephone Encounter (Signed)
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 He recently had a DOT physical. He need the letter by Nov 19th at the latest. If you could have this ready by Monday 11/9 he will change his scheduled telehealth appt to an office visit so he can pick up the letter.

## 2019-02-03 NOTE — Telephone Encounter (Signed)
Letter was dictated. 

## 2019-02-04 ENCOUNTER — Encounter: Payer: Self-pay | Admitting: Family Medicine

## 2019-02-05 NOTE — Telephone Encounter (Signed)
Letter signed on 02/04/2019. Pt to p/u

## 2019-02-08 ENCOUNTER — Ambulatory Visit (INDEPENDENT_AMBULATORY_CARE_PROVIDER_SITE_OTHER): Payer: BC Managed Care – PPO | Admitting: Physician Assistant

## 2019-02-08 ENCOUNTER — Other Ambulatory Visit: Payer: Self-pay

## 2019-02-08 ENCOUNTER — Encounter: Payer: Self-pay | Admitting: Physician Assistant

## 2019-02-08 DIAGNOSIS — F331 Major depressive disorder, recurrent, moderate: Secondary | ICD-10-CM

## 2019-02-08 DIAGNOSIS — F411 Generalized anxiety disorder: Secondary | ICD-10-CM | POA: Diagnosis not present

## 2019-02-08 MED ORDER — MODAFINIL 200 MG PO TABS: 100.0000 mg | ORAL_TABLET | Freq: Every day | ORAL | 0 refills | Status: DC

## 2019-02-08 NOTE — Progress Notes (Signed)
[facility]  Patient ID: Robert Wise,  MRN: [id number]  PCP: Shawnee Knapp, MD  Date of Evaluation: 02/08/2019 Time spent:15 minutes  Chief Complaint:  Chief Complaint    Depression; Anxiety; Follow-up      HISTORY/CURRENT STATUS: HPI For routine med check.  Since going back on the Paxil, "I feel good. The only problem is I have trouble getting out of bed. I hit the snooze button over and over.  After I get up, I'm fine after an hour or so."  States he usually gets up around 4:00 to use the restroom but then goes back to sleep.  He prefers to be up around 4 or 5 at the latest so he can get to work early and then go home early.  However, after he goes back to sleep, he has trouble waking up.  He hits the snooze button over and over again and is just "dragging" for an hour or so.  He stops and gets an iced coffee and after that kicks in he is able to work without any problems.  The Paxil has really helped him with the depression.  He is able to enjoy things and has a little more energy and motivation now.  He denies suicidal or homicidal thoughts.  Anxiety is controlled.  Work is going well.  Sleeps good, "too good."  Denies dizziness, syncope, seizures, numbness, tingling, tremor, tics, unsteady gait, slurred speech, confusion. Denies muscle or joint pain, stiffness, or dystonia.  Individual Medical History/ Review of Systems: Changes? :No    Past medications for mental health diagnoses include: Lamictal, BuSpar, Cymbalta, Paxil, Abilify, Rexulti, Nuvigil was not effective.  Allergies: Tricor [fenofibrate] and Zocor [simvastatin]  Current Medications:  Current Outpatient Medications:  .  atorvastatin (LIPITOR) 40 MG tablet, Take 1 tablet (40 mg total) by mouth daily. Office visit prior to refill., Disp: 90 tablet, Rfl: 3 .  B Complex Vitamins (B-COMPLEX/B-12 PO), Take by mouth., Disp: , Rfl:  .  blood glucose meter kit and supplies KIT, Dispense based on patient  and insurance preference. Use up to four times daily as directed. (FOR ICD-9 250.00, 250.01)., Disp: 1 each, Rfl: 0 .  Blood Glucose Monitoring Suppl (ONE TOUCH ULTRA SYSTEM KIT) W/DEVICE KIT, 1 kit by Does not apply route once., Disp: 1 each, Rfl: 0 .  busPIRone (BUSPAR) 15 MG tablet, TAKE 2 TABLETS BY MOUTH IN THE MORNING AND 1 AT BEDTIME, Disp: 270 tablet, Rfl: 1 .  cholecalciferol (VITAMIN D3) 25 MCG (1000 UT) tablet, Take 2,000 Units by mouth daily., Disp: , Rfl:  .  esomeprazole (NEXIUM) 40 MG capsule, Take 1 capsule (40 mg total) by mouth daily., Disp: 90 capsule, Rfl: 3 .  ferrous sulfate 325 (65 FE) MG tablet, Take 65 mg by mouth daily with breakfast., Disp: , Rfl:  .  fish oil-omega-3 fatty acids 1000 MG capsule, Take 2 g by mouth daily., Disp: , Rfl:  .  Ginkgo Biloba Extract (GNP GINGKO BILOBA EXTRACT) 60 MG CAPS, Take by mouth., Disp: , Rfl:  .  glipiZIDE (GLUCOTROL) 5 MG tablet, TAKE 1 TABLET (5 MG TOTAL) BY MOUTH DAILY BEFORE BREAKFAST., Disp: 30 tablet, Rfl: 0 .  glucose blood (ONE TOUCH ULTRA TEST) test strip, 1 each by Other route daily., Disp: 100 each, Rfl: 8 .  lamoTRIgine (LAMICTAL) 200 MG tablet, Take 1 tablet (200 mg total) by mouth 2 (two) times daily., Disp: 180 tablet, Rfl: 1 .  Lancets (ONETOUCH ULTRASOFT) lancets, USE AS  INSTRUCTED, Disp: 100 each, Rfl: 0 .  levothyroxine (SYNTHROID, LEVOTHROID) 150 MCG tablet, Take 1 tablet (150 mcg total) by mouth daily before breakfast., Disp: 90 tablet, Rfl: 3 .  lisinopril (PRINIVIL,ZESTRIL) 5 MG tablet, Take 1 tablet (5 mg total) by mouth daily., Disp: 90 tablet, Rfl: 3 .  PARoxetine (PAXIL) 30 MG tablet, BEGINNING 12/07/18, START TAKING 1 TABLET DAILY FOR 1 WEEK, THEN INCREASE TO 2 TABLETS DAILY, Disp: 180 tablet, Rfl: 0 .  XIGDUO XR 07-998 MG TB24, TAKE 2 TABLETS BY MOUTH EVERY DAY, Disp: 60 tablet, Rfl: 0 .  modafinil (PROVIGIL) 200 MG tablet, Take 0.5 tablets (100 mg total) by mouth daily., Disp: 30 tablet, Rfl: 0 Medication Side  Effects: none  Family Medical/ Social History: Changes? No  MENTAL HEALTH EXAM:  There were no vitals taken for this visit.There is no height or weight on file to calculate BMI.  General Appearance: Casual, Neat and Well Groomed  Eye Contact:  Good  Speech:  Clear and Coherent  Volume:  Normal  Mood:  Euthymic  Affect:  Appropriate  Thought Process:  Goal Directed and Descriptions of Associations: Intact  Orientation:  Full (Time, Place, and Person)  Thought Content: Logical   Suicidal Thoughts:  No  Homicidal Thoughts:  No  Memory:  WNL  Judgement:  Good  Insight:  Good  Psychomotor Activity:  Normal  Concentration:  Concentration: Good  Recall:  Good  Fund of Knowledge: Good  Language: Good  Assets:  Desire for Improvement  ADL's:  Intact  Cognition: WNL  Prognosis:  Good    DIAGNOSES:    ICD-10-CM   1. Major depressive disorder, recurrent episode, moderate (HCC)  F33.1   2. Generalized anxiety disorder  F41.1     Receiving Psychotherapy: No    RECOMMENDATIONS:  Start modafinil 200 mg, one half p.o. every morning.  We discussed benefits, risks, side effects and he accepts.  He will take it when he gets up to use the restroom and hopefully will not go back to sleep.  He is aware that insurance will not pay for this and I gave him the good Rx information. Continue BuSpar 15 mg, 2 p.o. every morning and 1 nightly. Continue Paxil 60 mg. Continue Lamictal 200 mg 1 p.o. twice daily. Continue multivitamin, fish oil, ginkgo biloba, vitamin D, and B complex. Return in 6 to 8 weeks.  Donnal Moat, PA-C

## 2019-02-15 ENCOUNTER — Ambulatory Visit: Payer: BC Managed Care – PPO | Admitting: Family Medicine

## 2019-02-16 ENCOUNTER — Encounter: Payer: Self-pay | Admitting: Family Medicine

## 2019-02-18 ENCOUNTER — Other Ambulatory Visit: Payer: Self-pay

## 2019-02-18 ENCOUNTER — Encounter: Payer: Self-pay | Admitting: Family Medicine

## 2019-02-18 ENCOUNTER — Ambulatory Visit (INDEPENDENT_AMBULATORY_CARE_PROVIDER_SITE_OTHER): Payer: BC Managed Care – PPO | Admitting: Family Medicine

## 2019-02-18 VITALS — BP 128/68 | HR 63 | Temp 98.7°F | Wt 244.2 lb

## 2019-02-18 DIAGNOSIS — Z23 Encounter for immunization: Secondary | ICD-10-CM | POA: Diagnosis not present

## 2019-02-18 DIAGNOSIS — E1165 Type 2 diabetes mellitus with hyperglycemia: Secondary | ICD-10-CM | POA: Diagnosis not present

## 2019-02-18 DIAGNOSIS — E782 Mixed hyperlipidemia: Secondary | ICD-10-CM

## 2019-02-18 DIAGNOSIS — E039 Hypothyroidism, unspecified: Secondary | ICD-10-CM

## 2019-02-18 DIAGNOSIS — G4733 Obstructive sleep apnea (adult) (pediatric): Secondary | ICD-10-CM | POA: Diagnosis not present

## 2019-02-18 DIAGNOSIS — Z9989 Dependence on other enabling machines and devices: Secondary | ICD-10-CM

## 2019-02-18 MED ORDER — GLIPIZIDE 5 MG PO TABS: 5.0000 mg | ORAL_TABLET | Freq: Every day | ORAL | 1 refills | Status: DC

## 2019-02-18 MED ORDER — LEVOTHYROXINE SODIUM 150 MCG PO TABS: 150.0000 ug | ORAL_TABLET | Freq: Every day | ORAL | 1 refills | Status: DC

## 2019-02-18 MED ORDER — ATORVASTATIN CALCIUM 40 MG PO TABS: 40.0000 mg | ORAL_TABLET | Freq: Every day | ORAL | 1 refills | Status: DC

## 2019-02-18 MED ORDER — XIGDUO XR 5-1000 MG PO TB24: 2.0000 | ORAL_TABLET | Freq: Every day | ORAL | 1 refills | Status: DC

## 2019-02-18 MED ORDER — LISINOPRIL 5 MG PO TABS: 5.0000 mg | ORAL_TABLET | Freq: Every day | ORAL | 1 refills | Status: DC

## 2019-02-18 NOTE — Progress Notes (Signed)
Subjective:  Patient ID: Robert Wise, male    DOB: Nov 10, 1960  Age: 58 y.o. MRN: [id number]  CC: No chief complaint on file.   HPI [name] presents for   Diabetes: Associated with hyperglycemia.  A1c in January had improved from November 2019. He was continued on glipizide 5 mg daily and Xigduo total of 12/1998 mg daily. He is on statin and ACE inhibitor. Microalbumin: Normal ratio January 2019 Optho, foot exam, pneumovax: Up-to-date Home readings: 140-170. No symptomatic lows, no 200's. No new side effects with meds.   Lab Results  Component Value Date   HGBA1C 7.8 (A) 04/17/2018   HGBA1C 14.0 (A) 02/10/2018   HGBA1C 8.4 04/11/2017   Lab Results  Component Value Date   MICROALBUR 2.1 (H) 08/01/2014   [place] Comment 02/10/2018   CREATININE 0.89 02/10/2018   Hyperlipidemia: Lipitor 40 mg daily.  No new myalgias/SE's.  Lab Results  Component Value Date   CHOL 197 02/10/2018   HDL 28 (L) 02/10/2018   LDLCALC Comment 02/10/2018   TRIG 449 (H) 02/10/2018   CHOLHDL 7.0 (H) 02/10/2018   Lab Results  Component Value Date   ALT 23 02/10/2018   AST 14 02/10/2018   ALKPHOS 81 02/10/2018   BILITOT 0.3 02/10/2018   Hypertension: Lisinopril 5 mg daily Home readings: BP Readings from Last 3 Encounters:  02/18/19 128/68  02/01/19 136/74  04/17/18 130/78   Lab Results  Component Value Date   CREATININE 0.89 02/10/2018    Hypothyroidism: Lab Results  Component Value Date   TSH 0.901 04/11/2017  Synthroid 150 mcg daily. Taking medication daily.  No new hot or cold intolerance. No new hair or skin changes, heart palpitations or new fatigue.  weight changes - few pound weight gain.   OSA on CPAP: Compliance of 85% usage with 80% greater than 4 hours on September 5 through November 3 report. Feels well rested.  New report 10/5-11/3: 83% usage, 77% over 4 hrs.   Depression/anxiety Followed by behavioral health. Not taking modafinil - used for weekends  when sleeping - no change, so stopped. No history of narcolepsy.  Letter from psychiatry for DOT purposes: cleared to drive without restrictions or concerns with current meds.   History Patient Active Problem List   Diagnosis Date Noted  . Anxiety state 01/25/2018  . Fatigue 01/25/2018  . Uncontrolled type 2 diabetes mellitus with complication, without long-term current use of insulin (Robert Wise) 02/10/2015  . Chronic pain 12/01/2014  . Depression 10/12/2014   Past Medical History:  Diagnosis Date  . Anxiety   . Depression   . Diabetes mellitus without complication (Robert Wise)   . Hyperlipidemia   . OSA (obstructive sleep apnea) 12/15/2012   Past Surgical History:  Procedure Laterality Date  . ANKLE SURGERY Left 2006  . VASECTOMY     Allergies  Allergen Reactions  . Tricor [Fenofibrate]   . Zocor [Simvastatin]    Prior to Admission medications   Medication Sig Start Date End Date Taking? Authorizing Provider  atorvastatin (LIPITOR) 40 MG tablet Take 1 tablet (40 mg total) by mouth daily. Office visit prior to refill. 02/10/18   Horald Pollen, MD  B Complex Vitamins (B-COMPLEX/B-12 PO) Take by mouth.    [provider]  blood glucose meter kit and supplies KIT Dispense based on patient and insurance preference. Use up to four times daily as directed. (FOR ICD-9 250.00, 250.01). 01/07/19   Forrest Moron, MD  Blood Glucose Monitoring Suppl (ONE  TOUCH ULTRA SYSTEM KIT) W/DEVICE KIT 1 kit by Does not apply route once. 10/25/11   Weber, Damaris Hippo, PA-C  busPIRone (BUSPAR) 15 MG tablet TAKE 2 TABLETS BY MOUTH IN THE MORNING AND 1 AT BEDTIME 10/12/18   Hurst, Teresa T, PA-C  cholecalciferol (VITAMIN D3) 25 MCG (1000 UT) tablet Take 2,000 Units by mouth daily.    [provider]  esomeprazole (NEXIUM) 40 MG capsule Take 1 capsule (40 mg total) by mouth daily. 04/11/17   Jaynee Eagles, PA-C  ferrous sulfate 325 (65 FE) MG tablet Take 65 mg by mouth daily with breakfast.     [provider]  fish oil-omega-3 fatty acids 1000 MG capsule Take 2 g by mouth daily.    [provider]  Ginkgo Biloba Extract (GNP GINGKO BILOBA EXTRACT) 60 MG CAPS Take by mouth.    [provider]  glipiZIDE (GLUCOTROL) 5 MG tablet TAKE 1 TABLET (5 MG TOTAL) BY MOUTH DAILY BEFORE BREAKFAST. 02/03/19   [name], MD  glucose blood (ONE TOUCH ULTRA TEST) test strip 1 each by Other route daily. 12/01/14   Jaynee Eagles, PA-C  lamoTRIgine (LAMICTAL) 200 MG tablet Take 1 tablet (200 mg total) by mouth 2 (two) times daily. 10/12/18   Donnal Moat T, PA-C  Lancets (ONETOUCH ULTRASOFT) lancets USE AS INSTRUCTED 11/28/13   [name], MD  levothyroxine (SYNTHROID, LEVOTHROID) 150 MCG tablet Take 1 tablet (150 mcg total) by mouth daily before breakfast. 02/10/18   Horald Pollen, MD  lisinopril (PRINIVIL,ZESTRIL) 5 MG tablet Take 1 tablet (5 mg total) by mouth daily. 02/10/18   Horald Pollen, MD  modafinil (PROVIGIL) 200 MG tablet Take 0.5 tablets (100 mg total) by mouth daily. 02/08/19   Donnal Moat T, PA-C  PARoxetine (PAXIL) 30 MG tablet BEGINNING 12/07/18, START TAKING 1 TABLET DAILY FOR 1 WEEK, THEN INCREASE TO 2 TABLETS DAILY 12/23/18   Donnal Moat T, PA-C  XIGDUO XR 07-998 MG TB24 TAKE 2 TABLETS BY MOUTH EVERY DAY 02/03/19   [name], MD   Social History   Socioeconomic History  . Marital status: Married    Spouse name: [name]  . Number of children: 3  . Years of education: 11.5  . Highest education level: Not on file  Occupational History    Employer: [employer]    Comment: Driver  Social Needs  . Financial resource strain: Not on file  . Food insecurity    Worry: Not on file    Inability: Not on file  . Transportation needs    Medical: Not on file    Non-medical: Not on file  Tobacco Use  . Smoking status: Former Smoker    Packs/day: 1.50    Years: 37.00    Pack years: 55.50    Types: Cigarettes    Quit date:  05/07/2017    Years since quitting: 1.7  . Smokeless tobacco: Never Used  Substance and Sexual Activity  . Alcohol use: No    Comment: quit: 08/14/2011  . Drug use: No  . Sexual activity: Yes    Birth control/protection: None  Lifestyle  . Physical activity    Days per week: Not on file    Minutes per session: Not on file  . Stress: Not on file  Relationships  . Social Herbalist on phone: Not on file    Gets together: Not on file    Attends religious service: Not on file  Active member of club or organization: Not on file    Attends meetings of clubs or organizations: Not on file    Relationship status: Not on file  . Intimate partner violence    Fear of current or ex partner: Not on file    Emotionally abused: Not on file    Physically abused: Not on file    Forced sexual activity: Not on file  Other Topics Concern  . Not on file  Social History Narrative   Patient lives at home with family.    Caffeine Use: 1 pot of coffee daily    Review of Systems  Constitutional: Negative for fatigue and unexpected weight change.  Eyes: Negative for visual disturbance.  Respiratory: Negative for cough, chest tightness and shortness of breath.   Cardiovascular: Negative for chest pain, palpitations and leg swelling.  Gastrointestinal: Negative for abdominal pain and blood in stool.  Neurological: Negative for dizziness, light-headedness and headaches.     Objective:   Vitals:   02/18/19 0812 02/18/19 0817 02/18/19 0818  BP: (!) 167/74 (!) 157/87 128/68  Pulse: 63    Temp: 98.7 F (37.1 C)    TempSrc: Oral    SpO2: 96%    Weight: 244 lb 3.2 oz (110.8 kg)       Physical Exam Vitals signs reviewed.  Constitutional:      Appearance: He is well-developed.  HENT:     Head: Normocephalic and atraumatic.  Eyes:     Pupils: Pupils are equal, round, and reactive to light.  Neck:     Vascular: No carotid bruit or JVD.  Cardiovascular:     Rate and Rhythm: Normal  rate and regular rhythm.     Heart sounds: Normal heart sounds. No murmur.  Pulmonary:     Effort: Pulmonary effort is normal.     Breath sounds: Normal breath sounds. No rales.  Skin:    General: Skin is warm and dry.  Neurological:     Mental Status: He is alert and oriented to person, place, and time.        Assessment & Plan:  TRAMEL WESTBROOK is a 58 y.o. male . Type 2 diabetes mellitus with hyperglycemia, without long-term current use of insulin (HCC) - Plan: Hemoglobin A1c, glipiZIDE (GLUCOTROL) 5 MG tablet, lisinopril (ZESTRIL) 5 MG tablet, Comprehensive metabolic panel, Microalbumin / creatinine urine ratio  -Check A1c, continue same regimen for now with glipizide and Xigduo.  -Borderline blood pressure but improved with recheck.  No changes in regimen for now  Mixed hyperlipidemia - Plan: atorvastatin (LIPITOR) 40 MG tablet, Lipid Panel  - Stable, tolerating current regimen. Medications refilled. Labs pending as above.   Need for prophylactic vaccination and inoculation against influenza - Plan: Flu Vaccine QUAD 6+ mos PF IM (Fluarix Quad PF)  Hypothyroidism, unspecified type - Plan: TSH, levothyroxine (SYNTHROID) 150 MCG tablet  -  Stable, tolerating current regimen. Medications refilled. Labs pending as above.   OSA on CPAP  -Compliance goal of 100% usage, but denies daytime somnolence..  Does pass from DOT standpoint.    No orders of the defined types were placed in this encounter.  There are no Patient Instructions on file for this visit.    Signed, Merri Ray, MD [facility]

## 2019-02-18 NOTE — Patient Instructions (Addendum)
No changes in medication for now.  Once I review your labs we can discuss if changes needed and then addend DOT paperwork if diabetes controlled.  See information below on diabetes.  Thank you for coming in today.  Recheck 3 months.   Type 2 Diabetes Mellitus, Self Care, Adult When you have type 2 diabetes (type 2 diabetes mellitus), you must make sure your blood sugar (glucose) stays in a healthy range. You can do this with:  Nutrition.  Exercise.  Lifestyle changes.  Medicines or insulin, if needed.  Support from your doctors and others. How to stay aware of blood sugar   Check your blood sugar level every day, as often as told.  Have your A1c (hemoglobin A1c) level checked two or more times a year. Have it checked more often if your doctor tells you to. Your doctor will set personal treatment goals for you. Generally, you should have these blood sugar levels:  Before meals (preprandial): 80-130 mg/dL (4.4-7.2 mmol/L).  After meals (postprandial): below 180 mg/dL (10 mmol/L).  A1c level: less than 7%. How to manage high and low blood sugar Signs of high blood sugar High blood sugar is called hyperglycemia. Know the signs of high blood sugar. Signs may include:  Feeling: ? Thirsty. ? Hungry. ? Very tired.  Needing to pee (urinate) more than usual.  Blurry vision. Signs of low blood sugar Low blood sugar is called hypoglycemia. This is when blood sugar is at or below 70 mg/dL (3.9 mmol/L). Signs may include:  Feeling: ? Hungry. ? Worried or nervous (anxious). ? Sweaty and clammy. ? Confused. ? Dizzy. ? Sleepy. ? Sick to your stomach (nauseous).  Having: ? A fast heartbeat. ? A headache. ? A change in your vision. ? Jerky movements that you cannot control (seizure). ? Tingling or no feeling (numbness) around your mouth, lips, or tongue.  Having trouble with: ? Moving (coordination). ? Sleeping. ? Passing out (fainting). ? Getting upset easily  (irritability). Treating low blood sugar To treat low blood sugar, eat or drink something sugary right away. If you can think clearly and swallow safely, follow the 15:15 rule:  Take 15 grams of a fast-acting carb (carbohydrate). Talk with your doctor about how much you should take.  Some fast-acting carbs are: ? Sugar tablets (glucose pills). Take 3-4 pills. ? 6-8 pieces of hard candy. ? 4-6 oz (120-150 mL) of fruit juice. ? 4-6 oz (120-150 mL) of regular (not diet) soda. ? 1 Tbsp (15 mL) honey or sugar.  Check your blood sugar 15 minutes after you take the carb.  If your blood sugar is still at or below 70 mg/dL (3.9 mmol/L), take 15 grams of a carb again.  If your blood sugar does not go above 70 mg/dL (3.9 mmol/L) after 3 tries, get help right away.  After your blood sugar goes back to normal, eat a meal or a snack within 1 hour. Treating very low blood sugar If your blood sugar is at or below 54 mg/dL (3 mmol/L), you have very low blood sugar (severe hypoglycemia). This is an emergency. Do not wait to see if the symptoms will go away. Get medical help right away. Call your local emergency services (911 in the [place] If you have very low blood sugar and you cannot eat or drink, you may need a glucagon shot (injection). A family member or friend should learn how to check your blood sugar and how to give you a glucagon shot. Ask your  doctor if you need to have a glucagon shot kit at home. Follow these instructions at home: Medicine  Take insulin and diabetes medicines as told.  If your doctor says you should take more or less insulin and medicines, do this exactly as told.  Do not run out of insulin or medicines. Having diabetes can raise your risk for other long-term conditions. These include heart disease and kidney disease. Your doctor may prescribe medicines to help you not have these problems. Food   Make healthy food choices. These include: ? Chicken, fish, egg whites, and  beans. ? Oats, whole wheat, bulgur, brown rice, quinoa, and millet. ? Fresh fruits and vegetables. ? Low-fat dairy products. ? Nuts, avocado, olive oil, and canola oil.  Meet with a food specialist (dietitian). He or she can help you make an eating plan that is right for you.  Follow instructions from your doctor about what you cannot eat or drink.  Drink enough fluid to keep your pee (urine) pale yellow.  Keep track of carbs that you eat. Do this by reading food labels and learning food serving sizes.  Follow your sick day plan when you cannot eat or drink normally. Make this plan with your doctor so it is ready to use. Activity  Exercise 3 or more times a week.  Do not go more than 2 days without exercising.  Talk with your doctor before you start a new exercise. Your doctor may need to tell you to change: ? How much insulin or medicines you take. ? How much food you eat. Lifestyle  Do not use any tobacco products. These include cigarettes, chewing tobacco, and e-cigarettes. If you need help quitting, ask your doctor.  Ask your doctor how much alcohol is safe for you.  Learn to deal with stress. If you need help with this, ask your doctor. Body care   Stay up to date with your shots (immunizations).  Have your eyes and feet checked by a doctor as often as told.  Check your skin and feet every day. Check for cuts, bruises, redness, blisters, or sores.  Brush your teeth and gums two times a day. Floss one or more times a day.  Go to the dentist one or more times every 6 months.  Stay at a healthy weight. General instructions  Take over-the-counter and prescription medicines only as told by your doctor.  Share your diabetes care plan with: ? Your work or school. ? People you live with.  Carry a card or wear jewelry that says you have diabetes.  Keep all follow-up visits as told by your doctor. This is important. Questions to ask your doctor  Do I need to meet  with a diabetes educator?  Where can I find a support group for people with diabetes? Where to find more information To learn more about diabetes, visit:  American Diabetes Association: www.diabetes.org  American Association of Diabetes Educators: www.diabeteseducator.org Summary  When you have type 2 diabetes, you must make sure your blood sugar (glucose) stays in a healthy range.  Check your blood sugar every day, as often as told.  Having diabetes can raise your risk for other conditions. Your doctor may prescribe medicines to help you not have these problems.  Keep all follow-up visits as told by your doctor. This is important. This information is not intended to replace advice given to you by your health care provider. Make sure you discuss any questions you have with your health care provider. Document  Released: 07/10/2015 Document Revised: 09/08/2017 Document Reviewed: 04/21/2015 Elsevier Patient Education  [employer].    If you have lab work done today you will be contacted with your lab results within the next 2 weeks.  If you have not heard from [place] then please contact us. The fastest way to get your results is to register for My Chart.   IF you received an x-ray today, you will receive an invoice from [facility]. Please contact [facility] at [phone] with questions or concerns regarding your invoice.   IF you received labwork today, you will receive an invoice from [place]. Please contact [facility] at [phone] with questions or concerns regarding your invoice.   Our billing staff will not be able to assist you with questions regarding bills from these companies.  You will be contacted with the lab results as soon as they are available. The fastest way to get your results is to activate your My Chart account. Instructions are located on the last page of this paperwork. If you have not heard from [place] regarding the results in 2 weeks, please  contact this office.

## 2019-02-19 LAB — LIPID PANEL
Chol/HDL Ratio: 7.1 ratio — ABNORMAL HIGH (ref 0.0–5.0)
Cholesterol, Total: 193 mg/dL (ref 100–199)
HDL: 27 mg/dL — ABNORMAL LOW (ref >39)
LDL Chol Calc (NIH): 72 mg/dL (ref 0–99)
Triglycerides: 602 mg/dL (ref 0–149)
VLDL Cholesterol Cal: 94 mg/dL — ABNORMAL HIGH (ref 5–40)

## 2019-02-19 LAB — MICROALBUMIN / CREATININE URINE RATIO
Creatinine, Urine: 41.1 mg/dL
Microalb/Creat Ratio: 38 mg/g creat — ABNORMAL HIGH (ref 0–29)
Microalbumin, Urine: 15.7 ug/mL

## 2019-02-19 LAB — COMPREHENSIVE METABOLIC PANEL
ALT: 29 IU/L (ref 0–44)
AST: 22 IU/L (ref 0–40)
Albumin/Globulin Ratio: 2.2 (ref 1.2–2.2)
Albumin: 5.1 g/dL — ABNORMAL HIGH (ref 3.8–4.9)
Alkaline Phosphatase: 82 IU/L (ref 39–117)
BUN/Creatinine Ratio: 15 (ref 9–20)
BUN: 15 mg/dL (ref 6–24)
Bilirubin Total: <0.2 mg/dL (ref 0.0–1.2)
CO2: 20 mmol/L (ref 20–29)
Calcium: 10.3 mg/dL — ABNORMAL HIGH (ref 8.7–10.2)
Chloride: 98 mmol/L (ref 96–106)
Creatinine, Ser: 0.97 mg/dL (ref 0.76–1.27)
GFR calc Af Amer: 99 mL/min/{1.73_m2} (ref >59)
GFR calc non Af Amer: 86 mL/min/{1.73_m2} (ref >59)
Globulin, Total: 2.3 g/dL (ref 1.5–4.5)
Glucose: 227 mg/dL — ABNORMAL HIGH (ref 65–99)
Potassium: 4.9 mmol/L (ref 3.5–5.2)
Sodium: 138 mmol/L (ref 134–144)
Total Protein: 7.4 g/dL (ref 6.0–8.5)

## 2019-02-19 LAB — HEMOGLOBIN A1C
Est. average glucose Bld gHb Est-mCnc: 192 mg/dL
Hgb A1c MFr Bld: 8.3 % — ABNORMAL HIGH (ref 4.8–5.6)

## 2019-02-19 LAB — TSH: TSH: 7.05 u[IU]/mL — ABNORMAL HIGH (ref 0.450–4.500)

## 2019-03-01 ENCOUNTER — Other Ambulatory Visit: Payer: Self-pay | Admitting: Family Medicine

## 2019-03-05 ENCOUNTER — Encounter: Payer: Self-pay | Admitting: Family Medicine

## 2019-03-05 DIAGNOSIS — E782 Mixed hyperlipidemia: Secondary | ICD-10-CM

## 2019-03-05 DIAGNOSIS — E039 Hypothyroidism, unspecified: Secondary | ICD-10-CM

## 2019-03-09 MED ORDER — LEVOTHYROXINE SODIUM 175 MCG PO TABS: 175.0000 ug | ORAL_TABLET | Freq: Every day | ORAL | 0 refills | Status: DC

## 2019-03-09 MED ORDER — ATORVASTATIN CALCIUM 80 MG PO TABS: 80.0000 mg | ORAL_TABLET | Freq: Every day | ORAL | 0 refills | Status: DC

## 2019-03-22 ENCOUNTER — Other Ambulatory Visit: Payer: Self-pay | Admitting: Physician Assistant

## 2019-03-31 DIAGNOSIS — R21 Rash and other nonspecific skin eruption: Secondary | ICD-10-CM | POA: Diagnosis not present

## 2019-04-05 ENCOUNTER — Encounter: Payer: Self-pay | Admitting: Physician Assistant

## 2019-04-05 ENCOUNTER — Ambulatory Visit (INDEPENDENT_AMBULATORY_CARE_PROVIDER_SITE_OTHER): Payer: BC Managed Care – PPO | Admitting: Physician Assistant

## 2019-04-05 ENCOUNTER — Other Ambulatory Visit: Payer: Self-pay

## 2019-04-05 DIAGNOSIS — E039 Hypothyroidism, unspecified: Secondary | ICD-10-CM

## 2019-04-05 DIAGNOSIS — F411 Generalized anxiety disorder: Secondary | ICD-10-CM | POA: Diagnosis not present

## 2019-04-05 DIAGNOSIS — F3341 Major depressive disorder, recurrent, in partial remission: Secondary | ICD-10-CM

## 2019-04-05 MED ORDER — BUSPIRONE HCL 15 MG PO TABS: ORAL_TABLET | ORAL | 1 refills | Status: DC

## 2019-04-05 MED ORDER — LAMOTRIGINE 200 MG PO TABS: 200.0000 mg | ORAL_TABLET | Freq: Two times a day (BID) | ORAL | 1 refills | Status: DC

## 2019-04-05 NOTE — Progress Notes (Signed)
[facility]  Patient ID: Robert Wise,  MRN: [id number]  PCP: [name], MD  Date of Evaluation: 04/05/2019 Time spent:15 minutes  Chief Complaint:  Chief Complaint    Follow-up      HISTORY/CURRENT STATUS: HPI For routine med check.  At [place] we added Modafinil.  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  He is not really sure if it was helpful or not.  The big news is that he saw his PCP since the last visit and his TSH was abnormal.  They increased his Synthroid and within a week of having done that, he felt a lot better.  He now has more energy.  He is not hitting the snooze button on his alarm clock over and over like he used to.  He is really happy to be feeling better.  Motivation is good.  He is able to enjoy things.  He sleeps well.  Denies suicidal or homicidal thoughts.  Anxiety is well controlled with the BuSpar.  No panic attacks.  He does still have anxiety on occasion but not often.  Patient denies increased energy with decreased need for sleep, no increased talkativeness, no racing thoughts, no impulsivity or risky behaviors, no increased spending, no increased libido, no grandiosity.  Denies dizziness, syncope, seizures, numbness, tingling, tremor, tics, unsteady gait, slurred speech, confusion. Denies muscle or joint pain, stiffness, or dystonia.  Individual Medical History/ Review of Systems: Changes? 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  atorvastatin (LIPITOR) 80  MG tablet, Take 1 tablet (80 mg total) by mouth daily., Disp: 90 tablet, Rfl: 0 .  B Complex Vitamins (B-COMPLEX/B-12 PO), Take by mouth., Disp: , Rfl:  .  blood glucose meter kit and supplies KIT, Dispense based on patient and insurance preference. Use up to four times daily as directed. (FOR ICD-9 250.00, 250.01)., Disp: 1 each, Rfl: 0 .  Blood Glucose Monitoring Suppl (ONE TOUCH ULTRA SYSTEM KIT) W/DEVICE KIT, 1 kit by Does not apply route once., Disp: 1 each, Rfl: 0 .  busPIRone (BUSPAR) 15 MG tablet, TAKE 2 TABLETS BY MOUTH IN THE MORNING AND 1 AT BEDTIME, Disp: 270 tablet, Rfl: 1 .  cholecalciferol (VITAMIN D3) 25 MCG (1000 UT) tablet, Take 2,000 Units by mouth daily., Disp: , Rfl:  .  Dapagliflozin-metFORMIN HCl ER (XIGDUO XR) 07-998 MG TB24, Take 2 tablets by mouth daily., Disp: 180 tablet, Rfl: 1 .  esomeprazole (NEXIUM) 40 MG capsule, Take 1 capsule (40 mg total) by mouth daily., Disp: 90 capsule, Rfl: 3 .  ferrous sulfate 325 (65 FE) MG tablet, Take 65 mg by mouth daily with breakfast., Disp: , Rfl:  .  fish oil-omega-3 fatty acids 1000 MG capsule, Take 2 g by mouth daily., Disp: , Rfl:  .  Ginkgo Biloba Extract (GNP GINGKO BILOBA EXTRACT) 60 MG CAPS, Take by mouth., Disp: , Rfl:  .  glipiZIDE (GLUCOTROL) 5 MG tablet, Take 1 tablet (5 mg total) by mouth daily before breakfast., Disp: 90 tablet, Rfl: 1 .  glucose blood (  ONE TOUCH ULTRA TEST) test strip, 1 each by Other route daily., Disp: 100 each, Rfl: 8 .  lamoTRIgine (LAMICTAL) 200 MG tablet, Take 1 tablet (200 mg total) by mouth 2 (two) times daily., Disp: 180 tablet, Rfl: 1 .  Lancets (ONETOUCH ULTRASOFT) lancets, USE AS INSTRUCTED, Disp: 100 each, Rfl: 0 .  levothyroxine (SYNTHROID) 175 MCG tablet, Take 1 tablet (175 mcg total) by mouth daily before breakfast., Disp: 90 tablet, Rfl: 0 .  lisinopril (ZESTRIL) 5 MG tablet, Take 1 tablet (5 mg total) by mouth daily., Disp: 90 tablet, Rfl: 1 .  PARoxetine (PAXIL) 30 MG tablet, TAKE 2  TABLETS DAILY, Disp: 180 tablet, Rfl: 0 Medication Side Effects: none  Family Medical/ Social History: Changes? No  MENTAL HEALTH EXAM:  There were no vitals taken for this visit.There is no height or weight on file to calculate BMI.  General Appearance: Casual, Neat and Well Groomed  Eye Contact:  Good  Speech:  Clear and Coherent  Volume:  Normal  Mood:  Euthymic  Affect:  Appropriate  Thought Process:  Goal Directed and Descriptions of Associations: Intact  Orientation:  Full (Time, Place, and Person)  Thought Content: Logical   Suicidal Thoughts:  No  Homicidal Thoughts:  No  Memory:  WNL  Judgement:  Good  Insight:  Good  Psychomotor Activity:  Normal  Concentration:  Concentration: Good  Recall:  Good  Fund of Knowledge: Good  Language: Good  Assets:  Desire for Improvement  ADL's:  Intact  Cognition: WNL  Prognosis:  Good    DIAGNOSES:    ICD-10-CM   1. Recurrent major depressive disorder, in partial remission ([place])  F33.41   2. Generalized anxiety disorder  F41.1   3. Hypothyroidism, unspecified type  E03.9     Receiving Psychotherapy: No    RECOMMENDATIONS:  I am glad to see him doing so much better! Continue BuSpar 15 mg, 2 p.o. every morning and 1 nightly. Continue Lamictal 200 mg, 1 p.o. twice daily. Continue Paxil 30 mg, 2 p.o. daily. Continue B complex, vitamin D, ferrous sulfate, fish oil, ginkgo biloba. Return in 3 months.  If he still doing well at that visit and would like to go to 31-monthintervals between appointments that is fine.  TDonnal Moat PA-C

## 2019-04-26 ENCOUNTER — Other Ambulatory Visit: Payer: Self-pay

## 2019-04-26 ENCOUNTER — Ambulatory Visit (INDEPENDENT_AMBULATORY_CARE_PROVIDER_SITE_OTHER): Payer: BC Managed Care – PPO | Admitting: Family Medicine

## 2019-04-26 ENCOUNTER — Encounter: Payer: Self-pay | Admitting: Family Medicine

## 2019-04-26 VITALS — BP 136/72 | HR 79 | Temp 97.9°F | Ht 72.0 in | Wt 237.0 lb

## 2019-04-26 DIAGNOSIS — E782 Mixed hyperlipidemia: Secondary | ICD-10-CM | POA: Diagnosis not present

## 2019-04-26 DIAGNOSIS — B356 Tinea cruris: Secondary | ICD-10-CM | POA: Diagnosis not present

## 2019-04-26 DIAGNOSIS — E039 Hypothyroidism, unspecified: Secondary | ICD-10-CM

## 2019-04-26 DIAGNOSIS — E1165 Type 2 diabetes mellitus with hyperglycemia: Secondary | ICD-10-CM | POA: Diagnosis not present

## 2019-04-26 MED ORDER — GLIPIZIDE 5 MG PO TABS: 5.0000 mg | ORAL_TABLET | Freq: Two times a day (BID) | ORAL | 1 refills | Status: DC

## 2019-04-26 MED ORDER — CLOTRIMAZOLE 1 % EX CREA: 1.0000 "application " | TOPICAL_CREAM | Freq: Two times a day (BID) | CUTANEOUS | 0 refills | Status: DC

## 2019-04-26 NOTE — Patient Instructions (Addendum)
Rash in groin appears to be tinea cruris or jock itch.  Apply antifungal cream twice daily, try to keep area dry.  Follow-up if not improving over next 2 weeks.  I will recheck cholesterol and thyroid testing today.  There is little bit too early to recheck her A1c.  Return for lab only visit in the next 6 weeks.  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  Make sure to use CPAP machine every night for at least 4 hours.  Please let me know if there are questions.   Jock Itch Jock itch (tinea cruris) is an infection of the skin in the groin area. It is caused by a fungus, which is a type of germ that lives in dark, damp places. Jock itch causes an itchy rash in the groin and upper thigh area. It usually goes away in 2-3 weeks with treatment. What are the causes? The fungus that causes jock itch may be spread by:  Touching a fungus infection elsewhere on your body, such as athlete's foot, and then touching your groin area.  Sharing towels or clothing, such as socks or shoes, with someone who has a fungal infection. What increases the risk? Jock itch is most common in men and adolescent boys. You are also more likely to develop the condition if you:  Are in a hot, humid climate.  Wear tight-fitting clothing or wet bathing suits for long periods of time.  Play sports.  Are overweight.  Have diabetes.  Have a weakened immune system.  Sweat a lot. What are the signs or symptoms? Symptoms of jock itch may include:  A red, pink, or brown rash in the groin area. Blisters may be present. The rash may spread to the thighs, anus, and buttocks.  Dry and scaly skin on or around the rash.  Itchiness. How is this diagnosed? In most cases, your health care provider can make the diagnosis by looking at your rash. In some cases, a sample of infected skin may be scraped off. This sample may be examined under a microscope (biopsy) or by trying to grow the fungus from the sample  (culture). How is this treated? Treatment for this condition may include:  Antifungal medicine to kill the fungus. This may be a skin cream, ointment, or powder, or it may be a medicine that you take by mouth.  Skin cream or ointment to reduce itching.  Lifestyle changes, such as wearing looser clothing and caring for your skin. Follow these instructions at home: Skin care  Apply skin creams, ointments, or powders exactly as told by your health care provider.  Wear loose-fitting clothing that does not rub against your groin area. Men should wear boxer shorts or loose-fitting underwear.  Keep your groin area clean and dry. ? Change your underwear every day. ? Change out of wet bathing suits as soon as possible. ? After bathing, use a separate towel to dry your groin area thoroughly and gently. Using a separate towel will help prevent spreading the infection to other areas of your body.  Avoid hot baths and showers. Hot water can make itching worse.  Do not scratch the affected area. General instructions  Take and apply over-the-counter and prescription medicines only as told by your health care provider.  Do not share towels, clothing, or personal items with other people.  Wash your hands often with soap and water, especially after touching your groin area. If soap and water are not available, use alcohol-based hand  sanitizer. Contact a health care provider if:  Your rash: ? Gets worse or does not get better after 2 weeks of treatment. ? Spreads. ? Returns after treatment is finished.  You have any of the following: ? A fever. ? New or worsening redness, swelling, or pain around your rash. ? Fluid, blood, or pus coming from your rash. Summary  Jock itch (tinea cruris) is a fungal infection of the skin in the groin area.  The fungus can be spread by sharing clothing or by touching a fungus infection elsewhere on your body and then touching your groin area.  Treatment may  include antifungal medicine and lifestyle changes, such as keeping the area clean and dry. This information is not intended to replace advice given to you by your health care provider. Make sure you discuss any questions you have with your health care provider. Document Revised: 02/28/2017 Document Reviewed: 02/26/2017 Elsevier Patient Education  [employer].    If you have lab work done today you will be contacted with your lab results within the next 2 weeks.  If you have not heard from [place] then please contact us. The fastest way to get your results is to register for My Chart.   IF you received an x-ray today, you will receive an invoice from [facility]. Please contact [facility] at [phone] with questions or concerns regarding your invoice.   IF you received labwork today, you will receive an invoice from [facility]. Please contact [facility] at [phone] with questions or concerns regarding your invoice.   Our billing staff will not be able to assist you with questions regarding bills from these companies.  You will be contacted with the lab results as soon as they are available. The fastest way to get your results is to activate your My Chart account. Instructions are located on the last page of this paperwork. If you have not heard from [place] regarding the results in 2 weeks, please contact this office.

## 2019-04-26 NOTE — Progress Notes (Signed)
Subjective:  Patient ID: Robert Wise, male    DOB: [birth year]  Age: 59 y.o. MRN: [id number]  CC:  Chief Complaint  Patient presents with  . Follow-up    follow up on diebetes and to extend DOT. pt hasn't had any issues with his Blood sugar since last visit. pt hasn't had any episodes of high/low blood sugar since his last visit.    HPI Robert Wise presents for   Diabetes: Complicated by hyperglycemia.  A1c had increased from 7.8-8.3 at last visit in November.  Improvement in diet discussed as well as exercise, increased glipizide to twice per day including with evening meal, for total dosing of 5 mg twice daily.  Continued on Xigduo 10/2000mg total dosing per day. He is on statin, Lipitor 80 mg daily.  Home readings: 130-140 Fasting: 2hr PP: 150's.  Highest 180.  No symptomatic lows.   Diabetic Foot Exam - Simple   Simple Foot Form Visual Inspection No deformities, no ulcerations, no other skin breakdown bilaterally: Yes Sensation Testing See comments: Yes Pulse Check Posterior Tibialis and Dorsalis pulse intact bilaterally: Yes Comments Pt could not feel the touch on pt's R heal. All other sensations were normal    Microalbumin:  Lab Results  Component Value Date   LABMICR 15.7 02/18/2019   LABMICR 4.7 04/11/2017   MICROALBUR 2.1 (H) 08/01/2014   Optho, foot exam, pneumovax: Up-to-date with foot exam today  Lab Results  Component Value Date   HGBA1C 8.3 (H) 02/18/2019   HGBA1C 7.8 (A) 04/17/2018   HGBA1C 14.0 (A) 02/10/2018   Lab Results  Component Value Date   MICROALBUR 2.1 (H) 08/01/2014   LDLCALC 72 02/18/2019   CREATININE 0.97 02/18/2019   Hyperlipidemia: Significant hypertriglyceridemia in November.  Lipitor increased to 80 mg daily from 40 mg. No new myalgias.  Lab Results  Component Value Date   CHOL 193 02/18/2019   HDL 27 (L) 02/18/2019   LDLCALC 72 02/18/2019   TRIG 602 (HH) 02/18/2019   CHOLHDL 7.1 (H) 02/18/2019   Lab Results    Component Value Date   ALT 29 02/18/2019   AST 22 02/18/2019   ALKPHOS 82 02/18/2019   BILITOT <0.2 02/18/2019   Hypothyroidism: Lab Results  Component Value Date   TSH 7.050 (H) 02/18/2019  Mild elevation of TSH in November, Synthroid increased from 150 to 175 mcg daily. Feeling better - more energy.    See prior visit for DOT physical.  He did have a compliance report from October 5 through November 3 on his CPAP indicating 83% days of usage, 77% greater than 4 hours use.  Additionally there was a letter sent from [facility].  Compliant with medications, no concerns for his ability to drive.  Cleared to drive without restrictions.  Rash: In groin folds.  Treated by provider in [place] with cream for "baby rash".  Unknown med- using 2 times per day. Using past 3-4 weeks.  Itchy rash.  No other rash, no foot rash,    History Patient Active Problem List   Diagnosis Date Noted  . Anxiety state 01/25/2018  . Fatigue 01/25/2018  . Uncontrolled type 2 diabetes mellitus with complication, without long-term current use of insulin ([place]) 02/10/2015  . Chronic pain 12/01/2014  . Depression 10/12/2014   Past Medical History:  Diagnosis Date  . Anxiety   . Depression   . Diabetes mellitus without complication ([place])   . Hyperlipidemia   . OSA (obstructive sleep apnea)  12/15/2012   Past Surgical History:  Procedure Laterality Date  . ANKLE SURGERY Left 2006  . VASECTOMY     Allergies  Allergen Reactions  . Tricor [Fenofibrate]   . Zocor [Simvastatin]    Prior to Admission medications   Medication Sig Start Date End Date Taking? Authorizing Provider  atorvastatin (LIPITOR) 80 MG tablet Take 1 tablet (80 mg total) by mouth daily. 03/09/19  Yes [name], MD  B Complex Vitamins (B-COMPLEX/B-12 PO) Take by mouth.   Yes [provider]  blood glucose meter kit and supplies KIT Dispense based on patient and insurance preference. Use up to four  times daily as directed. (FOR ICD-9 250.00, 250.01). 01/07/19  Yes Stallings, Zoe A, MD  Blood Glucose Monitoring Suppl (ONE TOUCH ULTRA SYSTEM KIT) W/DEVICE KIT 1 kit by Does not apply route once. 10/25/11  Yes Weber, Sarah L, PA-C  busPIRone (BUSPAR) 15 MG tablet TAKE 2 TABLETS BY MOUTH IN THE MORNING AND 1 AT BEDTIME 04/05/19  Yes Hurst, Teresa T, PA-C  cholecalciferol (VITAMIN D3) 25 MCG (1000 UT) tablet Take 2,000 Units by mouth daily.   Yes [provider]  Dapagliflozin-metFORMIN HCl ER (XIGDUO XR) 07-998 MG TB24 Take 2 tablets by mouth daily. 02/18/19  Yes [name], MD  esomeprazole (NEXIUM) 40 MG capsule Take 1 capsule (40 mg total) by mouth daily. 04/11/17  Yes Jaynee Eagles, PA-C  ferrous sulfate 325 (65 FE) MG tablet Take 65 mg by mouth daily with breakfast.   Yes [provider]  fish oil-omega-3 fatty acids 1000 MG capsule Take 2 g by mouth daily.   Yes [provider]  Ginkgo Biloba Extract (GNP GINGKO BILOBA EXTRACT) 60 MG CAPS Take by mouth.   Yes [provider]  glipiZIDE (GLUCOTROL) 5 MG tablet Take 1 tablet (5 mg total) by mouth daily before breakfast. 02/18/19  Yes [name], MD  glucose blood (ONE TOUCH ULTRA TEST) test strip 1 each by Other route daily. 12/01/14  Yes Jaynee Eagles, PA-C  lamoTRIgine (LAMICTAL) 200 MG tablet Take 1 tablet (200 mg total) by mouth 2 (two) times daily. 04/05/19  Yes Donnal Moat T, PA-C  Lancets [facility] ULTRASOFT) lancets USE AS INSTRUCTED 11/28/13  Yes [name], MD  levothyroxine (SYNTHROID) 175 MCG tablet Take 1 tablet (175 mcg total) by mouth daily before breakfast. 03/09/19  Yes [name], MD  lisinopril (ZESTRIL) 5 MG tablet Take 1 tablet (5 mg total) by mouth daily. 02/18/19  Yes [name], MD  PARoxetine (PAXIL) 30 MG tablet TAKE 2 TABLETS DAILY 03/23/19  Yes Addison Lank, PA-C   Social History   Socioeconomic History  . Marital status: Married    Spouse name: [name]    . Number of children: 3  . Years of education: 11.5  . Highest education level: Not on file  Occupational History    Employer: [employer]    Comment: Driver  Tobacco Use  . Smoking status: Former Smoker    Packs/day: 1.50    Years: 37.00    Pack years: 55.50    Types: Cigarettes    Quit date: 05/07/2017    Years since quitting: 1.9  . Smokeless tobacco: Never Used  Substance and Sexual Activity  . Alcohol use: No    Comment: quit: 08/14/2011  . Drug use: No  . Sexual activity: Yes    Birth control/protection: None  Other Topics Concern  . Not on file  Social History Narrative  Patient lives at home with family.    Caffeine Use: 1 pot of coffee daily   Social Determinants of Health   Financial Resource Strain:   . Difficulty of Paying Living Expenses: Not on file  Food Insecurity:   . Worried About Charity fundraiser in the Last Year: Not on file  . Ran Out of Food in the Last Year: Not on file  Transportation Needs:   . Lack of Transportation (Medical): Not on file  . Lack of Transportation (Non-Medical): Not on file  Physical Activity:   . Days of Exercise per Week: Not on file  . Minutes of Exercise per Session: Not on file  Stress:   . Feeling of Stress : Not on file  Social Connections:   . Frequency of Communication with Friends and Family: Not on file  . Frequency of Social Gatherings with Friends and Family: Not on file  . Attends Religious Services: Not on file  . Active Member of Clubs or Organizations: Not on file  . Attends Archivist Meetings: Not on file  . Marital Status: Not on file  Intimate Partner Violence:   . Fear of Current or Ex-Partner: Not on file  . Emotionally Abused: Not on file  . Physically Abused: Not on file  . Sexually Abused: Not on file    Review of Systems  Constitutional: Negative for fatigue and unexpected weight change.  Eyes: Negative for visual disturbance.  Respiratory: Negative for cough, chest tightness  and shortness of breath.   Cardiovascular: Negative for chest pain, palpitations and leg swelling.  Gastrointestinal: Negative for abdominal pain and blood in stool.  Skin: Positive for rash (groin. ).  Neurological: Negative for dizziness, light-headedness and headaches.     Objective:   Vitals:   04/26/19 0805 04/26/19 0810  BP: (!) 155/79 136/72  Pulse: 79   Temp: 97.9 F (36.6 C)   TempSrc: Temporal   SpO2: 97%   Weight: 237 lb (107.5 kg)   Height: 6' (1.829 m)      Physical Exam Vitals reviewed.  Constitutional:      Appearance: He is well-developed.  HENT:     Head: Normocephalic and atraumatic.  Eyes:     Pupils: Pupils are equal, round, and reactive to light.  Neck:     Vascular: No carotid bruit or JVD.  Cardiovascular:     Rate and Rhythm: Normal rate and regular rhythm.     Heart sounds: Normal heart sounds. No murmur.  Pulmonary:     Effort: Pulmonary effort is normal.     Breath sounds: Normal breath sounds. No rales.  Skin:    General: Skin is warm and dry.       Neurological:     Mental Status: He is alert and oriented to person, place, and time.        Assessment & Plan:  Robert Wise is a 59 y.o. male . Type 2 diabetes mellitus with hyperglycemia, without long-term current use of insulin (HCC) - Plan: glipiZIDE (GLUCOTROL) 5 MG tablet, Hemoglobin A1c  -Continue glipizide 5 mg twice daily, same dose of Xigduo.  Plan for A1c in 6 weeks.  Few elevated readings, may need further adjustments.  Mixed hyperlipidemia - Plan: Comprehensive metabolic panel, Lipid panel  -Tolerating higher dose of statin, continue same  Hypothyroidism, unspecified type - Plan: TSH  -Symptomatically improved on higher dose of Synthroid, check TSH  Tinea cruris - Plan: clotrimazole (LOTRIMIN) 1 % cream  -  Suspected tinea cruris, possible candida with few satellite lesions.  Initially triy clotrimazole, preventative techniques discussed with handout given.   Meds  ordered this encounter  Medications  . clotrimazole (LOTRIMIN) 1 % cream    Sig: Apply 1 application topically 2 (two) times daily.    Dispense:  30 g    Refill:  0  . glipiZIDE (GLUCOTROL) 5 MG tablet    Sig: Take 1 tablet (5 mg total) by mouth 2 (two) times daily before a meal.    Dispense:  180 tablet    Refill:  1   Patient Instructions   Rash in groin appears to be tinea cruris or jock itch.  Apply antifungal cream twice daily, try to keep area dry.  Follow-up if not improving over next 2 weeks.  I will recheck cholesterol and thyroid testing today.  There is little bit too early to recheck her A1c.  Return for lab only visit in the next 6 weeks.  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  Make sure to use CPAP machine every night for at least 4 hours.  Please let me know if there are questions.   If you have lab work done today you will be contacted with your lab results within the next 2 weeks.  If you have not heard from [place] then please contact us. The fastest way to get your results is to register for My Chart.   IF you received an x-ray today, you will receive an invoice from [facility] Radiology. Please contact [facility]iology at [phone] with questions or concerns regarding your invoice.   IF you received labwork today, you will receive an invoice from [place]. Please contact [facility] at [phone] with questions or concerns regarding your invoice.   Our billing staff will not be able to assist you with questions regarding bills from these companies.  You will be contacted with the lab results as soon as they are available. The fastest way to get your results is to activate your My Chart account. Instructions are located on the last page of this paperwork. If you have not heard from [place] regarding the results in 2 weeks, please contact this office.          Signed, Merri Ray, MD [facility]

## 2019-04-27 DIAGNOSIS — S838X1A Sprain of other specified parts of right knee, initial encounter: Secondary | ICD-10-CM | POA: Diagnosis not present

## 2019-04-27 LAB — COMPREHENSIVE METABOLIC PANEL
ALT: 16 IU/L (ref 0–44)
AST: 17 IU/L (ref 0–40)
Albumin/Globulin Ratio: 2 (ref 1.2–2.2)
Albumin: 5 g/dL — ABNORMAL HIGH (ref 3.8–4.9)
Alkaline Phosphatase: 75 IU/L (ref 39–117)
BUN/Creatinine Ratio: 21 — ABNORMAL HIGH (ref 9–20)
BUN: 17 mg/dL (ref 6–24)
Bilirubin Total: 0.3 mg/dL (ref 0.0–1.2)
CO2: 21 mmol/L (ref 20–29)
Calcium: 9.7 mg/dL (ref 8.7–10.2)
Chloride: 103 mmol/L (ref 96–106)
Creatinine, Ser: 0.81 mg/dL (ref 0.76–1.27)
GFR calc Af Amer: 113 mL/min/{1.73_m2} (ref >59)
GFR calc non Af Amer: 98 mL/min/{1.73_m2} (ref >59)
Globulin, Total: 2.5 g/dL (ref 1.5–4.5)
Glucose: 123 mg/dL — ABNORMAL HIGH (ref 65–99)
Potassium: 4.6 mmol/L (ref 3.5–5.2)
Sodium: 142 mmol/L (ref 134–144)
Total Protein: 7.5 g/dL (ref 6.0–8.5)

## 2019-04-27 LAB — LIPID PANEL
Chol/HDL Ratio: 4.8 ratio (ref 0.0–5.0)
Cholesterol, Total: 148 mg/dL (ref 100–199)
HDL: 31 mg/dL — ABNORMAL LOW (ref >39)
LDL Chol Calc (NIH): 90 mg/dL (ref 0–99)
Triglycerides: 151 mg/dL — ABNORMAL HIGH (ref 0–149)
VLDL Cholesterol Cal: 27 mg/dL (ref 5–40)

## 2019-04-27 LAB — TSH: TSH: 5.36 u[IU]/mL — ABNORMAL HIGH (ref 0.450–4.500)

## 2019-05-24 ENCOUNTER — Ambulatory Visit: Payer: BC Managed Care – PPO | Admitting: Family Medicine

## 2019-06-06 ENCOUNTER — Other Ambulatory Visit: Payer: Self-pay | Admitting: Family Medicine

## 2019-06-06 DIAGNOSIS — E782 Mixed hyperlipidemia: Secondary | ICD-10-CM

## 2019-06-06 DIAGNOSIS — E039 Hypothyroidism, unspecified: Secondary | ICD-10-CM

## 2019-06-06 NOTE — Telephone Encounter (Signed)
Requested Prescriptions  Pending Prescriptions Disp Refills  . atorvastatin (LIPITOR) 80 MG tablet [Pharmacy Med Name: ATORVASTATIN 80 MG TABLET] 90 tablet 0    Sig: TAKE 1 TABLET BY MOUTH EVERY DAY     Cardiovascular:  Antilipid - Statins Failed - 06/06/2019  9:11 AM      Failed - LDL in normal range and within 360 days    LDL Chol Calc (NIH)  Date Value Ref Range Status  04/26/2019 90 0 - 99 mg/dL Final         Failed - HDL in normal range and within 360 days    HDL  Date Value Ref Range Status  04/26/2019 31 (L) >39 mg/dL Final         Failed - Triglycerides in normal range and within 360 days    Triglycerides  Date Value Ref Range Status  04/26/2019 151 (H) 0 - 149 mg/dL Final         Passed - Total Cholesterol in normal range and within 360 days    Cholesterol, Total  Date Value Ref Range Status  04/26/2019 148 100 - 199 mg/dL Final         Passed - Patient is not pregnant      Passed - Valid encounter within last 12 months    Recent Outpatient Visits          1 month ago Type 2 diabetes mellitus with hyperglycemia, without long-term current use of insulin (HCC)   [facility] at [facility], Asencion Partridge, MD   3 months ago Type 2 diabetes mellitus with hyperglycemia, without long-term current use of insulin [facility])   [facility] at [facility], Asencion Partridge, MD   4 months ago Encounter for commercial driver medical examination (CDME)   [facility] at [facility], Asencion Partridge, MD   1 year ago Type 2 diabetes mellitus with hyperglycemia, without long-term current use of insulin [facility])   [facility] at [facility], Asencion Partridge, MD   1 year ago Type 2 diabetes mellitus with hyperglycemia, without long-term current use of insulin [facility])   Primary Care at [facility], Eilleen Kempf, MD      Future Appointments            In 1 month Shade Flood, MD Primary Care at [place], [facility]           . levothyroxine (SYNTHROID) 175 MCG tablet [Pharmacy Med Name:  LEVOTHYROXINE 175 MCG TABLET] 90 tablet 0    Sig: TAKE 1 TABLET (175 MCG TOTAL) BY MOUTH DAILY BEFORE BREAKFAST.     Endocrinology:  Hypothyroid Agents Failed - 06/06/2019  9:11 AM      Failed - TSH needs to be rechecked within 3 months after an abnormal result. Refill until TSH is due.      Failed - TSH in normal range and within 360 days    TSH  Date Value Ref Range Status  04/26/2019 5.360 (H) 0.450 - 4.500 uIU/mL Final         Passed - Valid encounter within last 12 months    Recent Outpatient Visits          1 month ago Type 2 diabetes mellitus with hyperglycemia, without long-term current use of insulin [facility])   [facility] at [facility], Asencion Partridge, MD   3 months ago Type 2 diabetes mellitus with hyperglycemia, without long-term current use of insulin [facility])   [facility] at [facility], Asencion Partridge, MD  4 months ago 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

## 2019-06-14 ENCOUNTER — Other Ambulatory Visit: Payer: Self-pay | Admitting: Physician Assistant

## 2019-06-21 ENCOUNTER — Other Ambulatory Visit: Payer: Self-pay | Admitting: Family Medicine

## 2019-06-21 DIAGNOSIS — B356 Tinea cruris: Secondary | ICD-10-CM

## 2019-06-21 MED ORDER — CLOTRIMAZOLE 1 % EX CREA: 1.0000 "application " | TOPICAL_CREAM | Freq: Two times a day (BID) | CUTANEOUS | 0 refills | Status: DC

## 2019-07-05 ENCOUNTER — Encounter: Payer: Self-pay | Admitting: Physician Assistant

## 2019-07-05 ENCOUNTER — Ambulatory Visit (INDEPENDENT_AMBULATORY_CARE_PROVIDER_SITE_OTHER): Payer: BC Managed Care – PPO | Admitting: Physician Assistant

## 2019-07-05 DIAGNOSIS — F17200 Nicotine dependence, unspecified, uncomplicated: Secondary | ICD-10-CM

## 2019-07-05 DIAGNOSIS — F411 Generalized anxiety disorder: Secondary | ICD-10-CM | POA: Diagnosis not present

## 2019-07-05 DIAGNOSIS — F3341 Major depressive disorder, recurrent, in partial remission: Secondary | ICD-10-CM | POA: Diagnosis not present

## 2019-07-05 DIAGNOSIS — G4733 Obstructive sleep apnea (adult) (pediatric): Secondary | ICD-10-CM | POA: Diagnosis not present

## 2019-07-05 MED ORDER — BUSPIRONE HCL 30 MG PO TABS: 30.0000 mg | ORAL_TABLET | Freq: Two times a day (BID) | ORAL | 1 refills | Status: DC

## 2019-07-05 MED ORDER — LAMOTRIGINE 200 MG PO TABS: 200.0000 mg | ORAL_TABLET | Freq: Two times a day (BID) | ORAL | 1 refills | Status: DC

## 2019-07-05 MED ORDER — PAROXETINE HCL 30 MG PO TABS: ORAL_TABLET | ORAL | 1 refills | Status: DC

## 2019-07-05 NOTE — Progress Notes (Signed)
[facility] Med Check  Patient ID: Robert Wise,  MRN: [id number]  PCP: [name], MD  Date of Evaluation: 07/05/2019 Time spent:20 minutes  Chief Complaint:  Chief Complaint    Depression; Anxiety     Virtual Visit via Telephone Note  I connected with patient by a video enabled telemedicine application or telephone, with their informed consent, and verified patient privacy and that I am speaking with the correct person using two identifiers.  I am private, in my office and the patient is in his vehicle.  I discussed the limitations, risks, security and privacy concerns of performing an evaluation and management service by telephone and the availability of in person appointments. I also discussed with the patient that there may be a patient responsible charge related to this service. The patient expressed understanding and agreed to proceed.   I discussed the assessment and treatment plan with the patient. The patient was provided an opportunity to ask questions and all were answered. The patient agreed with the plan and demonstrated an understanding of the instructions.   The patient was advised to call back or seek an in-person evaluation if the symptoms worsen or if the condition fails to improve as anticipated.  I provided 20 minutes of non-face-to-face time during this encounter.  HISTORY/CURRENT STATUS: HPI For routine med check.   "I feel good. But, I started back smoking.  I just wanted a cigarette 1 day and I started smoking again a few months ago.  I had quit for a year and a half.  One time I quit for 3 years."  States that he was having more anxiety and a little bit of irritability so we increase the BuSpar up to 30 mg twice daily.  He has felt a whole lot better on that dose.  Not anxious hardly at all.  He uses a CPAP but does get aggravated with it.  He feels better the next day when he wears it but does seem to wake up more often when he has it  on.  Patient denies loss of interest in usual activities and is able to enjoy things.  Denies decreased energy or motivation.  Appetite has not changed.  No extreme sadness, tearfulness, or feelings of hopelessness.  Denies any changes in concentration, making decisions or remembering things.  Denies suicidal or homicidal thoughts.  Patient denies increased energy with decreased need for sleep, no increased talkativeness, no racing thoughts, no impulsivity or risky behaviors, no increased spending, no increased libido, no grandiosity, no increased irritability or anger, and no hallucinations.  Denies dizziness, syncope, seizures, numbness, tingling, tremor, tics, unsteady gait, slurred speech, confusion. Denies muscle or joint pain, stiffness, or dystonia.  Individual Medical History/ Review of Systems: Changes? 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  atorvastatin (LIPITOR) 80 MG tablet, TAKE 1 TABLET BY MOUTH EVERY DAY, Disp: 90 tablet, Rfl: 0 .  B Complex Vitamins (B-COMPLEX/B-12 PO), Take by mouth., Disp: , Rfl:  .  blood glucose meter kit and supplies KIT, Dispense based on patient and insurance preference. Use up to four times daily as directed. (FOR ICD-9 250.00, 250.01)., Disp: 1 each,  Rfl: 0 .  Blood Glucose Monitoring Suppl (ONE TOUCH ULTRA SYSTEM KIT) W/DEVICE KIT, 1 kit by Does not apply route once., Disp: 1 each, Rfl: 0 .  cholecalciferol (VITAMIN D3) 25 MCG (1000 UT) tablet, Take 2,000 Units by mouth daily., Disp: , Rfl:  .  clotrimazole (LOTRIMIN) 1 % cream, Apply 1 application topically 2 (two)  times daily., Disp: 30 g, Rfl: 0 .  Dapagliflozin-metFORMIN HCl ER (XIGDUO XR) 07-998 MG TB24, Take 2 tablets by mouth daily., Disp: 180 tablet, Rfl: 1 .  esomeprazole (NEXIUM) 40 MG capsule, Take 1 capsule (40 mg total) by mouth daily., Disp: 90 capsule, Rfl: 3 .  ferrous sulfate 325 (65 FE) MG tablet, Take 65 mg by mouth daily with breakfast., Disp: , Rfl:  .  fish oil-omega-3 fatty acids 1000 MG capsule, Take 2 g by mouth daily., Disp: , Rfl:  .  Ginkgo Biloba Extract (GNP GINGKO BILOBA EXTRACT) 60 MG CAPS, Take by mouth., Disp: , Rfl:  .  glipiZIDE (GLUCOTROL) 5 MG tablet, Take 1 tablet (5 mg total) by mouth 2 (two) times daily before a meal., Disp: 180 tablet, Rfl: 1 .  glucose blood (ONE TOUCH ULTRA TEST) test strip, 1 each by Other route daily., Disp: 100 each, Rfl: 8 .  lamoTRIgine (LAMICTAL) 200 MG tablet, Take 1 tablet (200 mg total) by mouth 2 (two) times daily., Disp: 180 tablet, Rfl: 1 .  Lancets (ONETOUCH ULTRASOFT) lancets, USE AS INSTRUCTED, Disp: 100 each, Rfl: 0 .  levothyroxine (SYNTHROID) 175 MCG tablet, TAKE 1 TABLET (175 MCG TOTAL) BY MOUTH DAILY BEFORE BREAKFAST., Disp: 90 tablet, Rfl: 0 .  lisinopril (ZESTRIL) 5 MG tablet, Take 1 tablet (5 mg total) by mouth daily., Disp: 90 tablet, Rfl: 1 .  PARoxetine (PAXIL) 30 MG tablet, TAKE 2 TABLETS BY MOUTH EVERY DAY, Disp: 180 tablet, Rfl: 1 .  triamcinolone cream (KENALOG) 0.5 %, APPLY TO AFFECTED AREA TWICE A DAY, Disp: , Rfl:  .  busPIRone (BUSPAR) 30 MG tablet, Take 1 tablet (30 mg total) by mouth 2 (two) times daily., Disp: 180 tablet, Rfl: 1 Medication Side Effects: none  Family Medical/ Social History: Changes? No  MENTAL HEALTH EXAM:  There were no vitals taken for this visit.There is no height or weight on file to calculate BMI.  General Appearance: unable to assess  Eye Contact:  Unable to assess  Speech:  Clear and Coherent and Normal Rate  Volume:  Normal  Mood:  Euthymic  Affect:  Unable to assess  Thought  Process:  Goal Directed and Descriptions of Associations: Intact  Orientation:  Full (Time, Place, and Person)  Thought Content: Logical   Suicidal Thoughts:  No  Homicidal Thoughts:  No  Memory:  WNL  Judgement:  Good  Insight:  Good  Psychomotor Activity:  Unable to assess  Concentration:  Concentration: Good  Recall:  Good  Fund of Knowledge: Good  Language: Good  Assets:  Desire for Improvement  ADL's:  Intact  Cognition: WNL  Prognosis:  Good    DIAGNOSES:    ICD-10-CM   1. Recurrent major depressive disorder, in partial remission ([place])  F33.41   2. Generalized anxiety disorder  F41.1   3. Smoker  F17.200   4. Obstructive sleep apnea  G47.33     Receiving Psychotherapy: No    RECOMMENDATIONS:  PDMP was reviewed. I spent 20 minutes with him. It is fine that he increase the BuSpar but I would appreciate it if you call me  in the future if there are problems that we need to address.  Continue BuSpar 30 mg, 1 p.o. twice daily.  (He is aware that I am changing the dosage of each pill) Continue Lamictal 200 mg, 1 p.o. twice daily. Continue Paxil 30 mg, 2 p.o. daily. Continue multivitamin, ginkgo biloba, B complex, fish oil, vitamin D. Continue using CPAP. Return in 6 months.  Donnal Moat, PA-C

## 2019-07-26 ENCOUNTER — Ambulatory Visit: Payer: BC Managed Care – PPO | Admitting: Family Medicine

## 2019-07-30 ENCOUNTER — Telehealth: Payer: Self-pay | Admitting: Physician Assistant

## 2019-07-30 NOTE — Telephone Encounter (Signed)
I doubt it's the Buspar b/c he's been on this for months.  F/u w/ PCP

## 2019-07-30 NOTE — Telephone Encounter (Signed)
Pt is requesting a call back from either T. Hurst or the nurse to go over medications.  Call back # [phone]

## 2019-08-02 ENCOUNTER — Encounter: Payer: Self-pay | Admitting: Family Medicine

## 2019-08-02 NOTE — Telephone Encounter (Signed)
Left message and advised to follow up with his PCP if symptoms continued or worsened.

## 2019-08-19 ENCOUNTER — Other Ambulatory Visit: Payer: Self-pay | Admitting: Family Medicine

## 2019-08-19 ENCOUNTER — Ambulatory Visit: Payer: Self-pay | Admitting: Adult Health

## 2019-08-19 DIAGNOSIS — E1165 Type 2 diabetes mellitus with hyperglycemia: Secondary | ICD-10-CM

## 2019-08-27 ENCOUNTER — Other Ambulatory Visit: Payer: Self-pay | Admitting: Family Medicine

## 2019-08-27 DIAGNOSIS — E1165 Type 2 diabetes mellitus with hyperglycemia: Secondary | ICD-10-CM

## 2019-08-27 NOTE — Telephone Encounter (Signed)
Requested Prescriptions  Pending Prescriptions Disp Refills  . XIGDUO XR 07-998 MG TB24 [Pharmacy Med Name: XIGDUO XR 5 MG-1,000 MG TABLET] 180 tablet 1    Sig: TAKE 2 TABLETS BY MOUTH EVERY DAY     Endocrinology:  Diabetes - Biguanide + SGLT2 Inhibitor Combos Failed - 08/27/2019  1:05 AM      Failed - LDL in normal range and within 360 days    LDL Chol Calc (NIH)  Date Value Ref Range Status  04/26/2019 90 0 - 99 mg/dL Final         Failed - HBA1C is between 0 and 7.9 and within 180 days    Hgb A1c MFr Bld  Date Value Ref Range Status  02/18/2019 8.3 (H) 4.8 - 5.6 % Final    Comment:             Prediabetes: 5.7 - 6.4          Diabetes: >6.4          Glycemic control for adults with diabetes: <7.0          Passed - Cr in normal range and within 360 days    Creat  Date Value Ref Range Status  12/29/2015 0.72 0.70 - 1.33 mg/dL Final    Comment:      For patients > or = 59 years of age: The upper reference limit for Creatinine is approximately 13% higher for people identified as African-American.      Creatinine, Ser  Date Value Ref Range Status  04/26/2019 0.81 0.76 - 1.27 mg/dL Final   Creatinine, Urine  Date Value Ref Range Status  08/01/2014 381.5 mg/dL Final    Comment:    Result repeated and verified. Result confirmed by automatic dilution. No reference range established.          Passed - eGFR in normal range and within 360 days    GFR, Est African American  Date Value Ref Range Status  07/28/2015 >89 >=60 mL/min Final   GFR calc Af Amer  Date Value Ref Range Status  04/26/2019 113 >59 mL/min/1.73 Final   GFR, Est Non African American  Date Value Ref Range Status  07/28/2015 >89 >=60 mL/min Final    Comment:      The estimated GFR is a calculation valid for adults (>=11 years old) that uses the CKD-EPI algorithm to adjust for age and sex. It is   not to be used for children, pregnant women, hospitalized patients,    patients on dialysis, or with  rapidly changing kidney function. According to the NKDEP, eGFR >89 is normal, 60-89 shows mild impairment, 30-59 shows moderate impairment, 15-29 shows severe impairment and <15 is ESRD.      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

## 2019-09-03 ENCOUNTER — Other Ambulatory Visit: Payer: Self-pay | Admitting: Family Medicine

## 2019-09-03 DIAGNOSIS — E039 Hypothyroidism, unspecified: Secondary | ICD-10-CM

## 2019-09-03 NOTE — Telephone Encounter (Signed)
No further refills without office visit 

## 2019-09-11 DIAGNOSIS — B354 Tinea corporis: Secondary | ICD-10-CM | POA: Diagnosis not present

## 2019-10-17 DIAGNOSIS — H01003 Unspecified blepharitis right eye, unspecified eyelid: Secondary | ICD-10-CM | POA: Diagnosis not present

## 2019-10-17 DIAGNOSIS — R21 Rash and other nonspecific skin eruption: Secondary | ICD-10-CM | POA: Diagnosis not present

## 2019-10-24 ENCOUNTER — Other Ambulatory Visit: Payer: Self-pay | Admitting: Family Medicine

## 2019-10-24 DIAGNOSIS — E1165 Type 2 diabetes mellitus with hyperglycemia: Secondary | ICD-10-CM

## 2019-10-24 NOTE — Telephone Encounter (Signed)
Requested medication (s) are due for refill today: yes  Requested medication (s) are on the active medication list: yes  Last refill:  04/26/19  Future visit scheduled: no  Notes to clinic:  overdue for labs, needs appt   Requested Prescriptions  Pending Prescriptions Disp Refills   glipiZIDE (GLUCOTROL) 5 MG tablet [Pharmacy Med Name: GLIPIZIDE 5 MG TABLET] 180 tablet 1    Sig: Take 1 tablet (5 mg total) by mouth 2 (two) times daily before a meal.      Endocrinology:  Diabetes - Sulfonylureas Failed - 10/24/2019  9:09 AM      Failed - HBA1C is between 0 and 7.9 and within 180 days    Hgb A1c MFr Bld  Date Value Ref Range Status  02/18/2019 8.3 (H) 4.8 - 5.6 % Final    Comment:             Prediabetes: 5.7 - 6.4          Diabetes: >6.4          Glycemic control for adults with diabetes: <7.0           Failed - Valid encounter within last 6 months    Recent Outpatient Visits           6 months ago Type 2 diabetes mellitus with hyperglycemia, without long-term current use of insulin (HCC)   [facility], Asencion Partridge, MD   8 months ago Type 2 diabetes mellitus with hyperglycemia, without long-term current use of insulin [facility])   [facility], Asencion Partridge, MD   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

## 2019-10-25 NOTE — Telephone Encounter (Signed)
Pls schedule 6 month f/u for refills

## 2019-10-25 NOTE — Telephone Encounter (Signed)
No further refills without office visit 

## 2019-10-25 NOTE — Telephone Encounter (Signed)
Called LVMTCB On 10/25/19@2 :54 PM

## 2019-11-01 ENCOUNTER — Other Ambulatory Visit: Payer: Self-pay

## 2019-11-01 ENCOUNTER — Encounter: Payer: Self-pay | Admitting: Family Medicine

## 2019-11-01 ENCOUNTER — Telehealth (INDEPENDENT_AMBULATORY_CARE_PROVIDER_SITE_OTHER): Payer: BC Managed Care – PPO | Admitting: Family Medicine

## 2019-11-01 DIAGNOSIS — E039 Hypothyroidism, unspecified: Secondary | ICD-10-CM | POA: Diagnosis not present

## 2019-11-01 DIAGNOSIS — E1165 Type 2 diabetes mellitus with hyperglycemia: Secondary | ICD-10-CM

## 2019-11-01 DIAGNOSIS — E782 Mixed hyperlipidemia: Secondary | ICD-10-CM | POA: Diagnosis not present

## 2019-11-01 MED ORDER — ATORVASTATIN CALCIUM 80 MG PO TABS: 80.0000 mg | ORAL_TABLET | Freq: Every day | ORAL | 0 refills | Status: DC

## 2019-11-01 MED ORDER — GLIPIZIDE 5 MG PO TABS: 5.0000 mg | ORAL_TABLET | Freq: Two times a day (BID) | ORAL | 0 refills | Status: DC

## 2019-11-01 MED ORDER — LEVOTHYROXINE SODIUM 175 MCG PO TABS: 175.0000 ug | ORAL_TABLET | Freq: Every day | ORAL | 0 refills | Status: DC

## 2019-11-01 NOTE — Progress Notes (Signed)
Virtual Visit via audio Note  I connected with Robert Wise on 11/01/19 at 2:10 PM by audio - unable to connect by video and verified that I am speaking with the correct person using two identifiers.  Patient location: in his vehicle.  My location: office.    I discussed the limitations, risks, security and privacy concerns of performing an evaluation and management service by telephone and the availability of in person appointments. I also discussed with the patient that there may be a patient responsible charge related to this service. The patient expressed understanding and agreed to proceed, consent obtained  Chief complaint:  Chief Complaint  Patient presents with  . Medication Refill    Pt would like a refill on his throid medication, hyperlipidemia medication, and hypertension medication. pt reports taking his medication as prescribed with no known side effects.     History of Present Illness: Robert Wise is a 59 y.o. male  Diabetes: Complicated by hyperglycemia Xigduo 12/1998 total per day, glipizide 30m BID in January.  Planned lab visit after last OV - has not had recent testing.  Microalbumin: elevated ratio 38 in 01/2019.  On statin (lipitor 816mqd) and ACE-I Optho, foot exam, pneumovax:  Due for foot exam  Home readings: 180-200 fasting.  No missed doses.  No symptomatic lows.   Constitutional: Negative for fatigue and unexpected weight change.  Eyes: Negative for visual disturbance.  Respiratory: Negative for cough, chest tightness and shortness of breath.   Cardiovascular: Negative for chest pain, palpitations and leg swelling.  Gastrointestinal: Negative for abdominal pain and blood in stool.  Neurological: Negative for dizziness, light-headedness and headaches.    covid vaccine: received in March.   Lab Results  Component Value Date   HGBA1C 8.3 (H) 02/18/2019   HGBA1C 7.8 (A) 04/17/2018   HGBA1C 14.0 (A) 02/10/2018   Lab Results  Component  Value Date   MICROALBUR 2.1 (H) 08/01/2014   LDLCALC 90 04/26/2019   CREATININE 0.81 04/26/2019   Hyperlipidemia: lipitor 809md, no new myalgias or side effects.  Lab Results  Component Value Date   CHOL 148 04/26/2019   HDL 31 (L) 04/26/2019   LDLCALC 90 04/26/2019   TRIG 151 (H) 04/26/2019   CHOLHDL 4.8 04/26/2019   Lab Results  Component Value Date   ALT 16 04/26/2019   AST 17 04/26/2019   ALKPHOS 75 04/26/2019   BILITOT 0.3 04/26/2019   Hypothyroidism:  Lab Results  Component Value Date   TSH 5.360 (H) 04/26/2019  Taking medication daily. Synthroid 175m58md. Borderline level prior - no med changes.  No new hot or cold intolerance. No new hair or skin changes, heart palpitations or new fatigue. No new weight changes.      Patient Active Problem List   Diagnosis Date Noted  . Anxiety state 01/25/2018  . Fatigue 01/25/2018  . Uncontrolled type 2 diabetes mellitus with complication, without long-term current use of insulin (HCC)[place]/01/2015  . Chronic pain 12/01/2014  . Depression 10/12/2014   Past Medical History:  Diagnosis Date  . Anxiety   . Depression   . Diabetes mellitus without complication (HCC)[place]. Hyperlipidemia   . OSA (obstructive sleep apnea) 12/15/2012   Past Surgical History:  Procedure Laterality Date  . ANKLE SURGERY Left 2006  . VASECTOMY     Allergies  Allergen Reactions  . Tricor [Fenofibrate]   . Zocor [Simvastatin]    Prior to Admission medications   Medication Sig Start  Date End Date Taking? Authorizing Provider  atorvastatin (LIPITOR) 80 MG tablet TAKE 1 TABLET BY MOUTH EVERY DAY 06/06/19  Yes [name], MD  B Complex Vitamins (B-COMPLEX/B-12 PO) Take by mouth.   Yes [provider]  blood glucose meter kit and supplies KIT Dispense based on patient and insurance preference. Use up to four times daily as directed. (FOR ICD-9 250.00, 250.01). 01/07/19  Yes Stallings, Zoe A, MD  Blood Glucose Monitoring Suppl (ONE  TOUCH ULTRA SYSTEM KIT) W/DEVICE KIT 1 kit by Does not apply route once. 10/25/11  Yes Weber, Sarah L, PA-C  busPIRone (BUSPAR) 30 MG tablet Take 1 tablet (30 mg total) by mouth 2 (two) times daily. 07/05/19  Yes Hurst, Dorothea Glassman, PA-C  cholecalciferol (VITAMIN D3) 25 MCG (1000 UT) tablet Take 2,000 Units by mouth daily.   Yes [provider]  clotrimazole (LOTRIMIN) 1 % cream Apply 1 application topically 2 (two) times daily. 06/21/19  Yes [name], MD  esomeprazole (NEXIUM) 40 MG capsule Take 1 capsule (40 mg total) by mouth daily. 04/11/17  Yes Jaynee Eagles, PA-C  ferrous sulfate 325 (65 FE) MG tablet Take 65 mg by mouth daily with breakfast.   Yes [provider]  fish oil-omega-3 fatty acids 1000 MG capsule Take 2 g by mouth daily.   Yes [provider]  Ginkgo Biloba Extract (GNP GINGKO BILOBA EXTRACT) 60 MG CAPS Take by mouth.   Yes [provider]  glipiZIDE (GLUCOTROL) 5 MG tablet TAKE 1 TABLET (5 MG TOTAL) BY MOUTH 2 (TWO) TIMES DAILY BEFORE A MEAL. 10/25/19  Yes [name], MD  glucose blood (ONE TOUCH ULTRA TEST) test strip 1 each by Other route daily. 12/01/14  Yes Jaynee Eagles, PA-C  lamoTRIgine (LAMICTAL) 200 MG tablet Take 1 tablet (200 mg total) by mouth 2 (two) times daily. 07/05/19  Yes Hurst, Helene Kelp T, PA-C  Lancets [facility] ULTRASOFT) lancets USE AS INSTRUCTED 11/28/13  Yes [name], MD  levothyroxine (SYNTHROID) 175 MCG tablet TAKE 1 TABLET (175 MCG TOTAL) BY MOUTH DAILY BEFORE BREAKFAST. 09/03/19  Yes [name], MD  lisinopril (ZESTRIL) 5 MG tablet TAKE 1 TABLET BY MOUTH EVERY DAY 08/19/19  Yes [name], MD  PARoxetine (PAXIL) 30 MG tablet TAKE 2 TABLETS BY MOUTH EVERY DAY 07/05/19  Yes Hurst, Teresa T, PA-C  triamcinolone cream (KENALOG) 0.5 % APPLY TO AFFECTED AREA TWICE A DAY 04/18/19  Yes [provider]  XIGDUO XR 07-998 MG TB24 TAKE 2 TABLETS BY MOUTH EVERY DAY 08/27/19  Yes [name], MD   Social  History   Socioeconomic History  . Marital status: Married    Spouse name: [name]  . Number of children: 3  . Years of education: 11.5  . Highest education level: Not on file  Occupational History    Employer: [employer]    Comment: Driver  Tobacco Use  . Smoking status: Former Smoker    Packs/day: 1.00    Years: 37.00    Pack years: 37.00    Types: Cigarettes    Start date: 04/02/2019  . Smokeless tobacco: Never Used  Substance and Sexual Activity  . Alcohol use: No    Comment: quit: 08/14/2011  . Drug use: No  . Sexual activity: Yes    Birth control/protection: None  Other Topics Concern  . Not on file  Social History Narrative   Patient lives at home with family.    Caffeine Use: 1 pot  of coffee daily   Social Determinants of Health   Financial Resource Strain:   . Difficulty of Paying Living Expenses:   Food Insecurity:   . Worried About Charity fundraiser in the Last Year:   . Arboriculturist in the Last Year:   Transportation Needs:   . Film/video editor (Medical):   Marland Kitchen Lack of Transportation (Non-Medical):   Physical Activity:   . Days of Exercise per Week:   . Minutes of Exercise per Session:   Stress:   . Feeling of Stress :   Social Connections:   . Frequency of Communication with Friends and Family:   . Frequency of Social Gatherings with Friends and Family:   . Attends Religious Services:   . Active Member of Clubs or Organizations:   . Attends Archivist Meetings:   Marland Kitchen Marital Status:   Intimate Partner Violence:   . Fear of Current or Ex-Partner:   . Emotionally Abused:   Marland Kitchen Physically Abused:   . Sexually Abused:     Observations/Objective: Vitals:   11/01/19 1117  Weight: 224 lb (101.6 kg)  Height: 6' (1.829 m)     Assessment and Plan: Mixed hyperlipidemia - Plan: atorvastatin (LIPITOR) 80 MG tablet  -  Stable, tolerating current regimen. Medications refilled. Labs pending at lab only visit.  Type 2 diabetes mellitus  with hyperglycemia, without long-term current use of insulin (HCC) - Plan: glipiZIDE (GLUCOTROL) 5 MG tablet  -  Stable, tolerating current regimen. Lab only visit, no med changes for now. Check labs, 3 month follow up.  Hypothyroidism, unspecified type - Plan: levothyroxine (SYNTHROID) 175 MCG tablet  - borderline tsh prior - no new symptoms. Continue same regimen. Lab visit.   Follow Up Instructions: 1 week - lab, 3 months with me.    I discussed the assessment and treatment plan with the patient. The patient was provided an opportunity to ask questions and all were answered. The patient agreed with the plan and demonstrated an understanding of the instructions.   The patient was advised to call back or seek an in-person evaluation if the symptoms worsen or if the condition fails to improve as anticipated.  I provided 11 minutes of non-face-to-face time during this encounter.   [name], MD

## 2019-11-01 NOTE — Patient Instructions (Signed)
° ° ° °  If you have lab work done today you will be contacted with your lab results within the next 2 weeks.  If you have not heard from us then please contact us. The fastest way to get your results is to register for My Chart. ° ° °IF you received an x-ray today, you will receive an invoice from [facility]. Please contact [facility] at [phone] with questions or concerns regarding your invoice.  ° °IF you received labwork today, you will receive an invoice from [facility]. Please contact [facility] at [phone] with questions or concerns regarding your invoice.  ° °Our billing staff will not be able to assist you with questions regarding bills from these companies. ° °You will be contacted with the lab results as soon as they are available. The fastest way to get your results is to activate your My Chart account. Instructions are located on the last page of this paperwork. If you have not heard from us regarding the results in 2 weeks, please contact this office. °  ° ° ° °

## 2019-11-02 ENCOUNTER — Telehealth: Payer: Self-pay | Admitting: Family Medicine

## 2019-11-02 NOTE — Telephone Encounter (Addendum)
11/02/2019 - PATIENT HAD A MY-CHART APPOINTMENT WITH DR. Neva Seat ON Monday (11/01/2019). DR. Neva Seat HAS REQUESTED PATIENT RETURN IN 1 WEEK TO HAVE LABS DRAWN. THE LAB ORDER IS IN. HE ALSO WANTS PATIENT TO HAVE A 3 MONTH FOLLOW-UP WITH HIM IN NOV. 2021. I CALLED TO SCHEDULE BUT HAD TO LEAVE A VOICE MAIL FOR HIM TO RETURN MY CALL. MBC

## 2019-11-03 ENCOUNTER — Encounter: Payer: Self-pay | Admitting: Adult Health

## 2019-11-08 ENCOUNTER — Encounter: Payer: Self-pay | Admitting: Adult Health

## 2019-11-08 ENCOUNTER — Encounter: Payer: Self-pay | Admitting: Family Medicine

## 2019-11-08 ENCOUNTER — Ambulatory Visit (INDEPENDENT_AMBULATORY_CARE_PROVIDER_SITE_OTHER): Payer: BC Managed Care – PPO | Admitting: Family Medicine

## 2019-11-08 ENCOUNTER — Other Ambulatory Visit: Payer: Self-pay

## 2019-11-08 ENCOUNTER — Ambulatory Visit: Payer: BC Managed Care – PPO | Admitting: Adult Health

## 2019-11-08 VITALS — BP 150/79 | HR 73 | Ht 72.0 in | Wt 235.4 lb

## 2019-11-08 DIAGNOSIS — Z9989 Dependence on other enabling machines and devices: Secondary | ICD-10-CM | POA: Diagnosis not present

## 2019-11-08 DIAGNOSIS — E1165 Type 2 diabetes mellitus with hyperglycemia: Secondary | ICD-10-CM | POA: Diagnosis not present

## 2019-11-08 DIAGNOSIS — G4733 Obstructive sleep apnea (adult) (pediatric): Secondary | ICD-10-CM

## 2019-11-08 DIAGNOSIS — E039 Hypothyroidism, unspecified: Secondary | ICD-10-CM | POA: Diagnosis not present

## 2019-11-08 DIAGNOSIS — E782 Mixed hyperlipidemia: Secondary | ICD-10-CM | POA: Diagnosis not present

## 2019-11-08 NOTE — Addendum Note (Signed)
Addended by: Enedina Finner on: 11/08/2019 08:20 AM   Modules accepted: Orders

## 2019-11-08 NOTE — Progress Notes (Signed)
Lab only visit 

## 2019-11-08 NOTE — Progress Notes (Signed)
PATIENT: Robert Wise DOB: [birth year]  REASON FOR VISIT: follow up HISTORY FROM: patient  HISTORY OF PRESENT ILLNESS: Today 11/08/19:  Robert Wise is a 59 year old male with a history of obstructive sleep apnea on CPAP. He returns today for follow-up. His download indicates that he use his machine 20 studies for compliance of 87%. He uses machine greater than 4 hours 24 days for complete percent. On average he uses his machine 6 hours and 12 minutes. His residual AHI is 3.6 on 14 cm of water with EPR of 1. Leak in the 95th percentile is 84.9 L/min. He reports that he has not changed his mask out recently. He does have a fullface mask. 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  HISTORY 08/19/18:  Robert Wise is a 59 year old male with a history of obstructive sleep apnea on CPAP.  He returns today for a virtual visit.  His download indicates that he uses machine 27 out of 30 days for compliance of 90%.  He uses machine greater than 4 hours 21 out of 30 days for compliance of 70%.  On average he uses his machine 5 hours and 12 minutes.  His residual AHI is 3.2 on 14 cm of water with EPR of 1.  He does not have a significant leak.  He states that the CPAP continues to work well for him.  He denies any significant daytime sleepiness.  He returns today for a virtual visit.  REVIEW OF SYSTEMS: Out of a complete 14 system review of symptoms, the patient complains only of the following symptoms, and all other reviewed systems are negative.  See HPI  ALLERGIES: Allergies  Allergen Reactions  . Tricor [Fenofibrate]   . Zocor [Simvastatin]     HOME MEDICATIONS: Outpatient Medications Prior to Visit  Medication Sig Dispense Refill  . atorvastatin (LIPITOR) 80 MG tablet Take 1 tablet (80 mg total) by mouth daily. 90 tablet 0  . B Complex Vitamins (B-COMPLEX/B-12 PO) Take by mouth.    . blood glucose meter kit and supplies KIT Dispense based on patient and insurance  preference. Use up to four times daily as directed. (FOR ICD-9 250.00, 250.01). 1 each 0  . Blood Glucose Monitoring Suppl (ONE TOUCH ULTRA SYSTEM KIT) W/DEVICE KIT 1 kit by Does not apply route once. 1 each 0  . busPIRone (BUSPAR) 30 MG tablet Take 1 tablet (30 mg total) by mouth 2 (two) times daily. 180 tablet 1  . cholecalciferol (VITAMIN D3) 25 MCG (1000 UT) tablet Take 2,000 Units by mouth daily.    . clotrimazole (LOTRIMIN) 1 % cream Apply 1 application topically 2 (two) times daily. 30 g 0  . esomeprazole (NEXIUM) 40 MG capsule Take 1 capsule (40 mg total) by mouth daily. 90 capsule 3  . ferrous sulfate 325 (65 FE) MG tablet Take 65 mg by mouth daily with breakfast.    . fish oil-omega-3 fatty acids 1000 MG capsule Take 2 g by mouth daily.    . Ginkgo Biloba Extract (GNP GINGKO BILOBA EXTRACT) 60 MG CAPS Take by mouth.    Marland Kitchen glipiZIDE (GLUCOTROL) 5 MG tablet Take 1 tablet (5 mg total) by mouth 2 (two) times daily before a meal. 60 tablet 0  . glucose blood (ONE TOUCH ULTRA TEST) test strip 1 each by Other route daily. 100 each 8  . lamoTRIgine (LAMICTAL) 200 MG tablet Take 1 tablet (200 mg total) by mouth 2 (two) times daily.  180 tablet 1  . Lancets (ONETOUCH ULTRASOFT) lancets USE AS INSTRUCTED 100 each 0  . levothyroxine (SYNTHROID) 175 MCG tablet Take 1 tablet (175 mcg total) by mouth daily before breakfast. 90 tablet 0  . lisinopril (ZESTRIL) 5 MG tablet TAKE 1 TABLET BY MOUTH EVERY DAY 90 tablet 1  . PARoxetine (PAXIL) 30 MG tablet TAKE 2 TABLETS BY MOUTH EVERY DAY 180 tablet 1  . triamcinolone cream (KENALOG) 0.5 % APPLY TO AFFECTED AREA TWICE A DAY    . XIGDUO XR 07-998 MG TB24 TAKE 2 TABLETS BY MOUTH EVERY DAY 180 tablet 1   No facility-administered medications prior to visit.    PAST MEDICAL HISTORY: Past Medical History:  Diagnosis Date  . Anxiety   . Depression   . Diabetes mellitus without complication ([place])   . Hyperlipidemia   . OSA (obstructive sleep apnea) 12/15/2012     PAST SURGICAL HISTORY: Past Surgical History:  Procedure Laterality Date  . ANKLE SURGERY Left 2006  . VASECTOMY      FAMILY HISTORY: No family history on file.  SOCIAL HISTORY: Social History   Socioeconomic History  . Marital status: Married    Spouse name: [name]  . Number of children: 3  . Years of education: 11.5  . Highest education level: Not on file  Occupational History    Employer: [employer]    Comment: Driver  Tobacco Use  . Smoking status: Former Smoker    Packs/day: 1.00    Years: 37.00    Pack years: 37.00    Types: Cigarettes    Start date: 04/02/2019  . Smokeless tobacco: Never Used  Substance and Sexual Activity  . Alcohol use: No    Comment: quit: 08/14/2011  . Drug use: No  . Sexual activity: Yes    Birth control/protection: None  Other Topics Concern  . Not on file  Social History Narrative   Patient lives at home with family.    Caffeine Use: 1 pot of coffee daily   Social Determinants of Health   Financial Resource Strain:   . Difficulty of Paying Living Expenses:   Food Insecurity:   . Worried About Charity fundraiser in the Last Year:   . Arboriculturist in the Last Year:   Transportation Needs:   . Film/video editor (Medical):   Marland Kitchen Lack of Transportation (Non-Medical):   Physical Activity:   . Days of Exercise per Week:   . Minutes of Exercise per Session:   Stress:   . Feeling of Stress :   Social Connections:   . Frequency of Communication with Friends and Family:   . Frequency of Social Gatherings with Friends and Family:   . Attends Religious Services:   . Active Member of Clubs or Organizations:   . Attends Archivist Meetings:   Marland Kitchen Marital Status:   Intimate Partner Violence:   . Fear of Current or Ex-Partner:   . Emotionally Abused:   Marland Kitchen Physically Abused:   . Sexually Abused:       PHYSICAL EXAM  Vitals:   11/08/19 0743  BP: (!) 150/79  Pulse: 73  Weight: 235 lb 6.4 oz (106.8 kg)    Height: 6' (1.829 m)   Body mass index is 31.93 kg/m.  Generalized: Well developed, in no acute distress  Chest: Lungs clear to auscultation bilaterally  Neurological examination  Mentation: Alert oriented to time, place, history taking. Follows all commands speech and language fluent Cranial nerve  II-XII: Extraocular movements were full, visual field were full on confrontational test Head turning and shoulder shrug  were normal and symmetric. Motor: The motor testing reveals 5 over 5 strength of all 4 extremities. Good symmetric motor tone is noted throughout.  Sensory: Sensory testing is intact to soft touch on all 4 extremities. No evidence of extinction is noted.  Gait and station: Gait is normal.    DIAGNOSTIC DATA (LABS, IMAGING, TESTING) - I reviewed patient records, labs, notes, testing and imaging myself where available.  Lab Results  Component Value Date   WBC 9.8 06/02/2017   HGB 14.7 06/02/2017   HCT 42.6 06/02/2017   MCV 81 06/02/2017   PLT 253 06/02/2017      Component Value Date/Time   NA 142 04/26/2019 0848   K 4.6 04/26/2019 0848   CL 103 04/26/2019 0848   CO2 21 04/26/2019 0848   GLUCOSE 123 (H) 04/26/2019 0848   GLUCOSE 136 (H) 12/29/2015 0909   BUN 17 04/26/2019 0848   CREATININE 0.81 04/26/2019 0848   CREATININE 0.72 12/29/2015 0909   CALCIUM 9.7 04/26/2019 0848   PROT 7.5 04/26/2019 0848   ALBUMIN 5.0 (H) 04/26/2019 0848   AST 17 04/26/2019 0848   ALT 16 04/26/2019 0848   ALKPHOS 75 04/26/2019 0848   BILITOT 0.3 04/26/2019 0848   GFRNONAA 98 04/26/2019 0848   GFRNONAA >89 07/28/2015 0847   GFRAA 113 04/26/2019 0848   GFRAA >89 07/28/2015 0847   Lab Results  Component Value Date   CHOL 148 04/26/2019   HDL 31 (L) 04/26/2019   LDLCALC 90 04/26/2019   TRIG 151 (H) 04/26/2019   CHOLHDL 4.8 04/26/2019   Lab Results  Component Value Date   HGBA1C 8.3 (H) 02/18/2019   Lab Results  Component Value Date   VITAMINB12 747 06/02/2017   Lab  Results  Component Value Date   TSH 5.360 (H) 04/26/2019      ASSESSMENT AND PLAN 59 y.o. year old male  has a past medical history of Anxiety, Depression, Diabetes mellitus without complication ([place]), Hyperlipidemia, and OSA (obstructive sleep apnea) (12/15/2012). here with:  1. Obstructive sleep apnea on CPAP  CPAP download indicates good compliance With treatment of apnea Leak is elevated-will send order for new supplies Follow-up in 1 year or sooner if needed    I spent 20 minutes of face-to-face and non-face-to-face time with patient.  This included previsit chart review, lab review, study review, order entry, electronic health record documentation, patient education.  Robert Givens, MSN, NP-C 11/08/2019, 7:55 AM [facility] [address], [place], [place] [address] [phone]

## 2019-11-09 LAB — TSH: TSH: 6.84 u[IU]/mL — ABNORMAL HIGH (ref 0.450–4.500)

## 2019-11-09 LAB — HEPATIC FUNCTION PANEL
ALT: 15 IU/L (ref 0–44)
AST: 14 IU/L (ref 0–40)
Albumin: 4.9 g/dL (ref 3.8–4.9)
Alkaline Phosphatase: 93 IU/L (ref 48–121)
Bilirubin Total: <0.2 mg/dL (ref 0.0–1.2)
Bilirubin, Direct: 0.09 mg/dL (ref 0.00–0.40)
Total Protein: 7.4 g/dL (ref 6.0–8.5)

## 2019-11-09 LAB — LIPID PANEL
Chol/HDL Ratio: 7.2 ratio — ABNORMAL HIGH (ref 0.0–5.0)
Cholesterol, Total: 186 mg/dL (ref 100–199)
HDL: 26 mg/dL — ABNORMAL LOW (ref >39)
LDL Chol Calc (NIH): 73 mg/dL (ref 0–99)
Triglycerides: 557 mg/dL (ref 0–149)
VLDL Cholesterol Cal: 87 mg/dL — ABNORMAL HIGH (ref 5–40)

## 2019-11-09 LAB — HEMOGLOBIN A1C
Est. average glucose Bld gHb Est-mCnc: 200 mg/dL
Hgb A1c MFr Bld: 8.6 % — ABNORMAL HIGH (ref 4.8–5.6)

## 2019-11-29 ENCOUNTER — Other Ambulatory Visit: Payer: Self-pay | Admitting: Family Medicine

## 2019-11-29 DIAGNOSIS — E1165 Type 2 diabetes mellitus with hyperglycemia: Secondary | ICD-10-CM

## 2019-12-13 ENCOUNTER — Ambulatory Visit: Payer: BC Managed Care – PPO | Admitting: Family Medicine

## 2019-12-20 ENCOUNTER — Encounter: Payer: Self-pay | Admitting: Family Medicine

## 2019-12-20 ENCOUNTER — Other Ambulatory Visit: Payer: Self-pay

## 2019-12-20 ENCOUNTER — Ambulatory Visit (INDEPENDENT_AMBULATORY_CARE_PROVIDER_SITE_OTHER): Payer: BC Managed Care – PPO | Admitting: Family Medicine

## 2019-12-20 VITALS — BP 138/80 | HR 77 | Temp 98.3°F | Ht 72.0 in | Wt 232.0 lb

## 2019-12-20 DIAGNOSIS — Z23 Encounter for immunization: Secondary | ICD-10-CM

## 2019-12-20 DIAGNOSIS — E1165 Type 2 diabetes mellitus with hyperglycemia: Secondary | ICD-10-CM

## 2019-12-20 DIAGNOSIS — E782 Mixed hyperlipidemia: Secondary | ICD-10-CM

## 2019-12-20 DIAGNOSIS — E039 Hypothyroidism, unspecified: Secondary | ICD-10-CM

## 2019-12-20 DIAGNOSIS — E781 Pure hyperglyceridemia: Secondary | ICD-10-CM

## 2019-12-20 MED ORDER — GLIPIZIDE 10 MG PO TABS: 10.0000 mg | ORAL_TABLET | Freq: Two times a day (BID) | ORAL | 2 refills | Status: DC

## 2019-12-20 NOTE — Patient Instructions (Addendum)
I will check your labs again today to decide if additional medication needed for elevated triglycerides.  Increase glipizide to 10 mg twice per day.  No other med changes for now.   If you have lab work done today you will be contacted with your lab results within the next 2 weeks.  If you have not heard from [place] then please contact us. The fastest way to get your results is to register for My Chart.   IF you received an x-ray today, you will receive an invoice from [id number]. Please contact [facility] at [phone] with questions or concerns regarding your invoice.   IF you received labwork today, you will receive an invoice from [place]. Please contact [facility] at [phone] with questions or concerns regarding your invoice.   Our billing staff will not be able to assist you with questions regarding bills from these companies.  You will be contacted with the lab results as soon as they are available. The fastest way to get your results is to activate your My Chart account. Instructions are located on the last page of this paperwork. If you have not heard from [place] regarding the results in 2 weeks, please contact this office.

## 2019-12-20 NOTE — Progress Notes (Signed)
Subjective:  Patient ID: Robert Wise, male    DOB: [birth year]  Age: 59 y.o. MRN: [id number]  CC:  Chief Complaint  Patient presents with  . Follow-up    on diabetes,hyperlipidemia, and thyroid. due to recent elevated Blood work. PT reports no physical issues with these conditions since last OV.  Juluis Rainier    pt has brought in sleep study paper work for pt's up coming DOT physical.    HPI Robert Wise presents for   Diabetes:  Complicated by hyperglycemia, has been treated with Xigduo XR 12/1998/day, glipizide 5 mg twice daily.  He is on statin, and ACE inhibitor.  Discussed at August video visit.  Home readings running 180-200. Home readings around 200.  No symptomatic lows.   Diabetic Foot Exam - Simple   Simple Foot Form Visual Inspection No deformities, no ulcerations, no other skin breakdown bilaterally: Yes Sensation Testing Intact to touch and monofilament testing bilaterally: Yes Pulse Check Posterior Tibialis and Dorsalis pulse intact bilaterally: Yes Comments      Lab Results  Component Value Date   HGBA1C 8.6 (H) 11/08/2019   HGBA1C 8.3 (H) 02/18/2019   HGBA1C 7.8 (A) 04/17/2018   Lab Results  Component Value Date   MICROALBUR 2.1 (H) 08/01/2014   LDLCALC 73 11/08/2019   CREATININE 0.81 04/26/2019   Hypothyroidism: Lab Results  Component Value Date   TSH 6.840 (H) 11/08/2019  Borderline TSH in August.  Continued same regimen with close follow-up plan. Currently taking 175 mcg daily (since November 2020) No missed doses.  Taking medication daily.  No new hot or cold intolerance. No new hair or skin changes, heart palpitations or new fatigue. No new weight changes.   Hyperlipidemia: Takes Lipitor 80 mg daily.  Significant hypertriglyceridemia at August 9 visit. Reports fasting for 8 hours at that time.  Denies missed doses.no Drank sip of milk at 9am. No other food.  Lab Results  Component Value Date   CHOL 186 11/08/2019   HDL 26 (L)  11/08/2019   LDLCALC 73 11/08/2019   TRIG 557 (HH) 11/08/2019   CHOLHDL 7.2 (H) 11/08/2019   Lab Results  Component Value Date   ALT 15 11/08/2019   AST 14 11/08/2019   ALKPHOS 93 11/08/2019   BILITOT <0.2 11/08/2019      History Patient Active Problem List   Diagnosis Date Noted  . Anxiety state 01/25/2018  . Fatigue 01/25/2018  . Uncontrolled type 2 diabetes mellitus with complication, without long-term current use of insulin ([place]) 02/10/2015  . Chronic pain 12/01/2014  . Depression 10/12/2014   Past Medical History:  Diagnosis Date  . Anxiety   . Depression   . Diabetes mellitus without complication ([place])   . Hyperlipidemia   . OSA (obstructive sleep apnea) 12/15/2012   Past Surgical History:  Procedure Laterality Date  . ANKLE SURGERY Left 2006  . VASECTOMY     Allergies  Allergen Reactions  . Tricor [Fenofibrate]   . Zocor [Simvastatin]    Prior to Admission medications   Medication Sig Start Date End Date Taking? Authorizing Provider  atorvastatin (LIPITOR) 80 MG tablet Take 1 tablet (80 mg total) by mouth daily. 11/01/19  Yes [name], MD  B Complex Vitamins (B-COMPLEX/B-12 PO) Take by mouth.   Yes [provider]  blood glucose meter kit and supplies KIT Dispense based on patient and insurance preference. Use up to four times daily as directed. (FOR ICD-9 250.00, 250.01). 01/07/19  Yes Stallings,  Zoe A, MD  Blood Glucose Monitoring Suppl (ONE TOUCH ULTRA SYSTEM KIT) W/DEVICE KIT 1 kit by Does not apply route once. 10/25/11  Yes Weber, Sarah L, PA-C  busPIRone (BUSPAR) 30 MG tablet Take 1 tablet (30 mg total) by mouth 2 (two) times daily. 07/05/19  Yes Hurst, Dorothea Glassman, PA-C  cholecalciferol (VITAMIN D3) 25 MCG (1000 UT) tablet Take 2,000 Units by mouth daily.   Yes [provider]  clotrimazole (LOTRIMIN) 1 % cream Apply 1 application topically 2 (two) times daily. 06/21/19  Yes [name], MD  esomeprazole (NEXIUM) 40 MG capsule  Take 1 capsule (40 mg total) by mouth daily. 04/11/17  Yes Jaynee Eagles, PA-C  ferrous sulfate 325 (65 FE) MG tablet Take 65 mg by mouth daily with breakfast.   Yes [provider]  fish oil-omega-3 fatty acids 1000 MG capsule Take 2 g by mouth daily.   Yes [provider]  Ginkgo Biloba Extract (GNP GINGKO BILOBA EXTRACT) 60 MG CAPS Take by mouth.   Yes [provider]  glipiZIDE (GLUCOTROL) 5 MG tablet TAKE 1 TABLET (5 MG TOTAL) BY MOUTH 2 (TWO) TIMES DAILY BEFORE A MEAL. 11/29/19  Yes [name], MD  glucose blood (ONE TOUCH ULTRA TEST) test strip 1 each by Other route daily. 12/01/14  Yes Jaynee Eagles, PA-C  lamoTRIgine (LAMICTAL) 200 MG tablet Take 1 tablet (200 mg total) by mouth 2 (two) times daily. 07/05/19  Yes Donnal Moat T, PA-C  Lancets [facility] ULTRASOFT) lancets USE AS INSTRUCTED 11/28/13  Yes [name], MD  levothyroxine (SYNTHROID) 175 MCG tablet Take 1 tablet (175 mcg total) by mouth daily before breakfast. 11/01/19  Yes [name], MD  lisinopril (ZESTRIL) 5 MG tablet TAKE 1 TABLET BY MOUTH EVERY DAY 08/19/19  Yes [name], MD  PARoxetine (PAXIL) 30 MG tablet TAKE 2 TABLETS BY MOUTH EVERY DAY 07/05/19  Yes Hurst, Teresa T, PA-C  triamcinolone cream (KENALOG) 0.5 % APPLY TO AFFECTED AREA TWICE A DAY 04/18/19  Yes [provider]  XIGDUO XR 07-998 MG TB24 TAKE 2 TABLETS BY MOUTH EVERY DAY 08/27/19  Yes [name], MD   Social History   Socioeconomic History  . Marital status: Married    Spouse name: [name]  . Number of children: 3  . Years of education: 11.5  . Highest education level: Not on file  Occupational History    Employer: [employer]    Comment: Driver  Tobacco Use  . Smoking status: Current Every Day Smoker    Packs/day: 1.00    Years: 37.00    Pack years: 37.00    Types: Cigarettes    Start date: 04/02/2019  . Smokeless tobacco: Never Used  Substance and Sexual Activity  . Alcohol use: No     Comment: quit: 08/14/2011  . Drug use: No  . Sexual activity: Yes    Birth control/protection: None  Other Topics Concern  . Not on file  Social History Narrative   Patient lives at home with family.    Caffeine Use: 1 pot of coffee daily   Social Determinants of Health   Financial Resource Strain:   . Difficulty of Paying Living Expenses: Not on file  Food Insecurity:   . Worried About Charity fundraiser in the Last Year: Not on file  . Ran Out of Food in the Last Year: Not on file  Transportation Needs:   . Lack of Transportation (Medical): Not on file  .  Lack of Transportation (Non-Medical): Not on file  Physical Activity:   . Days of Exercise per Week: Not on file  . Minutes of Exercise per Session: Not on file  Stress:   . Feeling of Stress : Not on file  Social Connections:   . Frequency of Communication with Friends and Family: Not on file  . Frequency of Social Gatherings with Friends and Family: Not on file  . Attends Religious Services: Not on file  . Active Member of Clubs or Organizations: Not on file  . Attends Archivist Meetings: Not on file  . Marital Status: Not on file  Intimate Partner Violence:   . Fear of Current or Ex-Partner: Not on file  . Emotionally Abused: Not on file  . Physically Abused: Not on file  . Sexually Abused: Not on file    Review of Systems  Per hpi.  No abdominal pain, nausea, vomiting. Objective:   Vitals:   12/20/19 1128 12/20/19 1131  BP: (!) 142/76 138/80  Pulse: 77   Temp: 98.3 F (36.8 C)   TempSrc: Temporal   SpO2: 96%   Weight: 232 lb (105.2 kg)   Height: 6' (1.829 m)      Physical Exam Vitals reviewed.  Constitutional:      Appearance: He is well-developed.  HENT:     Head: Normocephalic and atraumatic.  Eyes:     Pupils: Pupils are equal, round, and reactive to light.  Neck:     Vascular: No carotid bruit or JVD.  Cardiovascular:     Rate and Rhythm: Normal rate and regular rhythm.      Heart sounds: Normal heart sounds. No murmur heard.   Pulmonary:     Effort: Pulmonary effort is normal.     Breath sounds: Normal breath sounds. No rales.  Skin:    General: Skin is warm and dry.  Neurological:     Mental Status: He is alert and oriented to person, place, and time.     Assessment & Plan:  Robert Wise is a 59 y.o. male . Type 2 diabetes mellitus with hyperglycemia, without long-term current use of insulin (HCC) - Plan: glipiZIDE (GLUCOTROL) 10 MG tablet  -Decreased control, will adjust glipizide to 10 mg twice daily, hypoglycemic precautions discussed.  Continue xigduo same dose for now.  Need for prophylactic vaccination and inoculation against influenza - Plan: Flu Vaccine QUAD 36+ mos IM  Hypothyroidism, unspecified type - Plan: TSH + free T4  -Repeat TSH with free T4, consider further adjustment in Synthroid.  Mixed hyperlipidemia - Plan: Lipid panel Hypertriglyceridemia  -Risks of significant hypertriglyceridemia discussed including possible pancreatitis, and signs/symptoms.  Asymptomatic at present.  Repeat lipid panel, then consider additional med as maxed out dose on statin.  Improved diabetes control should also help.  Meds ordered this encounter  Medications  . glipiZIDE (GLUCOTROL) 10 MG tablet    Sig: Take 1 tablet (10 mg total) by mouth 2 (two) times daily before a meal.    Dispense:  60 tablet    Refill:  2   Patient Instructions   I will check your labs again today to decide if additional medication needed for elevated triglycerides.  Increase glipizide to 10 mg twice per day.  No other med changes for now.   If you have lab work done today you will be contacted with your lab results within the next 2 weeks.  If you have not heard from [place] then please contact us. The  fastest way to get your results is to register for My Chart.   IF you received an x-ray today, you will receive an invoice from [facility] Rad[facility]. Please contact [facility]  Radiology at [phone] with questions or concerns regarding your invoice.   IF you received labwork today, you will receive an invoice from [facility]. Please contact [facility] at [phone] with questions or concerns regarding your invoice.   Our billing staff will not be able to assist you with questions regarding bills from these companies.  You will be contacted with the lab results as soon as they are available. The fastest way to get your results is to activate your My Chart account. Instructions are located on the last page of this paperwork. If you have not heard from [place] regarding the results in 2 weeks, please contact this office.         Signed, Merri Ray, MD [facility]

## 2019-12-21 ENCOUNTER — Encounter: Payer: Self-pay | Admitting: Family Medicine

## 2019-12-21 LAB — LIPID PANEL
Chol/HDL Ratio: 5.3 ratio — ABNORMAL HIGH (ref 0.0–5.0)
Cholesterol, Total: 139 mg/dL (ref 100–199)
HDL: 26 mg/dL — ABNORMAL LOW (ref >39)
LDL Chol Calc (NIH): 77 mg/dL (ref 0–99)
Triglycerides: 217 mg/dL — ABNORMAL HIGH (ref 0–149)
VLDL Cholesterol Cal: 36 mg/dL (ref 5–40)

## 2019-12-21 LAB — TSH+FREE T4
Free T4: 1.16 ng/dL (ref 0.82–1.77)
TSH: 1.33 u[IU]/mL (ref 0.450–4.500)

## 2020-01-02 ENCOUNTER — Other Ambulatory Visit: Payer: Self-pay | Admitting: Physician Assistant

## 2020-01-03 ENCOUNTER — Encounter: Payer: Self-pay | Admitting: Physician Assistant

## 2020-01-03 ENCOUNTER — Other Ambulatory Visit: Payer: Self-pay

## 2020-01-03 ENCOUNTER — Ambulatory Visit (INDEPENDENT_AMBULATORY_CARE_PROVIDER_SITE_OTHER): Payer: BC Managed Care – PPO | Admitting: Physician Assistant

## 2020-01-03 DIAGNOSIS — G4733 Obstructive sleep apnea (adult) (pediatric): Secondary | ICD-10-CM

## 2020-01-03 DIAGNOSIS — F411 Generalized anxiety disorder: Secondary | ICD-10-CM

## 2020-01-03 DIAGNOSIS — R454 Irritability and anger: Secondary | ICD-10-CM | POA: Diagnosis not present

## 2020-01-03 DIAGNOSIS — F3341 Major depressive disorder, recurrent, in partial remission: Secondary | ICD-10-CM | POA: Diagnosis not present

## 2020-01-03 DIAGNOSIS — F17200 Nicotine dependence, unspecified, uncomplicated: Secondary | ICD-10-CM | POA: Diagnosis not present

## 2020-01-03 MED ORDER — PAROXETINE HCL 30 MG PO TABS: ORAL_TABLET | ORAL | 1 refills | Status: DC

## 2020-01-03 MED ORDER — HYDROXYZINE HCL 10 MG PO TABS: 10.0000 mg | ORAL_TABLET | Freq: Three times a day (TID) | ORAL | 1 refills | Status: DC | PRN

## 2020-01-03 NOTE — Telephone Encounter (Signed)
Please review

## 2020-01-03 NOTE — Progress Notes (Signed)
[facility] Med Check  Patient ID: Robert Wise,  MRN: [id number]  PCP: [name], MD  Date of Evaluation: 01/03/2020 Time spent:30 minutes  Chief Complaint:  Chief Complaint    Depression; Anxiety; Medication Refill      HISTORY/CURRENT STATUS: HPI For routine med check.  Doing really well except gets irritable easy, for about a month. Has a short fuse. Gets aggravated easily and 'kind of hot-headed.'  Not sure why. No increase in caffeine, but he does drink 3-4 iced coffees a day.  He has been doing that for a very long time. He's been moving his MIL, starting about 6 weeks ago. Gets really antsy.  Having panic attacks.  He just feels kind of jittery on the inside.  He is still smoking.  He has quit 3 times in the past so he knows that it is possible.  "I just have to make up my mind to do it."  Patient denies loss of interest in usual activities and is able to enjoy things.  Denies decreased energy or motivation.  Appetite has not changed.  No extreme sadness, tearfulness, or feelings of hopelessness.  Denies any changes in concentration, making decisions or remembering things.  He sleeps well.  Uses CPAP as directed.  Denies suicidal or homicidal thoughts.  Patient denies increased energy with decreased need for sleep, no increased talkativeness, no racing thoughts, no impulsivity or risky behaviors, no increased spending, no increased libido, no grandiosity, no paranoia, and no hallucinations.  Denies dizziness, syncope, seizures, numbness, tingling, tremor, tics, unsteady gait, slurred speech, confusion. Denies muscle or joint pain, stiffness, or dystonia.  Individual Medical History/ Review of Systems: Changes? :Yes  He fell yesterday.  Did not hurt himself seriously but his back and legs are sore today.  He is limping a little bit, and has a hard time getting up from a sitting position.  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  atorvastatin (LIPITOR) 80 MG tablet, Take 1 tablet (80 mg total) by mouth daily., Disp: 90 tablet, Rfl: 0 .  B Complex Vitamins (B-COMPLEX/B-12 PO), Take by mouth., Disp: , Rfl:  .  blood glucose meter kit and supplies KIT, Dispense based on patient and insurance preference. Use up to four times daily as directed. (FOR ICD-9 250.00, 250.01)., Disp: 1 each, Rfl: 0 .  Blood Glucose Monitoring Suppl (ONE TOUCH ULTRA SYSTEM KIT) W/DEVICE KIT, 1 kit by Does not apply route once., Disp: 1 each, Rfl: 0 .  busPIRone (BUSPAR) 30 MG tablet, TAKE 1 TABLET (30 MG TOTAL) BY MOUTH 2 (TWO) TIMES DAILY., Disp: 180 tablet, Rfl: 1 .  cholecalciferol (VITAMIN D3) 25 MCG (1000 UT) tablet, Take 2,000 Units by mouth daily., Disp: , Rfl:  .  esomeprazole (NEXIUM) 40 MG capsule, Take 1 capsule (40 mg total) by mouth daily., Disp: 90 capsule, Rfl: 3 .  ferrous sulfate 325 (65 FE) MG tablet, Take 65 mg by mouth daily with breakfast., Disp: , Rfl:  .  fish oil-omega-3 fatty acids 1000 MG capsule, Take 2 g by mouth daily., Disp: , Rfl:  .  Ginkgo Biloba Extract (GNP GINGKO BILOBA EXTRACT) 60 MG CAPS, Take by  mouth., Disp: , Rfl:  .  glipiZIDE (GLUCOTROL) 10 MG tablet, Take 1 tablet (10 mg total) by mouth 2 (two) times daily before a meal., Disp: 60 tablet, Rfl: 2 .  glucose blood (ONE TOUCH ULTRA TEST) test strip, 1 each by Other route daily., Disp: 100 each, Rfl: 8 .  lamoTRIgine (LAMICTAL) 200 MG tablet, TAKE 1 TABLET BY MOUTH TWICE A DAY, Disp: 180 tablet, Rfl: 1 .  Lancets (ONETOUCH ULTRASOFT) lancets, USE AS INSTRUCTED, Disp: 100 each, Rfl: 0 .  levothyroxine (SYNTHROID) 175  MCG tablet, Take 1 tablet (175 mcg total) by mouth daily before breakfast., Disp: 90 tablet, Rfl: 0 .  lisinopril (ZESTRIL) 5 MG tablet, TAKE 1 TABLET BY MOUTH EVERY DAY, Disp: 90 tablet, Rfl: 1 .  PARoxetine (PAXIL) 30 MG tablet, TAKE 2 TABLETS BY MOUTH EVERY DAY, Disp: 180 tablet, Rfl: 1 .  XIGDUO XR 07-998 MG TB24, TAKE 2 TABLETS BY MOUTH EVERY DAY, Disp: 180 tablet, Rfl: 1 .  hydrOXYzine (ATARAX/VISTARIL) 10 MG tablet, Take 1-3 tablets (10-30 mg total) by mouth 3 (three) times daily as needed., Disp: 60 tablet, Rfl: 1 Medication Side Effects: none  Family Medical/ Social History: Changes? No  MENTAL HEALTH EXAM:  There were no vitals taken for this visit.There is no height or weight on file to calculate BMI.  General Appearance: Casual, Neat and Well Groomed  Eye Contact:  Good  Speech:  Clear and Coherent and Normal Rate  Volume:  Normal  Mood:  Euthymic  Affect:  Appropriate  Thought Process:  Goal Directed and Descriptions of Associations: Intact  Orientation:  Full (Time, Place, and Person)  Thought Content: Logical   Suicidal Thoughts:  No  Homicidal Thoughts:  No  Memory:  WNL  Judgement:  Good  Insight:  Good  Psychomotor Activity:  He limps a little but otherwise normal.  Concentration:  Concentration: Good  Recall:  Good  Fund of Knowledge: Good  Language: Good  Assets:  Desire for Improvement  ADL's:  Intact  Cognition: WNL  Prognosis:  Good    DIAGNOSES:    ICD-10-CM   1. Irritability  R45.4   2. Recurrent major depressive disorder, in partial remission ([place])  F33.41   3. Generalized anxiety disorder  F41.1   4. Smoker  F17.200   5. Obstructive sleep apnea  G47.33     Receiving Psychotherapy: No    RECOMMENDATIONS:  PDMP was reviewed. I provided 30 minutes of face-to-face time during this encounter. We discussed the irritability and "short fuse."  I believe the stress he is under is causing a lot of fat, and once he and his wife get his mother-in-law  moved, his symptoms will improve.  However, the caffeine is not helping anything, even though he has not increased the amount.  I recommend decreasing to no more than 2 coffees per day. We discussed hydroxyzine.  Benefits, risks, and side effects were discussed.  This can help with the anxiety.  He understands that it can cause sedation however and will try it at home before he uses it at work.  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  Start hydroxyzine 10 mg, 1-3 p.o. every 8 hours as needed anxiety. Continue BuSpar 30 mg, 1 p.o. twice daily.  Continue Lamictal 200 mg, 1 p.o. twice daily. Continue Paxil 30 mg, 2 p.o. daily. Continue multivitamin, ginkgo biloba, B complex, fish oil, vitamin D. Continue using CPAP. Return in 3 months. He  will call if the hydroxyzine is not effective, before his next visit.  Donnal Moat, PA-C

## 2020-01-15 DIAGNOSIS — M62830 Muscle spasm of back: Secondary | ICD-10-CM | POA: Diagnosis not present

## 2020-01-15 DIAGNOSIS — M545 Low back pain, unspecified: Secondary | ICD-10-CM | POA: Diagnosis not present

## 2020-01-28 ENCOUNTER — Encounter: Payer: Self-pay | Admitting: Family Medicine

## 2020-01-28 ENCOUNTER — Other Ambulatory Visit: Payer: Self-pay

## 2020-01-28 ENCOUNTER — Ambulatory Visit: Payer: BC Managed Care – PPO | Admitting: Family Medicine

## 2020-01-28 VITALS — BP 136/72 | HR 71 | Temp 98.0°F | Ht 72.0 in | Wt 230.0 lb

## 2020-01-28 DIAGNOSIS — Z024 Encounter for examination for driving license: Secondary | ICD-10-CM

## 2020-01-28 NOTE — Telephone Encounter (Signed)
I can see august visit - need CPAP download in past month if possible. Thanks.

## 2020-01-28 NOTE — Patient Instructions (Addendum)
  1 year card for DOT. Stay safe.    If you have lab work done today you will be contacted with your lab results within the next 2 weeks.  If you have not heard from [place] then please contact us. The fastest way to get your results is to register for My Chart.   IF you received an x-ray today, you will receive an invoice from [facility] Radiology. Please contact [facility] Radiology at [phone] with questions or concerns regarding your invoice.   IF you received labwork today, you will receive an invoice from [place]. Please contact [facility] at [phone] with questions or concerns regarding your invoice.   Our billing staff will not be able to assist you with questions regarding bills from these companies.  You will be contacted with the lab results as soon as they are available. The fastest way to get your results is to activate your My Chart account. Instructions are located on the last page of this paperwork. If you have not heard from [place] regarding the results in 2 weeks, please contact this office.

## 2020-01-28 NOTE — Telephone Encounter (Signed)
C-PAP info has been placed in your box in provider are for review.

## 2020-01-28 NOTE — Progress Notes (Signed)
Subjective:  Patient ID: Robert Wise, male    DOB: 05/14/1960  Age: 59 y.o. MRN: [id number]  CC:  Chief Complaint  Patient presents with  . DOT    PT reports he feels well with no complaints.    HPI Robert Wise presents for   DOT physical.   History of hypothyroidism, hyperlipidemia, managed with medication and stable on recent labs.  No new side effects of meds.  No sedation with meds.   History of diabetes, complicated by hyperglycemia previously.  Treated with xigduo, glipizide, no insulin. No symptomatic lows.  No readings over 250.  Lab Results  Component Value Date   HGBA1C 8.6 (H) 11/08/2019   OSA on CPAP Met with sleep specialist August 9.  Good compliance based on CPAP download.  Some leak noted, new supplies ordered. Working well - nightly use.  Feels rested during day, no daytime sedation.  Has CPAP report for DOT physical.   Anxiety/depression Appointment October 4 with psychiatry. Hydroxyzine was prescribed for symptoms, potential sedation discussed, initially recommended at home only.  Continued on other medication regimen. Too much sedation with once dose hydroxyzine - not taking.  Feels like symptoms controlled - no si/hi, no distraction or focus issues with driving.   Denies vision changes, history of glaucoma, aphakia, macular degeneration or other chronic eye disorder, no difficulty with glare at night. Wears glasses to drive.   No musculoskeletal weakness or deficits.loss of use.   No chest pain with exertion or dyspnea with exertion.  No known history of coronary disease.  History Patient Active Problem List   Diagnosis Date Noted  . Anxiety state 01/25/2018  . Fatigue 01/25/2018  . Uncontrolled type 2 diabetes mellitus with complication, without long-term current use of insulin ([place]) 02/10/2015  . Chronic pain 12/01/2014  . Depression 10/12/2014   Past Medical History:  Diagnosis Date  . Anxiety   . Depression   . Diabetes  mellitus without complication ([place])   . Hyperlipidemia   . OSA (obstructive sleep apnea) 12/15/2012   Past Surgical History:  Procedure Laterality Date  . ANKLE SURGERY Left 2006  . VASECTOMY     Allergies  Allergen Reactions  . Tricor [Fenofibrate]   . Zocor [Simvastatin]    Prior to Admission medications   Medication Sig Start Date End Date Taking? Authorizing Provider  atorvastatin (LIPITOR) 80 MG tablet Take 1 tablet (80 mg total) by mouth daily. 11/01/19  Yes [name], MD  B Complex Vitamins (B-COMPLEX/B-12 PO) Take by mouth.   Yes [provider]  blood glucose meter kit and supplies KIT Dispense based on patient and insurance preference. Use up to four times daily as directed. (FOR ICD-9 250.00, 250.01). 01/07/19  Yes Stallings, Zoe A, MD  Blood Glucose Monitoring Suppl (ONE TOUCH ULTRA SYSTEM KIT) W/DEVICE KIT 1 kit by Does not apply route once. 10/25/11  Yes Weber, Sarah L, PA-C  busPIRone (BUSPAR) 30 MG tablet TAKE 1 TABLET (30 MG TOTAL) BY MOUTH 2 (TWO) TIMES DAILY. 01/03/20  Yes Hurst, Dorothea Glassman, PA-C  cholecalciferol (VITAMIN D3) 25 MCG (1000 UT) tablet Take 2,000 Units by mouth daily.   Yes [provider]  esomeprazole (NEXIUM) 40 MG capsule Take 1 capsule (40 mg total) by mouth daily. 04/11/17  Yes Jaynee Eagles, PA-C  ferrous sulfate 325 (65 FE) MG tablet Take 65 mg by mouth daily with breakfast.   Yes [provider]  fish oil-omega-3 fatty acids 1000 MG capsule Take  2 g by mouth daily.   Yes [provider]  Ginkgo Biloba Extract (GNP GINGKO BILOBA EXTRACT) 60 MG CAPS Take by mouth.   Yes [provider]  glipiZIDE (GLUCOTROL) 10 MG tablet Take 1 tablet (10 mg total) by mouth 2 (two) times daily before a meal. 12/20/19  Yes [name], MD  glucose blood (ONE TOUCH ULTRA TEST) test strip 1 each by Other route daily. 12/01/14  Yes Jaynee Eagles, PA-C  hydrOXYzine (ATARAX/VISTARIL) 10 MG tablet Take 1-3 tablets (10-30 mg total)  by mouth 3 (three) times daily as needed. 01/03/20  Yes Hurst, Helene Kelp T, PA-C  lamoTRIgine (LAMICTAL) 200 MG tablet TAKE 1 TABLET BY MOUTH TWICE A DAY 01/03/20  Yes Hurst, Teresa T, PA-C  Lancets [facility] ULTRASOFT) lancets USE AS INSTRUCTED 11/28/13  Yes [name], MD  levothyroxine (SYNTHROID) 175 MCG tablet Take 1 tablet (175 mcg total) by mouth daily before breakfast. 11/01/19  Yes [name], MD  lisinopril (ZESTRIL) 5 MG tablet TAKE 1 TABLET BY MOUTH EVERY DAY 08/19/19  Yes [name], MD  PARoxetine (PAXIL) 30 MG tablet TAKE 2 TABLETS BY MOUTH EVERY DAY 01/03/20  Yes Hurst, Teresa T, PA-C  XIGDUO XR 07-998 MG TB24 TAKE 2 TABLETS BY MOUTH EVERY DAY 08/27/19  Yes [name], MD   Social History   Socioeconomic History  . Marital status: Married    Spouse name: [name]  . Number of children: 3  . Years of education: 11.5  . Highest education level: Not on file  Occupational History    Employer: [employer]    Comment: Driver  Tobacco Use  . Smoking status: Current Every Day Smoker    Packs/day: 1.00    Years: 37.00    Pack years: 37.00    Types: Cigarettes    Start date: 04/02/2019  . Smokeless tobacco: Never Used  Substance and Sexual Activity  . Alcohol use: No    Comment: quit: 08/14/2011  . Drug use: No  . Sexual activity: Yes    Birth control/protection: None  Other Topics Concern  . Not on file  Social History Narrative   Patient lives at home with family.    Caffeine Use: 1 pot of coffee daily   Social Determinants of Health   Financial Resource Strain:   . Difficulty of Paying Living Expenses: Not on file  Food Insecurity:   . Worried About Charity fundraiser in the Last Year: Not on file  . Ran Out of Food in the Last Year: Not on file  Transportation Needs:   . Lack of Transportation (Medical): Not on file  . Lack of Transportation (Non-Medical): Not on file  Physical Activity:   . Days of Exercise per Week: Not on file  . Minutes of  Exercise per Session: Not on file  Stress:   . Feeling of Stress : Not on file  Social Connections:   . Frequency of Communication with Friends and Family: Not on file  . Frequency of Social Gatherings with Friends and Family: Not on file  . Attends Religious Services: Not on file  . Active Member of Clubs or Organizations: Not on file  . Attends Archivist Meetings: Not on file  . Marital Status: Not on file  Intimate Partner Violence:   . Fear of Current or Ex-Partner: Not on file  . Emotionally Abused: Not on file  . Physically Abused: Not on file  . Sexually Abused: Not on  file    Review of Systems As above and driver form reviewed.   Objective:   Vitals:   01/28/20 1014 01/28/20 1020  BP: (!) 150/80 136/72  Pulse: 71   Temp: 98 F (36.7 C)   TempSrc: Temporal   SpO2: 96%   Weight: 230 lb (104.3 kg)   Height: 6' (1.829 m)      Physical Exam Vitals reviewed.  Constitutional:      Appearance: He is well-developed.  HENT:     Head: Normocephalic and atraumatic.     Right Ear: External ear normal.     Left Ear: External ear normal.  Eyes:     Conjunctiva/sclera: Conjunctivae normal.     Pupils: Pupils are equal, round, and reactive to light.  Neck:     Thyroid: No thyromegaly.  Cardiovascular:     Rate and Rhythm: Normal rate and regular rhythm.     Heart sounds: Normal heart sounds.  Pulmonary:     Effort: Pulmonary effort is normal. No respiratory distress.     Breath sounds: Normal breath sounds. No wheezing.  Abdominal:     General: There is no distension.     Palpations: Abdomen is soft.     Tenderness: There is no abdominal tenderness.     Hernia: There is no hernia in the left inguinal area or right inguinal area.  Musculoskeletal:        General: No tenderness. Normal range of motion.     Cervical back: Normal range of motion and neck supple.  Lymphadenopathy:     Cervical: No cervical adenopathy.  Skin:    General: Skin is warm and  dry.  Neurological:     Mental Status: He is alert and oriented to person, place, and time.     Deep Tendon Reflexes: Reflexes are normal and symmetric.  Psychiatric:        Mood and Affect: Mood normal.        Behavior: Behavior normal.     Assessment & Plan:  Robert Wise is a 59 y.o. male . Encounter for commercial driver medical examination (CDME)  -Physical completed, see forms.  OSA on CPAP, compliant without daytime somnolence.  Mood stable, and not taking sedating medication.  Diabetes overall stable, not on insulin.  Provided 1 year card.  No orders of the defined types were placed in this encounter.  Patient Instructions    1 year card for DOT. Stay safe.    If you have lab work done today you will be contacted with your lab results within the next 2 weeks.  If you have not heard from [place] then please contact us. The fastest way to get your results is to register for My Chart.   IF you received an x-ray today, you will receive an invoice from [facility]. Please contact [facility] at [phone] with questions or concerns regarding your invoice.   IF you received labwork today, you will receive an invoice from [facility]. Please contact [facility] at [phone] with questions or concerns regarding your invoice.   Our billing staff will not be able to assist you with questions regarding bills from these companies.  You will be contacted with the lab results as soon as they are available. The fastest way to get your results is to activate your My Chart account. Instructions are located on the last page of this paperwork. If you have not heard from [place] regarding the results in 2 weeks, please contact this office.  Signed, Merri Ray, MD [facility]

## 2020-01-28 NOTE — Telephone Encounter (Signed)
Pt forwarding CPAP compliance to you from [facility]

## 2020-01-31 ENCOUNTER — Encounter: Payer: Self-pay | Admitting: Family Medicine

## 2020-02-14 ENCOUNTER — Other Ambulatory Visit: Payer: Self-pay | Admitting: Family Medicine

## 2020-02-14 DIAGNOSIS — E039 Hypothyroidism, unspecified: Secondary | ICD-10-CM

## 2020-02-17 ENCOUNTER — Other Ambulatory Visit: Payer: Self-pay | Admitting: Family Medicine

## 2020-02-17 DIAGNOSIS — E1165 Type 2 diabetes mellitus with hyperglycemia: Secondary | ICD-10-CM

## 2020-02-26 DIAGNOSIS — R21 Rash and other nonspecific skin eruption: Secondary | ICD-10-CM | POA: Diagnosis not present

## 2020-03-12 ENCOUNTER — Other Ambulatory Visit: Payer: Self-pay | Admitting: Family Medicine

## 2020-03-12 DIAGNOSIS — E1165 Type 2 diabetes mellitus with hyperglycemia: Secondary | ICD-10-CM

## 2020-03-12 NOTE — Telephone Encounter (Signed)
Requested Prescriptions  Pending Prescriptions Disp Refills   glipiZIDE (GLUCOTROL) 10 MG tablet [Pharmacy Med Name: GLIPIZIDE 10 MG TABLET] 180 tablet 0    Sig: TAKE 1 TABLET (10 MG TOTAL) BY MOUTH 2 (TWO) TIMES DAILY BEFORE A MEAL.     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)   [facility], Asencion Partridge, MD   2 months ago Type 2 diabetes mellitus with hyperglycemia, without long-term current use of insulin [facility])   [facility], Asencion Partridge, MD   4 months ago Type 2 diabetes mellitus with hyperglycemia, without long-term current use of insulin [facility])   [facility], Asencion Partridge, MD   4 months ago Mixed hyperlipidemia   [facility], Asencion Partridge, MD   10 months ago Type 2 diabetes mellitus with hyperglycemia, without long-term current use of insulin [facility])   [facility], Asencion Partridge, MD

## 2020-03-13 ENCOUNTER — Other Ambulatory Visit: Payer: Self-pay | Admitting: Family Medicine

## 2020-03-13 DIAGNOSIS — E782 Mixed hyperlipidemia: Secondary | ICD-10-CM

## 2020-03-13 DIAGNOSIS — E1165 Type 2 diabetes mellitus with hyperglycemia: Secondary | ICD-10-CM

## 2020-03-13 DIAGNOSIS — E039 Hypothyroidism, unspecified: Secondary | ICD-10-CM

## 2020-03-13 NOTE — Telephone Encounter (Signed)
Requested Prescriptions  Pending Prescriptions Disp Refills   atorvastatin (LIPITOR) 80 MG tablet [Pharmacy Med Name: ATORVASTATIN 80 MG TABLET] 90 tablet 2    Sig: TAKE 1 TABLET BY MOUTH EVERY DAY     Cardiovascular:  Antilipid - Statins Failed - 03/13/2020  3:29 PM      Failed - LDL in normal range and within 360 days    LDL Chol Calc (NIH)  Date Value Ref Range Status  12/20/2019 77 0 - 99 mg/dL Final         Failed - HDL in normal range and within 360 days    HDL  Date Value Ref Range Status  12/20/2019 26 (L) >39 mg/dL Final         Failed - Triglycerides in normal range and within 360 days    Triglycerides  Date Value Ref Range Status  12/20/2019 217 (H) 0 - 149 mg/dL Final         Passed - Total Cholesterol in normal range and within 360 days    Cholesterol, Total  Date Value Ref Range Status  12/20/2019 139 100 - 199 mg/dL Final         Passed - Patient is not pregnant      Passed - Valid encounter within last 12 months    Recent Outpatient Visits          1 month ago 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)   [facility], Asencion Partridge, MD   2 months ago Type 2 diabetes mellitus with hyperglycemia, without long-term current use of insulin [facility])   [facility], Asencion Partridge, MD   4 months ago Type 2 diabetes mellitus with hyperglycemia, without long-term current use of insulin [facility])   [facility], Asencion Partridge, MD   4 months ago Mixed hyperlipidemia   [facility], Asencion Partridge, MD   10 months ago Type 2 diabetes mellitus with hyperglycemia, without long-term current use of insulin [facility])   [facility], Asencion Partridge, MD              lisinopril (ZESTRIL) 5 MG tablet [Pharmacy Med Name: LISINOPRIL 5 MG TABLET] 90 tablet     Sig: TAKE 1 TABLET BY MOUTH EVERY DAY     Cardiovascular:  ACE Inhibitors Failed - 03/13/2020  3:29 PM      Failed - Cr in normal range and  within 180 days    Creat  Date Value Ref Range Status  12/29/2015 0.72 0.70 - 1.33 mg/dL Final    Comment:      For patients > or = 59 years of age: The upper reference limit for Creatinine is approximately 13% higher for people identified as African-American.      Creatinine, Ser  Date Value Ref Range Status  04/26/2019 0.81 0.76 - 1.27 mg/dL Final   Creatinine, Urine  Date Value Ref Range Status  08/01/2014 381.5 mg/dL Final    Comment:    Result repeated and verified. Result confirmed by automatic dilution. No reference range established.          Failed - K in normal range and within 180 days    Potassium  Date Value Ref Range Status  04/26/2019 4.6 3.5 - 5.2 mmol/L Final         Passed - Patient is not pregnant      Passed - Last BP in normal range  BP Readings from Last 1 Encounters:  01/28/20 136/72         Passed - Valid encounter within last 6 months    Recent Outpatient Visits          1 month ago Encounter for commercial driver medical examination (CDME)   [facility], Asencion Partridge, MD   2 months ago Type 2 diabetes mellitus with hyperglycemia, without long-term current use of insulin [facility])   [facility], Asencion Partridge, MD   4 months ago Type 2 diabetes mellitus with hyperglycemia, without long-term current use of insulin [facility])   [facility], Asencion Partridge, MD   4 months ago Mixed hyperlipidemia   [facility], Asencion Partridge, MD   10 months ago Type 2 diabetes mellitus with hyperglycemia, without long-term current use of insulin [facility])   [facility], Asencion Partridge, MD              levothyroxine (SYNTHROID) 175 MCG tablet [Pharmacy Med Name: LEVOTHYROXINE 175 MCG TABLET] 90 tablet     Sig: TAKE 1 TABLET (175 MCG TOTAL) BY MOUTH DAILY BEFORE BREAKFAST.     Endocrinology:  Hypothyroid Agents Failed - 03/13/2020  3:29 PM      Failed - TSH needs to be rechecked within 3 months  after an abnormal result. Refill until TSH is due.      Passed - TSH in normal range and within 360 days    TSH  Date Value Ref Range Status  12/20/2019 1.330 0.450 - 4.500 uIU/mL Final         Passed - Valid encounter within last 12 months    Recent Outpatient Visits          1 month ago Encounter for commercial driver medical examination (CDME)   [facility], Asencion Partridge, MD   2 months ago Type 2 diabetes mellitus with hyperglycemia, without long-term current use of insulin [facility])   [facility], Asencion Partridge, MD   4 months ago Type 2 diabetes mellitus with hyperglycemia, without long-term current use of insulin [facility])   [facility], Asencion Partridge, MD   4 months ago Mixed hyperlipidemia   [facility], Asencion Partridge, MD   10 months ago Type 2 diabetes mellitus with hyperglycemia, without long-term current use of insulin [facility])   [facility], Asencion Partridge, MD

## 2020-04-03 ENCOUNTER — Other Ambulatory Visit: Payer: Self-pay

## 2020-04-03 ENCOUNTER — Encounter: Payer: Self-pay | Admitting: Physician Assistant

## 2020-04-03 ENCOUNTER — Ambulatory Visit (INDEPENDENT_AMBULATORY_CARE_PROVIDER_SITE_OTHER): Payer: BC Managed Care – PPO | Admitting: Physician Assistant

## 2020-04-03 DIAGNOSIS — F17200 Nicotine dependence, unspecified, uncomplicated: Secondary | ICD-10-CM | POA: Diagnosis not present

## 2020-04-03 DIAGNOSIS — F411 Generalized anxiety disorder: Secondary | ICD-10-CM | POA: Diagnosis not present

## 2020-04-03 DIAGNOSIS — G4733 Obstructive sleep apnea (adult) (pediatric): Secondary | ICD-10-CM

## 2020-04-03 DIAGNOSIS — F3341 Major depressive disorder, recurrent, in partial remission: Secondary | ICD-10-CM | POA: Diagnosis not present

## 2020-04-03 NOTE — Progress Notes (Signed)
[facility] Med Check  Patient ID: ERVAN HEBER,  MRN: [id number]  PCP: [name], MD  Date of Evaluation: 04/03/2020 Time spent:20 minutes  Chief Complaint:  Chief Complaint    Anxiety; Depression; Follow-up      HISTORY/CURRENT STATUS: HPI For routine med check.  At the last visit 3 months ago I added hydroxyzine as a rescue for anxiety.  He only took it 1 time and it caused extreme sedation so he did not take it anymore.  As far as the anxiety and irritability goes "I feel like anybody else does.  I get aggravated about work sometimes but I do not think it is worse than what other people would get aggravated about."  Patient denies loss of interest in usual activities and is able to enjoy things.  Denies decreased energy or motivation.  Appetite has not changed.  No extreme sadness, tearfulness, or feelings of hopelessness. He sleeps well.  Uses CPAP as directed.  Denies suicidal or homicidal thoughts.  Patient denies increased energy with decreased need for sleep, no increased talkativeness, no racing thoughts, no impulsivity or risky behaviors, no increased spending, no increased libido, no grandiosity, no paranoia, and no hallucinations.  Denies dizziness, syncope, seizures, numbness, tingling, tremor, tics, unsteady gait, slurred speech, confusion. Denies muscle or joint pain, stiffness, or dystonia.  Individual Medical History/ Review of Systems: Changes? 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  atorvastatin (LIPITOR) 80 MG tablet, TAKE 1  TABLET BY MOUTH EVERY DAY, Disp: 90 tablet, Rfl: 2 .  B Complex Vitamins (B-COMPLEX/B-12 PO), Take by mouth., Disp: , Rfl:  .  blood glucose meter kit and supplies KIT, Dispense based on patient and insurance preference. Use up to four times daily as directed. (FOR ICD-9 250.00, 250.01)., Disp: 1 each, Rfl: 0 .  Blood Glucose Monitoring Suppl (ONE TOUCH ULTRA SYSTEM KIT) W/DEVICE KIT, 1 kit by Does not apply route once., Disp: 1 each, Rfl: 0 .  busPIRone (BUSPAR) 30 MG tablet, TAKE 1 TABLET (30 MG TOTAL) BY MOUTH 2 (TWO) TIMES DAILY., Disp: 180 tablet, Rfl: 1 .  cholecalciferol (VITAMIN D3) 25 MCG (1000 UT) tablet, Take 2,000 Units by mouth daily., Disp: , Rfl:  .  esomeprazole (NEXIUM) 40 MG capsule, Take 1 capsule (40 mg total) by mouth daily., Disp: 90 capsule, Rfl: 3 .  ferrous sulfate 325 (65 FE) MG tablet, Take 65 mg by mouth daily with breakfast., Disp: , Rfl:  .  fish oil-omega-3 fatty acids 1000 MG capsule, Take 2 g by mouth daily., Disp: , Rfl:  .  Ginkgo Biloba Extract 60 MG CAPS, Take by mouth., Disp: , Rfl:  .  glipiZIDE (GLUCOTROL) 10 MG tablet, TAKE 1 TABLET (10 MG TOTAL) BY MOUTH 2 (TWO) TIMES DAILY BEFORE A MEAL., Disp: 180 tablet, Rfl: 0 .  glucose blood (ONE TOUCH ULTRA TEST) test strip, 1 each by Other route daily., Disp: 100 each, Rfl: 8 .  lamoTRIgine (LAMICTAL) 200 MG tablet, TAKE 1 TABLET BY MOUTH TWICE A DAY, Disp: 180 tablet, Rfl: 1 .  Lancets (ONETOUCH ULTRASOFT) lancets, USE AS INSTRUCTED, Disp: 100 each,  Rfl: 0 .  levothyroxine (SYNTHROID) 175 MCG tablet, TAKE 1 TABLET (175 MCG TOTAL) BY MOUTH DAILY BEFORE BREAKFAST., Disp: 90 tablet, Rfl: 0 .  lisinopril (ZESTRIL) 5 MG tablet, TAKE 1 TABLET BY MOUTH EVERY DAY, Disp: 90 tablet, Rfl: 0 .  PARoxetine (PAXIL) 30 MG tablet, TAKE 2 TABLETS BY MOUTH EVERY DAY, Disp: 180 tablet, Rfl: 1 .  XIGDUO XR 07-998 MG TB24, TAKE 2 TABLETS BY MOUTH EVERY DAY, Disp: 180 tablet, Rfl: 1 Medication Side Effects: none  Family Medical/  Social History: Changes? No  MENTAL HEALTH EXAM:  There were no vitals taken for this visit.There is no height or weight on file to calculate BMI.  General Appearance: Casual, Neat and Well Groomed  Eye Contact:  Good  Speech:  Clear and Coherent and Normal Rate  Volume:  Normal  Mood:  Euthymic  Affect:  Appropriate  Thought Process:  Goal Directed and Descriptions of Associations: Intact  Orientation:  Full (Time, Place, and Person)  Thought Content: Logical   Suicidal Thoughts:  No  Homicidal Thoughts:  No  Memory:  WNL  Judgement:  Good  Insight:  Good  Psychomotor Activity:  Normal  Concentration:  Concentration: Good  Recall:  Good  Fund of Knowledge: Good  Language: Good  Assets:  Desire for Improvement  ADL's:  Intact  Cognition: WNL  Prognosis:  Good    DIAGNOSES:    ICD-10-CM   1. Recurrent major depressive disorder, in partial remission ([place])  F33.41   2. Generalized anxiety disorder  F41.1   3. Smoker  F17.200   4. Obstructive sleep apnea  G47.33     Receiving Psychotherapy: No    RECOMMENDATIONS:  PDMP was reviewed. I provided 20 minutes of face-to-face time during this encounter in which we discussed our treatment plan.  He is doing well with the current treatment regimen so no changes will be made except for stopping the hydroxyzine. Smoking cessation discussed. Discontinue the hydroxyzine due to side effects. Continue BuSpar 30 mg, 1 p.o. twice daily.  Continue Lamictal 200 mg, 1 p.o. twice daily. Continue Paxil 30 mg, 2 p.o. daily. Continue multivitamin, ginkgo biloba, B complex, fish oil, vitamin D. Continue using CPAP. Return in 6 months.  Donnal Moat, PA-C

## 2020-04-04 DIAGNOSIS — Z03818 Encounter for observation for suspected exposure to other biological agents ruled out: Secondary | ICD-10-CM | POA: Diagnosis not present

## 2020-04-04 DIAGNOSIS — R0981 Nasal congestion: Secondary | ICD-10-CM | POA: Diagnosis not present

## 2020-05-30 ENCOUNTER — Other Ambulatory Visit: Payer: Self-pay | Admitting: Family Medicine

## 2020-05-30 ENCOUNTER — Other Ambulatory Visit: Payer: Self-pay | Admitting: Physician Assistant

## 2020-05-30 DIAGNOSIS — E039 Hypothyroidism, unspecified: Secondary | ICD-10-CM

## 2020-05-30 DIAGNOSIS — E1165 Type 2 diabetes mellitus with hyperglycemia: Secondary | ICD-10-CM

## 2020-05-30 NOTE — Telephone Encounter (Signed)
Requested Prescriptions  Pending Prescriptions Disp Refills  . lisinopril (ZESTRIL) 5 MG tablet [Pharmacy Med Name: LISINOPRIL 5 MG TABLET] 90 tablet 1    Sig: TAKE 1 TABLET BY MOUTH EVERY DAY     Cardiovascular:  ACE Inhibitors Failed - 05/30/2020  4:56 PM      Failed - Cr in normal range and within 180 days    Creat  Date Value Ref Range Status  12/29/2015 0.72 0.70 - 1.33 mg/dL Final    Comment:      For patients > or = 60 years of age: The upper reference limit for Creatinine is approximately 13% higher for people identified as African-American.      Creatinine, Ser  Date Value Ref Range Status  04/26/2019 0.81 0.76 - 1.27 mg/dL Final   Creatinine, Urine  Date Value Ref Range Status  08/01/2014 381.5 mg/dL Final    Comment:    Result repeated and verified. Result confirmed by automatic dilution. No reference range established.          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)   [facility], Asencion Partridge, MD   5 months ago Type 2 diabetes mellitus with hyperglycemia, without long-term current use of insulin [facility])   [facility], Asencion Partridge, MD   6 months ago Type 2 diabetes mellitus with hyperglycemia, without long-term current use of insulin [facility])   [facility], Asencion Partridge, MD   7 months ago Mixed hyperlipidemia   [facility], Asencion Partridge, MD   1 year ago Type 2 diabetes mellitus with hyperglycemia, without long-term current use of insulin [facility])   [facility], Asencion Partridge, MD             .  glipiZIDE (GLUCOTROL) 10 MG tablet [Pharmacy Med Name: GLIPIZIDE 10 MG TABLET] 180 tablet 1    Sig: TAKE 1 TABLET (10 MG TOTAL) BY MOUTH 2 (TWO) TIMES DAILY BEFORE A MEAL.     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)   [facility], Asencion Partridge, MD  5 months ago Type 2 diabetes mellitus with hyperglycemia, without long-term current use of insulin [facility])   [facility], Asencion Partridge, MD   6 months ago Type 2 diabetes mellitus with hyperglycemia, without long-term current use of insulin [facility])   [facility], Asencion Partridge, MD   7 months ago Mixed hyperlipidemia   [facility], Asencion Partridge, MD   1 year ago Type 2 diabetes mellitus with hyperglycemia, without long-term current use of insulin [facility])   [facility], Asencion Partridge, MD             . levothyroxine (SYNTHROID) 175 MCG tablet [Pharmacy Med Name: LEVOTHYROXINE 175 MCG TABLET] 90 tablet 3    Sig: TAKE 1 TABLET (175 MCG TOTAL) BY MOUTH DAILY BEFORE BREAKFAST.     Endocrinology:  Hypothyroid Agents Failed - 05/30/2020  4:56 PM      Failed - TSH needs to be rechecked within 3 months after an abnormal result. Refill until TSH is due.      Passed - TSH in normal range and within 360 days    TSH  Date Value Ref Range Status  12/20/2019 1.330 0.450 - 4.500 uIU/mL Final         Passed - Valid encounter within last 12 months    Recent Outpatient Visits          4 months ago Encounter for commercial  driver medical examination (CDME)   [facility], Asencion Partridge, MD   5 months ago Type 2 diabetes mellitus with hyperglycemia, without long-term current use of insulin [facility])   [facility], Asencion Partridge, MD   6 months ago Type 2 diabetes mellitus with hyperglycemia, without long-term current use of insulin [facility])   [facility], Asencion Partridge, MD   7 months ago Mixed hyperlipidemia   [facility], Asencion Partridge, MD   1 year ago Type 2 diabetes mellitus with hyperglycemia, without long-term current use of insulin [facility])   [facility], Asencion Partridge, MD

## 2020-08-29 ENCOUNTER — Other Ambulatory Visit: Payer: Self-pay | Admitting: Family Medicine

## 2020-08-29 ENCOUNTER — Other Ambulatory Visit: Payer: Self-pay | Admitting: Physician Assistant

## 2020-08-29 DIAGNOSIS — E1165 Type 2 diabetes mellitus with hyperglycemia: Secondary | ICD-10-CM

## 2020-09-18 ENCOUNTER — Telehealth (INDEPENDENT_AMBULATORY_CARE_PROVIDER_SITE_OTHER): Payer: BC Managed Care – PPO | Admitting: Family Medicine

## 2020-09-18 DIAGNOSIS — Z20822 Contact with and (suspected) exposure to covid-19: Secondary | ICD-10-CM

## 2020-09-18 DIAGNOSIS — R5381 Other malaise: Secondary | ICD-10-CM | POA: Diagnosis not present

## 2020-09-18 DIAGNOSIS — R5383 Other fatigue: Secondary | ICD-10-CM | POA: Diagnosis not present

## 2020-09-18 MED ORDER — MOLNUPIRAVIR EUA 200MG CAPSULE: 4.0000 | ORAL_CAPSULE | Freq: Two times a day (BID) | ORAL | 0 refills | Status: AC

## 2020-09-18 NOTE — Progress Notes (Signed)
Virtual Visit via Video Note  I connected with Robert Wise on 09/18/20 at 3:11 PM by a video enabled telemedicine application and verified that I am speaking with the correct person using two identifiers.  Patient location:home My location:office - [place]    I discussed the limitations, risks, security and privacy concerns of performing an evaluation and management service by telephone and the availability of in person appointments. I also discussed with the patient that there may be a patient responsible charge related to this service. The patient expressed understanding and agreed to proceed, consent obtained  Chief complaint:  Chief Complaint  Patient presents with   Covid Exposure    Pt reports son positive with covid had lunch together yesterday, reports feeling tired weak today    History of Present Illness: Robert Wise is a 60 y.o. male  Covid 53 exposure: Visiting older son yesterday. Son wasn't feeling well this am - tested positive this am.  Robert Wise is feeling tired, and generally feeling weak today. Similar to what he felt in December of last year when he had covid infection.  Has not tested self, has test at home.  No measured fever. Eating and drinking ok. Not coughing, no dyspnea.   Had [employer] covid vaccine about 6 months ago. Prior to infection.       Patient Active Problem List   Diagnosis Date Noted   Anxiety state 01/25/2018   Fatigue 01/25/2018   Uncontrolled type 2 diabetes mellitus with complication, without long-term current use of insulin ([place]) 02/10/2015   Chronic pain 12/01/2014   Depression 10/12/2014   Past Medical History:  Diagnosis Date   Anxiety    Depression    Diabetes mellitus without complication ([place])    Hyperlipidemia    OSA (obstructive sleep apnea) 12/15/2012   Past Surgical History:  Procedure Laterality Date   ANKLE SURGERY Left 2006   VASECTOMY     Allergies  Allergen Reactions   Tricor [Fenofibrate]     Zocor [Simvastatin]    Prior to Admission medications   Medication Sig Start Date End Date Taking? Authorizing Provider  atorvastatin (LIPITOR) 80 MG tablet TAKE 1 TABLET BY MOUTH EVERY DAY 03/13/20  Yes [name], MD  B Complex Vitamins (B-COMPLEX/B-12 PO) Take by mouth.   Yes [provider]  blood glucose meter kit and supplies KIT Dispense based on patient and insurance preference. Use up to four times daily as directed. (FOR ICD-9 250.00, 250.01). 01/07/19  Yes Stallings, Zoe A, MD  Blood Glucose Monitoring Suppl (ONE TOUCH ULTRA SYSTEM KIT) W/DEVICE KIT 1 kit by Does not apply route once. 10/25/11  Yes Weber, Sarah L, PA-C  busPIRone (BUSPAR) 30 MG tablet TAKE 1 TABLET (30 MG TOTAL) BY MOUTH 2 (TWO) TIMES DAILY. 05/30/20  Yes Donnal Moat T, PA-C  cholecalciferol (VITAMIN D3) 25 MCG (1000 UT) tablet Take 2,000 Units by mouth daily.   Yes [provider]  esomeprazole (NEXIUM) 40 MG capsule Take 1 capsule (40 mg total) by mouth daily. 04/11/17  Yes Jaynee Eagles, PA-C  ferrous sulfate 325 (65 FE) MG tablet Take 65 mg by mouth daily with breakfast.   Yes [provider]  fish oil-omega-3 fatty acids 1000 MG capsule Take 2 g by mouth daily.   Yes [provider]  Ginkgo Biloba Extract 60 MG CAPS Take by mouth.   Yes [provider]  glipiZIDE (GLUCOTROL) 10 MG tablet TAKE 1 TABLET (10 MG TOTAL) BY MOUTH  2 (TWO) TIMES DAILY BEFORE A MEAL. 05/30/20  Yes [name], MD  glucose blood (ONE TOUCH ULTRA TEST) test strip 1 each by Other route daily. 12/01/14  Yes Jaynee Eagles, PA-C  lamoTRIgine (LAMICTAL) 200 MG tablet TAKE 1 TABLET BY MOUTH TWICE A DAY 05/30/20  Yes Hurst, Teresa T, PA-C  Lancets [facility] ULTRASOFT) lancets USE AS INSTRUCTED 11/28/13  Yes [name], MD  levothyroxine (SYNTHROID) 175 MCG tablet TAKE 1 TABLET (175 MCG TOTAL) BY MOUTH DAILY BEFORE BREAKFAST. 05/30/20  Yes [name], MD  lisinopril (ZESTRIL) 5 MG tablet TAKE 1  TABLET BY MOUTH EVERY DAY 05/30/20  Yes [name], MD  PARoxetine (PAXIL) 30 MG tablet TAKE 2 TABLETS BY MOUTH EVERY DAY 08/29/20  Yes Hurst, Teresa T, PA-C  XIGDUO XR 07-998 MG TB24 TAKE 2 TABLETS BY MOUTH EVERY DAY 08/29/20  Yes [name], MD   Social History   Socioeconomic History   Marital status: Married    Spouse name: [name]   Number of children: 3   Years of education: 11.5   Highest education level: Not on file  Occupational History    Employer: [employer]    Comment: Driver  Tobacco Use   Smoking status: Every Day    Packs/day: 1.25    Years: 37.00    Pack years: 46.25    Types: Cigarettes    Start date: 04/02/2019   Smokeless tobacco: Never  Substance and Sexual Activity   Alcohol use: No    Comment: quit: 08/14/2011   Drug use: No   Sexual activity: Yes    Birth control/protection: None  Other Topics Concern   Not on file  Social History Narrative   Patient lives at home with family.    Caffeine Use: 1 pot of coffee daily   Social Determinants of Health   Financial Resource Strain: Not on file  Food Insecurity: Not on file  Transportation Needs: Not on file  Physical Activity: Not on file  Stress: Not on file  Social Connections: Not on file  Intimate Partner Violence: Not on file    Observations/Objective: There were no vitals filed for this visit. Speaking full sentences, no respiratory distress.  Appropriate responses.  Nontoxic appearance over video.  All questions were answered with understanding of plan expressed.  Assessment and Plan: Exposure to confirmed case of COVID-19 - Plan: molnupiravir EUA 200 mg CAPS  Fatigue, unspecified type - Plan: molnupiravir EUA 200 mg CAPS  Malaise - Plan: molnupiravir EUA 200 mg CAPS Close exposure to COVID-19 positive person, now with fatigue/malaise, similar symptoms as the last time he had COVID infection approximately 6 months ago.  Initial [employer] vaccine, but has not received  booster.  Recommended home testing with rapid test tonight or tomorrow morning with repeat testing in 24 to 36 hours.  If he does test positive, then start mulnupiravir - sent rx to his pharmacy.  Potential side effects were discussed.  Discussed need to start within 5 days of onset of symptoms.  Other symptomatic care with saline nasal spray, fluids, Mucinex, Tylenol discussed with ER/urgent care precautions.  2-week follow-up for chronic medical problems.   Follow Up Instructions: As above.    I discussed the assessment and treatment plan with the patient. The patient was provided an opportunity to ask questions and all were answered. The patient agreed with the plan and demonstrated an understanding of the instructions.   The patient was advised to call back  or seek an in-person evaluation if the symptoms worsen or if the condition fails to improve as anticipated.  I provided 16 minutes of non-face-to-face time during this encounter.   [name], MD

## 2020-10-09 ENCOUNTER — Ambulatory Visit: Payer: BC Managed Care – PPO | Admitting: Family Medicine

## 2020-10-09 ENCOUNTER — Ambulatory Visit: Payer: BC Managed Care – PPO | Admitting: Physician Assistant

## 2020-10-23 ENCOUNTER — Ambulatory Visit: Payer: BC Managed Care – PPO | Admitting: Family Medicine

## 2020-11-13 ENCOUNTER — Other Ambulatory Visit: Payer: Self-pay

## 2020-11-13 ENCOUNTER — Ambulatory Visit: Payer: BC Managed Care – PPO | Admitting: Adult Health

## 2020-11-13 ENCOUNTER — Encounter: Payer: Self-pay | Admitting: Adult Health

## 2020-11-13 ENCOUNTER — Telehealth: Payer: Self-pay | Admitting: *Deleted

## 2020-11-13 ENCOUNTER — Ambulatory Visit (INDEPENDENT_AMBULATORY_CARE_PROVIDER_SITE_OTHER): Payer: BC Managed Care – PPO | Admitting: Physician Assistant

## 2020-11-13 ENCOUNTER — Encounter: Payer: Self-pay | Admitting: Physician Assistant

## 2020-11-13 VITALS — BP 147/78 | HR 69 | Ht 72.0 in | Wt 229.8 lb

## 2020-11-13 DIAGNOSIS — G4733 Obstructive sleep apnea (adult) (pediatric): Secondary | ICD-10-CM

## 2020-11-13 DIAGNOSIS — F411 Generalized anxiety disorder: Secondary | ICD-10-CM

## 2020-11-13 DIAGNOSIS — Z9989 Dependence on other enabling machines and devices: Secondary | ICD-10-CM | POA: Diagnosis not present

## 2020-11-13 DIAGNOSIS — F3341 Major depressive disorder, recurrent, in partial remission: Secondary | ICD-10-CM | POA: Diagnosis not present

## 2020-11-13 MED ORDER — PAROXETINE HCL 30 MG PO TABS: 60.0000 mg | ORAL_TABLET | Freq: Every day | ORAL | 1 refills | Status: DC

## 2020-11-13 MED ORDER — BUSPIRONE HCL 30 MG PO TABS: 30.0000 mg | ORAL_TABLET | Freq: Two times a day (BID) | ORAL | 1 refills | Status: DC

## 2020-11-13 MED ORDER — LAMOTRIGINE 200 MG PO TABS: 200.0000 mg | ORAL_TABLET | Freq: Two times a day (BID) | ORAL | 1 refills | Status: DC

## 2020-11-13 NOTE — Patient Instructions (Signed)
Continue using CPAP nightly and greater than 4 hours each night °If your symptoms worsen or you develop new symptoms please let us know.  ° °

## 2020-11-13 NOTE — Telephone Encounter (Signed)
Secure message sent to staff at [facility] for cpap supplies.

## 2020-11-13 NOTE — Progress Notes (Signed)
[facility] Med Check  Patient ID: Robert Wise,  MRN: [id number]  PCP: [name], MD  Date of Evaluation: 11/13/2020 Time spent:20 minutes  Chief Complaint:  Chief Complaint   Depression; Insomnia; Follow-up      HISTORY/CURRENT STATUS: HPI For routine med check.  Doing real well.  He is able to enjoy things.  Energy and motivation are good.  Appetite is normal.  Weight is stable.  Not isolating, work is going very well.  He sleeps good most of the time.  He follows up with his provider for sleep apnea and is using his CPAP as directed.  Not having a lot of anxiety.  He is very appreciative of all the help that he has gotten here at [facility].  Denies suicidal or homicidal thoughts.  Patient denies increased energy with decreased need for sleep, no increased talkativeness, no racing thoughts, no impulsivity or risky behaviors, no increased spending, no increased libido, no grandiosity, no paranoia, and no hallucinations.  Denies dizziness, syncope, seizures, numbness, tingling, tremor, tics, unsteady gait, slurred speech, confusion. Denies muscle or joint pain, stiffness, or dystonia.  Individual Medical History/ Review of Systems: Changes? 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 Use up to  four times daily as directed. (FOR ICD-9 250.00, 250.01)., Disp: 1 each, Rfl: 0   Blood Glucose Monitoring Suppl (ONE TOUCH ULTRA SYSTEM KIT) W/DEVICE KIT, 1 kit by Does not apply route once., Disp: 1 each, Rfl: 0   cholecalciferol (VITAMIN D3) 25 MCG (1000 UT) tablet, Take 2,000 Units by mouth daily., Disp: , Rfl:    esomeprazole (NEXIUM) 40 MG capsule, Take 1 capsule (40 mg total) by mouth daily., Disp: 90 capsule, Rfl: 3   ferrous sulfate 325 (65 FE) MG tablet, Take 65 mg by mouth daily with breakfast., Disp: , Rfl:    fish oil-omega-3 fatty acids 1000 MG capsule, Take 2 g by mouth daily., Disp: , Rfl:    Ginkgo Biloba Extract 60 MG CAPS, Take by mouth., Disp: , Rfl:    glipiZIDE (GLUCOTROL) 10 MG tablet, TAKE 1 TABLET (10 MG TOTAL) BY MOUTH 2 (TWO) TIMES DAILY BEFORE A MEAL., Disp: 180 tablet, Rfl: 1   glucose blood (ONE TOUCH ULTRA TEST) test strip, 1 each by Other route daily., Disp: 100 each, Rfl: 8   Lancets (ONETOUCH ULTRASOFT) lancets, USE AS INSTRUCTED, Disp: 100 each, Rfl: 0   levothyroxine (SYNTHROID) 175 MCG tablet, TAKE 1 TABLET (175 MCG TOTAL) BY MOUTH DAILY BEFORE BREAKFAST., Disp: 90 tablet, Rfl: 3   lisinopril (ZESTRIL) 5 MG tablet, TAKE 1 TABLET BY MOUTH EVERY DAY, Disp: 90 tablet, Rfl: 1   XIGDUO XR 07-998 MG TB24, TAKE 2 TABLETS BY MOUTH EVERY DAY, Disp: 180 tablet, Rfl: 1   busPIRone (BUSPAR) 30 MG tablet, Take 1 tablet (30 mg total) by mouth 2 (two)  times daily., Disp: 180 tablet, Rfl: 1   lamoTRIgine (LAMICTAL) 200 MG tablet, Take 1 tablet (200 mg total) by mouth 2 (two) times daily., Disp: 180 tablet, Rfl: 1   PARoxetine (PAXIL) 30 MG tablet, Take 2 tablets (60 mg total) by mouth daily., Disp: 180 tablet, Rfl: 1 Medication Side Effects: none  Family Medical/ Social History: Changes? No  MENTAL HEALTH EXAM:  There were no vitals taken for this visit.There is no height or weight on file to calculate BMI.  General Appearance: Casual, Neat and Well Groomed  Eye Contact:   Good  Speech:  Clear and Coherent and Normal Rate  Volume:  Normal  Mood:  Euthymic  Affect:  Appropriate  Thought Process:  Goal Directed and Descriptions of Associations: Circumstantial  Orientation:  Full (Time, Place, and Person)  Thought Content: Logical   Suicidal Thoughts:  No  Homicidal Thoughts:  No  Memory:  WNL  Judgement:  Good  Insight:  Good  Psychomotor Activity:  Normal  Concentration:  Concentration: Good  Recall:  Good  Fund of Knowledge: Good  Language: Good  Assets:  Desire for Improvement  ADL's:  Intact  Cognition: WNL  Prognosis:  Good    DIAGNOSES:    ICD-10-CM   1. Recurrent major depressive disorder, in partial remission ([place])  F33.41     2. Generalized anxiety disorder  F41.1     3. Obstructive sleep apnea  G47.33        Receiving Psychotherapy: No    RECOMMENDATIONS:  PDMP was reviewed. I provided 20 minutes of face to face time during this encounter, including time spent before and after the visit in records review, medical decision making, and charting.  I'm glad to see him doing so well.  No changes in medications are necessary. Continue BuSpar 30 mg, 1 p.o. twice daily.  Continue Lamictal 200 mg, 1 p.o. twice daily. Continue Paxil 30 mg, 2 p.o. daily. Continue multivitamin, ginkgo biloba, B complex, fish oil, vitamin D. Continue using CPAP. Return in 6 months.  Donnal Moat, PA-C

## 2020-11-13 NOTE — Progress Notes (Addendum)
PATIENT: Robert Wise DOB: 1960/07/30  REASON FOR VISIT: follow up HISTORY FROM: patient Primary neurologist: Dr. Rexene Alberts  HISTORY OF PRESENT ILLNESS: Today 11/13/20:  Robert Wise is a 60 year old male with a history of obstructive sleep apnea on CPAP.  He returns today for follow-up.  He reports that the CPAP is working well for him.  He has not changed his supplies out in 6 months.  He was not aware that he should change his mask every month.  He does report that the mask leaks at night.  Therefore some nights he is not able to use it much longer than 4 hours.     11/08/19:Robert Wise is a 60 year old male with a history of obstructive sleep apnea on CPAP. He returns today for follow-up. His download indicates that he use his machine 20 studies for compliance of 87%. He uses machine greater than 4 hours 24 days for complete percent. On average he uses his machine 6 hours and 12 minutes. His residual AHI is 3.6 on 14 cm of water with EPR of 1. Leak in the 95th percentile is 84.9 L/min. He reports that he has not changed his mask out recently. He does have a fullface mask. 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  HISTORY 08/19/18:   Robert Wise is a 60 year old male with a history of obstructive sleep apnea on CPAP.  He returns today for a virtual visit.  His download indicates that he uses machine 27 out of 30 days for compliance of 90%.  He uses machine greater than 4 hours 21 out of 30 days for compliance of 70%.  On average he uses his machine 5 hours and 12 minutes.  His residual AHI is 3.2 on 14 cm of water with EPR of 1.  He does not have a significant leak.  He states that the CPAP continues to work well for him.  He denies any significant daytime sleepiness.  He returns today for a virtual visit.  REVIEW OF SYSTEMS: Out of a complete 14 system review of symptoms, the patient complains only of the following symptoms, and all other reviewed systems are  negative. ESS 6  ALLERGIES: Allergies  Allergen Reactions   Tricor [Fenofibrate]    Zocor [Simvastatin]     HOME MEDICATIONS: Outpatient Medications Prior to Visit  Medication Sig Dispense Refill   atorvastatin (LIPITOR) 80 MG tablet TAKE 1 TABLET BY MOUTH EVERY DAY 90 tablet 2   B Complex Vitamins (B-COMPLEX/B-12 PO) Take by mouth.     blood glucose meter kit and supplies KIT Dispense based on patient and insurance preference. Use up to four times daily as directed. (FOR ICD-9 250.00, 250.01). 1 each 0   Blood Glucose Monitoring Suppl (ONE TOUCH ULTRA SYSTEM KIT) W/DEVICE KIT 1 kit by Does not apply route once. 1 each 0   busPIRone (BUSPAR) 30 MG tablet TAKE 1 TABLET (30 MG TOTAL) BY MOUTH 2 (TWO) TIMES DAILY. 180 tablet 1   cholecalciferol (VITAMIN D3) 25 MCG (1000 UT) tablet Take 2,000 Units by mouth daily.     esomeprazole (NEXIUM) 40 MG capsule Take 1 capsule (40 mg total) by mouth daily. 90 capsule 3   ferrous sulfate 325 (65 FE) MG tablet Take 65 mg by mouth daily with breakfast.     fish oil-omega-3 fatty acids 1000 MG capsule Take 2 g by mouth daily.     Ginkgo Biloba Extract 60 MG CAPS Take by  mouth.     glipiZIDE (GLUCOTROL) 10 MG tablet TAKE 1 TABLET (10 MG TOTAL) BY MOUTH 2 (TWO) TIMES DAILY BEFORE A MEAL. 180 tablet 1   glucose blood (ONE TOUCH ULTRA TEST) test strip 1 each by Other route daily. 100 each 8   lamoTRIgine (LAMICTAL) 200 MG tablet TAKE 1 TABLET BY MOUTH TWICE A DAY 180 tablet 1   Lancets (ONETOUCH ULTRASOFT) lancets USE AS INSTRUCTED 100 each 0   levothyroxine (SYNTHROID) 175 MCG tablet TAKE 1 TABLET (175 MCG TOTAL) BY MOUTH DAILY BEFORE BREAKFAST. 90 tablet 3   lisinopril (ZESTRIL) 5 MG tablet TAKE 1 TABLET BY MOUTH EVERY DAY 90 tablet 1   PARoxetine (PAXIL) 30 MG tablet TAKE 2 TABLETS BY MOUTH EVERY DAY 180 tablet 0   XIGDUO XR 07-998 MG TB24 TAKE 2 TABLETS BY MOUTH EVERY DAY 180 tablet 1   No facility-administered medications prior to visit.    PAST  MEDICAL HISTORY: Past Medical History:  Diagnosis Date   Anxiety    Depression    Diabetes mellitus without complication (HCC)    Hyperlipidemia    OSA (obstructive sleep apnea) 12/15/2012   Sleep apnea     PAST SURGICAL HISTORY: Past Surgical History:  Procedure Laterality Date   ANKLE SURGERY Left 2006   VASECTOMY      FAMILY HISTORY: Family History  Problem Relation Age of Onset   Sleep apnea Neg Hx     SOCIAL HISTORY: Social History   Socioeconomic History   Marital status: Married    Spouse name: [name]   Number of children: 3   Years of education: 11.5   Highest education level: Not on file  Occupational History    Employer: [employer]    Comment: Driver  Tobacco Use   Smoking status: Every Day    Packs/day: 1.25    Years: 37.00    Pack years: 46.25    Types: Cigarettes    Start date: 04/02/2019   Smokeless tobacco: Never  Vaping Use   Vaping Use: Never used  Substance and Sexual Activity   Alcohol use: No    Comment: quit: 08/14/2011   Drug use: No   Sexual activity: Yes    Birth control/protection: None  Other Topics Concern   Not on file  Social History Narrative   Patient lives at home with family.    Caffeine Use: 1 pot of coffee daily   Social Determinants of Health   Financial Resource Strain: Not on file  Food Insecurity: Not on file  Transportation Needs: Not on file  Physical Activity: Not on file  Stress: Not on file  Social Connections: Not on file  Intimate Partner Violence: Not on file      PHYSICAL EXAM  Vitals:   11/13/20 0822  BP: (!) 147/78  Pulse: 69  Weight: 229 lb 12.8 oz (104.2 kg)  Height: 6' (1.829 m)   Body mass index is 31.17 kg/m.  Generalized: Well developed, in no acute distress  Chest: Lungs clear to auscultation bilaterally  Neurological examination  Mentation: Alert oriented to time, place, history taking. Follows all commands speech and language fluent Cranial nerve II-XII: Extraocular  movements were full, visual field were full on confrontational test Head turning and shoulder shrug  were normal and symmetric. Motor: The motor testing reveals 5 over 5 strength of all 4 extremities. Good symmetric motor tone is noted throughout.  Sensory: Sensory testing is intact to soft touch on all 4 extremities. No evidence  of extinction is noted.  Gait and station: Gait is normal.    DIAGNOSTIC DATA (LABS, IMAGING, TESTING) - I reviewed patient records, labs, notes, testing and imaging myself where available.  Lab Results  Component Value Date   WBC 9.8 06/02/2017   HGB 14.7 06/02/2017   HCT 42.6 06/02/2017   MCV 81 06/02/2017   PLT 253 06/02/2017      Component Value Date/Time   NA 142 04/26/2019 0848   K 4.6 04/26/2019 0848   CL 103 04/26/2019 0848   CO2 21 04/26/2019 0848   GLUCOSE 123 (H) 04/26/2019 0848   GLUCOSE 136 (H) 12/29/2015 0909   BUN 17 04/26/2019 0848   CREATININE 0.81 04/26/2019 0848   CREATININE 0.72 12/29/2015 0909   CALCIUM 9.7 04/26/2019 0848   PROT 7.4 11/08/2019 1024   ALBUMIN 4.9 11/08/2019 1024   AST 14 11/08/2019 1024   ALT 15 11/08/2019 1024   ALKPHOS 93 11/08/2019 1024   BILITOT <0.2 11/08/2019 1024   GFRNONAA 98 04/26/2019 0848   GFRNONAA >89 07/28/2015 0847   GFRAA 113 04/26/2019 0848   GFRAA >89 07/28/2015 0847   Lab Results  Component Value Date   CHOL 139 12/20/2019   HDL 26 (L) 12/20/2019   LDLCALC 77 12/20/2019   TRIG 217 (H) 12/20/2019   CHOLHDL 5.3 (H) 12/20/2019   Lab Results  Component Value Date   HGBA1C 8.6 (H) 11/08/2019   Lab Results  Component Value Date   VITAMINB12 747 06/02/2017   Lab Results  Component Value Date   TSH 1.330 12/20/2019      ASSESSMENT AND PLAN 60 y.o. year old male  has a past medical history of Anxiety, Depression, Diabetes mellitus without complication ([place]), Hyperlipidemia, OSA (obstructive sleep apnea) (12/15/2012), and Sleep apnea. here with:  1. Obstructive sleep apnea on  CPAP  Mildly suboptimal 4 h compliance Reasonable treatment of his apnea Patient was encouraged to change out his mask monthly to avoid the mask leaking. Encouraged the patient to use greater than 4 hours each night Follow-up in 1 year or sooner if needed    Robert Givens, MSN, NP-C 11/13/2020, 8:34 AM [facility] [address], [place], [place] [address] [phone]  I reviewed the above note and documentation by the Nurse Practitioner and agree with the history, exam, assessment and plan as outlined above. I was available for consultation. Star Age, MD, PhD [facility])

## 2020-11-21 ENCOUNTER — Other Ambulatory Visit: Payer: Self-pay | Admitting: Family Medicine

## 2020-11-21 DIAGNOSIS — B356 Tinea cruris: Secondary | ICD-10-CM

## 2020-11-23 ENCOUNTER — Other Ambulatory Visit: Payer: Self-pay | Admitting: Family Medicine

## 2020-11-23 DIAGNOSIS — E1165 Type 2 diabetes mellitus with hyperglycemia: Secondary | ICD-10-CM

## 2020-11-27 ENCOUNTER — Other Ambulatory Visit: Payer: Self-pay

## 2020-11-27 ENCOUNTER — Ambulatory Visit: Payer: BC Managed Care – PPO | Admitting: Family Medicine

## 2020-11-27 VITALS — BP 134/72 | HR 73 | Temp 98.3°F | Resp 17 | Ht 72.0 in | Wt 228.0 lb

## 2020-11-27 DIAGNOSIS — R002 Palpitations: Secondary | ICD-10-CM | POA: Diagnosis not present

## 2020-11-27 DIAGNOSIS — E039 Hypothyroidism, unspecified: Secondary | ICD-10-CM | POA: Diagnosis not present

## 2020-11-27 DIAGNOSIS — E782 Mixed hyperlipidemia: Secondary | ICD-10-CM

## 2020-11-27 DIAGNOSIS — R011 Cardiac murmur, unspecified: Secondary | ICD-10-CM

## 2020-11-27 DIAGNOSIS — Z23 Encounter for immunization: Secondary | ICD-10-CM

## 2020-11-27 DIAGNOSIS — E1165 Type 2 diabetes mellitus with hyperglycemia: Secondary | ICD-10-CM

## 2020-11-27 LAB — COMPREHENSIVE METABOLIC PANEL
ALT: 14 U/L (ref 0–53)
AST: 13 U/L (ref 0–37)
Albumin: 4.2 g/dL (ref 3.5–5.2)
Alkaline Phosphatase: 87 U/L (ref 39–117)
BUN: 11 mg/dL (ref 6–23)
CO2: 25 mEq/L (ref 19–32)
Calcium: 8.8 mg/dL (ref 8.4–10.5)
Chloride: 101 mEq/L (ref 96–112)
Creatinine, Ser: 0.87 mg/dL (ref 0.40–1.50)
GFR: 93.96 mL/min (ref >60.00)
Glucose, Bld: 175 mg/dL — ABNORMAL HIGH (ref 70–99)
Potassium: 4.3 mEq/L (ref 3.5–5.1)
Sodium: 136 mEq/L (ref 135–145)
Total Bilirubin: 0.4 mg/dL (ref 0.2–1.2)
Total Protein: 6.7 g/dL (ref 6.0–8.3)

## 2020-11-27 LAB — LIPID PANEL
Cholesterol: 144 mg/dL (ref 0–200)
HDL: 24.7 mg/dL — ABNORMAL LOW (ref >39.00)
NonHDL: 118.98
Total CHOL/HDL Ratio: 6
Triglycerides: 390 mg/dL — ABNORMAL HIGH (ref 0.0–149.0)
VLDL: 78 mg/dL — ABNORMAL HIGH (ref 0.0–40.0)

## 2020-11-27 LAB — LDL CHOLESTEROL, DIRECT: Direct LDL: 78 mg/dL

## 2020-11-27 LAB — TSH: TSH: 0.83 u[IU]/mL (ref 0.35–5.50)

## 2020-11-27 LAB — HEMOGLOBIN A1C: Hgb A1c MFr Bld: 9.4 % — ABNORMAL HIGH (ref 4.6–6.5)

## 2020-11-27 MED ORDER — GLIPIZIDE 10 MG PO TABS: 10.0000 mg | ORAL_TABLET | Freq: Two times a day (BID) | ORAL | 1 refills | Status: DC

## 2020-11-27 MED ORDER — ATORVASTATIN CALCIUM 80 MG PO TABS: 80.0000 mg | ORAL_TABLET | Freq: Every day | ORAL | 2 refills | Status: DC

## 2020-11-27 MED ORDER — XIGDUO XR 5-1000 MG PO TB24: 2.0000 | ORAL_TABLET | Freq: Every day | ORAL | 1 refills | Status: DC

## 2020-11-27 MED ORDER — LISINOPRIL 5 MG PO TABS: 5.0000 mg | ORAL_TABLET | Freq: Every day | ORAL | 1 refills | Status: DC

## 2020-11-27 MED ORDER — LEVOTHYROXINE SODIUM 175 MCG PO TABS: 175.0000 ug | ORAL_TABLET | Freq: Every day | ORAL | 3 refills | Status: DC

## 2020-11-27 NOTE — Progress Notes (Signed)
Subjective:  Patient ID: Robert Wise, male    DOB: [birth year]  Age: 60 y.o. MRN: [id number]  CC:  Chief Complaint  Patient presents with   Palpitations    Pt reports having chest tightness, racing heart feels it skips a beat. 1 month    Diabetes    Pt here to recheck labs work, no concerns needs refills    Hypertension    Pt here for recheck today as well as lab work     HPI Robert Wise presents for   Palpitations  Past 1 month. Noticed fluttering at times, then pauses and restarts. Lightheaded at times, palpitations daily. No syncope.no chest pain/palpitations. Expresso shots - 2 per day - no recent changes.no alcohol.  Smoker 1.5 packs per day.  Symptoms about every day. Notices in bed at times. Not associated with stress or any specific activities.   History of hypothyroidism treated with Synthroid 175 mcg daily.  No recent labs. Lab Results  Component Value Date   TSH 0.83 11/27/2020  Taking medication daily.  No new hot or cold intolerance. No new hair or skin changes, or new fatigue. No new weight changes. Palpitations as above.    History of depression, treated by [facility], with BuSpar 30 mg twice daily, Lamictal 200 mg twice daily, Paxil 30 mg 2/day.  Last visit August 15, no med changes.  Hypertension: Lisinopril 5 mg daily.  History of OSA on CPAP. Using CPAP, misses on weekends at times - 85% use.  Home readings: none.  BP Readings from Last 3 Encounters:  11/27/20 134/72  11/13/20 (!) 147/78  01/28/20 136/72   Lab Results  Component Value Date   CREATININE 0.87 11/27/2020   Diabetes: With hyperglycemia, microalbuminuria. Treated with Xigduo XR 12/1998 mg/day.  Glipizide 5 mg twice daily.  Statin, ACE inhibitor.  No recent evaluation, last labs in August 2021.  Uncontrolled at that time.  2-week follow-up recommended. Home readings: around 200. No postprandial readings.  No sx lows.  Microalbumin: elevated ratio 38 on 01/2019.   Optho, foot exam, pneumovax:prevnar 20 today.   Lab Results  Component Value Date   HGBA1C 9.4 (H) 11/27/2020   HGBA1C 8.6 (H) 11/08/2019   HGBA1C 8.3 (H) 02/18/2019   Lab Results  Component Value Date   MICROALBUR 2.1 (H) 08/01/2014   LDLCALC 77 12/20/2019   CREATININE 0.87 11/27/2020     History Patient Active Problem List   Diagnosis Date Noted   Anxiety state 01/25/2018   Fatigue 01/25/2018   Uncontrolled type 2 diabetes mellitus with complication, without long-term current use of insulin ([place]) 02/10/2015   Chronic pain 12/01/2014   Depression 10/12/2014   Past Medical History:  Diagnosis Date   Anxiety    Depression    Diabetes mellitus without complication ([place])    Hyperlipidemia    OSA (obstructive sleep apnea) 12/15/2012   Sleep apnea    Past Surgical History:  Procedure Laterality Date   ANKLE SURGERY Left 2006   VASECTOMY     Allergies  Allergen Reactions   Tricor [Fenofibrate]    Zocor [Simvastatin]    Prior to Admission medications   Medication Sig Start Date End Date Taking? Authorizing Provider  atorvastatin (LIPITOR) 80 MG tablet TAKE 1 TABLET BY MOUTH EVERY DAY 03/13/20  Yes [name], MD  B Complex Vitamins (B-COMPLEX/B-12 PO) Take by mouth.   Yes [provider]  blood glucose meter kit and supplies KIT Dispense based on patient  and insurance preference. Use up to four times daily as directed. (FOR ICD-9 250.00, 250.01). 01/07/19  Yes Stallings, Zoe A, MD  Blood Glucose Monitoring Suppl (ONE TOUCH ULTRA SYSTEM KIT) W/DEVICE KIT 1 kit by Does not apply route once. 10/25/11  Yes Weber, Sarah L, PA-C  busPIRone (BUSPAR) 30 MG tablet Take 1 tablet (30 mg total) by mouth 2 (two) times daily. 11/13/20  Yes Donnal Moat T, PA-C  cholecalciferol (VITAMIN D3) 25 MCG (1000 UT) tablet Take 2,000 Units by mouth daily.   Yes [provider]  esomeprazole (NEXIUM) 40 MG capsule Take 1 capsule (40 mg total) by mouth daily. 04/11/17  Yes  Jaynee Eagles, PA-C  ferrous sulfate 325 (65 FE) MG tablet Take 65 mg by mouth daily with breakfast.   Yes [provider]  fish oil-omega-3 fatty acids 1000 MG capsule Take 2 g by mouth daily.   Yes [provider]  Ginkgo Biloba Extract 60 MG CAPS Take by mouth.   Yes [provider]  glipiZIDE (GLUCOTROL) 10 MG tablet TAKE 1 TABLET (10 MG TOTAL) BY MOUTH 2 (TWO) TIMES DAILY BEFORE A MEAL. 11/23/20  Yes [name], MD  glucose blood (ONE TOUCH ULTRA TEST) test strip 1 each by Other route daily. 12/01/14  Yes Jaynee Eagles, PA-C  lamoTRIgine (LAMICTAL) 200 MG tablet Take 1 tablet (200 mg total) by mouth 2 (two) times daily. 11/13/20  Yes Hurst, Helene Kelp T, PA-C  Lancets [facility] ULTRASOFT) lancets USE AS INSTRUCTED 11/28/13  Yes [name], MD  levothyroxine (SYNTHROID) 175 MCG tablet TAKE 1 TABLET (175 MCG TOTAL) BY MOUTH DAILY BEFORE BREAKFAST. 05/30/20  Yes [name], MD  lisinopril (ZESTRIL) 5 MG tablet TAKE 1 TABLET BY MOUTH EVERY DAY 11/23/20  Yes [name], MD  PARoxetine (PAXIL) 30 MG tablet Take 2 tablets (60 mg total) by mouth daily. 11/13/20  Yes Hurst, Teresa T, PA-C  XIGDUO XR 07-998 MG TB24 TAKE 2 TABLETS BY MOUTH EVERY DAY 08/29/20  Yes [name], MD   Social History   Socioeconomic History   Marital status: Married    Spouse name: [name]   Number of children: 3   Years of education: 11.5   Highest education level: Not on file  Occupational History    Employer: [employer]    Comment: Driver  Tobacco Use   Smoking status: Every Day    Packs/day: 1.25    Years: 37.00    Pack years: 46.25    Types: Cigarettes    Start date: 04/02/2019   Smokeless tobacco: Never  Vaping Use   Vaping Use: Never used  Substance and Sexual Activity   Alcohol use: No    Comment: quit: 08/14/2011   Drug use: No   Sexual activity: Yes    Birth control/protection: None  Other Topics Concern   Not on file  Social History Narrative    Patient lives at home with family.    Caffeine Use: 1 pot of coffee daily   Social Determinants of Health   Financial Resource Strain: Not on file  Food Insecurity: Not on file  Transportation Needs: Not on file  Physical Activity: Not on file  Stress: Not on file  Social Connections: Not on file  Intimate Partner Violence: Not on file    Review of Systems Per HPI.   Objective:   Vitals:   11/27/20 1327  BP: 134/72  Pulse: 73  Resp: 17  Temp: 98.3 F (36.8 C)  TempSrc: Temporal  SpO2: 94%  Weight: 228 lb (103.4 kg)  Height: 6' (1.829 m)     Physical Exam Vitals reviewed.  Constitutional:      Appearance: He is well-developed.  HENT:     Head: Normocephalic and atraumatic.  Neck:     Vascular: No carotid bruit or JVD.  Cardiovascular:     Rate and Rhythm: Normal rate and regular rhythm.     Heart sounds: Murmur (2/6 Systolic) heard.  Pulmonary:     Effort: Pulmonary effort is normal.     Breath sounds: Normal breath sounds. No rales.  Musculoskeletal:     Right lower leg: No edema.     Left lower leg: No edema.  Skin:    General: Skin is warm and dry.  Neurological:     Mental Status: He is alert and oriented to person, place, and time.  Psychiatric:        Mood and Affect: Mood normal.    EKG, sinus rhythm, rate 60.  No apparent ectopy.  No apparent ST or T wave changes or acute findings.  Compared to 09/24/2013, no significant changes.   Assessment & Plan:  OREE HISLOP is a 60 y.o. male . Heart palpitations - Plan: EKG 12-Lead, Ambulatory referral to Cardiology  -Check TSH, CBC, reassuring EKG.  Decrease caffeine by half.  Refer to cardiology.  Advised against driving due to reported symptoms of lightheadedness.  ER precautions if chest pain/tightness or near syncopal symptoms.  Type 2 diabetes mellitus with hyperglycemia, without long-term current use of insulin (H[facility]) - Plan: glipiZIDE (GLUCOTROL) 10 MG tablet, lisinopril (ZESTRIL) 5 MG tablet,  Dapagliflozin-metFORMIN HCl ER (XIGDUO XR) 07-998 MG TB24, Comprehensive metabolic panel, Hemoglobin A1c  -Check labs, continue same meds for now.  Follow-up  intervals discussed for diabetes  Hypothyroidism, unspecified type - Plan: levothyroxine (SYNTHROID) 175 MCG tablet, TSH  -Palpitations as above, check TSH, continue Synthroid same dose for now  Mixed hyperlipidemia - Plan: atorvastatin (LIPITOR) 80 MG tablet, Lipid panel  -Tolerating Lipitor, continue same, check labs  Need for pneumococcal vaccination - Plan: Pneumococcal conjugate vaccine 20-valent (Prevnar 20)  Heart murmur - Plan: Ambulatory referral to Cardiology  -New problem, rate but noted systolic heart murmur.  Refer to cardiology as above, likely will obtain echo.  Meds ordered this encounter  Medications   glipiZIDE (GLUCOTROL) 10 MG tablet    Sig: Take 1 tablet (10 mg total) by mouth 2 (two) times daily before a meal.    Dispense:  180 tablet    Refill:  1   lisinopril (ZESTRIL) 5 MG tablet    Sig: Take 1 tablet (5 mg total) by mouth daily.    Dispense:  90 tablet    Refill:  1   levothyroxine (SYNTHROID) 175 MCG tablet    Sig: Take 1 tablet (175 mcg total) by mouth daily before breakfast.    Dispense:  90 tablet    Refill:  3   atorvastatin (LIPITOR) 80 MG tablet    Sig: Take 1 tablet (80 mg total) by mouth daily.    Dispense:  90 tablet    Refill:  2   Dapagliflozin-metFORMIN HCl ER (XIGDUO XR) 07-998 MG TB24    Sig: Take 2 tablets by mouth daily.    Dispense:  180 tablet    Refill:  1   Patient Instructions  See information below regarding heart palpitations.  Because those have caused lightheadedness before, I recommend against driving until we can  evaluate those further.  Decrease caffeine intake by half.  I have placed an urgent referral to see cardiology as well as check some lab work today.  They can also do some testing for the heart murmur although that is very faint today.  If you have return of  heart palpitations that cause dizziness or lightheadedness, please be seen through the emergency room.  I will also check your labs for blood pressure, cholesterol, diabetes and can follow-up to discuss those and medication plan in 2 weeks.  I will let you know if changes needed prior to that time.  Return to the clinic or go to the nearest emergency room if any of your symptoms worsen or new symptoms occur.  Heart Murmur A heart murmur is an extra sound that is caused by chaotic blood flow through the valves of the heart. The murmur can be heard as a "hum" or "whoosh" soundwhen blood flows through the heart. There are two types of heart murmurs: Innocent (benign) murmurs. Most people with this type of heart murmur do not have a heart problem. Many children have innocent heart murmurs. Your health care provider may suggest some basic tests to find out whether your murmur is an innocent murmur. If an innocent heart murmur is found, there is no need for further tests or treatment and no need to restrict activities or stop playing sports. Abnormal murmurs. These types of murmurs can occur in children and adults. Abnormal murmurs may be a sign of a more serious heart condition, such as a heart defect present at birth (congenital defect) or heart valve disease. What are the causes?  The heart has four areas called chambers. Valves separate the upper and lower chambers from each other (tricuspid valve and mitral valve) and separate the lower chambers of the heart from pathways that lead away from the heart (aortic valve and pulmonary valve). Normally, the valves open to let blood flow through or out of your heart, and then they shut to keep the blood from flowing backward. This condition is caused by heart valves that are not working properly. In children, abnormal heart murmurs are typically caused by congenital defects. In adults, abnormal murmurs are usually caused by heart valve problems from disease,  infection, or aging. This condition may also be caused by: Pregnancy. Fever. Overactive thyroid gland. Anemia. Exercise. Rapid growth spurts (in children). What are the signs or symptoms? Innocent murmurs do not cause symptoms, and many people with abnormal murmurs may not have symptoms. If symptoms do develop, they may include: Shortness of breath. Blue coloring of the skin, especially on the fingertips. Chest pain. Palpitations, or feeling a fluttering or skipped heartbeat. Fainting. Persistent cough. Getting tired much faster than expected. Swelling in the abdomen, feet, or ankles. How is this diagnosed? This condition may be diagnosed during a routine physical or other exam. If your health care provider hears a murmur with a stethoscope, he or she will listen for: Where the murmur is located in your heart. How long the murmur lasts (duration). When the murmur is heard during the heartbeat. How loud the murmur is. This may help the health care provider figure out what is causing the murmur. You may be referred to a heart specialist (cardiologist). You may also have other tests, including: Electrocardiogram (ECG or EKG). This test measures the electrical activity of your heart. Echocardiogram. This test uses high frequency sound waves to make pictures of your heart. MRI or chest X-ray. Cardiac catheterization. This test  looks at blood flow through the arteries around the heart. For children and adults who have an abnormal heart murmur and want to stay active, it is important to: Complete testing. Review test results. Receive recommendations from your health care provider. If heart disease is present, it may not be safe to play or be active. How is this treated? Heart murmurs themselves do not need treatment. In some cases, a heart murmur may go away on its own. If an underlying problem or disease is causing the murmur, you may need treatment. If treatment is needed, it will  depend on the type and severity of the disease or heart problem causing the murmur. Treatment may include: Medicine. Surgery. Dietary and lifestyle changes. Follow these instructions at home: Talk with your health care provider before participating in sports or other activities that require a lot of effort and energy (are strenuous). Learn as much as possible about your condition and any related diseases. Ask your health care provider if you may be at risk for any medical emergencies. Talk with your health care provider about what symptoms you should look out for. It is up to you to get your test results. Ask your health care provider, or the department that is doing the test, when your results will be ready. Keep all follow-up visits as told by your health care provider. This is important. Contact a health care provider if: You are frequently short of breath. You feel more tired than usual. You are having a hard time keeping up with normal activities or fitness routines. You have swelling in your ankles or feet. You notice that your heart often beats irregularly. You develop any new symptoms. Get help right away if: You have chest pain. You are having trouble breathing. You feel light-headed or you pass out. Your symptoms suddenly get worse. These symptoms may represent a serious problem that is an emergency. Do not wait to see if the symptoms will go away. Get medical help right away. Call your local emergency services (911 in the [place] Do not drive yourself to the hospital. Summary Normally, the heart valves open to let blood flow through or out of your heart, and then they shut to keep the blood from flowing backward. A heart murmur is caused by heart valves that are not working properly. You may need treatment if an underlying problem or disease is causing the heart murmur. Treatment may include medicine, surgery, or dietary and lifestyle changes. Talk with your health care provider  before participating in sports or other activities that require a lot of effort and energy (are strenuous). Talk with your health care provider about what symptoms you should watch out for. This information is not intended to replace advice given to you by your health care provider. Make sure you discuss any questions you have with your healthcare provider. Document Revised: 03/03/2020 Document Reviewed: 09/09/2017 Elsevier Patient Education  2022 [place].   Palpitations Palpitations are feelings that your heartbeat is irregular or is faster than normal. It may feel like your heart is fluttering or skipping a beat. Palpitations are usually not a serious problem. They may be caused by many things, including smoking, caffeine, alcohol, stress, and certain medicines or drugs. Most causes of palpitations are not serious. However, some palpitations can be a sign of a serious problem. You may need further tests to rule outserious medical problems. Follow these instructions at home:     Pay attention to any changes in your condition. Take  these actions to helpmanage your symptoms: Eating and drinking Avoid foods and drinks that may cause palpitations. These may include: Caffeinated coffee, tea, soft drinks, diet pills, and energy drinks. Chocolate. Alcohol. Lifestyle Take steps to reduce your stress and anxiety. Things that can help you relax include: Yoga. Mind-body activities, such as deep breathing, meditation, or using words and images to create positive thoughts (guided imagery). Physical activity, such as swimming, jogging, or walking. Tell your health care provider if your palpitations increase with activity. If you have chest pain or shortness of breath with activity, do not continue the activity until you are seen by your health care provider. Biofeedback. This is a method that helps you learn to use your mind to control things in your body, such as your heartbeat. Do not use drugs,  including cocaine or ecstasy. Do not use marijuana. Get plenty of rest and sleep. Keep a regular bed time. General instructions Take over-the-counter and prescription medicines only as told by your health care provider. Do not use any products that contain nicotine or tobacco, such as cigarettes and e-cigarettes. If you need help quitting, ask your health care provider. Keep all follow-up visits as told by your health care provider. This is important. These may include visits for further testing if palpitations do not go away or get worse. Contact a health care provider if you: Continue to have a fast or irregular heartbeat after 24 hours. Notice that your palpitations occur more often. Get help right away if you: Have chest pain or shortness of breath. Have a severe headache. Feel dizzy or you faint. Summary Palpitations are feelings that your heartbeat is irregular or is faster than normal. It may feel like your heart is fluttering or skipping a beat. Palpitations may be caused by many things, including smoking, caffeine, alcohol, stress, certain medicines, and drugs. Although most causes of palpitations are not serious, some causes can be a sign of a serious medical problem. Get help right away if you faint or have chest pain, shortness of breath, a severe headache, or dizziness. This information is not intended to replace advice given to you by your health care provider. Make sure you discuss any questions you have with your healthcare provider. Document Revised: 04/30/2017 Document Reviewed: 04/30/2017 Elsevier Patient Education  2022 [place],   Merri Ray, MD [facility], [facility] 11/28/20 1:22 PM

## 2020-11-27 NOTE — Patient Instructions (Addendum)
See information below regarding heart palpitations.  Because those have caused lightheadedness before, I recommend against driving until we can evaluate those further.  Decrease caffeine intake by half.  I have placed an urgent referral to see cardiology as well as check some lab work today.  They can also do some testing for the heart murmur although that is very faint today.  If you have return of heart palpitations that cause dizziness or lightheadedness, please be seen through the emergency room.  I will also check your labs for blood pressure, cholesterol, diabetes and can follow-up to discuss those and medication plan in 2 weeks.  I will let you know if changes needed prior to that time.  Return to the clinic or go to the nearest emergency room if any of your symptoms worsen or new symptoms occur.  Heart Murmur A heart murmur is an extra sound that is caused by chaotic blood flow through the valves of the heart. The murmur can be heard as a "hum" or "whoosh" soundwhen blood flows through the heart. There are two types of heart murmurs: Innocent (benign) murmurs. Most people with this type of heart murmur do not have a heart problem. Many children have innocent heart murmurs. Your health care provider may suggest some basic tests to find out whether your murmur is an innocent murmur. If an innocent heart murmur is found, there is no need for further tests or treatment and no need to restrict activities or stop playing sports. Abnormal murmurs. These types of murmurs can occur in children and adults. Abnormal murmurs may be a sign of a more serious heart condition, such as a heart defect present at birth (congenital defect) or heart valve disease. What are the causes?  The heart has four areas called chambers. Valves separate the upper and lower chambers from each other (tricuspid valve and mitral valve) and separate the lower chambers of the heart from pathways that lead away from the heart  (aortic valve and pulmonary valve). Normally, the valves open to let blood flow through or out of your heart, and then they shut to keep the blood from flowing backward. This condition is caused by heart valves that are not working properly. In children, abnormal heart murmurs are typically caused by congenital defects. In adults, abnormal murmurs are usually caused by heart valve problems from disease, infection, or aging. This condition may also be caused by: Pregnancy. Fever. Overactive thyroid gland. Anemia. Exercise. Rapid growth spurts (in children). What are the signs or symptoms? Innocent murmurs do not cause symptoms, and many people with abnormal murmurs may not have symptoms. If symptoms do develop, they may include: Shortness of breath. Blue coloring of the skin, especially on the fingertips. Chest pain. Palpitations, or feeling a fluttering or skipped heartbeat. Fainting. Persistent cough. Getting tired much faster than expected. Swelling in the abdomen, feet, or ankles. How is this diagnosed? This condition may be diagnosed during a routine physical or other exam. If your health care provider hears a murmur with a stethoscope, he or she will listen for: Where the murmur is located in your heart. How long the murmur lasts (duration). When the murmur is heard during the heartbeat. How loud the murmur is. This may help the health care provider figure out what is causing the murmur. You may be referred to a heart specialist (cardiologist). You may also have other tests, including: Electrocardiogram (ECG or EKG). This test measures the electrical activity of your heart. Echocardiogram. This test  uses high frequency sound waves to make pictures of your heart. MRI or chest X-ray. Cardiac catheterization. This test looks at blood flow through the arteries around the heart. For children and adults who have an abnormal heart murmur and want to stay active, it is important  to: Complete testing. Review test results. Receive recommendations from your health care provider. If heart disease is present, it may not be safe to play or be active. How is this treated? Heart murmurs themselves do not need treatment. In some cases, a heart murmur may go away on its own. If an underlying problem or disease is causing the murmur, you may need treatment. If treatment is needed, it will depend on the type and severity of the disease or heart problem causing the murmur. Treatment may include: Medicine. Surgery. Dietary and lifestyle changes. Follow these instructions at home: Talk with your health care provider before participating in sports or other activities that require a lot of effort and energy (are strenuous). Learn as much as possible about your condition and any related diseases. Ask your health care provider if you may be at risk for any medical emergencies. Talk with your health care provider about what symptoms you should look out for. It is up to you to get your test results. Ask your health care provider, or the department that is doing the test, when your results will be ready. Keep all follow-up visits as told by your health care provider. This is important. Contact a health care provider if: You are frequently short of breath. You feel more tired than usual. You are having a hard time keeping up with normal activities or fitness routines. You have swelling in your ankles or feet. You notice that your heart often beats irregularly. You develop any new symptoms. Get help right away if: You have chest pain. You are having trouble breathing. You feel light-headed or you pass out. Your symptoms suddenly get worse. These symptoms may represent a serious problem that is an emergency. Do not wait to see if the symptoms will go away. Get medical help right away. Call your local emergency services (911 in the [place] Do not drive yourself to the  hospital. Summary Normally, the heart valves open to let blood flow through or out of your heart, and then they shut to keep the blood from flowing backward. A heart murmur is caused by heart valves that are not working properly. You may need treatment if an underlying problem or disease is causing the heart murmur. Treatment may include medicine, surgery, or dietary and lifestyle changes. Talk with your health care provider before participating in sports or other activities that require a lot of effort and energy (are strenuous). Talk with your health care provider about what symptoms you should watch out for. This information is not intended to replace advice given to you by your health care provider. Make sure you discuss any questions you have with your healthcare provider. Document Revised: 03/03/2020 Document Reviewed: 09/09/2017 Elsevier Patient Education  2022 [employer]   Palpitations Palpitations are feelings that your heartbeat is irregular or is faster than normal. It may feel like your heart is fluttering or skipping a beat. Palpitations are usually not a serious problem. They may be caused by many things, including smoking, caffeine, alcohol, stress, and certain medicines or drugs. Most causes of palpitations are not serious. However, some palpitations can be a sign of a serious problem. You may need further tests to rule outserious medical  problems. Follow these instructions at home:     Pay attention to any changes in your condition. Take these actions to helpmanage your symptoms: Eating and drinking Avoid foods and drinks that may cause palpitations. These may include: Caffeinated coffee, tea, soft drinks, diet pills, and energy drinks. Chocolate. Alcohol. Lifestyle Take steps to reduce your stress and anxiety. Things that can help you relax include: Yoga. Mind-body activities, such as deep breathing, meditation, or using words and images to create positive thoughts  (guided imagery). Physical activity, such as swimming, jogging, or walking. Tell your health care provider if your palpitations increase with activity. If you have chest pain or shortness of breath with activity, do not continue the activity until you are seen by your health care provider. Biofeedback. This is a method that helps you learn to use your mind to control things in your body, such as your heartbeat. Do not use drugs, including cocaine or ecstasy. Do not use marijuana. Get plenty of rest and sleep. Keep a regular bed time. General instructions Take over-the-counter and prescription medicines only as told by your health care provider. Do not use any products that contain nicotine or tobacco, such as cigarettes and e-cigarettes. If you need help quitting, ask your health care provider. Keep all follow-up visits as told by your health care provider. This is important. These may include visits for further testing if palpitations do not go away or get worse. Contact a health care provider if you: Continue to have a fast or irregular heartbeat after 24 hours. Notice that your palpitations occur more often. Get help right away if you: Have chest pain or shortness of breath. Have a severe headache. Feel dizzy or you faint. Summary Palpitations are feelings that your heartbeat is irregular or is faster than normal. It may feel like your heart is fluttering or skipping a beat. Palpitations may be caused by many things, including smoking, caffeine, alcohol, stress, certain medicines, and drugs. Although most causes of palpitations are not serious, some causes can be a sign of a serious medical problem. Get help right away if you faint or have chest pain, shortness of breath, a severe headache, or dizziness. This information is not intended to replace advice given to you by your health care provider. Make sure you discuss any questions you have with your healthcare provider. Document Revised:  04/30/2017 Document Reviewed: 04/30/2017 Elsevier Patient Education  2022 [employer].

## 2020-12-06 ENCOUNTER — Ambulatory Visit: Payer: BC Managed Care – PPO | Admitting: Cardiology

## 2020-12-06 ENCOUNTER — Encounter: Payer: Self-pay | Admitting: Cardiology

## 2020-12-06 ENCOUNTER — Other Ambulatory Visit: Payer: Self-pay

## 2020-12-06 VITALS — BP 160/76 | HR 80 | Temp 96.4°F | Resp 17 | Ht 72.0 in | Wt 231.4 lb

## 2020-12-06 DIAGNOSIS — E7800 Pure hypercholesterolemia, unspecified: Secondary | ICD-10-CM

## 2020-12-06 DIAGNOSIS — I10 Essential (primary) hypertension: Secondary | ICD-10-CM

## 2020-12-06 DIAGNOSIS — R002 Palpitations: Secondary | ICD-10-CM

## 2020-12-06 DIAGNOSIS — R0609 Other forms of dyspnea: Secondary | ICD-10-CM

## 2020-12-06 DIAGNOSIS — R0600 Dyspnea, unspecified: Secondary | ICD-10-CM

## 2020-12-06 DIAGNOSIS — I739 Peripheral vascular disease, unspecified: Secondary | ICD-10-CM | POA: Diagnosis not present

## 2020-12-06 DIAGNOSIS — R0989 Other specified symptoms and signs involving the circulatory and respiratory systems: Secondary | ICD-10-CM | POA: Diagnosis not present

## 2020-12-06 MED ORDER — ASPIRIN 81 MG PO CHEW: 81.0000 mg | CHEWABLE_TABLET | Freq: Every day | ORAL | Status: AC

## 2020-12-06 MED ORDER — OLMESARTAN MEDOXOMIL-HCTZ 20-12.5 MG PO TABS: 1.0000 | ORAL_TABLET | ORAL | 3 refills | Status: DC

## 2020-12-06 NOTE — Progress Notes (Signed)
Primary Physician/Referring:  [name], MD  Patient ID: Robert Wise, male    DOB: 1960-10-22, 60 y.o.   MRN: [id number]  Chief Complaint  Patient presents with   New Patient (Initial Visit)   Palpitations   HPI:    Robert Wise  is a 60 y.o. 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  Over the past 2 months, patient has noticed frequent episodes of flip-flopping in his heart and brief episodes of rapid palpitations lasting a few seconds.  He has felt mild amount of discomfort and has noticed these episodes during routine exertional activity.  He does heavy chores as a Administrator and is also noticed that he gets short of breath with activity.  No PND or orthopnea or leg edema.  He smokes about 1.5 packs of cigarettes a day.  No excessive alcohol use.  No use of illicit drugs.  Past Medical History:  Diagnosis Date   Anxiety    Depression    Diabetes mellitus without complication ([place])    Hyperlipidemia    OSA (obstructive sleep apnea) 12/15/2012   Sleep apnea    Past Surgical History:  Procedure Laterality Date   ANKLE SURGERY Left 2006   VASECTOMY     Family History  Problem Relation Age of Onset   Cancer Mother    Heart failure Sister    Sleep apnea Neg Hx     Social History   Tobacco Use   Smoking status: Every Day    Packs/day: 1.25    Years: 37.00    Pack years: 46.25    Types: Cigarettes    Start date: 04/02/2019   Smokeless tobacco: Never  Substance Use Topics   Alcohol use: No    Comment: quit: 08/14/2011   Marital Status: Married  ROS  Review of Systems  Cardiovascular:  Positive for dyspnea on exertion and palpitations. Negative for chest  pain, leg swelling, near-syncope and orthopnea.  Respiratory:  Positive for snoring.   Gastrointestinal:  Negative for melena.  Objective  Blood pressure (!) 160/76, pulse 80, temperature (!) 96.4 F (35.8 C), temperature source Temporal, resp. rate 17, height 6' (1.829 m), weight 231 lb 6.4 oz (105 kg), SpO2 97 %. Body mass index is 31.38 kg/m.  Vitals with BMI 12/06/2020 12/06/2020 12/06/2020  Height - - _0   Weight - - 231 lbs 6 oz  BMI - - 82.70  Systolic 786 754 492  Diastolic 76 76 78  Pulse - 80 83    Physical Exam Constitutional:      Appearance: He is obese.  Neck:     Vascular: Carotid bruit (bilateral carotid bruit and prominant left subclavian artery bruit) present. No JVD.  Cardiovascular:     Rate and Rhythm: Normal rate and regular rhythm.     Pulses: Normal pulses and intact distal pulses.          Radial pulses are 2+ on the right side and 2+ on the left side.     Heart sounds: Murmur heard.  Early systolic murmur is present with a grade of 2/6 at the upper right sternal border.    No gallop.  Pulmonary:     Effort: Pulmonary effort is normal.  Breath sounds: Normal breath sounds.  Abdominal:     General: Bowel sounds are normal.     Palpations: Abdomen is soft.  Musculoskeletal:        General: No swelling.     Laboratory examination:   Recent Labs    11/27/20 1427  NA 136  K 4.3  CL 101  CO2 25  GLUCOSE 175*  BUN 11  CREATININE 0.87  CALCIUM 8.8   estimated creatinine clearance is 113.2 mL/min (by C-G formula based on SCr of 0.87 mg/dL).  CMP Latest Ref Rng & Units 11/27/2020 11/08/2019 04/26/2019  Glucose 70 - 99 mg/dL 175(H) - 123(H)  BUN 6 - 23 mg/dL 11 - 17  Creatinine 0.40 - 1.50 mg/dL 0.87 - 0.81  Sodium 135 - 145 mEq/L 136 - 142  Potassium 3.5 - 5.1 mEq/L 4.3 - 4.6  Chloride 96 - 112 mEq/L 101 - 103  CO2 19 - 32 mEq/L 25 - 21  Calcium 8.4 - 10.5 mg/dL 8.8 - 9.7  Total Protein 6.0 - 8.3 g/dL 6.7 7.4 7.5  Total Bilirubin 0.2 - 1.2 mg/dL  0.4 <0.2 0.3  Alkaline Phos 39 - 117 U/L 87 93 75  AST 0 - 37 U/L _0 ALT 0 - 53 U/L _1 CBC Latest Ref Rng & Units 06/02/2017 07/28/2015 02/09/2014  WBC 3.4 - 10.8 x10E3/uL 9.8 8.6 8.7  Hemoglobin 13.0 - 17.7 g/dL 14.7 15.5 14.5  Hematocrit 37.5 - 51.0 % 42.6 43.0(A) 43.7  Platelets 150 - 379 x10E3/uL 253 - -    Lipid Panel Recent Labs    12/20/19 1247 11/27/20 1427  CHOL 139 144  TRIG 217* 390.0*  LDLCALC 77  --   VLDL  --  78.0*  HDL 26* 24.70*  CHOLHDL 5.3* 6  LDLDIRECT  --  78.0   Lipid Panel     Component Value Date/Time   CHOL 144 11/27/2020 1427   CHOL 139 12/20/2019 1247   TRIG 390.0 (H) 11/27/2020 1427   HDL 24.70 (L) 11/27/2020 1427   HDL 26 (L) 12/20/2019 1247   CHOLHDL 6 11/27/2020 1427   VLDL 78.0 (H) 11/27/2020 1427   LDLCALC 77 12/20/2019 1247   LDLDIRECT 78.0 11/27/2020 1427   LABVLDL 36 12/20/2019 1247     HEMOGLOBIN A1C Lab Results  Component Value Date   HGBA1C 9.4 (H) 11/27/2020   MPG 177 12/29/2015   TSH Recent Labs    12/20/19 1247 11/27/20 1427  TSH 1.330 0.83   Medications and allergies   Allergies  Allergen Reactions   Tricor [Fenofibrate]    Zocor [Simvastatin]     Medication prior to this encounter:   Outpatient Medications Prior to Visit  Medication Sig Dispense Refill   atorvastatin (LIPITOR) 80 MG tablet Take 1 tablet (80 mg total) by mouth daily. 90 tablet 2   B Complex Vitamins (B-COMPLEX/B-12 PO) Take by mouth.     blood glucose meter kit and supplies KIT Dispense based on patient and insurance preference. Use up to four times daily as directed. (FOR ICD-9 250.00, 250.01). 1 each 0   Blood Glucose Monitoring Suppl (ONE TOUCH ULTRA SYSTEM KIT) W/DEVICE KIT 1 kit by Does not apply route once. 1 each 0   busPIRone (BUSPAR) 30 MG tablet Take 1 tablet (30 mg total) by mouth 2 (two) times daily. 180 tablet 1   cholecalciferol (VITAMIN D3) 25 MCG (1000 UT) tablet Take 2,000 Units by mouth daily.  Dapagliflozin-metFORMIN HCl ER (XIGDUO XR) 07-998 MG TB24 Take 2 tablets by mouth daily. 180 tablet 1   esomeprazole (NEXIUM) 40 MG capsule Take 1 capsule (40 mg total) by mouth daily. 90 capsule 3   ferrous sulfate 325 (65 FE) MG tablet Take 65 mg by mouth daily with breakfast.     fish oil-omega-3 fatty acids 1000 MG capsule Take 2 g by mouth daily.     Ginkgo Biloba Extract 60 MG CAPS Take by mouth.     glipiZIDE (GLUCOTROL) 10 MG tablet Take 1 tablet (10 mg total) by mouth 2 (two) times daily before a meal. 180 tablet 1   glucose blood (ONE TOUCH ULTRA TEST) test strip 1 each by Other route daily. 100 each 8   lamoTRIgine (LAMICTAL) 200 MG tablet Take 1 tablet (200 mg total) by mouth 2 (two) times daily. 180 tablet 1   Lancets (ONETOUCH ULTRASOFT) lancets USE AS INSTRUCTED 100 each 0   levothyroxine (SYNTHROID) 175 MCG tablet Take 1 tablet (175 mcg total) by mouth daily before breakfast. 90 tablet 3   PARoxetine (PAXIL) 30 MG tablet Take 2 tablets (60 mg total) by mouth daily. 180 tablet 1   lisinopril (ZESTRIL) 5 MG tablet Take 1 tablet (5 mg total) by mouth daily. 90 tablet 1   No facility-administered medications prior to visit.     Medication list after today's encounter   Current Outpatient Medications  Medication Instructions   aspirin (ASPIRIN CHILDRENS) 81 mg, Oral, Daily   atorvastatin (LIPITOR) 80 mg, Oral, Daily   B Complex Vitamins (B-COMPLEX/B-12 PO) Oral   blood glucose meter kit and supplies KIT Dispense based on patient and insurance preference. Use up to four times daily as directed. (FOR ICD-9 250.00, 250.01).   Blood Glucose Monitoring Suppl (ONE TOUCH ULTRA SYSTEM KIT) W/DEVICE KIT 1 kit, Does not apply,  Once   busPIRone (BUSPAR) 30 mg, Oral, 2 times daily   cholecalciferol (VITAMIN D3) 2,000 Units, Oral, Daily   Dapagliflozin-metFORMIN HCl ER (XIGDUO XR) 07-998 MG TB24 2 tablets, Oral, Daily   esomeprazole (NEXIUM) 40 mg, Oral, Daily   ferrous sulfate 65 mg,  Oral, Daily with breakfast   fish oil-omega-3 fatty acids 2 g, Daily   Ginkgo Biloba Extract 60 MG CAPS Oral   glipiZIDE (GLUCOTROL) 10 mg, Oral, 2 times daily before meals   glucose blood (ONE TOUCH ULTRA TEST) test strip 1 each, Other, Daily   lamoTRIgine (LAMICTAL) 200 mg, Oral, 2 times daily   Lancets (ONETOUCH ULTRASOFT) lancets USE AS INSTRUCTED   levothyroxine (SYNTHROID) 175 mcg, Oral, Daily before breakfast   olmesartan-hydrochlorothiazide (BENICAR HCT) 20-12.5 MG tablet 1 tablet, Oral, BH-each morning   PARoxetine (PAXIL) 60 mg, Oral, Daily    Radiology:   No results found.  Cardiac Studies:   NOne  EKG:   EKG 12/06/2020: Normal sinus rhythm at the rate of 71 bpm, normal axis, incomplete right bundle branch block.  No evidence of ischemia otherwise normal EKG.    Assessment     ICD-10-CM   1. Palpitations  R00.2 EKG 12-Lead    LONG TERM MONITOR (3-14 DAYS)    2. Dyspnea on exertion  R06.00 [facility] ECHOCARDIOGRAM COMPLETE    [facility] MYOCARDIAL PERFUSION WO LEXISCAN    3. Primary hypertension  I10 olmesartan-hydrochlorothiazide (BENICAR HCT) 20-12.5 MG tablet    Basic metabolic panel    Basic metabolic panel    4. PAD (peripheral artery disease) ([place]): Asymptomatic left subclavian stenosis/bruit  I73.9 aspirin (ASPIRIN CHILDRENS)  81 MG chewable tablet    5. Bilateral carotid bruits  R09.89 PCV CAROTID DUPLEX (BILATERAL)    6. Hypercholesteremia  E78.00        Medications Discontinued During This Encounter  Medication Reason   lisinopril (ZESTRIL) 5 MG tablet Change in therapy    Meds ordered this encounter  Medications   olmesartan-hydrochlorothiazide (BENICAR HCT) 20-12.5 MG tablet    Sig: Take 1 tablet by mouth every morning.    Dispense:  30 tablet    Refill:  3    Discontinue lisinopril   aspirin (ASPIRIN CHILDRENS) 81 MG chewable tablet    Sig: Chew 1 tablet (81 mg total) by mouth daily.    Orders Placed This Encounter  Procedures   Basic metabolic  panel    Standing Status:   Future    Number of Occurrences:   1    Standing Expiration Date:   12/06/2021   LONG TERM MONITOR (3-14 DAYS)    Standing Status:   Future    Number of Occurrences:   1    Standing Expiration Date:   12/06/2021    Order Specific Question:   Where should this test be performed?    Answer:   [facility]    Order Specific Question:   Does the patient have an implanted cardiac device?    Answer:   No    Order Specific Question:   Prescribed days of wear    Answer:   7    Order Specific Question:   Release to patient    Answer:   Immediate   PCV MYOCARDIAL PERFUSION WO LEXISCAN    Standing Status:   Future    Standing Expiration Date:   02/05/2021   EKG 12-Lead   [facility] ECHOCARDIOGRAM COMPLETE    Standing Status:   Future    Standing Expiration Date:   12/06/2021   Recommendations:   Robert Wise is a 60 y.o. 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  Over the past 2 months, patient has noticed frequent episodes of flip-flopping in his heart and brief episodes of rapid palpitations lasting a few seconds.  He has felt mild amount of discomfort and has noticed these episodes during routine exertional activity and also associated with short of breath with activity.  No PND or orthopnea or leg edema.  He smokes about 1.5 packs of cigarettes a day.  Truck Geophysicist/field seismologist by profession.  His physical examination is markedly abnormal, he has all telltale signs of peripheral arterial disease with left subclavian artery asymptomatic stenosis with a 20 mmHg blood pressure differential, carotid bruit bilaterally, uncontrolled diabetes mellitus.  He also has marked mixed  hyperlipidemia related to poor eating habits and continued smoking as well contributing to his overall poor health.  I had extensive discussion with the patient regarding only several risk factors, Patient instructed to start ASA  66m q daily for prophylaxis.  Discontinue lisinopril and switch him to olmesartan HCT 20/12.5 mg daily in view of elevated blood pressure which he has noticed at home as well and he probably has primary hypertension.  Check BMP in [redacted] weeks along with BNP for dyspnea and chronic diastolic heart failure. He appears to be very motivated with smoking cessation and also  making lifestyle changes with regard to his diet.  I will set up for a Event/Extended EKG monitor for 14 days. Explained how to use it and to activate the device. Will schedule for an echocardiogram. Schedule for a Exercise Nuclear stress test to evaluate for myocardial ischemia. Office visit following the work-up/investigations.    Adrian Prows, MD, [facility] 12/06/2020, 9:28 PM Office: [phone]

## 2020-12-19 ENCOUNTER — Ambulatory Visit: Payer: BC Managed Care – PPO

## 2020-12-19 ENCOUNTER — Other Ambulatory Visit: Payer: Self-pay

## 2020-12-19 DIAGNOSIS — R0600 Dyspnea, unspecified: Secondary | ICD-10-CM

## 2020-12-19 DIAGNOSIS — R0609 Other forms of dyspnea: Secondary | ICD-10-CM

## 2020-12-26 ENCOUNTER — Other Ambulatory Visit: Payer: BC Managed Care – PPO

## 2020-12-26 ENCOUNTER — Inpatient Hospital Stay: Payer: BC Managed Care – PPO

## 2020-12-26 DIAGNOSIS — R002 Palpitations: Secondary | ICD-10-CM

## 2020-12-27 ENCOUNTER — Other Ambulatory Visit: Payer: Self-pay

## 2020-12-27 ENCOUNTER — Ambulatory Visit: Payer: BC Managed Care – PPO

## 2020-12-27 ENCOUNTER — Inpatient Hospital Stay: Payer: BC Managed Care – PPO

## 2020-12-27 DIAGNOSIS — I6523 Occlusion and stenosis of bilateral carotid arteries: Secondary | ICD-10-CM | POA: Diagnosis not present

## 2020-12-27 DIAGNOSIS — R0609 Other forms of dyspnea: Secondary | ICD-10-CM | POA: Diagnosis not present

## 2020-12-27 DIAGNOSIS — R0600 Dyspnea, unspecified: Secondary | ICD-10-CM

## 2020-12-27 DIAGNOSIS — R0989 Other specified symptoms and signs involving the circulatory and respiratory systems: Secondary | ICD-10-CM

## 2020-12-27 DIAGNOSIS — R002 Palpitations: Secondary | ICD-10-CM | POA: Diagnosis not present

## 2021-01-01 NOTE — Progress Notes (Signed)
Carotid artery duplex 12/27/2020: Duplex suggests stenosis in the right common carotid artery bifurcation (>50%) with heterogeneous plaque.  Duplex suggests stenosis in the right common carotid artery (<50%) with homogeneous plaque. Duplex suggests stenosis in the right external carotid artery (<50%) with heterogeneous plaque. Duplex suggests stenosis in the left internal carotid artery (50-69%) with heterogeneous plaque. Duplex suggests stenosis in the left common carotid artery (<50%) with homogeneous plaque. Duplex suggests stenosis in the left external carotid artery (<50%) with heterogeneous plaque. Antegrade right vertebral artery flow. Antegrade left vertebral artery flow. Follow up in six months is appropriate if clinically indicated.

## 2021-01-10 DIAGNOSIS — R002 Palpitations: Secondary | ICD-10-CM | POA: Diagnosis not present

## 2021-01-16 DIAGNOSIS — I10 Essential (primary) hypertension: Secondary | ICD-10-CM | POA: Diagnosis not present

## 2021-01-16 DIAGNOSIS — R002 Palpitations: Secondary | ICD-10-CM | POA: Diagnosis not present

## 2021-01-17 DIAGNOSIS — Z23 Encounter for immunization: Secondary | ICD-10-CM | POA: Diagnosis not present

## 2021-01-17 LAB — BASIC METABOLIC PANEL
BUN/Creatinine Ratio: 17 (ref 10–24)
BUN: 18 mg/dL (ref 8–27)
CO2: 21 mmol/L (ref 20–29)
Calcium: 9.5 mg/dL (ref 8.6–10.2)
Chloride: 99 mmol/L (ref 96–106)
Creatinine, Ser: 1.08 mg/dL (ref 0.76–1.27)
Glucose: 131 mg/dL — ABNORMAL HIGH (ref 70–99)
Potassium: 4.6 mmol/L (ref 3.5–5.2)
Sodium: 138 mmol/L (ref 134–144)
eGFR: 79 mL/min/{1.73_m2} (ref >59)

## 2021-01-21 DIAGNOSIS — M545 Low back pain, unspecified: Secondary | ICD-10-CM | POA: Diagnosis not present

## 2021-01-21 DIAGNOSIS — M62830 Muscle spasm of back: Secondary | ICD-10-CM | POA: Diagnosis not present

## 2021-01-22 ENCOUNTER — Ambulatory Visit: Payer: BC Managed Care – PPO | Admitting: Cardiology

## 2021-01-22 ENCOUNTER — Other Ambulatory Visit: Payer: Self-pay

## 2021-01-22 ENCOUNTER — Encounter: Payer: Self-pay | Admitting: Cardiology

## 2021-01-22 VITALS — BP 139/72 | HR 70 | Temp 98.3°F | Resp 17 | Ht 72.0 in | Wt 231.4 lb

## 2021-01-22 DIAGNOSIS — F17200 Nicotine dependence, unspecified, uncomplicated: Secondary | ICD-10-CM | POA: Diagnosis not present

## 2021-01-22 DIAGNOSIS — R002 Palpitations: Secondary | ICD-10-CM

## 2021-01-22 DIAGNOSIS — I10 Essential (primary) hypertension: Secondary | ICD-10-CM

## 2021-01-22 DIAGNOSIS — E7800 Pure hypercholesterolemia, unspecified: Secondary | ICD-10-CM

## 2021-01-22 DIAGNOSIS — I6523 Occlusion and stenosis of bilateral carotid arteries: Secondary | ICD-10-CM

## 2021-01-22 DIAGNOSIS — R0609 Other forms of dyspnea: Secondary | ICD-10-CM

## 2021-01-22 MED ORDER — NICOTINE 14 MG/24HR TD PT24
14.0000 mg | MEDICATED_PATCH | Freq: Every day | TRANSDERMAL | 0 refills | Status: DC
Start: 2021-01-22 — End: 2021-04-23

## 2021-01-22 MED ORDER — NICOTINE 7 MG/24HR TD PT24: 7.0000 mg | MEDICATED_PATCH | Freq: Every day | TRANSDERMAL | 1 refills | Status: DC

## 2021-01-22 MED ORDER — OLMESARTAN MEDOXOMIL-HCTZ 20-12.5 MG PO TABS: 1.0000 | ORAL_TABLET | ORAL | 3 refills | Status: DC

## 2021-01-22 MED ORDER — NICOTINE 21 MG/24HR TD PT24: 21.0000 mg | MEDICATED_PATCH | Freq: Every day | TRANSDERMAL | 0 refills | Status: DC

## 2021-01-22 NOTE — Progress Notes (Signed)
Primary Physician/Referring:  [name], MD  Patient ID: Robert Wise, male    DOB: [birth year], 60 y.o.   MRN: [id number]  Chief Complaint  Patient presents with   Shortness of Breath   Palpitations    6 WEEKS    HPI:    Robert Wise  is a 60 y.o. 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  He now presents for follow-up of worsening dyspnea, palpitations and 1 episode of chest tightness.  Since last office visit 6 weeks ago he has not had any further episodes of chest pain.  Dyspnea is remained stable and is accompanied by his wife. He smokes about 1.5 packs of cigarettes a day.  No excessive alcohol use.  No use of illicit drugs.  Past Medical History:  Diagnosis Date   Anxiety    Depression    Diabetes mellitus without complication ([place])    Hyperlipidemia    OSA (obstructive sleep apnea) 12/15/2012   Sleep apnea    Past Surgical History:  Procedure Laterality Date   ANKLE SURGERY Left 2006   VASECTOMY     Family History  Problem Relation Age of Onset   Cancer Mother    Heart failure Sister    Sleep apnea Neg Hx     Social History   Tobacco Use   Smoking status: Every Day    Packs/day: 1.25    Years: 37.00    Pack years: 46.25    Types: Cigarettes    Start date: 04/02/2019   Smokeless tobacco: Never  Substance Use Topics   Alcohol use: No    Comment: quit: 08/14/2011   Marital Status: Married  ROS  Review of Systems  Cardiovascular:  Positive for dyspnea on exertion and palpitations. Negative for chest pain, leg swelling, near-syncope and orthopnea.  Respiratory:  Positive for snoring.   Gastrointestinal:  Negative for melena.  Objective  Blood  pressure 139/72, pulse 70, temperature 98.3 F (36.8 C), temperature source Temporal, resp. rate 17, height 6' (1.829 m), weight 231 lb 6.4 oz (105 kg), peak flow 96 L/min. Body mass index is 31.38 kg/m.  Vitals with BMI 01/22/2021 12/06/2020 12/06/2020  Height 6' 0"  - -  Weight 231 lbs 6 oz - -  BMI 68.03 - -  Systolic 212 248 250  Diastolic 72 76 76  Pulse 70 - 80    Physical Exam Constitutional:      Appearance: He is obese.  Neck:     Vascular: Carotid bruit (bilateral carotid bruit and prominant left subclavian artery bruit) present. No JVD.  Cardiovascular:     Rate and Rhythm: Normal rate and regular rhythm.     Pulses: Normal pulses and intact distal pulses.          Radial pulses are 2+ on the right side and 2+ on the left side.     Heart sounds: Murmur heard.  Early systolic murmur is present with a grade of 2/6 at the upper right sternal border.    No gallop.  Pulmonary:     Effort: Pulmonary effort is normal.     Breath sounds: Normal breath sounds.  Abdominal:     General: Bowel sounds are normal.     Palpations: Abdomen  is soft.  Musculoskeletal:        General: No swelling.     Laboratory examination:   Recent Labs    11/27/20 1427 01/16/21 1430  NA 136 138  K 4.3 4.6  CL 101 99  CO2 25 21  GLUCOSE 175* 131*  BUN 11 18  CREATININE 0.87 1.08  CALCIUM 8.8 9.5   estimated creatinine clearance is 91.2 mL/min (by C-G formula based on SCr of 1.08 mg/dL).  CMP Latest Ref Rng & Units 01/16/2021 11/27/2020 11/08/2019  Glucose 70 - 99 mg/dL 131(H) 175(H) -  BUN 8 - 27 mg/dL 18 11 -  Creatinine 0.76 - 1.27 mg/dL 1.08 0.87 -  Sodium 134 - 144 mmol/L 138 136 -  Potassium 3.5 - 5.2 mmol/L 4.6 4.3 -  Chloride 96 - 106 mmol/L 99 101 -  CO2 20 - 29 mmol/L 21 25 -  Calcium 8.6 - 10.2 mg/dL 9.5 8.8 -  Total Protein 6.0 - 8.3 g/dL - 6.7 7.4  Total Bilirubin 0.2 - 1.2 mg/dL - 0.4 <0.2  Alkaline Phos 39 - 117 U/L - 87 93  AST 0 - 37 U/L - 13 14  ALT 0 - 53 U/L - 14 15    CBC Latest Ref Rng & Units 06/02/2017 07/28/2015 02/09/2014  WBC 3.4 - 10.8 x10E3/uL 9.8 8.6 8.7  Hemoglobin 13.0 - 17.7 g/dL 14.7 15.5 14.5  Hematocrit 37.5 - 51.0 % 42.6 43.0(A) 43.7  Platelets 150 - 379 x10E3/uL 253 - -    Lipid Panel Recent Labs    11/27/20 1427  CHOL 144  TRIG 390.0*  VLDL 78.0*  HDL 24.70*  CHOLHDL 6  LDLDIRECT 78.0   Lipid Panel     Component Value Date/Time   CHOL 144 11/27/2020 1427   CHOL 139 12/20/2019 1247   TRIG 390.0 (H) 11/27/2020 1427   HDL 24.70 (L) 11/27/2020 1427   HDL 26 (L) 12/20/2019 1247   CHOLHDL 6 11/27/2020 1427   VLDL 78.0 (H) 11/27/2020 1427   LDLCALC 77 12/20/2019 1247   LDLDIRECT 78.0 11/27/2020 1427   LABVLDL 36 12/20/2019 1247     HEMOGLOBIN A1C Lab Results  Component Value Date   HGBA1C 9.4 (H) 11/27/2020   MPG 177 12/29/2015   TSH Recent Labs    11/27/20 1427  TSH 0.83   BNP (last 3 results) No results for input(s): BNP in the last 8760 hours.  ProBNP (last 3 results) No results for input(s): PROBNP in the last 8760 hours.  Medications and allergies   Allergies  Allergen Reactions   Tricor [Fenofibrate]    Zocor [Simvastatin]     Medication prior to this encounter:   Outpatient Medications Prior to Visit  Medication Sig Dispense Refill   aspirin (ASPIRIN CHILDRENS) 81 MG chewable tablet Chew 1 tablet (81 mg total) by mouth daily.     atorvastatin (LIPITOR) 80 MG tablet Take 1 tablet (80 mg total) by mouth daily. 90 tablet 2   B Complex Vitamins (B-COMPLEX/B-12 PO) Take by mouth.     blood glucose meter kit and supplies KIT Dispense based on patient and insurance preference. Use up to four times daily as directed. (FOR ICD-9 250.00, 250.01). 1 each 0   Blood Glucose Monitoring Suppl (ONE TOUCH ULTRA SYSTEM KIT) W/DEVICE KIT 1 kit by Does not apply route once. 1 each 0   busPIRone (BUSPAR) 30 MG tablet Take 1 tablet (30 mg total) by mouth 2 (two) times daily. 180 tablet 1  cholecalciferol (VITAMIN  D3) 25 MCG (1000 UT) tablet Take 2,000 Units by mouth daily.     cyclobenzaprine (FLEXERIL) 5 MG tablet Take 5 mg by mouth 3 (three) times daily as needed for muscle spasms.     Dapagliflozin-metFORMIN HCl ER (XIGDUO XR) 07-998 MG TB24 Take 2 tablets by mouth daily. 180 tablet 1   esomeprazole (NEXIUM) 40 MG capsule Take 1 capsule (40 mg total) by mouth daily. 90 capsule 3   ferrous sulfate 325 (65 FE) MG tablet Take 65 mg by mouth daily with breakfast.     fish oil-omega-3 fatty acids 1000 MG capsule Take 2 g by mouth daily.     Ginkgo Biloba Extract 60 MG CAPS Take by mouth.     glipiZIDE (GLUCOTROL) 10 MG tablet Take 1 tablet (10 mg total) by mouth 2 (two) times daily before a meal. 180 tablet 1   glucose blood (ONE TOUCH ULTRA TEST) test strip 1 each by Other route daily. 100 each 8   lamoTRIgine (LAMICTAL) 200 MG tablet Take 1 tablet (200 mg total) by mouth 2 (two) times daily. 180 tablet 1   Lancets (ONETOUCH ULTRASOFT) lancets USE AS INSTRUCTED 100 each 0   levothyroxine (SYNTHROID) 175 MCG tablet Take 1 tablet (175 mcg total) by mouth daily before breakfast. 90 tablet 3   olmesartan-hydrochlorothiazide (BENICAR HCT) 20-12.5 MG tablet Take 1 tablet by mouth every morning. 30 tablet 3   PARoxetine (PAXIL) 30 MG tablet Take 2 tablets (60 mg total) by mouth daily. 180 tablet 1   predniSONE (STERAPRED UNI-PAK 21 TAB) 10 MG (21) TBPK tablet Take by mouth.     No facility-administered medications prior to visit.     Medication list after today's encounter   Current Outpatient Medications  Medication Instructions   aspirin (ASPIRIN CHILDRENS) 81 mg, Oral, Daily   atorvastatin (LIPITOR) 80 mg, Oral, Daily   B Complex Vitamins (B-COMPLEX/B-12 PO) Oral   blood glucose meter kit and supplies KIT Dispense based on patient and insurance preference. Use up to four times daily as directed. (FOR ICD-9 250.00, 250.01).   Blood Glucose Monitoring Suppl (ONE TOUCH ULTRA SYSTEM KIT) W/DEVICE KIT 1 kit,  Does not apply,  Once   busPIRone (BUSPAR) 30 mg, Oral, 2 times daily   cholecalciferol (VITAMIN D3) 2,000 Units, Oral, Daily   cyclobenzaprine (FLEXERIL) 5 mg, Oral, 3 times daily PRN   Dapagliflozin-metFORMIN HCl ER (XIGDUO XR) 07-998 MG TB24 2 tablets, Oral, Daily   esomeprazole (NEXIUM) 40 mg, Oral, Daily   ferrous sulfate 65 mg, Oral, Daily with breakfast   fish oil-omega-3 fatty acids 2 g, Daily   Ginkgo Biloba Extract 60 MG CAPS Oral   glipiZIDE (GLUCOTROL) 10 mg, Oral, 2 times daily before meals   glucose blood (ONE TOUCH ULTRA TEST) test strip 1 each, Other, Daily   lamoTRIgine (LAMICTAL) 200 mg, Oral, 2 times daily   Lancets (ONETOUCH ULTRASOFT) lancets USE AS INSTRUCTED   levothyroxine (SYNTHROID) 175 mcg, Oral, Daily before breakfast   olmesartan-hydrochlorothiazide (BENICAR HCT) 20-12.5 MG tablet 1 tablet, Oral, BH-each morning   PARoxetine (PAXIL) 60 mg, Oral, Daily   predniSONE (STERAPRED UNI-PAK 21 TAB) 10 MG (21) TBPK tablet Oral    Radiology:   No results found.  Cardiac Studies:   PCV MYOCARDIAL PERFUSION WO LEXISCAN 12/19/2020  Narrative Lexiscan Tetrofosmin stress test 12/19/2020: 1 Day Rest/Stress Protocol. Stress EKG is non-diagnostic, as this is pharmacological stress test. Small size, mild intensity, fixed perfusion defect most likely secondary to  soft tissue attenuation (images obtained while sitting upright and BMI >30).  Without convincing evidence of reversible myocardial ischemia or prior infarct. LVEF 56%, LV size mildly dilated, wall thickness is preserved without regional wall motion abnormalities. No prior studies available for comparison. Low risk study.   PCV ECHOCARDIOGRAM COMPLETE 12/27/2020  Narrative Echocardiogram 12/27/2020: Left ventricle cavity is normal in size. Moderate concentric hypertrophy of the left ventricle. Normal global wall motion. Normal LV systolic function with EF 68%. Doppler evidence of grade I (impaired) diastolic  dysfunction, normal LAP. Left atrial cavity is borderline dilated. Trileaflet aortic valve.  Mild (Grade I) aortic regurgitation. Mild (Grade I) mitral regurgitation. Mild tricuspid regurgitation. No evidence of pulmonary hypertension.  Zio Patch Extended out patient EKG monitoring 7 days starting 12/27/2020: Predominant rhythm is normal sinus rhythm.  There were 3 brief atrial tachycardia episodes.  There were occasional PACs, rare PVCs. There were no diary entries however triggered events correlated with PACs and PVCs.  There was no atrial fibrillation, there was no heart block, no complex ventricular arrhythmias.    Carotid artery duplex 12/27/2020: Duplex suggests stenosis in the right common carotid artery bifurcation (>50%) with heterogeneous plaque.  Duplex suggests stenosis in the right common carotid artery (<50%) with homogeneous plaque. Duplex suggests stenosis in the right external carotid artery (<50%) with heterogeneous plaque. Duplex suggests stenosis in the left internal carotid artery (50-69%) with heterogeneous plaque. Duplex suggests stenosis in the left common carotid artery (<50%) with homogeneous plaque. Duplex suggests stenosis in the left external carotid artery (<50%) with heterogeneous plaque. Antegrade right vertebral artery flow. Antegrade left vertebral artery flow. Follow up in six months is appropriate if clinically indicated  EKG:   EKG 12/06/2020: Normal sinus rhythm at the rate of 71 bpm, normal axis, incomplete right bundle branch block.  No evidence of ischemia otherwise normal EKG.    Assessment     ICD-10-CM   1. Dyspnea on exertion  R06.09     2. Hypercholesteremia  E78.00     3. Asymptomatic bilateral carotid artery stenosis  I65.23 PCV CAROTID DUPLEX (BILATERAL)    4. Palpitations  R00.2        There are no discontinued medications.   No orders of the defined types were placed in this encounter.   No orders of the defined types were placed  in this encounter.  Recommendations:   Robert Wise is a 60 y.o. 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  Since last office visit 6 weeks ago he has not had any further episodes of chest pain.  Dyspnea is remained stable and is accompanied by his wife.  I reviewed the results of the stress test and also echocardiogram fortunately they are low risk.  I again discussed with him regarding need for control of diabetes mellitus.  He may benefit from diabetes education, patient's wife was present states that she is not aware of what foods she should choose from.  I have given them a [facility] low glycemic diet sheet but will request Dr. Carlota Raspberry to follow-up on that nutritional referral if indicated.  With regard to hyperlipidemia, elevated triglycerides are related to uncontrolled diabetes which should improve.  If with diet changes that do not improve, we could consider Vascepa.  Blood pressure is now well controlled on olmesartan HCT and I refilled the prescription.  Smoking cessation discussed and nicotine patches prescribed as well.  I reviewed carotid artery duplex, patient has asymptomatic bilateral carotid stenosis.  He will need continued surveillance of carotid disease.  With regard to palpitations, symptoms fortunately do not suggest atrial fibrillation and event monitor reveals PACs and PVCs.    Adrian Prows, MD, [facility] 01/22/2021, 11:32 AM Office: [phone]

## 2021-01-23 DIAGNOSIS — H40033 Anatomical narrow angle, bilateral: Secondary | ICD-10-CM | POA: Diagnosis not present

## 2021-01-23 DIAGNOSIS — E119 Type 2 diabetes mellitus without complications: Secondary | ICD-10-CM | POA: Diagnosis not present

## 2021-01-27 DIAGNOSIS — M62830 Muscle spasm of back: Secondary | ICD-10-CM | POA: Diagnosis not present

## 2021-01-30 DIAGNOSIS — H25813 Combined forms of age-related cataract, bilateral: Secondary | ICD-10-CM | POA: Diagnosis not present

## 2021-01-30 DIAGNOSIS — Z01818 Encounter for other preprocedural examination: Secondary | ICD-10-CM | POA: Diagnosis not present

## 2021-02-16 ENCOUNTER — Other Ambulatory Visit: Payer: Self-pay | Admitting: Cardiology

## 2021-02-16 DIAGNOSIS — F17200 Nicotine dependence, unspecified, uncomplicated: Secondary | ICD-10-CM

## 2021-04-01 HISTORY — PX: CATARACT EXTRACTION: SUR2

## 2021-04-13 DIAGNOSIS — H2511 Age-related nuclear cataract, right eye: Secondary | ICD-10-CM | POA: Diagnosis not present

## 2021-04-13 DIAGNOSIS — H25811 Combined forms of age-related cataract, right eye: Secondary | ICD-10-CM | POA: Diagnosis not present

## 2021-04-13 DIAGNOSIS — Z01818 Encounter for other preprocedural examination: Secondary | ICD-10-CM | POA: Diagnosis not present

## 2021-04-13 LAB — HM DIABETES EYE EXAM

## 2021-04-23 ENCOUNTER — Encounter: Payer: Self-pay | Admitting: Cardiology

## 2021-04-23 ENCOUNTER — Other Ambulatory Visit: Payer: Self-pay

## 2021-04-23 ENCOUNTER — Ambulatory Visit: Payer: BC Managed Care – PPO | Admitting: Cardiology

## 2021-04-23 VITALS — BP 145/78 | HR 73 | Temp 97.9°F | Ht 72.0 in | Wt 232.0 lb

## 2021-04-23 DIAGNOSIS — I6523 Occlusion and stenosis of bilateral carotid arteries: Secondary | ICD-10-CM | POA: Diagnosis not present

## 2021-04-23 DIAGNOSIS — F17200 Nicotine dependence, unspecified, uncomplicated: Secondary | ICD-10-CM | POA: Diagnosis not present

## 2021-04-23 DIAGNOSIS — I10 Essential (primary) hypertension: Secondary | ICD-10-CM

## 2021-04-23 DIAGNOSIS — R0609 Other forms of dyspnea: Secondary | ICD-10-CM

## 2021-04-23 DIAGNOSIS — E1165 Type 2 diabetes mellitus with hyperglycemia: Secondary | ICD-10-CM

## 2021-04-23 MED ORDER — OLMESARTAN MEDOXOMIL-HCTZ 40-12.5 MG PO TABS: 1.0000 | ORAL_TABLET | ORAL | 3 refills | Status: DC

## 2021-04-23 NOTE — Progress Notes (Signed)
Primary Physician/Referring:  [name], MD  Patient ID: Robert Wise, male    DOB: 08/01/1960, 61 y.o.   MRN: [id number]  Chief Complaint  Patient presents with   Hypertension   Follow-up    HPI:    Robert Wise  is a 61 y.o. 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  He has 50-60+ year history of smoking cigarettes.  Since last office visit 3 months ago he has not had any further episodes of chest pain.  Dyspnea is remained stable   Past Medical History:  Diagnosis Date   Anxiety    Depression    Diabetes mellitus without complication (HCC)    Hyperlipidemia    OSA (obstructive sleep apnea) 12/15/2012   Sleep apnea    Past Surgical History:  Procedure Laterality Date   ANKLE SURGERY Left 04/01/2004   CATARACT EXTRACTION Right 04/2021   VASECTOMY     Family History  Problem Relation Age of Onset   Cancer Mother    Heart failure Sister    Sleep apnea Neg Hx     Social History   Tobacco Use   Smoking status: Every Day    Packs/day: 1.25    Years: 37.00    Pack years: 46.25    Types: Cigarettes    Start date: 04/02/2019   Smokeless tobacco: Never  Substance Use Topics   Alcohol use: No    Comment: quit: 08/14/2011   Marital Status: Married  ROS  Review of Systems  Cardiovascular:  Positive for dyspnea on exertion. Negative for chest pain, leg swelling, orthopnea and palpitations.  Respiratory:  Positive for snoring.   Gastrointestinal:  Negative for melena.  Objective  Blood pressure (!) 145/78, pulse 73, temperature 97.9 F (36.6 C), temperature source Temporal, height 6' (1.829 m), weight 232 lb (105.2 kg), SpO2 97 %. Body mass index is 31.46 kg/m.  Vitals with BMI 04/23/2021  01/22/2021 12/06/2020  Height 6' 0"  6' 0"  -  Weight 232 lbs 231 lbs 6 oz -  BMI 97.41 63.84 -  Systolic 536 468 032  Diastolic 78 72 76  Pulse 73 70 -    Physical Exam Constitutional:      Appearance: He is obese.  Neck:     Vascular: Carotid bruit (bilateral carotid bruit and prominant left subclavian artery bruit) present. No JVD.  Cardiovascular:     Rate and Rhythm: Normal rate and regular rhythm.     Pulses: Normal pulses and intact distal pulses.          Radial pulses are 2+ on the right side and 2+ on the left side.     Heart sounds: Murmur heard.  Early systolic murmur is present with a grade of 2/6 at the upper right sternal border.    No gallop.  Pulmonary:     Effort: Pulmonary effort is normal.     Breath sounds: Normal breath sounds.  Abdominal:     General: Bowel sounds are normal.     Palpations: Abdomen is soft.  Musculoskeletal:        General: No swelling.     Laboratory examination:   Recent Labs    11/27/20 1427 01/16/21 1430  NA 136 138  K 4.3 4.6  CL 101 99  CO2 25 21  GLUCOSE 175* 131*  BUN 11 18  CREATININE 0.87 1.08  CALCIUM 8.8 9.5   CrCl cannot be calculated (Patient's most recent lab result is older than the maximum 21 days allowed.).  CMP Latest Ref Rng & Units 01/16/2021 11/27/2020 11/08/2019  Glucose 70 - 99 mg/dL 131(H) 175(H) -  BUN 8 - 27 mg/dL 18 11 -  Creatinine 0.76 - 1.27 mg/dL 1.08 0.87 -  Sodium 134 - 144 mmol/L 138 136 -  Potassium 3.5 - 5.2 mmol/L 4.6 4.3 -  Chloride 96 - 106 mmol/L 99 101 -  CO2 20 - 29 mmol/L 21 25 -  Calcium 8.6 - 10.2 mg/dL 9.5 8.8 -  Total Protein 6.0 - 8.3 g/dL - 6.7 7.4  Total Bilirubin 0.2 - 1.2 mg/dL - 0.4 <0.2  Alkaline Phos 39 - 117 U/L - 87 93  AST 0 - 37 U/L - 13 14  ALT 0 - 53 U/L - 14 15   CBC Latest Ref Rng & Units 06/02/2017 07/28/2015 02/09/2014  WBC 3.4 - 10.8 x10E3/uL 9.8 8.6 8.7  Hemoglobin 13.0 - 17.7 g/dL 14.7 15.5 14.5  Hematocrit 37.5 - 51.0 % 42.6 43.0(A) 43.7  Platelets 150  - 379 x10E3/uL 253 - -    Lipid Panel Recent Labs    11/27/20 1427  CHOL 144  TRIG 390.0*  VLDL 78.0*  HDL 24.70*  CHOLHDL 6  LDLDIRECT 78.0   Lipid Panel     Component Value Date/Time   CHOL 144 11/27/2020 1427   CHOL 139 12/20/2019 1247   TRIG 390.0 (H) 11/27/2020 1427   HDL 24.70 (L) 11/27/2020 1427   HDL 26 (L) 12/20/2019 1247   CHOLHDL 6 11/27/2020 1427   VLDL 78.0 (H) 11/27/2020 1427   LDLCALC 77 12/20/2019 1247   LDLDIRECT 78.0 11/27/2020 1427   LABVLDL 36 12/20/2019 1247     HEMOGLOBIN A1C Lab Results  Component Value Date   HGBA1C 9.4 (H) 11/27/2020   MPG 177 12/29/2015   TSH Recent Labs    11/27/20 1427  TSH 0.83   BNP (last 3 results) No results for input(s): BNP in the last 8760 hours.  ProBNP (last 3 results) No results for input(s): PROBNP in the last 8760 hours.  Medications and allergies   Allergies  Allergen Reactions   Tricor [Fenofibrate]    Zocor [Simvastatin]     Medication prior to this encounter:   Outpatient Medications Prior to Visit  Medication Sig Dispense Refill   aspirin (ASPIRIN CHILDRENS) 81 MG chewable tablet Chew 1 tablet (81 mg total) by mouth daily.     atorvastatin (LIPITOR) 80 MG tablet Take 1 tablet (80 mg total) by mouth daily. 90 tablet 2   B Complex Vitamins (B-COMPLEX/B-12 PO) Take by mouth.     blood glucose meter kit and supplies KIT Dispense based on patient and insurance preference. Use up to four times daily as directed. (FOR ICD-9 250.00, 250.01). 1 each 0   Blood Glucose Monitoring Suppl (ONE TOUCH ULTRA SYSTEM KIT) W/DEVICE KIT 1 kit by Does not apply route once. 1 each 0   busPIRone (BUSPAR) 30 MG tablet Take 1 tablet (30 mg total) by mouth 2 (two) times daily. 180 tablet 1   cholecalciferol (VITAMIN D3) 25 MCG (1000 UT) tablet Take 2,000 Units by mouth daily.     Dapagliflozin-metFORMIN HCl ER (XIGDUO XR) 07-998 MG TB24 Take 2 tablets by mouth daily. 180 tablet 1   esomeprazole (NEXIUM) 40 MG capsule  Take 1 capsule (40 mg total) by mouth daily. 90 capsule 3   ferrous sulfate 325 (65 FE) MG tablet Take 65 mg by mouth daily with breakfast.     fish oil-omega-3 fatty acids 1000 MG capsule Take 2 g by mouth daily.     Ginkgo Biloba Extract 60 MG CAPS Take by mouth.     glipiZIDE (GLUCOTROL) 10 MG tablet Take 1 tablet (10 mg total) by mouth 2 (two) times daily before a meal. 180 tablet 1   glucose blood (ONE TOUCH ULTRA TEST) test strip 1 each by Other route daily. 100 each 8   lamoTRIgine (LAMICTAL) 200 MG tablet Take 1 tablet (200 mg total) by mouth 2 (two) times daily. 180 tablet 1   Lancets (ONETOUCH ULTRASOFT) lancets USE AS INSTRUCTED 100 each 0   levothyroxine (SYNTHROID) 175 MCG tablet Take 1 tablet (175 mcg total) by mouth daily before breakfast. 90 tablet 3   PARoxetine (PAXIL) 30 MG tablet Take 2 tablets (60 mg total) by mouth daily. 180 tablet 1   olmesartan-hydrochlorothiazide (BENICAR HCT) 20-12.5 MG tablet Take 1 tablet by mouth every morning. 90 tablet 3   cyclobenzaprine (FLEXERIL) 5 MG tablet Take 5 mg by mouth 3 (three) times daily as needed for muscle spasms. (Patient not taking: Reported on 04/23/2021)     nicotine (NICODERM CQ - DOSED IN MG/24 HOURS) 21 mg/24hr patch PLACE 1 PATCH ONTO THE SKIN DAILY. 28 patch 2   nicotine (NICODERM CQ) 14 mg/24hr patch Place 1 patch (14 mg total) onto the skin daily. 28 patch 0   nicotine (NICODERM CQ) 7 mg/24hr patch Place 1 patch (7 mg total) onto the skin daily. 28 patch 1   predniSONE (STERAPRED UNI-PAK 21 TAB) 10 MG (21) TBPK tablet Take by mouth.     No facility-administered medications prior to visit.     Medication list after today's encounter   Current Outpatient Medications  Medication Instructions   aspirin (ASPIRIN CHILDRENS) 81 mg, Oral, Daily   atorvastatin (LIPITOR) 80 mg, Oral, Daily   B Complex Vitamins (B-COMPLEX/B-12 PO) Oral   blood glucose meter kit and supplies KIT Dispense based on patient and insurance  preference. Use up to four times daily as directed. (FOR ICD-9 250.00, 250.01).   Blood Glucose Monitoring Suppl (ONE TOUCH ULTRA SYSTEM KIT) W/DEVICE KIT 1 kit, Does not apply,  Once   busPIRone (BUSPAR) 30 mg, Oral, 2 times daily   cholecalciferol (VITAMIN D3) 2,000 Units, Oral, Daily   cyclobenzaprine (FLEXERIL) 5 mg, 3 times daily PRN   Dapagliflozin-metFORMIN HCl ER (XIGDUO XR) 07-998 MG TB24 2 tablets, Oral, Daily   esomeprazole (NEXIUM) 40 mg, Oral, Daily   ferrous sulfate 65 mg, Oral, Daily with breakfast   fish oil-omega-3 fatty acids 2 g, Daily   Ginkgo Biloba Extract 60 MG CAPS Oral   glipiZIDE (GLUCOTROL) 10 mg, Oral, 2 times daily before meals   glucose blood (ONE TOUCH ULTRA TEST) test strip 1 each, Other, Daily   lamoTRIgine (LAMICTAL) 200 mg, Oral, 2 times daily   Lancets (ONETOUCH ULTRASOFT) lancets USE AS INSTRUCTED   levothyroxine (SYNTHROID) 175 mcg, Oral, Daily before breakfast   olmesartan-hydrochlorothiazide (BENICAR HCT) 40-12.5 MG tablet 1 tablet, Oral, BH-each morning   PARoxetine (PAXIL) 60 mg, Oral, Daily    Radiology:   No results found.  Cardiac Studies:   PCV MYOCARDIAL PERFUSION WO LEXISCAN 12/19/2020  Narrative Lexiscan Tetrofosmin stress test 12/19/2020: 1 Day Rest/Stress Protocol. Stress EKG is non-diagnostic, as this is  pharmacological stress test. Small size, mild intensity, fixed perfusion defect most likely secondary to soft tissue attenuation (images obtained while sitting upright and BMI >30).  Without convincing evidence of reversible myocardial ischemia or prior infarct. LVEF 56%, LV size mildly dilated, wall thickness is preserved without regional wall motion abnormalities. No prior studies available for comparison. Low risk study.   PCV ECHOCARDIOGRAM COMPLETE 12/27/2020  Narrative Echocardiogram 12/27/2020: Left ventricle cavity is normal in size. Moderate concentric hypertrophy of the left ventricle. Normal global wall motion.  Normal LV systolic function with EF 68%. Doppler evidence of grade I (impaired) diastolic dysfunction, normal LAP. Left atrial cavity is borderline dilated. Trileaflet aortic valve.  Mild (Grade I) aortic regurgitation. Mild (Grade I) mitral regurgitation. Mild tricuspid regurgitation. No evidence of pulmonary hypertension.  Zio Patch Extended out patient EKG monitoring 7 days starting 12/27/2020: Predominant rhythm is normal sinus rhythm.  There were 3 brief atrial tachycardia episodes.  There were occasional PACs, rare PVCs. There were no diary entries however triggered events correlated with PACs and PVCs.  There was no atrial fibrillation, there was no heart block, no complex ventricular arrhythmias.    Carotid artery duplex 12/27/2020: Duplex suggests stenosis in the right common carotid artery bifurcation (>50%) with heterogeneous plaque.  Duplex suggests stenosis in the right common carotid artery (<50%) with homogeneous plaque. Duplex suggests stenosis in the right external carotid artery (<50%) with heterogeneous plaque. Duplex suggests stenosis in the left internal carotid artery (50-69%) with heterogeneous plaque. Duplex suggests stenosis in the left common carotid artery (<50%) with homogeneous plaque. Duplex suggests stenosis in the left external carotid artery (<50%) with heterogeneous plaque. Antegrade right vertebral artery flow. Antegrade left vertebral artery flow. Follow up in six months is appropriate if clinically indicated  EKG:   EKG 12/06/2020: Normal sinus rhythm at the rate of 71 bpm, normal axis, incomplete right bundle branch block.  No evidence of ischemia otherwise normal EKG.    Assessment     ICD-10-CM   1. Primary hypertension  I10 olmesartan-hydrochlorothiazide (BENICAR HCT) 40-12.5 MG tablet    2. Asymptomatic bilateral carotid artery stenosis  I65.23     3. Tobacco use disorder  F17.200     4. Dyspnea on exertion  R06.09 DG Chest 2 View    5. Type 2  diabetes mellitus with hyperglycemia, without long-term current use of insulin (HCC)  E11.65 Ambulatory referral to Nutrition and Diabetic Education       Medications Discontinued During This Encounter  Medication Reason   nicotine (NICODERM CQ - DOSED IN MG/24 HOURS) 21 mg/24hr patch    nicotine (NICODERM CQ) 14 mg/24hr patch    nicotine (NICODERM CQ) 7 mg/24hr patch    predniSONE (STERAPRED UNI-PAK 21 TAB) 10 MG (21) TBPK tablet    olmesartan-hydrochlorothiazide (BENICAR HCT) 20-12.5 MG tablet Dose change     Meds ordered this encounter  Medications   olmesartan-hydrochlorothiazide (BENICAR HCT) 40-12.5 MG tablet    Sig: Take 1 tablet by mouth every morning.    Dispense:  100 tablet    Refill:  3   Orders Placed This Encounter  Procedures   DG Chest 2 View    Standing Status:   Future    Standing Expiration Date:   06/21/2021    Order Specific Question:   Reason for Exam (SYMPTOM  OR DIAGNOSIS REQUIRED)    Answer:   60 pack year history of smoking, dyspnea    Order Specific Question:   Preferred imaging location?  Answer:   [id number]    Order Specific Question:   Radiology Contrast Protocol - do NOT remove file path    Answer:   [email or web address]   Ambulatory referral to Nutrition and Diabetic Education    Referral Priority:   Routine    Referral Type:   Consultation    Referral Reason:   Specialty Services Required    Number of Visits Requested:   1   Recommendations:   Robert Wise is a 61 y.o. 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  He has 50-60+ year history of smoking cigarettes.  Since last office visit 3 months ago he has not had any  further episodes of chest pain.  Dyspnea is remained stable   With regard to hyperlipidemia, elevated triglycerides are related to uncontrolled diabetes which should improve.  If with diet changes that do not improve, we could consider Vascepa.  I have again made a referral for diabetes education at his request.  He will eventually need lipid profile to be rechecked at some point, he has an appointment to follow-up with Dr. Carlota Raspberry.  Blood pressure was well controlled on olmesartan HCT, however elevated today, will increase to 40/12.5 mg of olmesartan HCT. Patient has asymptomatic bilateral carotid stenosis.  He will need continued surveillance of carotid disease.  Smoking cessation again discussed, patient has 60+ pack year history of smoking, will obtain a chest x-ray.  Could consider low-dose CT scan if appropriate.   Adrian Prows, MD, [facility] 04/23/2021, 10:55 AM Office: [phone]

## 2021-05-03 DIAGNOSIS — H25812 Combined forms of age-related cataract, left eye: Secondary | ICD-10-CM | POA: Diagnosis not present

## 2021-05-03 LAB — HM DIABETES EYE EXAM

## 2021-05-21 ENCOUNTER — Encounter: Payer: Self-pay | Admitting: Physician Assistant

## 2021-05-21 ENCOUNTER — Other Ambulatory Visit: Payer: Self-pay

## 2021-05-21 ENCOUNTER — Ambulatory Visit (INDEPENDENT_AMBULATORY_CARE_PROVIDER_SITE_OTHER): Payer: Self-pay | Admitting: Physician Assistant

## 2021-05-21 DIAGNOSIS — G2581 Restless legs syndrome: Secondary | ICD-10-CM

## 2021-05-21 DIAGNOSIS — G4733 Obstructive sleep apnea (adult) (pediatric): Secondary | ICD-10-CM

## 2021-05-21 DIAGNOSIS — G4709 Other insomnia: Secondary | ICD-10-CM

## 2021-05-21 DIAGNOSIS — F3341 Major depressive disorder, recurrent, in partial remission: Secondary | ICD-10-CM

## 2021-05-21 DIAGNOSIS — F411 Generalized anxiety disorder: Secondary | ICD-10-CM

## 2021-05-21 MED ORDER — LAMOTRIGINE 200 MG PO TABS: 200.0000 mg | ORAL_TABLET | Freq: Two times a day (BID) | ORAL | 1 refills | Status: DC

## 2021-05-21 MED ORDER — BUSPIRONE HCL 30 MG PO TABS: 30.0000 mg | ORAL_TABLET | Freq: Two times a day (BID) | ORAL | 1 refills | Status: DC

## 2021-05-21 MED ORDER — GABAPENTIN 600 MG PO TABS: 600.0000 mg | ORAL_TABLET | Freq: Every day | ORAL | 1 refills | Status: DC

## 2021-05-21 MED ORDER — PAROXETINE HCL 30 MG PO TABS: 60.0000 mg | ORAL_TABLET | Freq: Every day | ORAL | 1 refills | Status: DC

## 2021-05-21 NOTE — Progress Notes (Signed)
[facility] Med Check  Patient ID: Robert Wise,  MRN: [id number]  PCP: [name], MD  Date of Evaluation: 05/21/2021 Time spent:30 minutes  Chief Complaint:  Chief Complaint   Anxiety; Depression; Follow-up      HISTORY/CURRENT STATUS: HPI For routine med check.  States his wife says he's been more sluggish and irritable for about 3 weeks. Hasn't been sleeping well, maybe 4-5 hours/night. Not using his CPAP consistently though. Aggravating to him, he can go to sleep okay most nights but wakes up and has a hard time going back to sleep.  Even when he is using the CPAP, he wakes up often.  Sometimes the air leaks around the mask, plus it is hard to keep it stable on his head.  So he does not put it back on when he gets up.  Also complains of restlessness in his legs.  This has been going on a while, he is unable to tell me how long.  It has gotten to the point that his wife goes in to another room and sleeps.  States that the restlessness causes him to throw the covers off of him.  Patient denies loss of interest in usual activities and is able to enjoy things.  Denies decreased energy or motivation.  Appetite has not changed.  No extreme sadness, tearfulness, or feelings of hopelessness.  Denies any changes in concentration, making decisions or remembering things.  Work is going well.  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  Denies suicidal or homicidal thoughts.  Patient denies increased energy with decreased need for sleep, no increased talkativeness, no racing thoughts, no impulsivity or risky behaviors, no increased spending, no increased libido, no grandiosity, no paranoia, and no hallucinations.  Denies dizziness, syncope, seizures, numbness, tingling, tremor, tics, unsteady gait, slurred speech, confusion. Denies muscle or joint pain, stiffness, or dystonia.  His PCP takes care of cholesterol and glucose.  Individual Medical History/ Review of  Systems: Changes? :Yes   bilat cataracts removed.   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 Use up to four times daily as directed. (FOR ICD-9 250.00, 250.01)., Disp: 1 each, Rfl: 0   Blood Glucose Monitoring Suppl (ONE TOUCH ULTRA SYSTEM KIT) W/DEVICE KIT, 1 kit by Does not apply route once., Disp: 1 each, Rfl: 0   cholecalciferol (VITAMIN D3) 25 MCG (1000 UT) tablet, Take 2,000 Units by mouth daily., Disp: , Rfl:    Dapagliflozin-metFORMIN HCl ER (XIGDUO XR) 07-998 MG TB24, Take 2 tablets by mouth daily., Disp: 180 tablet, Rfl: 1   esomeprazole (NEXIUM) 40 MG capsule, Take 1 capsule (40 mg total) by mouth daily., Disp: 90 capsule, Rfl: 3   ferrous sulfate 325 (  65 FE) MG tablet, Take 65 mg by mouth daily with breakfast., Disp: , Rfl:    fish oil-omega-3 fatty acids 1000 MG capsule, Take 2 g by mouth daily., Disp: , Rfl:    gabapentin (NEURONTIN) 600 MG tablet, Take 1 tablet (600 mg total) by mouth at bedtime. Take around 3-4 hours before bedtime., Disp: 30 tablet, Rfl: 1   Ginkgo Biloba Extract 60 MG CAPS, Take by mouth., Disp: , Rfl:    glipiZIDE (GLUCOTROL) 10 MG tablet, Take 1 tablet (10 mg total) by  mouth 2 (two) times daily before a meal., Disp: 180 tablet, Rfl: 1   glucose blood (ONE TOUCH ULTRA TEST) test strip, 1 each by Other route daily., Disp: 100 each, Rfl: 8   Lancets (ONETOUCH ULTRASOFT) lancets, USE AS INSTRUCTED, Disp: 100 each, Rfl: 0   levothyroxine (SYNTHROID) 175 MCG tablet, Take 1 tablet (175 mcg total) by mouth daily before breakfast., Disp: 90 tablet, Rfl: 3   olmesartan-hydrochlorothiazide (BENICAR HCT) 40-12.5 MG tablet, Take 1 tablet by mouth every morning., Disp: 100 tablet, Rfl: 3   busPIRone (BUSPAR) 30 MG tablet, Take 1 tablet (30 mg total) by mouth 2 (two) times daily., Disp: 180 tablet, Rfl: 1   cyclobenzaprine (FLEXERIL) 5 MG tablet, Take 5 mg by mouth 3 (three) times daily as needed for muscle spasms. (Patient not taking: Reported on 04/23/2021), Disp: , Rfl:    lamoTRIgine (LAMICTAL) 200 MG tablet, Take 1 tablet (200 mg total) by mouth 2 (two) times daily., Disp: 180 tablet, Rfl: 1   PARoxetine (PAXIL) 30 MG tablet, Take 2 tablets (60 mg total) by mouth daily., Disp: 180 tablet, Rfl: 1 Medication Side Effects: none  Family Medical/ Social History: Changes? No  MENTAL HEALTH EXAM:  There were no vitals taken for this visit.There is no height or weight on file to calculate BMI.  General Appearance: Casual, Neat and Well Groomed  Eye Contact:  Good  Speech:  Clear and Coherent and Normal Rate  Volume:  Normal  Mood:  Euthymic  Affect:  Appropriate  Thought Process:  Goal Directed and Descriptions of Associations: Circumstantial  Orientation:  Full (Time, Place, and Person)  Thought Content: Logical   Suicidal Thoughts:  No  Homicidal Thoughts:  No  Memory:  WNL  Judgement:  Good  Insight:  Good  Psychomotor Activity:  Normal  Concentration:  Concentration: Good  Recall:  Good  Fund of Knowledge: Good  Language: Good  Assets:  Desire for Improvement Financial Resources/Insurance Housing Transportation Vocational/Educational  ADL's:  Intact   Cognition: WNL  Prognosis:  Good    DIAGNOSES:    ICD-10-CM   1. Recurrent major depressive disorder, in partial remission ([place])  F33.41     2. Other insomnia  G47.09     3. Obstructive sleep apnea  G47.33     4. Generalized anxiety disorder  F41.1     5. Restless leg syndrome  G25.81         Receiving Psychotherapy: No    RECOMMENDATIONS:  PDMP was reviewed.  Last control substance was tramadol 01/27/2021. I provided 30 minutes of face to face time during this encounter, including time spent before and after the visit in records review, medical decision making, counseling pertinent to today's visit, and charting.  We discussed adding gabapentin to help with the restless leg syndrome.  Benefits, risks and side effects were discussed and he accepts.  He would like to try it.  I think if he is able to  get sleep that is more restful and for at least 7 hours he will start feeling a lot better.  Encouraged him to retry the CPAP.  If he still has trouble with the mask, contact the person who prescribed it for him.  Start gabapentin 600 mg, 1 p.o. approximately 3 to 4 hours before he wants to go to sleep. Continue BuSpar 30 mg, 1 p.o. twice daily.  Continue Lamictal 200 mg, 1 p.o. twice daily. Continue Paxil 30 mg, 2 p.o. daily. Continue multivitamin, ginkgo biloba, B complex, fish oil, vitamin D. Continue using CPAP. Return in 4-6 weeks.  Donnal Moat, PA-C

## 2021-05-28 ENCOUNTER — Other Ambulatory Visit: Payer: Self-pay | Admitting: Cardiology

## 2021-05-28 ENCOUNTER — Ambulatory Visit
Admission: RE | Admit: 2021-05-28 | Discharge: 2021-05-28 | Disposition: A | Discharge status: Home / Self Care | Payer: BC Managed Care – PPO | Source: Ambulatory Visit | Attending: Cardiology | Admitting: Cardiology

## 2021-05-28 ENCOUNTER — Other Ambulatory Visit: Payer: Self-pay

## 2021-05-28 DIAGNOSIS — R0609 Other forms of dyspnea: Secondary | ICD-10-CM

## 2021-05-28 DIAGNOSIS — F17200 Nicotine dependence, unspecified, uncomplicated: Secondary | ICD-10-CM

## 2021-05-28 DIAGNOSIS — R0602 Shortness of breath: Secondary | ICD-10-CM | POA: Diagnosis not present

## 2021-05-30 NOTE — Progress Notes (Signed)
Mild abnormality right lungs probably from smoking changes. Do not suspect pneumonia.   CXR 2 view 05/29/2021: Subtle increased markings in the medial right lower lung fields suggest subsegmental atelectasis/pneumonia. There is no pleural effusion or pneumothorax.

## 2021-06-25 DIAGNOSIS — L03115 Cellulitis of right lower limb: Secondary | ICD-10-CM | POA: Diagnosis not present

## 2021-06-27 ENCOUNTER — Other Ambulatory Visit: Payer: Self-pay | Admitting: Family Medicine

## 2021-06-27 DIAGNOSIS — E1165 Type 2 diabetes mellitus with hyperglycemia: Secondary | ICD-10-CM

## 2021-07-02 ENCOUNTER — Encounter: Payer: Self-pay | Admitting: Family Medicine

## 2021-07-02 ENCOUNTER — Ambulatory Visit: Payer: BC Managed Care – PPO | Admitting: Family Medicine

## 2021-07-02 VITALS — BP 130/78 | HR 71 | Temp 98.0°F | Resp 16 | Ht 72.0 in | Wt 229.8 lb

## 2021-07-02 DIAGNOSIS — I10 Essential (primary) hypertension: Secondary | ICD-10-CM

## 2021-07-02 DIAGNOSIS — E782 Mixed hyperlipidemia: Secondary | ICD-10-CM

## 2021-07-02 DIAGNOSIS — F17210 Nicotine dependence, cigarettes, uncomplicated: Secondary | ICD-10-CM

## 2021-07-02 DIAGNOSIS — E039 Hypothyroidism, unspecified: Secondary | ICD-10-CM

## 2021-07-02 DIAGNOSIS — E1165 Type 2 diabetes mellitus with hyperglycemia: Secondary | ICD-10-CM | POA: Diagnosis not present

## 2021-07-02 DIAGNOSIS — L03031 Cellulitis of right toe: Secondary | ICD-10-CM

## 2021-07-02 DIAGNOSIS — Z122 Encounter for screening for malignant neoplasm of respiratory organs: Secondary | ICD-10-CM

## 2021-07-02 LAB — LIPID PANEL
Cholesterol: 141 mg/dL (ref 0–200)
HDL: 27.6 mg/dL — ABNORMAL LOW (ref >39.00)
NonHDL: 113.01
Total CHOL/HDL Ratio: 5
Triglycerides: 219 mg/dL — ABNORMAL HIGH (ref 0.0–149.0)
VLDL: 43.8 mg/dL — ABNORMAL HIGH (ref 0.0–40.0)

## 2021-07-02 LAB — COMPREHENSIVE METABOLIC PANEL
ALT: 30 U/L (ref 0–53)
AST: 18 U/L (ref 0–37)
Albumin: 4.6 g/dL (ref 3.5–5.2)
Alkaline Phosphatase: 79 U/L (ref 39–117)
BUN: 17 mg/dL (ref 6–23)
CO2: 25 mEq/L (ref 19–32)
Calcium: 9.8 mg/dL (ref 8.4–10.5)
Chloride: 101 mEq/L (ref 96–112)
Creatinine, Ser: 0.9 mg/dL (ref 0.40–1.50)
GFR: 92.62 mL/min (ref >60.00)
Glucose, Bld: 201 mg/dL — ABNORMAL HIGH (ref 70–99)
Potassium: 4.6 mEq/L (ref 3.5–5.1)
Sodium: 137 mEq/L (ref 135–145)
Total Bilirubin: 0.5 mg/dL (ref 0.2–1.2)
Total Protein: 6.9 g/dL (ref 6.0–8.3)

## 2021-07-02 LAB — HEMOGLOBIN A1C: Hgb A1c MFr Bld: 8.3 % — ABNORMAL HIGH (ref 4.6–6.5)

## 2021-07-02 LAB — LDL CHOLESTEROL, DIRECT: Direct LDL: 80 mg/dL

## 2021-07-02 LAB — TSH: TSH: 4.08 u[IU]/mL (ref 0.35–5.50)

## 2021-07-02 MED ORDER — GLIPIZIDE 10 MG PO TABS: 10.0000 mg | ORAL_TABLET | Freq: Two times a day (BID) | ORAL | 1 refills | Status: DC

## 2021-07-02 MED ORDER — XIGDUO XR 5-1000 MG PO TB24: 2.0000 | ORAL_TABLET | Freq: Every day | ORAL | 1 refills | Status: DC

## 2021-07-02 MED ORDER — ATORVASTATIN CALCIUM 80 MG PO TABS: 80.0000 mg | ORAL_TABLET | Freq: Every day | ORAL | 2 refills | Status: DC

## 2021-07-02 NOTE — Progress Notes (Signed)
? ?Subjective:  ?Patient ID: Robert Wise, male    DOB: [birth year]  Age: 61 y.o. MRN: [id number] ? ?CC:  ?Chief Complaint  ?Patient presents with  ? Nail Problem  ?  Pt went to urgent care, was dx with infection in his 2nd and 3rd toe on Rt foot reports doing much better.   ? Diabetes  ?  Due for recheck was meant to follow up sooner but pt had been unable until recently   ? ? ?HPI ?Robert Wise presents for  ?Diabetes: ?Complicated by hyperglycemia, microalbuminuria.  Last visit in August 2022.  Uncontrolled at that time.  Was taking Xigduo 12/1998 mg daily, glipizide 5 mg twice daily, and was on statin/ACE inhibitor.  Some delay on follow-up at that visit - A1c had not been checked since previous August 2021.  We discussed follow-up intervals for diabetes, especially with uncontrolled diabetes.  Based on level of elevation recommended follow-up in office within a few weeks with his home readings to decide the next step.  First visit back since August 2022. Denies barriers to care.  ? ?Now on glipizide 54m BID, xigduo 10/2000mg qd.  ?No missed doses. No new side effects.  ?Lowest 70-90. Feels funny in stomach - occurs once per month. ?Fasting 130's ?Postprandial - 200-220.  ?Appt with nutritionist this month.  ?No burning in feet, loss of feeling.  ?Microalbumin: ratio 38 in 02/18/19 ?Optho, foot exam, pneumovax:  ?Due for ophthalmology exam -  appt with optho later this month for cataract follow up.  ?Diabetic Foot Exam - Simple   ?Simple Foot Form ?Visual Inspection ?No deformities, no ulcerations, no other skin breakdown bilaterally: Yes ?Sensation Testing ?Intact to touch and monofilament testing bilaterally: Yes ?Pulse Check ?Posterior Tibialis and Dorsalis pulse intact bilaterally: Yes ?Comments ?Normal visual and sensation exam  ?  ? ? ?Lab Results  ?Component Value Date  ? HGBA1C 9.4 (H) 11/27/2020  ? HGBA1C 8.6 (H) 11/08/2019  ? HGBA1C 8.3 (H) 02/18/2019  ? ?Lab Results  ?Component Value Date  ?  MICROALBUR 2.1 (H) 08/01/2014  ? LSeven Points77 12/20/2019  ? CREATININE 1.08 01/16/2021  ? ? ?Hypertension: ?Seen by cardiology January 23.  Hypertriglyceridemia thought to be due to diabetes.  Considered Vascepa but referred to diabetes education.  Plan for repeat lipid profile with me.  Uncontrolled hypertension at that visit, olmesartan HCT increased to 40/12.5 mg daily. ?No new side effects.  ?Home readings: ?BP Readings from Last 3 Encounters:  ?07/02/21 130/78  ?04/23/21 (!) 145/78  ?01/22/21 139/72  ? ?Lab Results  ?Component Value Date  ? CREATININE 1.08 01/16/2021  ? ?Nicotine addiction ?Smoking cessation was discussed at his most recent cardiology visit. 1.5ppd. 60+ pack year history.  Chest x-ray 05/29/2021.  Subtle increased markings medial right lower lung fields suggesting subsegmental atelectasis versus pneumonia, no pleural effusion or pneumothorax.  No previous low-dose CT for lung cancer screening.  ?Not ready to quit smoking at this time.  ? ?Hyperlipidemia: ?Lipitor 861mqd. No new myalgias.  ?Lab Results  ?Component Value Date  ? CHOL 144 11/27/2020  ? HDL 24.70 (L) 11/27/2020  ? LD[place]7 12/20/2019  ? LDLDIRECT 78.0 11/27/2020  ? TRIG 390.0 (H) 11/27/2020  ? CHOLHDL 6 11/27/2020  ? ?Lab Results  ?Component Value Date  ? ALT 14 11/27/2020  ? AST 13 11/27/2020  ? ALKPHOS 87 11/27/2020  ? BILITOT 0.4 11/27/2020  ? ?Hypothyroidism: ?Lab Results  ?Component Value Date  ? TSH 0.83 11/27/2020  ?  Taking medication daily. Synthroid 166mg qd.  ?No new hot or cold intolerance. No new hair or skin changes, heart palpitations or new fatigue. No new weight changes.  ? ? ?Toe infection: ?Urgent care eval few weeks ago.  ?R foot, 2nd-3rd toes. Improved with abx, swelling and redness improved.  ? ? ? ?History ?Patient Active Problem List  ? Diagnosis Date Noted  ? Anxiety state 01/25/2018  ? Fatigue 01/25/2018  ? Uncontrolled type 2 diabetes mellitus with complication, without long-term current use of insulin  02/10/2015  ? Chronic pain 12/01/2014  ? Depression 10/12/2014  ? ?Past Medical History:  ?Diagnosis Date  ? Anxiety   ? Depression   ? [place]   ? Hyperlipidemia   ? OSA (obstructive sleep apnea) 12/15/2012  ? Sleep apnea   ? ?Past Surgical History:  ?Procedure Laterality Date  ? ANKLE SURGERY Left 04/01/2004  ? CATARACT EXTRACTION Right 04/2021  ? VASECTOMY    ? ?Allergies  ?Allergen Reactions  ? Tricor [Fenofibrate]   ? Zocor [Simvastatin]   ? ?Prior to Admission medications   ?Medication Sig Start Date End Date Taking? Authorizing Provider  ?aspirin (ASPIRIN CHILDRENS) 81 MG chewable tablet Chew 1 tablet (81 mg total) by mouth daily. 12/06/20  Yes GAdrian Prows MD  ?atorvastatin (LIPITOR) 80 MG tablet Take 1 tablet (80 mg total) by mouth daily. 11/27/20  Yes GWendie Agreste MD  ?B Complex Vitamins (B-COMPLEX/B-12 PO) Take by mouth.   Yes [provider]  ?blood glucose meter kit and supplies KIT Dispense based on patient and insurance preference. Use up to four times daily as directed. (FOR ICD-9 250.00, 250.01). 01/07/19  Yes SForrest Moron MD  ?Blood Glucose Monitoring Suppl (ONE TOUCH ULTRA SYSTEM KIT) W/DEVICE KIT 1 kit by Does not apply route once. 10/25/11  Yes Weber, SDamaris Hippo PA-C  ?busPIRone (BUSPAR) 30 MG tablet Take 1 tablet (30 mg total) by mouth 2 (two) times daily. 05/21/21  Yes HDonnal MoatT, PA-C  ?cholecalciferol (VITAMIN D3) 25 MCG (1000 UT) tablet Take 2,000 Units by mouth daily.   Yes [provider]  ?Dapagliflozin-metFORMIN HCl ER (XIGDUO XR) 07-998 MG TB24 Take 2 tablets by mouth daily. 11/27/20  Yes GWendie Agreste MD  ?esomeprazole (NEXIUM) 40 MG capsule Take 1 capsule (40 mg total) by mouth daily. 04/11/17  Yes MJaynee Eagles PA-C  ?ferrous sulfate 325 (65 FE) MG tablet Take 65 mg by mouth daily with breakfast.   Yes [provider]  ?fish oil-omega-3 fatty acids 1000 MG capsule Take 2 g by mouth daily.   Yes [provider]  ?Ginkgo Biloba Extract 60 MG CAPS Take by mouth.   Yes [provider]  ?glipiZIDE (GLUCOTROL) 10 MG tablet TAKE 1 TABLET (10 MG TOTAL) BY MOUTH 2 (TWO) TIMES DAILY BEFORE A MEAL. 06/27/21  Yes GWendie Agreste MD  ?glucose blood (ONE TOUCH ULTRA TEST) test strip 1 each by Other route daily. 12/01/14  Yes MJaynee Eagles PA-C  ?lamoTRIgine (LAMICTAL) 200 MG tablet Take 1 tablet (200 mg total) by mouth 2 (two) times daily. 05/21/21  Yes HAddison Lank PA-C  ?Lancets (ONETOUCH ULTRASOFT) lancets USE AS INSTRUCTED 11/28/13  Yes LRobyn Haber MD  ?levothyroxine (SYNTHROID) 175 MCG tablet Take 1 tablet (175 mcg total) by mouth daily before breakfast. 11/27/20  Yes GWendie Agreste MD  ?olmesartan-hydrochlorothiazide (BENICAR HCT) 40-12.5 MG tablet Take 1 tablet by mouth every morning. 04/23/21  Yes GAdrian Prows MD  ?  PARoxetine (PAXIL) 30 MG tablet Take 2 tablets (60 mg total) by mouth daily. 05/21/21  Yes Donnal Moat T, PA-C  ?cyclobenzaprine (FLEXERIL) 5 MG tablet Take 5 mg by mouth 3 (three) times daily as needed for muscle spasms. ?Patient not taking: Reported on 04/23/2021    [provider]  ?gabapentin (NEURONTIN) 600 MG tablet Take 1 tablet (600 mg total) by mouth at bedtime. Take around 3-4 hours before bedtime. ?Patient not taking: Reported on 07/02/2021 05/21/21   Addison Lank, PA-C  ? ?Social History  ? ?Socioeconomic History  ? Marital status: Married  ?  Spouse name: [name]  ? Number of children: 3  ? Years of education: 11.5  ? Highest education level: Not on file  ?Occupational History  ?  Employer: [employer]  ?  Comment: Driver  ?Tobacco Use  ? Smoking status: Every Day  ?  Packs/day: 1.50  ?  Years: 37.00  ?  Pack years: 55.50  ?  Types: Cigarettes  ?  Start date: 04/02/2019  ? Smokeless tobacco: Never  ?Vaping Use  ? Vaping Use: Never used  ?Substance and Sexual Activity  ? Alcohol use: No  ?  Comment: quit: 08/14/2011  ? Drug use: No  ? Sexual activity: Yes  ?  Birth  control/protection: None  ?Other Topics Concern  ? Not on file  ?Social History Narrative  ? Patient lives at home with family.   ? Caffeine Use: 1 pot of coffee daily  ? ?Social Determinants of Health  ? ?[place]

## 2021-07-02 NOTE — Patient Instructions (Addendum)
Make sure your eye specialist sends me a report of your diabetic eye screening.  No change for now until we see your lab results and can discuss medication changes at the follow-up visit in the next 2 weeks. ?Toes look like they are healing.  If any increased redness or discharge to be seen right leg.  Thanks for coming in today and let me know if there are questions ? ?Type 2 Diabetes Mellitus, Self-Care, Adult ?Caring for yourself after you have been diagnosed with type 2 diabetes (type 2 diabetes mellitus) means keeping your blood sugar (glucose) under control with a balance of: ?Nutrition. ?Exercise. ?Lifestyle changes. ?Medicines or insulin, if needed. ?Support from your team of health care providers and others. ?What are the risks? ?Having type 2 diabetes can put you at risk for other long-term (chronic) conditions, such as heart disease and kidney disease. Your health care provider may prescribe medicines to help prevent complications from diabetes. ?How to monitor your blood glucose ? ?Check your blood glucose every day or as often as told by your health care provider. ?Have your A1C (hemoglobin A1C) level checked two or more times a year, or as often as told by your health care provider. ?Your health care provider will set personalized treatment goals for you. Generally, the goal of treatment is to maintain the following blood glucose levels: ?Before meals: 80-130 mg/dL (4.4-7.2 mmol/L). ?After meals: below 180 mg/dL (10 mmol/L). ?A1C level: less than 7%. ?How to manage hyperglycemia and hypoglycemia ?Hyperglycemia symptoms ?Hyperglycemia, also called high blood glucose, occurs when blood glucose is too high. Make sure you know the early signs of hyperglycemia, such as: ?Increased thirst. ?Hunger. ?Feeling very tired. ?Needing to urinate more often than usual. ?Blurry vision. ?Hypoglycemia symptoms ?Hypoglycemia, also called low blood glucose, occurs with a blood glucose level at or below 70 mg/dL (3.9  mmol/L). Diabetes medicines lower your blood glucose and can cause hypoglycemia. The risk for hypoglycemia increases during or after exercise, during sleep, during illness, and when skipping meals or not eating for a long time (fasting). ?It is important to know the symptoms of hypoglycemia and treat it right away. Always have a 15-gram rapid-acting carbohydrate snack with you to treat low blood glucose. Family members and close friends should also know the symptoms and understand how to treat hypoglycemia, in case you are not able to treat yourself. Symptoms may include: ?Hunger. ?Anxiety. ?Sweating and feeling clammy. ?Dizziness or feeling light-headed. ?Sleepiness. ?Increased heart rate. ?Irritability. ?Tingling or numbness around the mouth, lips, or tongue. ?Restless sleep. ?Severe hypoglycemia is when your blood glucose level is at or below 54 mg/dL (3 mmol/L). ?Severe hypoglycemia is an emergency. Do not wait to see if the symptoms will go away. Get medical help right away. Call your local emergency services (911 in the [place] Do not drive yourself to the hospital. ?If you have severe hypoglycemia and you cannot eat or drink, you may need glucagon. A family member or close friend should learn how to check your blood glucose and how to give you glucagon. Ask your health care provider if you need to have an emergency glucagon kit available. ?Follow these instructions at home: ?Medicines ?Take prescribed insulin or diabetes medicines as told by your health care provider. ?Do not run out of insulin or other diabetes medicines. Plan ahead so you always have these available. ?If you use insulin, adjust your dosage based on your physical activity and what foods you eat. Your health care provider will tell  you how to adjust your dosage. ?Take over-the-counter and prescription medicines only as told by your health care provider. ?Eating and drinking ?What you eat and drink affects your blood glucose and your insulin  dosage. Making good choices helps to control your diabetes and prevent other health problems. A healthy meal plan includes eating lean proteins, complex carbohydrates, fresh fruits and vegetables, low-fat dairy products, and healthy fats. ?Make an appointment to see a registered dietitian to help you create an eating plan that is right for you. Make sure that you: ?Follow instructions from your health care provider about eating or drinking restrictions. ?Drink enough fluid to keep your urine pale yellow. ?Keep a record of the carbohydrates that you eat. Do this by reading food labels and learning the standard serving sizes of foods. ?Follow your sick-day plan whenever you cannot eat or drink as usual. Make this plan in advance with your health care provider. ? ?Activity ?Stay active. Exercise regularly, as told by your health care provider. This may include: ?Stretching and doing strength exercises, such as yoga or weight lifting, two or more times a week. ?Doing 150 minutes or more of moderate-intensity or vigorous-intensity exercise each week. This could be brisk walking, biking, or water aerobics. ?Spread out your activity over 3 or more days of the week. ?Do not go more than 2 days in a row without doing some kind of physical activity. ?When you start a new exercise or activity, work with your health care provider to adjust your insulin, medicines, or food intake as needed. ?Lifestyle ?Do not use any products that contain nicotine or tobacco. These products include cigarettes, chewing tobacco, and vaping devices, such as e-cigarettes. If you need help quitting, ask your health care provider. ?If you drink alcohol and your health care provider says that it is safe for you: ?Limit how much you have to: ?0-1 drink a day for women who are not pregnant. ?0-2 drinks a day for men. ?Know how much alcohol is in your drink. In the [place] one drink equals one 12 oz bottle of beer (355 mL), one 5 oz glass of wine (148 mL),  or one 1? oz glass of hard liquor (44 mL). ?Learn to manage stress. If you need help with this, ask your health care provider. ?Take care of your body ? ?Keep your immunizations up to date. In addition to getting vaccinations as told by your health care provider, it is recommended that you get vaccinated against the following illnesses: ?The flu (influenza). Get a flu shot every year. ?Pneumonia. ?Hepatitis B. ?Schedule an eye exam soon after your diagnosis, and then one time every year after that. ?Check your skin and feet every day for cuts, bruises, redness, blisters, or sores. Schedule a foot exam with your health care provider once every year. ?Brush your teeth and gums two times a day, and floss one or more times a day. Visit your dentist one or more times every 6 months. ?Maintain a healthy weight. ?General instructions ?Share your diabetes management plan with people in your workplace, school, and household. ?Carry a medical alert card or wear medical alert jewelry. ?Keep all follow-up visits. This is important. ?Questions to ask your health care provider ?Should I meet with a certified diabetes care and education specialist? ?Where can I find a support group for people with diabetes? ?Where to find more information ?For help and guidance and for more information about diabetes, please visit: ?American Diabetes Association (ADA): www.diabetes.org ?American Association of Diabetes  Care and Education Specialists (ADCES): www.diabeteseducator.org ?International Diabetes Federation (IDF): MemberVerification.ca ?Summary ?Caring for yourself after you have been diagnosed with type 2 diabetes (type 2 diabetes mellitus) means keeping your blood sugar (glucose) under control with a balance of nutrition, exercise, lifestyle changes, and medicine. ?Check your blood glucose every day, as often as told by your health care provider. ?Having diabetes can put you at risk for other long-term (chronic) conditions, such as heart disease  and kidney disease. Your health care provider may prescribe medicines to help prevent complications from diabetes. ?Share your diabetes management plan with people in your workplace, school, and household.

## 2021-07-03 ENCOUNTER — Ambulatory Visit (INDEPENDENT_AMBULATORY_CARE_PROVIDER_SITE_OTHER): Payer: BC Managed Care – PPO | Admitting: Physician Assistant

## 2021-07-03 ENCOUNTER — Encounter: Payer: Self-pay | Admitting: Physician Assistant

## 2021-07-03 DIAGNOSIS — G4733 Obstructive sleep apnea (adult) (pediatric): Secondary | ICD-10-CM | POA: Diagnosis not present

## 2021-07-03 DIAGNOSIS — G2581 Restless legs syndrome: Secondary | ICD-10-CM

## 2021-07-03 DIAGNOSIS — F3341 Major depressive disorder, recurrent, in partial remission: Secondary | ICD-10-CM

## 2021-07-03 DIAGNOSIS — F411 Generalized anxiety disorder: Secondary | ICD-10-CM | POA: Diagnosis not present

## 2021-07-03 DIAGNOSIS — F17200 Nicotine dependence, unspecified, uncomplicated: Secondary | ICD-10-CM

## 2021-07-03 LAB — MICROALBUMIN / CREATININE URINE RATIO
Creatinine,U: 140.4 mg/dL
Microalb Creat Ratio: 2.7 mg/g (ref 0.0–30.0)
Microalb, Ur: 3.7 mg/dL — ABNORMAL HIGH (ref 0.0–1.9)

## 2021-07-03 NOTE — Progress Notes (Signed)
[facility] Check ? ?Patient ID: Robert Wise,  ?MRN: [id number] ? ?PCP: [name], MD ? ?Date of Evaluation: 07/03/2021 ?Time spent:30 minutes ? ?Chief Complaint:  ?Chief Complaint   ?Anxiety; Depression; Insomnia; Follow-up ?  ? ? ?HISTORY/CURRENT STATUS: ?HPI For routine med check. ? ?Didn't start Gabapentin. Wife looked it up online and said it's a high dose so 'wouldn't let him take it.' Not using the CPAP nightly. Wife moved into another bedroom sometime ago because he was snoring and would not always use the CPAP.  Since then he uses it even less because it does not matter.  He does not remember having his legs jump around when he did use it.  He felt a lot better when he did.  States he does not want to put it back on after he gets up to go to the bathroom a couple of times during the night.  Sleeps about 4 to 5 hours a night.  But when he is off work on Sundays he will sleep all day sometimes, "catching up."  States he does get more irritable with people sometimes but it might be related to lack of sleep.  He is not really sure. ?  ?Patient denies loss of interest in usual activities and is able to enjoy things.  Denies decreased energy or motivation.  Appetite has not changed.  No extreme sadness, tearfulness, or feelings of hopelessness.  Denies any changes in concentration, making decisions or remembering things.  Denies suicidal or homicidal thoughts. ? ?Patient denies increased energy with decreased need for sleep, no increased talkativeness, no racing thoughts, no impulsivity or risky behaviors, no increased spending, no increased libido, no grandiosity, no paranoia, and no hallucinations. ? ?Denies dizziness, syncope, seizures, numbness, tingling, tremor, tics, unsteady gait, slurred speech, confusion. Denies muscle or joint pain, stiffness, or dystonia.  His PCP takes care of cholesterol and glucose. ? ?Individual Medical History/ Review of Systems: Changes? :No   he sees  endocrinology for diabetes. ? ? 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 ?Allergies: Tricor [fenofibrate] and Zocor [simvastatin] ? ?Current Medications:  ?Current Outpatient Medications:  ?  aspirin (ASPIRIN CHILDRENS) 81 MG chewable tablet, Chew 1 tablet (81 mg total) by mouth daily., Disp: , Rfl:  ?  atorvastatin (LIPITOR) 80 MG tablet, Take 1 tablet (80 mg total) by mouth daily., Disp: 90 tablet, Rfl: 2 ?  B Complex Vitamins (B-COMPLEX/B-12 PO), Take by mouth., Disp: , Rfl:  ?  blood glucose meter kit and supplies KIT, Dispense based on patient and insurance preference. Use up to four times daily as directed. (FOR ICD-9 250.00, 250.01)., Disp: 1 each, Rfl: 0 ?  Blood Glucose Monitoring Suppl (ONE TOUCH ULTRA SYSTEM KIT) W/DEVICE KIT, 1 kit by Does not apply route once., Disp: 1 each, Rfl: 0 ?  busPIRone (BUSPAR) 30 MG tablet, Take 1 tablet (30 mg total) by mouth 2 (two) times daily., Disp: 180 tablet, Rfl: 1 ?  cholecalciferol (VITAMIN D3) 25 MCG (1000 UT) tablet, Take 2,000 Units by mouth daily., Disp: , Rfl:  ?  Dapagliflozin-metFORMIN HCl ER (XIGDUO XR) 07-998 MG TB24, Take 2 tablets by mouth daily., Disp: 180 tablet, Rfl: 1 ?  esomeprazole (NEXIUM) 40 MG capsule,  Take 1 capsule (40 mg total) by mouth daily., Disp: 90 capsule, Rfl: 3 ?  ferrous sulfate 325 (65 FE) MG tablet, Take 65 mg by mouth daily with breakfast., Disp: , Rfl:  ?  fish oil-omega-3 fatty acids 1000 MG capsule, Take 2 g by mouth daily., Disp: , Rfl:  ?  Ginkgo Biloba Extract 60 MG CAPS, Take by mouth., Disp: , Rfl:  ?  glipiZIDE (GLUCOTROL) 10 MG tablet, Take 1 tablet (10 mg total) by mouth 2 (two) times daily before a meal., Disp: 180  tablet, Rfl: 1 ?  glucose blood (ONE TOUCH ULTRA TEST) test strip, 1 each by Other route daily., Disp: 100 each, Rfl: 8 ?  lamoTRIgine (LAMICTAL) 200 MG tablet, Take 1 tablet (200 mg total) by mouth 2 (two) times daily., Disp: 180 tablet, Rfl: 1 ?  Lancets (ONETOUCH ULTRASOFT) lancets, USE AS INSTRUCTED, Disp: 100 each, Rfl: 0 ?  levothyroxine (SYNTHROID) 175 MCG tablet, Take 1 tablet (175 mcg total) by mouth daily before breakfast., Disp: 90 tablet, Rfl: 3 ?  PARoxetine (PAXIL) 30 MG tablet, Take 2 tablets (60 mg total) by mouth daily., Disp: 180 tablet, Rfl: 1 ?  olmesartan-hydrochlorothiazide (BENICAR HCT) 40-12.5 MG tablet, Take 1 tablet by mouth every morning., Disp: 100 tablet, Rfl: 3 ?Medication Side Effects: none ? ?Family Medical/ Social History: Changes? No ? ?MENTAL HEALTH EXAM: ? ?There were no vitals taken for this visit.There is no height or weight on file to calculate BMI.  ?General Appearance: Casual, Neat and Well Groomed  ?Eye Contact:  Good  ?Speech:  Clear and Coherent and Normal Rate  ?Volume:  Normal  ?Mood:  Euthymic  ?Affect:  Appropriate  ?Thought Process:  Goal Directed and Descriptions of Associations: Circumstantial  ?Orientation:  Full (Time, Place, and Person)  ?Thought Content: Logical   ?Suicidal Thoughts:  No  ?Homicidal Thoughts:  No  ?Memory:  WNL  ?Judgement:  Good  ?Insight:  Good  ?Psychomotor Activity:  Normal  ?Concentration:  Concentration: Good and Attention Span: Fair  ?Recall:  Good  ?Fund of Knowledge: Good  ?Language: Good  ?Assets:  Desire for Improvement ?Financial Resources/Insurance ?Housing ?Transportation ?Vocational/Educational  ?ADL's:  Intact  ?Cognition: WNL  ?Prognosis:  Good  ? ? ?DIAGNOSES:  ?  ICD-10-CM   ?1. Obstructive sleep apnea  G47.33   ?  ?2. Restless leg syndrome  G25.81   ?  ?3. Recurrent major depressive disorder, in partial remission ([name])  F33.41   ?  ?4. Generalized anxiety disorder  F41.1   ?  ?5. Smoker  F17.200   ?  ? ? ?Receiving  Psychotherapy: No  ? ? ?RECOMMENDATIONS:  ?PDMP was reviewed.  Last control substance was tramadol 01/27/2021. ?I provided 30 minutes of face to face time during this encounter, including time spent before and after the visit in records review, medical decision making, counseling pertinent to today's visit, and charting.  ?Discussed his diagnoses.  I understand his hesitancy to take another pill but did explain that the dose that was prescribed is appropriate for restless leg syndrome.  Having said that though after learning that he does not use the CPAP, I recommend he use that every night religiously which may fix the problem of the restlessness in his legs and help him sleep better in general.  That can in turn improve the irritability.  He states he will start using it.  We will hold off on the gabapentin until we see how much better he feels by  sleeping more soundly for a month.  Even if using the CPAP as directed he is still having RLS symptoms then start the gabapentin as directed.  He should call and let me know that he is doing that and I will see him sooner than planned return visit. ? ?See above for directions.  Gabapentin 600 mg, 1 p.o. approximately 3 to 4 hours before he wants to go to sleep. ?Continue BuSpar 30 mg, 1 p.o. twice daily.  ?Continue Lamictal 200 mg, 1 p.o. twice daily. ?Continue Paxil 30 mg, 2 p.o. daily. ?Continue multivitamin, ginkgo biloba, B complex, fish oil, vitamin D. ?Start using CPAP. ?Return in 3 months. ? ?Donnal Moat, PA-C  ?

## 2021-07-03 NOTE — Patient Instructions (Signed)
Use her CPAP religiously every night.  I think that will fix the problem of restlessness in your legs and will help you sleep better, but if not after 1 month then start the gabapentin that I prescribed at our last visit 6 weeks ago.  You do not have to start it if you are feeling better though.  Please call and let me know that you are starting it so I can see you back in June instead of July. ?

## 2021-07-09 ENCOUNTER — Encounter: Payer: Self-pay | Admitting: Dietician

## 2021-07-09 ENCOUNTER — Encounter: Payer: BC Managed Care – PPO | Attending: Family Medicine | Admitting: Dietician

## 2021-07-09 DIAGNOSIS — E1165 Type 2 diabetes mellitus with hyperglycemia: Secondary | ICD-10-CM | POA: Diagnosis not present

## 2021-07-09 DIAGNOSIS — Z713 Dietary counseling and surveillance: Secondary | ICD-10-CM | POA: Insufficient documentation

## 2021-07-09 DIAGNOSIS — Z6831 Body mass index (BMI) 31.0-31.9, adult: Secondary | ICD-10-CM | POA: Diagnosis not present

## 2021-07-09 NOTE — Progress Notes (Signed)
? ?Diabetes Self-Management Education ? ?Visit Type: First/Initial ? ?Appt. Start Time: 1535 Appt. End Time: 1700 ? ?07/09/2021 ? ?[name], identified by name and date of birth, is a 61 y.o. male with a diagnosis of Diabetes: Type 2.  ? ?ASSESSMENT ?Patient is here today with his son. ?He would like to learn more about eating to control his blood glucose.  He would also like to learn what to eat when he is on the road.  He is currently trying to quit smoking but currently  1 1/2 packs. ? ?History includes Type 2 Diabetes, OSA - c-pap, HLD, depression, anxiety ?Medications include:  glipizide, xigduo (dapagliflozen/metformin) ?Labs noted to include:  A1C 8.3%, GFR 92 (07/02/2021),  ? ?Patient lives with his wife and son.  She does the shopping and cooking.  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 ?Motivation keeps him from exercises.   ?Recently changed from regular soda to diet. ? ?Height 6' (1.829 m), weight 230 lb (104.3 kg). ?Body mass index is 31.19 kg/m?. ? ? Diabetes Self-Management Education - 07/09/21 1553   ? ?  ? Visit Information  ? Visit Type First/Initial   ?  ? Initial Visit  ? Diabetes Type Type 2   ? Are you currently following a meal plan? No   ? Are you taking your medications as prescribed? Yes   ? Date Diagnosed 2010   ?  ? Health Coping  ? How would you rate your overall health? Fair   ?  ? Psychosocial Assessment  ? Patient Belief/Attitude about Diabetes Defeat/Burnout   ? Self-care barriers None   ? Self-management support Doctor's office   ? Other persons present Patient;Family Member   ? Patient Concerns Nutrition/Meal planning   ? Special Needs None   ? Preferred Learning Style No preference indicated   ? Learning Readiness Ready   ? How often do you need to have someone help you when you read instructions, pamphlets, or other written materials from your doctor or pharmacy? 1 - Never   ? What is the last grade level you completed in school? 11   ?  ?  Pre-Education Assessment  ? Patient understands the diabetes disease and treatment process. Needs Review   ? Patient understands incorporating nutritional management into lifestyle. Needs Review   ? Patient undertands incorporating physical activity into lifestyle. Needs Review   ? Patient understands using medications safely. Needs Review   ? Patient understands monitoring blood glucose, interpreting and using results Needs Review   ? Patient understands prevention, detection, and treatment of acute complications. Needs Review   ? Patient understands prevention, detection, and treatment of chronic complications. Needs Review   ? Patient understands how to develop strategies to address psychosocial issues. Needs Review   ? Patient understands how to develop strategies to promote health/change behavior. Needs Review   ?  ? Complications  ? Last HgB A1C per patient/outside source 8.4 %   07/02/2021 decreased from 9.3$ 11/2020  ? How often do you check your blood sugar? 1-2 times/day   ? Fasting Blood glucose range (mg/dL) [phone]   [id number]  ? Postprandial Blood glucose range (mg/dL) >782>200   [id number]  ? Number of hypoglycemic episodes per month 0   ? Number of hyperglycemic episodes per week 7   ? Have you had a dilated eye exam in the past 12 months? Yes   ? Have you had a dental exam in the past 12  months? No   upper dentures  ? Are you checking your feet? Yes   ? How many days per week are you checking your feet? 7   ?  ? Dietary Intake  ? Breakfast NABS and diet coke   ? Lunch 1 sandwich (chicken or [place] on rye, mustard), chips, occasional fruit   ? Snack (afternoon) chips   ? Dinner pizza or hamburger OR grilled chicken or [place], chips, vegetables   ? Snack (evening) none   ? Beverage(s) water, 2% milk (36 oz per day), coffee with sugar free vanilla creamer   ?  ? Exercise  ? Exercise Type ADL's   ?  ? Patient Education  ? Previous Diabetes Education Yes (please comment)   ? Disease state  Definition of diabetes, type 1  and 2, and the diagnosis of diabetes   ? Nutrition management  Role of diet in the treatment of diabetes and the relationship between the three main macronutrients and blood glucose level;Food label reading, portion sizes and measuring food.;Meal options for control of blood glucose level and chronic complications.;Information on hints to eating out and maintain blood glucose control.   ? Physical activity and exercise  Role of exercise on diabetes management, blood pressure control and cardiac health.   ? Medications Reviewed patients medication for diabetes, action, purpose, timing of dose and side effects.   ? Monitoring Identified appropriate SMBG and/or A1C goals.;Yearly dilated eye exam   ? Acute complications Taught treatment of hypoglycemia - the 15 rule.;Discussed and identified patients' treatment of hyperglycemia.   ? Chronic complications Relationship between chronic complications and blood glucose control;Dental care   ? Psychosocial adjustment Identified and addressed patients feelings and concerns about diabetes   ?  ? Individualized Goals (developed by patient)  ? Nutrition General guidelines for healthy choices and portions discussed   ? Physical Activity Exercise 5-7 days per week;30 minutes per day   ? Medications take my medication as prescribed   ? Monitoring  test my blood glucose as discussed   ? Reducing Risk increase portions of healthy fats;examine blood glucose patterns   ?  ? Post-Education Assessment  ? Patient understands the diabetes disease and treatment process. Demonstrates understanding / competency   ? Patient understands incorporating nutritional management into lifestyle. Needs Review   ? Patient undertands incorporating physical activity into lifestyle. Demonstrates understanding / competency   ? Patient understands using medications safely. Demonstrates understanding / competency   ? Patient understands monitoring blood glucose, interpreting and using results Demonstrates  understanding / competency   ? Patient understands prevention, detection, and treatment of acute complications. Demonstrates understanding / competency   ? Patient understands prevention, detection, and treatment of chronic complications. Demonstrates understanding / competency   ? Patient understands how to develop strategies to address psychosocial issues. Demonstrates understanding / competency   ? Patient understands how to develop strategies to promote health/change behavior. Needs Review   ?  ? Outcomes  ? Expected Outcomes Demonstrated interest in learning. Expect positive outcomes   ? Future DMSE PRN   ? Program Status Completed   ? ?  ?  ? ?  ? ? ?Individualized Plan for Diabetes Self-Management Training:  ? ?Learning Objective:  Patient will have a greater understanding of diabetes self-management. ?Patient education plan is to attend individual and/or group sessions per assessed needs and concerns. ?  ?Plan:  ? ?Patient Instructions  ?Aim to be active at least 30 minutes most days. ?Choose beverages without carbohydrates ?  Nutrition quality meals and snacks ?Increase your non-starchy vegetables ?Consider changing your milk to 1% ? ? ?Continuous Glucose Monitor option:  FreeStyle 2 or 3 ? ?Breakfast ideas: ?1-2 slices whole wheat toast, peanut butter, banana ?Low fat [place] frozen breakfast sandwich ?Boiled egg, 1-2 slices toast, fresh fruit ?Cheese toast, fresh fruit ? ?Snack ideas: ? Protein bar ([place] protein bar for example) ?Celery and peanut butter ?Raw vegetables ?Sunflower seeds of lightly/unsalted nuts (small handful), fresh fruit ?Whole grain crackers and chicken salad (made with low fat mayo) ?6.  Berries, handful of nuts ?7. 1/4 cup trail mix ?8.  Cheese and fruit  ?9.  [place] balanced break ?  ? ?Aim for 3-4 Carb Choices per meal (45-60 grams) +/- 1 either way  ?Aim for 0-1 Carbs per snack if hungry  ?Include protein in moderation with your meals and snacks ?Consider reading  food labels for Total Carbohydrate of foods ?Consider  increasing your activity level by walking for 30 minutes daily as tolerated ?Consider checking BG at alternate times per day  ?Consider taking medication  as dir

## 2021-07-09 NOTE — Patient Instructions (Addendum)
Aim to be active at least 30 minutes most days. ?Choose beverages without carbohydrates ?Nutrition quality meals and snacks ?Increase your non-starchy vegetables ?Consider changing your milk to 1% ? ? ?Continuous Glucose Monitor option:  FreeStyle 2 or 3 ? ?Breakfast ideas: ?1-2 slices whole wheat toast, peanut butter, banana ?Low fat [place] frozen breakfast sandwich ?Boiled egg, 1-2 slices toast, fresh fruit ?Cheese toast, fresh fruit ? ?Snack ideas: ? Protein bar ([place] protein bar for example) ?Celery and peanut butter ?Raw vegetables ?Sunflower seeds of lightly/unsalted nuts (small handful), fresh fruit ?Whole grain crackers and chicken salad (made with low fat mayo) ?6.  Berries, handful of nuts ?7. 1/4 cup trail mix ?8.  Cheese and fruit  ?9.  [place] balanced break ?  ? ?Aim for 3-4 Carb Choices per meal (45-60 grams) +/- 1 either way  ?Aim for 0-1 Carbs per snack if hungry  ?Include protein in moderation with your meals and snacks ?Consider reading food labels for Total Carbohydrate of foods ?Consider  increasing your activity level by walking for 30 minutes daily as tolerated ?Consider checking BG at alternate times per day  ?Consider taking medication  as directed by MD ? ? ?

## 2021-07-16 ENCOUNTER — Encounter: Payer: Self-pay | Admitting: Family Medicine

## 2021-07-16 ENCOUNTER — Ambulatory Visit: Payer: BC Managed Care – PPO | Admitting: Family Medicine

## 2021-07-16 VITALS — BP 136/68 | HR 78 | Temp 98.2°F | Resp 15 | Ht 72.0 in | Wt 222.2 lb

## 2021-07-16 DIAGNOSIS — E1165 Type 2 diabetes mellitus with hyperglycemia: Secondary | ICD-10-CM | POA: Diagnosis not present

## 2021-07-16 MED ORDER — OZEMPIC (0.25 OR 0.5 MG/DOSE) 2 MG/1.5ML ~~LOC~~ SOPN: 0.5000 mg | PEN_INJECTOR | SUBCUTANEOUS | 1 refills | Status: DC

## 2021-07-16 NOTE — Progress Notes (Signed)
? ?Subjective:  ?Patient ID: Robert Wise, male    DOB: 1961/01/30  Age: 61 y.o. MRN: [id number] ? ?CC:  ?Chief Complaint  ?Patient presents with  ? Diabetes  ?  Pt here for follow notes no concerns, lab work checked last visit   ? Health Maintenance  ?  Requested eye exam  ? ?HPI ?Robert Wise presents for  ? ?Diabetes: ?Uncontrolled with hyperglycemia.  Follow-up from his April 3 visit.  Had not been seen since August of last year.  As of last visit he was taking glipizide 10 mg twice daily, Xigduo 12/1998 mg daily. ?Had appointment with nutritionist 07/09/21. Has mad changes and lost 10 pounds in past few weeks. Walking more and eating breakfast, walking in afternoons.  ?Fasting 161 this am.  ?Postprandial in 200's.  Optho note requested. ?Microalbumin elevated at 3.7 on 07/02/2021.  He is on ARB with olmesartan hydrochlorothiazide combo.  ?No FH of MEN syndrome or medullary thyroid CA, no hx of pancreatitis.  ? ?Lab Results  ?Component Value Date  ? HGBA1C 8.3 (H) 07/02/2021  ? HGBA1C 9.4 (H) 11/27/2020  ? HGBA1C 8.6 (H) 11/08/2019  ? ?Lab Results  ?Component Value Date  ? MICROALBUR 3.7 (H) 07/02/2021  ? [place] 12/20/2019  ? CREATININE 0.90 07/02/2021  ? ? ? ?History ?Patient Active Problem List  ? Diagnosis Date Noted  ? Anxiety state 01/25/2018  ? Fatigue 01/25/2018  ? Uncontrolled type 2 diabetes mellitus with complication, without long-term current use of insulin 02/10/2015  ? Chronic pain 12/01/2014  ? Depression 10/12/2014  ? ?Past Medical History:  ?Diagnosis Date  ? Anxiety   ? Depression   ? Diabetes mellitus without complication ([place])   ? Hyperlipidemia   ? OSA (obstructive sleep apnea) 12/15/2012  ? Sleep apnea   ? ?Past Surgical History:  ?Procedure Laterality Date  ? ANKLE SURGERY Left 04/01/2004  ? CATARACT EXTRACTION Right 04/2021  ? VASECTOMY    ? ?Allergies  ?Allergen Reactions  ? Tricor [Fenofibrate]   ? Zocor [Simvastatin]   ? ?Prior to Admission medications   ?Medication Sig Start  Date End Date Taking? Authorizing Provider  ?aspirin (ASPIRIN CHILDRENS) 81 MG chewable tablet Chew 1 tablet (81 mg total) by mouth daily. 12/06/20  Yes Adrian Prows, MD  ?atorvastatin (LIPITOR) 80 MG tablet Take 1 tablet (80 mg total) by mouth daily. 07/02/21  Yes [name], MD  ?B Complex Vitamins (B-COMPLEX/B-12 PO) Take by mouth.   Yes [provider]  ?blood glucose meter kit and supplies KIT Dispense based on patient and insurance preference. Use up to four times daily as directed. (FOR ICD-9 250.00, 250.01). 01/07/19  Yes Forrest Moron, MD  ?Blood Glucose Monitoring Suppl (ONE TOUCH ULTRA SYSTEM KIT) W/DEVICE KIT 1 kit by Does not apply route once. 10/25/11  Yes Weber, Damaris Hippo, PA-C  ?busPIRone (BUSPAR) 30 MG tablet Take 1 tablet (30 mg total) by mouth 2 (two) times daily. 05/21/21  Yes Donnal Moat T, PA-C  ?cholecalciferol (VITAMIN D3) 25 MCG (1000 UT) tablet Take 2,000 Units by mouth daily.   Yes [provider]  ?Dapagliflozin-metFORMIN HCl ER (XIGDUO XR) 07-998 MG TB24 Take 2 tablets by mouth daily. 07/02/21  Yes [name], MD  ?esomeprazole (NEXIUM) 40 MG capsule Take 1 capsule (40 mg total) by mouth daily. 04/11/17  Yes Jaynee Eagles, PA-C  ?ferrous sulfate 325 (65 FE) MG tablet Take 65 mg by mouth daily with breakfast.   Yes [provider]  ?fish oil-omega-3 fatty acids 1000 MG capsule Take 2 g by mouth daily.   Yes [provider]  ?Ginkgo Biloba Extract 60 MG CAPS Take by mouth.   Yes [provider]  ?glipiZIDE (GLUCOTROL) 10 MG tablet Take 1 tablet (10 mg total) by mouth 2 (two) times daily before a meal. 07/02/21  Yes [name], MD  ?glucose blood (ONE TOUCH ULTRA TEST) test strip 1 each by Other route daily. 12/01/14  Yes Jaynee Eagles, PA-C  ?lamoTRIgine (LAMICTAL) 200 MG tablet Take 1 tablet (200 mg total) by mouth 2 (two) times daily. 05/21/21  Yes Addison Lank, PA-C  ?Lancets (ONETOUCH ULTRASOFT) lancets USE AS INSTRUCTED 11/28/13  Yes  [name], MD  ?levothyroxine (SYNTHROID) 175 MCG tablet Take 1 tablet (175 mcg total) by mouth daily before breakfast. 11/27/20  Yes [name], MD  ?olmesartan-hydrochlorothiazide (BENICAR HCT) 40-12.5 MG tablet Take 1 tablet by mouth every morning. 04/23/21  Yes Adrian Prows, MD  ?PARoxetine (PAXIL) 30 MG tablet Take 2 tablets (60 mg total) by mouth daily. 05/21/21  Yes Addison Lank, PA-C  ? ?Social History  ? ?Socioeconomic History  ? Marital status: Married  ?  Spouse name: [name]  ? Number of children: 3  ? Years of education: 11.5  ? Highest education level: Not on file  ?Occupational History  ?  Employer: [employer]  ?  Comment: Driver  ?Tobacco Use  ? Smoking status: Every Day  ?  Packs/day: 1.50  ?  Years: 37.00  ?  Pack years: 55.50  ?  Types: Cigarettes  ?  Start date: 04/02/2019  ? Smokeless tobacco: Never  ?Vaping Use  ? Vaping Use: Never used  ?Substance and Sexual Activity  ? Alcohol use: No  ?  Comment: quit: 08/14/2011  ? Drug use: No  ? Sexual activity: Yes  ?  Birth control/protection: None  ?Other Topics Concern  ? Not on file  ?Social History Narrative  ? Patient lives at home with family.   ? Caffeine Use: 1 pot of coffee daily  ? ?Social Determinants of Health  ? ?Financial Resource Strain: Not on file  ?Food Insecurity: Not on file  ?Transportation Needs: Not on file  ?Physical Activity: Not on file  ?Stress: Not on file  ?Social Connections: Not on file  ?Intimate Partner Violence: Not on file  ? ? ?Review of Systems ?Per HPI.  ? ?Objective:  ? ?Vitals:  ? 07/16/21 0947  ?BP: 136/68  ?Pulse: 78  ?Resp: 15  ?Temp: 98.2 ?F (36.8 ?C)  ?TempSrc: Temporal  ?SpO2: 98%  ?Weight: 222 lb 3.2 oz (100.8 kg)  ?Height: 6' (1.829 m)  ? ? ? ?Physical Exam ?Vitals reviewed.  ?Constitutional:   ?   Appearance: He is well-developed.  ?HENT:  ?   Head: Normocephalic and atraumatic.  ?Neck:  ?   Vascular: No carotid bruit or JVD.  ?Cardiovascular:  ?   Rate and Rhythm: Normal rate and regular  rhythm.  ?   Heart sounds: Normal heart sounds. No murmur heard. ?Pulmonary:  ?   Effort: Pulmonary effort is normal.  ?   Breath sounds: Normal breath sounds. No rales.  ?Musculoskeletal:  ?   Right lower leg: No edema.  ?   Left lower leg: No edema.  ?Skin: ?   General: Skin is warm and dry.  ?Neurological:  ?   Mental Status: He is alert and oriented to person, place, and time.  ?  Psychiatric:     ?   Mood and Affect: Mood normal.  ? ? ? ? ? ?Assessment & Plan:  ?PLEZ BELTON is a 61 y.o. male . ?Type 2 diabetes mellitus with hyperglycemia, without long-term current use of insulin (HCC) - Plan: Semaglutide,0.25 or 0.5MG/DOS, (OZEMPIC, 0.25 OR 0.5 MG/DOSE,) 2 MG/1.5ML SOPN ?Uncontrolled.  Add Ozempic.  Potential side effects and risks discussed.  Cautioned against hypoglycemia with use of glipizide and if that occurs should stop glipizide.  Anticipate we will be able to decrease glipizide dose as increasing doses of GLP-1.  Commended on diet/exercise changes as this will also improve diabetic control.  No other med changes.  Recheck 3 months. ? ?Meds ordered this encounter  ?Medications  ? Semaglutide,0.25 or 0.5MG/DOS, (OZEMPIC, 0.25 OR 0.5 MG/DOSE,) 2 MG/1.5ML SOPN  ?  Sig: Inject 0.5 mg into the skin once a week.  ?  Dispense:  6 mL  ?  Refill:  1  ? ?Patient Instructions  ?Start ozempic once per week. Watch blood sugars, and if any lows stop glipizide. Let me know if this occurs.  ?Recheck blood sugar in 3 months. Keep up the good work with diet/exercise! ? ?Semaglutide Injection ?What is this medication? ?SEMAGLUTIDE (SEM a GLOO tide) treats type 2 diabetes. It works by increasing insulin levels in your body, which decreases your blood sugar (glucose). It also reduces the amount of sugar released into the blood and slows down your digestion. It can also be used to lower the risk of heart attack and stroke in people with type 2 diabetes. Changes to diet and exercise are often combined with this  medication. ?This medicine may be used for other purposes; ask your health care provider or pharmacist if you have questions. ?COMMON BRAND NAME(S): OZEMPIC ?What should I tell my care team before I take this medication? ?

## 2021-07-16 NOTE — Patient Instructions (Addendum)
Start ozempic once per week. Watch blood sugars, and if any lows stop glipizide. Let me know if this occurs.  ?Recheck blood sugar in 3 months. Keep up the good work with diet/exercise! ? ?Semaglutide Injection ?What is this medication? ?SEMAGLUTIDE (SEM a GLOO tide) treats type 2 diabetes. It works by increasing insulin levels in your body, which decreases your blood sugar (glucose). It also reduces the amount of sugar released into the blood and slows down your digestion. It can also be used to lower the risk of heart attack and stroke in people with type 2 diabetes. Changes to diet and exercise are often combined with this medication. ?This medicine may be used for other purposes; ask your health care provider or pharmacist if you have questions. ?COMMON BRAND NAME(S): OZEMPIC ?What should I tell my care team before I take this medication? ?They need to know if you have any of these conditions: ?Endocrine tumors (MEN 2) or if someone in your family had these tumors ?Eye disease, vision problems ?History of pancreatitis ?Kidney disease ?Stomach problems ?Thyroid cancer or if someone in your family had thyroid cancer ?An unusual or allergic reaction to semaglutide, other medications, foods, dyes, or preservatives ?Pregnant or trying to get pregnant ?Breast-feeding ?How should I use this medication? ?This medication is for injection under the skin of your upper leg (thigh), stomach area, or upper arm. It is given once every week (every 7 days). You will be taught how to prepare and give this medication. Use exactly as directed. Take your medication at regular intervals. Do not take it more often than directed. ?If you use this medication with insulin, you should inject this medication and the insulin separately. Do not mix them together. Do not give the injections right next to each other. Change (rotate) injection sites with each injection. ?It is important that you put your used needles and syringes in a special  sharps container. Do not put them in a trash can. If you do not have a sharps container, call your pharmacist or care team to get one. ?A special MedGuide will be given to you by the pharmacist with each prescription and refill. Be sure to read this information carefully each time. ?This medication comes with INSTRUCTIONS FOR USE. Ask your pharmacist for directions on how to use this medication. Read the information carefully. Talk to your pharmacist or care team if you have questions. ?Talk to your care team about the use of this medication in children. Special care may be needed. ?Overdosage: If you think you have taken too much of this medicine contact a poison control center or emergency room at once. ?NOTE: This medicine is only for you. Do not share this medicine with others. ?What if I miss a dose? ?If you miss a dose, take it as soon as you can within 5 days after the missed dose. Then take your next dose at your regular weekly time. If it has been longer than 5 days after the missed dose, do not take the missed dose. Take the next dose at your regular time. Do not take double or extra doses. If you have questions about a missed dose, contact your care team for advice. ?What may interact with this medication? ?Other medications for diabetes ?Many medications may cause changes in blood sugar, these include: ?Alcohol containing beverages ?Antiviral medications for HIV or AIDS ?Aspirin and aspirin-like medications ?Certain medications for blood pressure, heart disease, irregular heart beat ?Chromium ?Diuretics ?Male hormones, such as estrogens or  progestins, birth control pills ?Fenofibrate ?Gemfibrozil ?Isoniazid ?Lanreotide ?Male hormones or anabolic steroids ?MAOIs like Carbex, Eldepryl, Marplan, Nardil, and Parnate ?Medications for weight loss ?Medications for allergies, asthma, cold, or cough ?Medications for depression, anxiety, or psychotic disturbances ?Niacin ?Nicotine ?NSAIDs, medications for pain  and inflammation, like ibuprofen or naproxen ?Octreotide ?Pasireotide ?Pentamidine ?Phenytoin ?Probenecid ?Quinolone antibiotics such as ciprofloxacin, levofloxacin, ofloxacin ?Some herbal dietary supplements ?Steroid medications such as prednisone or cortisone ?Sulfamethoxazole; trimethoprim ?Thyroid hormones ?Some medications can hide the warning symptoms of low blood sugar (hypoglycemia). You may need to monitor your blood sugar more closely if you are taking one of these medications. These include: ?Beta-blockers, often used for high blood pressure or heart problems (examples include atenolol, metoprolol, propranolol) ?Clonidine ?Guanethidine ?Reserpine ?This list may not describe all possible interactions. Give your health care provider a list of all the medicines, herbs, non-prescription drugs, or dietary supplements you use. Also tell them if you smoke, drink alcohol, or use illegal drugs. Some items may interact with your medicine. ?What should I watch for while using this medication? ?Visit your care team for regular checks on your progress. ?Drink plenty of fluids while taking this medication. Check with your care team if you get an attack of severe diarrhea, nausea, and vomiting. The loss of too much body fluid can make it dangerous for you to take this medication. ?A test called the HbA1C (A1C) will be monitored. This is a simple blood test. It measures your blood sugar control over the last 2 to 3 months. You will receive this test every 3 to 6 months. ?Learn how to check your blood sugar. Learn the symptoms of low and high blood sugar and how to manage them. ?Always carry a quick-source of sugar with you in case you have symptoms of low blood sugar. Examples include hard sugar candy or glucose tablets. Make sure others know that you can choke if you eat or drink when you develop serious symptoms of low blood sugar, such as seizures or unconsciousness. They must get medical help at once. ?Tell your care  team if you have high blood sugar. You might need to change the dose of your medication. If you are sick or exercising more than usual, you might need to change the dose of your medication. ?Do not skip meals. Ask your care team if you should avoid alcohol. Many nonprescription cough and cold products contain sugar or alcohol. These can affect blood sugar. ?Pens should never be shared. Even if the needle is changed, sharing may result in passing of viruses like hepatitis or HIV. ?Wear a medical ID bracelet or chain, and carry a card that describes your disease and details of your medication and dosage times. ?Do not become pregnant while taking this medication. Women should inform their care team if they wish to become pregnant or think they might be pregnant. There is a potential for serious side effects to an unborn child. Talk to your care team for more information. ?What side effects may I notice from receiving this medication? ?Side effects that you should report to your care team as soon as possible: ?Allergic reactions--skin rash, itching, hives, swelling of the face, lips, tongue, or throat ?Change in vision ?Dehydration--increased thirst, dry mouth, feeling faint or lightheaded, headache, dark yellow or brown urine ?Gallbladder problems--severe stomach pain, nausea, vomiting, fever ?Heart palpitations--rapid, pounding, or irregular heartbeat ?Kidney injury--decrease in the amount of urine, swelling of the ankles, hands, or feet ?Pancreatitis--severe stomach pain that spreads to your  back or gets worse after eating or when touched, fever, nausea, vomiting ?Thyroid cancer--new mass or lump in the neck, pain or trouble swallowing, trouble breathing, hoarseness ?Side effects that usually do not require medical attention (report to your care team if they continue or are bothersome): ?Diarrhea ?Loss of appetite ?Nausea ?Stomach pain ?Vomiting ?This list may not describe all possible side effects. Call your doctor  for medical advice about side effects. You may report side effects to FDA at 1-800-FDA-1088. ?Where should I keep my medication? ?Keep out of the reach of children. ?Store unopened pens in a refrigera

## 2021-07-17 ENCOUNTER — Ambulatory Visit: Payer: BC Managed Care – PPO

## 2021-07-17 DIAGNOSIS — I6523 Occlusion and stenosis of bilateral carotid arteries: Secondary | ICD-10-CM

## 2021-07-17 DIAGNOSIS — H40033 Anatomical narrow angle, bilateral: Secondary | ICD-10-CM | POA: Diagnosis not present

## 2021-07-17 DIAGNOSIS — E119 Type 2 diabetes mellitus without complications: Secondary | ICD-10-CM | POA: Diagnosis not present

## 2021-07-23 ENCOUNTER — Encounter: Payer: Self-pay | Admitting: Cardiology

## 2021-07-23 ENCOUNTER — Ambulatory Visit: Payer: BC Managed Care – PPO | Admitting: Cardiology

## 2021-07-23 VITALS — BP 138/70 | HR 67 | Temp 98.0°F | Resp 17 | Ht 72.0 in | Wt 227.8 lb

## 2021-07-23 DIAGNOSIS — E782 Mixed hyperlipidemia: Secondary | ICD-10-CM | POA: Diagnosis not present

## 2021-07-23 DIAGNOSIS — F17210 Nicotine dependence, cigarettes, uncomplicated: Secondary | ICD-10-CM | POA: Diagnosis not present

## 2021-07-23 DIAGNOSIS — F17200 Nicotine dependence, unspecified, uncomplicated: Secondary | ICD-10-CM

## 2021-07-23 DIAGNOSIS — I6523 Occlusion and stenosis of bilateral carotid arteries: Secondary | ICD-10-CM | POA: Diagnosis not present

## 2021-07-23 DIAGNOSIS — I10 Essential (primary) hypertension: Secondary | ICD-10-CM | POA: Diagnosis not present

## 2021-07-23 NOTE — Progress Notes (Signed)
? ?Primary Physician/Referring:  Robert Agreste, MD ? ?Patient ID: Robert Wise, male    DOB: Mar 07, 1961, 61 y.o.   MRN: [id number] ? ?Chief Complaint  ?Patient presents with  ? Follow-up  ? Results  ? ? ?HPI:   ? ?Robert Wise  is a 61 y.o. 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  He has 50-60+ year history of smoking cigarettes. ? ?He presents for follow-up of hypertension, carotid disease.  He has reduced his smoking.  States that he has also made significant lifestyle changes with regard to his diet.  He did attend diabetes education program as well.  He has not had any further chest pain.  Very mild exertional dyspnea persist but states that this is improving. ? ?Past Medical History:  ?Diagnosis Date  ? Anxiety   ? Depression   ? Diabetes mellitus without complication ([place])   ? Hyperlipidemia   ? OSA (obstructive sleep apnea) 12/15/2012  ? Sleep apnea   ? ?Past Surgical History:  ?Procedure Laterality Date  ? ANKLE SURGERY Left 04/01/2004  ? CATARACT EXTRACTION Right 04/2021  ? VASECTOMY    ? ?Family History  ?Problem Relation Age of Onset  ? Cancer Mother   ? Heart failure Sister   ? Sleep apnea Neg Hx   ?  ?Social History  ? ?Tobacco Use  ? Smoking status: Every Day  ?  Packs/day: 1.50  ?  Years: 37.00  ?  Pack years: 55.50  ?  Types: Cigarettes  ?  Start date: 04/02/2019  ? Smokeless tobacco: Never  ?Substance Use Topics  ? Alcohol use: No  ?  Comment: quit: 08/14/2011  ? ?Marital Status: Married  ?ROS  ?Review of Systems  ?Cardiovascular:  Positive for dyspnea on exertion. Negative for chest pain, leg swelling, orthopnea and palpitations.  ?Respiratory:  Positive for snoring.   ?Gastrointestinal:  Negative for melena.  ?Objective  ?Blood  pressure 138/70, pulse 67, temperature 98 ?F (36.7 ?C), temperature source Temporal, resp. rate 17, height 6' (1.829 m), weight 227 lb 12.8 oz (103.3 kg), SpO2 96 %. Body mass index is 30.9 kg/m?.  ? ?  07/23/2021  ?  8:39 AM 07/23/2021  ?  8:34 AM 07/16/2021  ?  9:47 AM  ?Vitals with BMI  ?Height  $Remove'6\' 0"'mWCOcKH$  $RemoveB'6\' 0"'afdUAtIE$   ?Weight  227 lbs 13 oz 222 lbs 3 oz  ?BMI  30.89 30.13  ?Systolic 440 102 725  ?Diastolic 70 66 68  ?Pulse 67 70 78  ?  ?Physical Exam ?Constitutional:   ?   Appearance: He is obese.  ?Neck:  ?   Vascular: Carotid bruit (bilateral carotid bruit and prominant left subclavian artery bruit) present. No JVD.  ?Cardiovascular:  ?   Rate and Rhythm: Normal rate and regular rhythm.  ?   Pulses: Normal pulses and intact distal pulses.     ?     Radial pulses are 2+ on the right side and 2+ on the left side.  ?   Heart sounds: Murmur heard.  ?Early systolic murmur is present with a grade of 2/6 at the upper right sternal border.  ?  No gallop.  ?Pulmonary:  ?   Effort: Pulmonary effort is normal.  ?   Breath sounds: Normal breath sounds.  ?  Abdominal:  ?   General: Bowel sounds are normal.  ?   Palpations: Abdomen is soft.  ?Musculoskeletal:     ?   General: No swelling.  ?  ?Laboratory examination:  ? ?Recent Labs  ?  11/27/20 ?1427 01/16/21 ?1430 07/02/21 ?1252  ?NA 136 138 137  ?K 4.3 4.6 4.6  ?CL 101 99 101  ?CO2 25 21 25   ?GLUCOSE 175* 131* 201*  ?BUN 11 18 17   ?CREATININE 0.87 1.08 0.90  ?CALCIUM 8.8 9.5 9.8  ? ?estimated creatinine clearance is 108.5 mL/min (by C-G formula based on SCr of 0.9 mg/dL).  ? ?  Latest Ref Rng & Units 07/02/2021  ? 12:52 PM 01/16/2021  ?  2:30 PM 11/27/2020  ?  2:27 PM  ?CMP  ?Glucose 70 - 99 mg/dL 201   131   175    ?BUN 6 - 23 mg/dL 17   18   11     ?Creatinine 0.40 - 1.50 mg/dL 0.90   1.08   0.87    ?Sodium 135 - 145 mEq/L 137   138   136    ?Potassium 3.5 - 5.1 mEq/L 4.6   4.6   4.3    ?Chloride 96 - 112 mEq/L 101   99   101    ?CO2 19 - 32 mEq/L 25   21   25     ?Calcium 8.4 - 10.5 mg/dL  9.8   9.5   8.8    ?Total Protein 6.0 - 8.3 g/dL 6.9    6.7    ?Total Bilirubin 0.2 - 1.2 mg/dL 0.5    0.4    ?Alkaline Phos 39 - 117 U/L 79    87    ?AST 0 - 37 U/L 18    13    ?ALT 0 - 53 U/L 30    14    ? ? ?  Latest Ref Rng & Units 06/02/2017  ?  4:59 PM 07/28/2015  ?  8:50 AM 02/09/2014  ?  6:34 PM  ?CBC  ?WBC 3.4 - 10.8 x10E3/uL 9.8   8.6   8.7    ?Hemoglobin 13.0 - 17.7 g/dL 14.7   15.5   14.5    ?Hematocrit 37.5 - 51.0 % 42.6   43.0   43.7    ?Platelets 150 - 379 x10E3/uL 253      ? ? ?Lipid Panel ?Recent Labs  ?  11/27/20 ?1427 07/02/21 ?1252  ?CHOL 144 141  ?TRIG 390.0* 219.0*  ?VLDL 78.0* 43.8*  ?HDL 24.70* 27.60*  ?CHOLHDL 6 5  ?LDLDIRECT 78.0 80.0  ? ? ?HEMOGLOBIN A1C ?Lab Results  ?Component Value Date  ? HGBA1C 8.3 (H) 07/02/2021  ? MPG 177 12/29/2015  ? ?TSH ?Recent Labs  ?  11/27/20 ?1427 07/02/21 ?1252  ?TSH 0.83 4.08  ? ?Medications and allergies  ? ?Allergies  ?Allergen Reactions  ? Tricor [Fenofibrate]   ? Zocor [Simvastatin]   ?  ?Medication list after today's encounter  ? ?Current Outpatient Medications:  ?  aspirin (ASPIRIN CHILDRENS) 81 MG chewable tablet, Chew 1 tablet (81 mg total) by mouth daily., Disp: , Rfl:  ?  atorvastatin (LIPITOR) 80 MG tablet, Take 1 tablet (80 mg total) by mouth daily., Disp: 90 tablet, Rfl: 2 ?  B Complex Vitamins (B-COMPLEX/B-12 PO), Take by mouth., Disp: , Rfl:  ?  blood glucose meter kit and supplies KIT, Dispense based on patient and insurance preference. Use up to four times daily as directed. (  FOR ICD-9 250.00, 250.01)., Disp: 1 each, Rfl: 0 ?  Blood Glucose Monitoring Suppl (ONE TOUCH ULTRA SYSTEM KIT) W/DEVICE KIT, 1 kit by Does not apply route once., Disp: 1 each, Rfl: 0 ?  busPIRone (BUSPAR) 30 MG tablet, Take 1 tablet (30 mg total) by mouth 2 (two) times daily., Disp: 180 tablet, Rfl: 1 ?  cholecalciferol (VITAMIN D3) 25 MCG (1000 UT) tablet, Take 2,000 Units by mouth daily., Disp: , Rfl:  ?  Dapagliflozin-metFORMIN HCl ER (XIGDUO XR) 07-998 MG TB24, Take  2 tablets by mouth daily., Disp: 180 tablet, Rfl: 1 ?  esomeprazole (NEXIUM) 40 MG capsule, Take 1 capsule (40 mg total) by mouth daily., Disp: 90 capsule, Rfl: 3 ?  ferrous sulfate 325 (65 FE) MG tablet, Take 65 mg by mouth daily with breakfast., Disp: , Rfl:  ?  fish oil-omega-3 fatty acids 1000 MG capsule, Take 2 g by mouth daily., Disp: , Rfl:  ?  Ginkgo Biloba Extract 60 MG CAPS, Take by mouth., Disp: , Rfl:  ?  glipiZIDE (GLUCOTROL) 10 MG tablet, Take 1 tablet (10 mg total) by mouth 2 (two) times daily before a meal., Disp: 180 tablet, Rfl: 1 ?  glucose blood (ONE TOUCH ULTRA TEST) test strip, 1 each by Other route daily., Disp: 100 each, Rfl: 8 ?  lamoTRIgine (LAMICTAL) 200 MG tablet, Take 1 tablet (200 mg total) by mouth 2 (two) times daily., Disp: 180 tablet, Rfl: 1 ?  Lancets (ONETOUCH ULTRASOFT) lancets, USE AS INSTRUCTED, Disp: 100 each, Rfl: 0 ?  levothyroxine (SYNTHROID) 175 MCG tablet, Take 1 tablet (175 mcg total) by mouth daily before breakfast., Disp: 90 tablet, Rfl: 3 ?  olmesartan-hydrochlorothiazide (BENICAR HCT) 40-12.5 MG tablet, Take 1 tablet by mouth every morning., Disp: 100 tablet, Rfl: 3 ?  PARoxetine (PAXIL) 30 MG tablet, Take 2 tablets (60 mg total) by mouth daily., Disp: 180 tablet, Rfl: 1 ?  Semaglutide,0.25 or 0.5MG/DOS, (OZEMPIC, 0.25 OR 0.5 MG/DOSE,) 2 MG/1.5ML SOPN, Inject 0.5 mg into the skin once a week., Disp: 6 mL, Rfl: 1  ? ? ?Radiology:  ? ?CXR 2 view 05/29/2021: ?Subtle increased markings in the medial right lower lung fields suggest subsegmental atelectasis/pneumonia. There is no pleural effusion or pneumothorax. ? ?Cardiac Studies:  ? ?[facility] MYOCARDIAL PERFUSION WO LEXISCAN 12/19/2020  ?1 Day Rest/Stress Protocol. ?Stress EKG is non-diagnostic, as this is pharmacological stress test. ?Small size, mild intensity, fixed perfusion defect most likely secondary to soft tissue attenuation (images obtained while sitting upright and BMI >30).  Without convincing evidence of  reversible myocardial ischemia or prior infarct. ?LVEF 56%, LV size mildly dilated, wall thickness is preserved without regional wall motion abnormalities. ?No prior studies available for comparison. ?Low r

## 2021-08-01 ENCOUNTER — Encounter: Payer: Self-pay | Admitting: Family Medicine

## 2021-09-13 ENCOUNTER — Other Ambulatory Visit: Payer: Self-pay | Admitting: Physician Assistant

## 2021-10-01 ENCOUNTER — Ambulatory Visit: Payer: BC Managed Care – PPO | Admitting: Physician Assistant

## 2021-10-18 ENCOUNTER — Ambulatory Visit: Payer: BC Managed Care – PPO | Admitting: Family Medicine

## 2021-10-22 ENCOUNTER — Ambulatory Visit: Payer: BC Managed Care – PPO | Admitting: Cardiology

## 2021-10-22 ENCOUNTER — Ambulatory Visit (INDEPENDENT_AMBULATORY_CARE_PROVIDER_SITE_OTHER): Payer: BC Managed Care – PPO | Admitting: Physician Assistant

## 2021-10-22 ENCOUNTER — Encounter: Payer: Self-pay | Admitting: Physician Assistant

## 2021-10-22 DIAGNOSIS — G2581 Restless legs syndrome: Secondary | ICD-10-CM

## 2021-10-22 DIAGNOSIS — G4733 Obstructive sleep apnea (adult) (pediatric): Secondary | ICD-10-CM

## 2021-10-22 DIAGNOSIS — F411 Generalized anxiety disorder: Secondary | ICD-10-CM

## 2021-10-22 DIAGNOSIS — F1510 Other stimulant abuse, uncomplicated: Secondary | ICD-10-CM

## 2021-10-22 DIAGNOSIS — F3341 Major depressive disorder, recurrent, in partial remission: Secondary | ICD-10-CM

## 2021-10-22 MED ORDER — GABAPENTIN 600 MG PO TABS: 600.0000 mg | ORAL_TABLET | Freq: Every day | ORAL | 1 refills | Status: DC

## 2021-10-22 NOTE — Progress Notes (Signed)
[facility]  Patient ID: Robert Wise,  MRN: [id number]  PCP: [name], MD  Date of Evaluation: 10/22/2021 Time spent:30 minutes  Chief Complaint:  Chief Complaint   Insomnia; Anxiety; Follow-up    HISTORY/CURRENT STATUS: HPI For routine med check.  Using the CPAP, still not sleeping well.  His legs jerk off and on all night.  Wife sleeps a different room now. He goes to sleep just fine but then wakes up in an hour or so to go to the bathroom.  He ends up going outside to smoke a cigarette and then goes back to bed.  This happens at least 3 times a night and usually more.  He admits to drinking iced coffee with 2 or 3 shots of espresso all throughout the day.  He also drinks diet soft drinks, but caffeinated.  Does not feel rested when he gets up.  Probably gets about 4 to 5 hours of sleep per night.  Maximum 6 hours. Never started gabapentin.  For the past couple of weeks, he has been more anxious.  Denies increasing caffeine during this timeframe.  Not having panic attacks, but his wife states he jerks his leg, is fidgety a lot.  He does not really notice it but he feels uneasy like he cannot sit still.  States he is impatient if he has to stand in line even just for 1 or 2 people.  That's not usually like him.  Still smokes cigarettes.  "I know I need to quit but I do not want to take any more medicine to stop."  Patient denies loss of interest in usual activities and is able to enjoy things.  Denies decreased energy or motivation.  Appetite has not changed.  No extreme sadness, tearfulness, or feelings of hopelessness.  Denies any changes in concentration, making decisions or remembering things.  Denies suicidal or homicidal thoughts.  No increased energy with decreased need for sleep, no increased talkativeness, no racing thoughts, no impulsivity or risky behaviors, no increased spending, no increased libido, no grandiosity, no paranoia, and no  hallucinations.  Denies dizziness, syncope, seizures, numbness, tingling, tremor, tics, unsteady gait, slurred speech, confusion. Denies muscle or joint pain, stiffness, or dystonia.  His PCP takes care of cholesterol and glucose.  Individual Medical History/ Review of Systems: Changes? :No   he sees endocrinology for diabetes.  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 Use up to four times daily as directed. (FOR ICD-9 250.00, 250.01)., Disp: 1 each, Rfl: 0   Blood Glucose Monitoring Suppl (ONE TOUCH ULTRA SYSTEM KIT) W/DEVICE KIT, 1 kit by Does not apply route once., Disp: 1 each, Rfl: 0   busPIRone (BUSPAR) 30 MG tablet, Take 1  tablet (30 mg total) by mouth 2 (two) times daily., Disp: 180 tablet, Rfl: 1   cholecalciferol (VITAMIN D3) 25 MCG (1000 UT) tablet, Take 2,000 Units by mouth daily., Disp: , Rfl:    Dapagliflozin-metFORMIN HCl ER (XIGDUO XR) 07-998 MG TB24, Take 2 tablets by mouth daily., Disp: 180 tablet, Rfl: 1   esomeprazole (NEXIUM) 40 MG capsule, Take 1 capsule (40 mg total) by mouth daily., Disp: 90 capsule, Rfl: 3   ferrous sulfate 325 (65 FE) MG tablet, Take 65 mg by mouth daily with  breakfast., Disp: , Rfl:    fish oil-omega-3 fatty acids 1000 MG capsule, Take 2 g by mouth daily., Disp: , Rfl:    Ginkgo Biloba Extract 60 MG CAPS, Take by mouth., Disp: , Rfl:    glucose blood (ONE TOUCH ULTRA TEST) test strip, 1 each by Other route daily., Disp: 100 each, Rfl: 8   lamoTRIgine (LAMICTAL) 200 MG tablet, Take 1 tablet (200 mg total) by mouth 2 (two) times daily., Disp: 180 tablet, Rfl: 1   Lancets (ONETOUCH ULTRASOFT) lancets, USE AS INSTRUCTED, Disp: 100 each, Rfl: 0   levothyroxine (SYNTHROID) 175 MCG tablet, Take 1 tablet (175 mcg total) by mouth daily before breakfast., Disp: 90 tablet, Rfl: 3   olmesartan-hydrochlorothiazide (BENICAR HCT) 40-12.5 MG tablet, Take 1 tablet by mouth every morning., Disp: 100 tablet, Rfl: 3   PARoxetine (PAXIL) 30 MG tablet, Take 2 tablets (60 mg total) by mouth daily., Disp: 180 tablet, Rfl: 1   Semaglutide,0.25 or 0.5MG/DOS, (OZEMPIC, 0.25 OR 0.5 MG/DOSE,) 2 MG/1.5ML SOPN, Inject 0.5 mg into the skin once a week., Disp: 6 mL, Rfl: 1   gabapentin (NEURONTIN) 600 MG tablet, Take 1 tablet (600 mg total) by mouth at bedtime. Take around 3-4 hours before bedtime., Disp: 30 tablet, Rfl: 1   glipiZIDE (GLUCOTROL) 10 MG tablet, Take 1 tablet (10 mg total) by mouth 2 (two) times daily before a meal. (Patient not taking: Reported on 10/22/2021), Disp: 180 tablet, Rfl: 1 Medication Side Effects: none  Family Medical/ Social History: Changes? No  MENTAL HEALTH EXAM:  There were no vitals taken for this visit.There is no height or weight on file to calculate BMI.  General Appearance: Casual, Neat and Well Groomed  Eye Contact:  Good  Speech:  Clear and Coherent and Normal Rate  Volume:  Normal  Mood:  Euthymic  Affect:  Appropriate  Thought Process:  Goal Directed and Descriptions of Associations: Circumstantial  Orientation:  Full (Time, Place, and Person)  Thought Content: Logical   Suicidal Thoughts:  No  Homicidal Thoughts:  No  Memory:    Short-term memory has decreased but remote is normal, like trouble remembering what he went into a room for.  Judgement:  Good  Insight:  Good  Psychomotor Activity:  Normal  Concentration:  Concentration: Good and Attention Span: Fair  Recall:  Good  Fund of Knowledge: Good  Language: Good  Assets:  Desire for Improvement Financial Resources/Insurance Housing Transportation Vocational/Educational  ADL's:  Intact  Cognition: WNL  Prognosis:  Good    DIAGNOSES:    ICD-10-CM   1. Restless leg syndrome  G25.81     2. Obstructive sleep apnea  G47.33     3. Generalized anxiety disorder  F41.1     4. Recurrent major depressive disorder, in partial remission ([place])  F33.41     5. 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  Last control substance was tramadol 01/27/2021. I provided 30 minutes of face to face time during this encounter, including time spent before and after the visit in records review, medical decision making, counseling pertinent to today's visit, and charting.   Long disc about caffeine abuse. Causes anxiety, insomnia and makes RLS worse. Cut back by 1 caffeinated beverage daily for 1 wk, then dec by another serving per day per wk, etc until no more than 2 per day and not after 2 PM. Even that may be too much for him, we'll see.   Take the gabapentin! Expalined purpose, for RLS and anx. Also should help sleep. Has been nervous to try it when Rx months ago. He says he will now.   Continue BuSpar 30 mg, 1 p.o. twice daily.  Start Gabapentin 600 mg, 3-4 hours before bed.  Continue Lamictal 200 mg, 1 p.o. twice daily. Continue Paxil 30 mg, 2 p.o. daily. Continue multivitamin, ginkgo biloba, B complex, fish oil, vitamin D. Continue using CPAP. Return in 6 wks.   Donnal Moat, PA-C

## 2021-10-29 ENCOUNTER — Ambulatory Visit: Payer: BC Managed Care – PPO | Admitting: Cardiology

## 2021-10-29 ENCOUNTER — Encounter: Payer: Self-pay | Admitting: Cardiology

## 2021-10-29 VITALS — BP 135/71 | HR 62 | Temp 97.6°F | Resp 17 | Ht 72.0 in | Wt 214.0 lb

## 2021-10-29 DIAGNOSIS — E782 Mixed hyperlipidemia: Secondary | ICD-10-CM

## 2021-10-29 DIAGNOSIS — I6523 Occlusion and stenosis of bilateral carotid arteries: Secondary | ICD-10-CM

## 2021-10-29 DIAGNOSIS — I10 Essential (primary) hypertension: Secondary | ICD-10-CM

## 2021-10-29 NOTE — Progress Notes (Signed)
Primary Physician/Referring:  [name], MD  Patient ID: Robert Wise, male    DOB: Aug 24, 1960, 61 y.o.   MRN: [id number]  Chief Complaint  Patient presents with   Hypertension   Hyperlipidemia   Follow-up    3 month    HPI:    Robert Wise  is a 61 y.o. Caucasian white male with uncontrolled diabetes mellitus with peripheral neuropathy, hyperlipidemia, primary hypertension, OSA on CPAP and compliant, obesity, tobacco use disorder and who is a truck driver by profession, 50-60+ year history of smoking cigarettes, asymptomatic left subclavian artery stenosis, asymptomatic bilateral carotid artery stenosis.  He presents for follow-up of hypertension, hyperlipidemia and weight loss.  Presently he is taking to his diet and has been losing weight.  He is changed from regular sodas to diet sodas.  Denies chest pain or dyspnea, dyspnea has remained stable.  No PND or orthopnea.  Past Medical History:  Diagnosis Date   Anxiety    Depression    Diabetes mellitus without complication (HCC)    Hyperlipidemia    OSA (obstructive sleep apnea) 12/15/2012   Sleep apnea    Past Surgical History:  Procedure Laterality Date   ANKLE SURGERY Left 04/01/2004   CATARACT EXTRACTION Right 04/2021   VASECTOMY     Family History  Problem Relation Age of Onset   Cancer Mother    Heart failure Sister    Sleep apnea Neg Hx     Social History   Tobacco Use   Smoking status: Every Day    Packs/day: 1.50    Years: 37.00    Total pack years: 55.50    Types: Cigarettes    Start date: 04/02/2019   Smokeless tobacco: Never  Substance Use Topics   Alcohol use: No    Comment: quit: 08/14/2011   Marital Status: Married  ROS  Review of Systems  Cardiovascular:  Positive for dyspnea on exertion. Negative for chest pain and leg swelling.  Respiratory:  Positive for snoring.   Gastrointestinal:  Negative for melena.   Objective  Blood pressure 135/71, pulse 62, temperature 97.6 F  (36.4 C), resp. rate 17, height 6' (1.829 m), weight 214 lb (97.1 kg), SpO2 96 %. Body mass index is 29.02 kg/m.     10/29/2021    8:37 AM 07/23/2021    8:39 AM 07/23/2021    8:34 AM  Vitals with BMI  Height 6' 0"   6' 0"   Weight 214 lbs  227 lbs 13 oz  BMI 65.68  12.75  Systolic 170 017 494  Diastolic 71 70 66  Pulse 62 67 70    Physical Exam Constitutional:      Appearance: He is obese.  Neck:     Vascular: Carotid bruit (bilateral carotid bruit and prominant left subclavian artery bruit) present. No JVD.  Cardiovascular:     Rate and Rhythm: Normal rate and regular rhythm.     Pulses: Normal pulses and intact distal pulses.          Radial pulses are 2+ on the right side and 2+ on the left side.     Heart sounds: Murmur heard.     Early systolic murmur is present with a grade of 2/6 at the upper right sternal border.     No gallop.  Pulmonary:     Effort: Pulmonary effort is normal.     Breath sounds: Normal breath sounds.  Abdominal:     General: Bowel sounds are normal.  Palpations: Abdomen is soft.  Musculoskeletal:        General: No swelling.     Laboratory examination:   Lab Results  Component Value Date   NA 137 07/02/2021   K 4.6 07/02/2021   CO2 25 07/02/2021   GLUCOSE 201 (H) 07/02/2021   BUN 17 07/02/2021   CREATININE 0.90 07/02/2021   CALCIUM 9.8 07/02/2021   EGFR 79 01/16/2021   GFRNONAA 98 04/26/2019    Recent Labs    11/27/20 1427 01/16/21 1430 07/02/21 1252  NA 136 138 137  K 4.3 4.6 4.6  CL 101 99 101  CO2 25 21 25   GLUCOSE 175* 131* 201*  BUN 11 18 17   CREATININE 0.87 1.08 0.90  CALCIUM 8.8 9.5 9.8   CrCl cannot be calculated (Patient's most recent lab result is older than the maximum 21 days allowed.).     Latest Ref Rng & Units 07/02/2021   12:52 PM 01/16/2021    2:30 PM 11/27/2020    2:27 PM  CMP  Glucose 70 - 99 mg/dL 201  131  175   BUN 6 - 23 mg/dL 17  18  11    Creatinine 0.40 - 1.50 mg/dL 0.90  1.08  0.87   Sodium  135 - 145 mEq/L 137  138  136   Potassium 3.5 - 5.1 mEq/L 4.6  4.6  4.3   Chloride 96 - 112 mEq/L 101  99  101   CO2 19 - 32 mEq/L 25  21  25    Calcium 8.4 - 10.5 mg/dL 9.8  9.5  8.8   Total Protein 6.0 - 8.3 g/dL 6.9   6.7   Total Bilirubin 0.2 - 1.2 mg/dL 0.5   0.4   Alkaline Phos 39 - 117 U/L 79   87   AST 0 - 37 U/L 18   13   ALT 0 - 53 U/L 30   14       Latest Ref Rng & Units 06/02/2017    4:59 PM 07/28/2015    8:50 AM 02/09/2014    6:34 PM  CBC  WBC 3.4 - 10.8 x10E3/uL 9.8  8.6  8.7   Hemoglobin 13.0 - 17.7 g/dL 14.7  15.5  14.5   Hematocrit 37.5 - 51.0 % 42.6  43.0  43.7   Platelets 150 - 379 x10E3/uL 253       Lipid Panel Recent Labs    11/27/20 1427 07/02/21 1252  CHOL 144 141  TRIG 390.0* 219.0*  VLDL 78.0* 43.8*  HDL 24.70* 27.60*  CHOLHDL 6 5  LDLDIRECT 78.0 80.0    HEMOGLOBIN A1C Lab Results  Component Value Date   HGBA1C 8.3 (H) 07/02/2021   MPG 177 12/29/2015   TSH Recent Labs    11/27/20 1427 07/02/21 1252  TSH 0.83 4.08   Medications and allergies   Allergies  Allergen Reactions   Tricor [Fenofibrate]    Zocor [Simvastatin]     Medication list after today's encounter   Current Outpatient Medications:    aspirin (ASPIRIN CHILDRENS) 81 MG chewable tablet, Chew 1 tablet (81 mg total) by mouth daily., Disp: , Rfl:    atorvastatin (LIPITOR) 80 MG tablet, Take 1 tablet (80 mg total) by mouth daily., Disp: 90 tablet, Rfl: 2   B Complex Vitamins (B-COMPLEX/B-12 PO), Take by mouth., Disp: , Rfl:    blood glucose meter kit and supplies KIT, Dispense based on patient and insurance preference. Use up to four times daily as directed. (  FOR ICD-9 250.00, 250.01)., Disp: 1 each, Rfl: 0   Blood Glucose Monitoring Suppl (ONE TOUCH ULTRA SYSTEM KIT) W/DEVICE KIT, 1 kit by Does not apply route once., Disp: 1 each, Rfl: 0   busPIRone (BUSPAR) 30 MG tablet, Take 1 tablet (30 mg total) by mouth 2 (two) times daily., Disp: 180 tablet, Rfl: 1   cholecalciferol  (VITAMIN D3) 25 MCG (1000 UT) tablet, Take 2,000 Units by mouth daily., Disp: , Rfl:    Dapagliflozin-metFORMIN HCl ER (XIGDUO XR) 07-998 MG TB24, Take 2 tablets by mouth daily., Disp: 180 tablet, Rfl: 1   esomeprazole (NEXIUM) 40 MG capsule, Take 1 capsule (40 mg total) by mouth daily., Disp: 90 capsule, Rfl: 3   ferrous sulfate 325 (65 FE) MG tablet, Take 65 mg by mouth daily with breakfast., Disp: , Rfl:    fish oil-omega-3 fatty acids 1000 MG capsule, Take 2 g by mouth daily., Disp: , Rfl:    gabapentin (NEURONTIN) 600 MG tablet, Take 1 tablet (600 mg total) by mouth at bedtime. Take around 3-4 hours before bedtime., Disp: 30 tablet, Rfl: 1   Ginkgo Biloba Extract 60 MG CAPS, Take by mouth., Disp: , Rfl:    glipiZIDE (GLUCOTROL) 10 MG tablet, Take 1 tablet (10 mg total) by mouth 2 (two) times daily before a meal., Disp: 180 tablet, Rfl: 1   glucose blood (ONE TOUCH ULTRA TEST) test strip, 1 each by Other route daily., Disp: 100 each, Rfl: 8   lamoTRIgine (LAMICTAL) 200 MG tablet, Take 1 tablet (200 mg total) by mouth 2 (two) times daily., Disp: 180 tablet, Rfl: 1   Lancets (ONETOUCH ULTRASOFT) lancets, USE AS INSTRUCTED, Disp: 100 each, Rfl: 0   levothyroxine (SYNTHROID) 175 MCG tablet, Take 1 tablet (175 mcg total) by mouth daily before breakfast., Disp: 90 tablet, Rfl: 3   olmesartan-hydrochlorothiazide (BENICAR HCT) 40-12.5 MG tablet, Take 1 tablet by mouth every morning., Disp: 100 tablet, Rfl: 3   PARoxetine (PAXIL) 30 MG tablet, Take 2 tablets (60 mg total) by mouth daily., Disp: 180 tablet, Rfl: 1    Radiology:   CXR 2 view 05/29/2021: Subtle increased markings in the medial right lower lung fields suggest subsegmental atelectasis/pneumonia. There is no pleural effusion or pneumothorax.  Cardiac Studies:   [facility] MYOCARDIAL PERFUSION WO LEXISCAN 12/19/2020  1 Day Rest/Stress Protocol. Stress EKG is non-diagnostic, as this is pharmacological stress test. Small size, mild intensity,  fixed perfusion defect most likely secondary to soft tissue attenuation (images obtained while sitting upright and BMI >30).  Without convincing evidence of reversible myocardial ischemia or prior infarct. LVEF 56%, LV size mildly dilated, wall thickness is preserved without regional wall motion abnormalities. No prior studies available for comparison. Low risk study.   [facility] ECHOCARDIOGRAM COMPLETE 12/27/2020  Narrative Echocardiogram 12/27/2020: Left ventricle cavity is normal in size. Moderate concentric hypertrophy of the left ventricle. Normal global wall motion. Normal LV systolic function with EF 68%. Doppler evidence of grade I (impaired) diastolic dysfunction, normal LAP. Left atrial cavity is borderline dilated. Trileaflet aortic valve.  Mild (Grade I) aortic regurgitation. Mild (Grade I) mitral regurgitation. Mild tricuspid regurgitation. No evidence of pulmonary hypertension.  Zio Patch Extended out patient EKG monitoring 7 days starting 12/27/2020: Predominant rhythm is normal sinus rhythm.  There were 3 brief atrial tachycardia episodes.  There were occasional PACs, rare PVCs. There were no diary entries however triggered events correlated with PACs and PVCs.  There was no atrial fibrillation, there was no heart block, no  complex ventricular arrhythmias.    Carotid artery duplex 07/17/2021:  Duplex suggests stenosis in the right internal carotid artery (>=70%). The right PSV internal/common carotid artery ratio of 5.42 is consistent with a stenosis of >70%. Peak velocity in the proximal ICA.  130/115 cm/s. Duplex suggests stenosis in the right common carotid artery (<50%).  Duplex suggests stenosis in the right external carotid artery (>50%). Duplex suggests stenosis in the left internal carotid artery (50-69%). Duplex suggests stenosis in the left common carotid artery (<50%). Duplex suggests stenosis in the left external carotid artery (<50%). The left vertebral artery waveform  demonstrates an antegrade, high-resistant flow pattern, may suggest distal disease. Antegrade right vertebral artery flow. Antegrade left vertebral artery flow. Compared to the study done on 12/27/2020, there is progression of disease in the right ACA.  Left vertebral artery flow now revealing high-grade stent decreased flow suggestive of distal disease. Follow up in six months is appropriate if clinically indicated.  EKG:   EKG 07/23/2021: Normal sinus rhythm at rate of 62 bpm, borderline left atrial enlargement.  Otherwise normal EKG.  Assessment     ICD-10-CM   1. Primary hypertension  I10     2. Mixed hyperlipidemia  E78.2     3. Asymptomatic bilateral carotid artery stenosis  I65.23        Medications Discontinued During This Encounter  Medication Reason   Semaglutide,0.25 or 0.5MG/DOS, (OZEMPIC, 0.25 OR 0.5 MG/DOSE,) 2 MG/1.5ML SOPN       No orders of the defined types were placed in this encounter.  No orders of the defined types were placed in this encounter.  Recommendations:   Robert Wise is a 61 y.o. Caucasian white male with uncontrolled diabetes mellitus with peripheral neuropathy, hyperlipidemia, primary hypertension, OSA on CPAP and compliant, obesity, tobacco use disorder and who is a truck driver by profession, 50-60+ year history of smoking cigarettes asymptomatic left subclavian artery stenosis and bilateral asymptomatic carotid artery stenosis presents here for a 35-monthoffice visit.  He has been losing weight, has lost about 15 pounds in weight since last office visit 3 months ago.  He was supposed to have obtained lipid profile testing but he will do this next week when he goes to see Dr. JMerri Rayin his office.  Blood pressure is well controlled, he is on appropriate medical therapy.  I have again reviewed with him regarding peripheral arterial disease and risk of myocardial infarction and stroke especially with regard to smoking.  He is trying his  best he has made dietary changes now and is given up on regular sodas and now has switched to diet sodas which he plans to even reduce that as well.  I suspect his triglyceride presently improved.  He has discontinued glipizide due to low blood sugar, and he is wondering whether he should go back on it.  He will review that with his PCP.  His goal LDL is <70 if not 55.  His triglycerides need to be controlled, since he has made diet changes and is losing weight I suspect this will also improve.  He has been scheduled for carotid artery duplex in 3 months I will see him back after that.    JAdrian Prows MD, F[facility]7/31/2023, 9:00 AM Office: [phone]

## 2021-11-05 ENCOUNTER — Ambulatory Visit: Payer: BC Managed Care – PPO | Admitting: Family Medicine

## 2021-11-05 ENCOUNTER — Encounter: Payer: Self-pay | Admitting: Family Medicine

## 2021-11-05 VITALS — BP 138/64 | HR 64 | Temp 98.0°F | Resp 20 | Ht 73.0 in | Wt 217.4 lb

## 2021-11-05 DIAGNOSIS — E1165 Type 2 diabetes mellitus with hyperglycemia: Secondary | ICD-10-CM

## 2021-11-05 LAB — GLUCOSE, POCT (MANUAL RESULT ENTRY): POC Glucose: 147 mg/dl — AB (ref 70–99)

## 2021-11-05 LAB — POCT GLYCOSYLATED HEMOGLOBIN (HGB A1C): Hemoglobin A1C: 7.1 % — AB (ref 4.0–5.6)

## 2021-11-05 MED ORDER — XIGDUO XR 5-1000 MG PO TB24: 2.0000 | ORAL_TABLET | Freq: Every day | ORAL | 1 refills | Status: DC

## 2021-11-05 NOTE — Patient Instructions (Addendum)
Your A1c is better. Great work on [employer] changes! Stay off the glipizide for now, but take the xigduo 2 pills per day consistently and recheck in 3 months. If low or high readings, let me know and we can make some other changes. Take care.

## 2021-11-05 NOTE — Progress Notes (Signed)
Subjective:  Patient ID: Robert Wise, male    DOB: 11/12/1960  Age: 61 y.o. MRN: [id number]  CC:  Chief Complaint  Patient presents with   Diabetes    Stopped glipizide, wants to check A1c   Hypertension    HPI [name] presents for   Diabetes: History of uncontrolled diabetes with hyperglycemia.  Last seen in April.  Was taking glipizide, Xigduo at that time.  Started on Ozempic. Cut out sugar drinks, noticed lower readings in 70's - stopped glipizide past few months.  Did not get ozempic - not approved.  Currently taking xigduo 07/998 - only taking 2 per day if sugars are high that morning (if over 200), 1 if lower. In past few weeks took 1 per day on 3-4 days.  No low readings off glipizide.  Home readings up to 200's.  Trying to eat earlier in the evening.   Not fasting today.   Lab Results  Component Value Date   HGBA1C 7.1 (A) 11/05/2021   HGBA1C 8.3 (H) 07/02/2021   HGBA1C 9.4 (H) 11/27/2020   Lab Results  Component Value Date   MICROALBUR 3.7 (H) 07/02/2021   LDLCALC 77 12/20/2019   CREATININE 0.90 07/02/2021    History Patient Active Problem List   Diagnosis Date Noted   Anxiety state 01/25/2018   Fatigue 01/25/2018   Uncontrolled type 2 diabetes mellitus with complication, without long-term current use of insulin 02/10/2015   Chronic pain 12/01/2014   Depression 10/12/2014   Past Medical History:  Diagnosis Date   Anxiety    Depression    Diabetes mellitus without complication ([place])    Hyperlipidemia    OSA (obstructive sleep apnea) 12/15/2012   Sleep apnea    Past Surgical History:  Procedure Laterality Date   ANKLE SURGERY Left 04/01/2004   CATARACT EXTRACTION Right 04/2021   VASECTOMY     Allergies  Allergen Reactions   Tricor [Fenofibrate]    Zocor [Simvastatin]    Prior to Admission medications   Medication Sig Start Date End Date Taking? Authorizing Provider  aspirin (ASPIRIN CHILDRENS) 81 MG chewable tablet Chew 1  tablet (81 mg total) by mouth daily. 12/06/20  Yes Adrian Prows, MD  atorvastatin (LIPITOR) 80 MG tablet Take 1 tablet (80 mg total) by mouth daily. 07/02/21  Yes [name], MD  B Complex Vitamins (B-COMPLEX/B-12 PO) Take by mouth.   Yes [provider]  blood glucose meter kit and supplies KIT Dispense based on patient and insurance preference. Use up to four times daily as directed. (FOR ICD-9 250.00, 250.01). 01/07/19  Yes Stallings, Zoe A, MD  Blood Glucose Monitoring Suppl (ONE TOUCH ULTRA SYSTEM KIT) W/DEVICE KIT 1 kit by Does not apply route once. 10/25/11  Yes Weber, Sarah L, PA-C  busPIRone (BUSPAR) 30 MG tablet Take 1 tablet (30 mg total) by mouth 2 (two) times daily. 05/21/21  Yes Donnal Moat T, PA-C  cholecalciferol (VITAMIN D3) 25 MCG (1000 UT) tablet Take 2,000 Units by mouth daily.   Yes [provider]  Dapagliflozin-metFORMIN HCl ER (XIGDUO XR) 07-998 MG TB24 Take 2 tablets by mouth daily. 07/02/21  Yes [name], MD  esomeprazole (NEXIUM) 40 MG capsule Take 1 capsule (40 mg total) by mouth daily. 04/11/17  Yes Jaynee Eagles, PA-C  ferrous sulfate 325 (65 FE) MG tablet Take 65 mg by mouth daily with breakfast.   Yes [provider]  fish oil-omega-3 fatty acids 1000 MG capsule Take 2  g by mouth daily.   Yes [provider]  gabapentin (NEURONTIN) 600 MG tablet Take 1 tablet (600 mg total) by mouth at bedtime. Take around 3-4 hours before bedtime. 10/22/21  Yes Hurst, Helene Kelp T, PA-C  Ginkgo Biloba Extract 60 MG CAPS Take by mouth.   Yes [provider]  glucose blood (ONE TOUCH ULTRA TEST) test strip 1 each by Other route daily. 12/01/14  Yes Jaynee Eagles, PA-C  lamoTRIgine (LAMICTAL) 200 MG tablet Take 1 tablet (200 mg total) by mouth 2 (two) times daily. 05/21/21  Yes Donnal Moat T, PA-C  Lancets [facility] ULTRASOFT) lancets USE AS INSTRUCTED 11/28/13  Yes [name], MD  levothyroxine (SYNTHROID) 175 MCG tablet Take 1 tablet (175  mcg total) by mouth daily before breakfast. 11/27/20  Yes [name], MD  olmesartan-hydrochlorothiazide (BENICAR HCT) 40-12.5 MG tablet Take 1 tablet by mouth every morning. 04/23/21  Yes Adrian Prows, MD  PARoxetine (PAXIL) 30 MG tablet Take 2 tablets (60 mg total) by mouth daily. 05/21/21  Yes Hurst, Teresa T, PA-C  glipiZIDE (GLUCOTROL) 10 MG tablet Take 1 tablet (10 mg total) by mouth 2 (two) times daily before a meal. Patient not taking: Reported on 11/05/2021 07/02/21   [name], MD   Social History   Socioeconomic History   Marital status: Married    Spouse name: [name]   Number of children: 3   Years of education: 11.5   Highest education level: Not on file  Occupational History    Employer: [employer]    Comment: Driver  Tobacco Use   Smoking status: Every Day    Packs/day: 1.50    Years: 37.00    Total pack years: 55.50    Types: Cigarettes    Start date: 04/02/2019   Smokeless tobacco: Never  Vaping Use   Vaping Use: Never used  Substance and Sexual Activity   Alcohol use: No    Comment: quit: 08/14/2011   Drug use: No   Sexual activity: Yes    Birth control/protection: None  Other Topics Concern   Not on file  Social History Narrative   Patient lives at home with family.    Caffeine Use: 1 pot of coffee daily   Social Determinants of Health   Financial Resource Strain: Not on file  Food Insecurity: Not on file  Transportation Needs: Not on file  Physical Activity: Not on file  Stress: Not on file  Social Connections: Not on file  Intimate Partner Violence: Not on file    Review of Systems  Constitutional:  Negative for fatigue and unexpected weight change.  Eyes:  Negative for visual disturbance.  Respiratory:  Negative for cough, chest tightness and shortness of breath.   Cardiovascular:  Negative for chest pain, palpitations and leg swelling.  Gastrointestinal:  Negative for abdominal pain and blood in stool.  Neurological:  Negative for  dizziness, light-headedness and headaches.     Objective:   Vitals:   11/05/21 1122  BP: 138/64  Pulse: 64  Resp: 20  Temp: 98 F (36.7 C)  SpO2: 94%  Weight: 217 lb 6.4 oz (98.6 kg)  Height: _0  (1.854 m)     Physical Exam Vitals reviewed.  Constitutional:      Appearance: He is well-developed.  HENT:     Head: Normocephalic and atraumatic.  Neck:     Vascular: No carotid bruit or JVD.  Cardiovascular:     Rate and Rhythm: Normal rate and regular rhythm.  Heart sounds: Normal heart sounds. No murmur heard. Pulmonary:     Effort: Pulmonary effort is normal.     Breath sounds: Normal breath sounds. No rales.  Musculoskeletal:     Right lower leg: No edema.     Left lower leg: No edema.  Skin:    General: Skin is warm and dry.  Neurological:     Mental Status: He is alert and oriented to person, place, and time.  Psychiatric:        Mood and Affect: Mood normal.        Assessment & Plan:  OSWALDO CUETO is a 61 y.o. male . Type 2 diabetes mellitus with hyperglycemia, without long-term current use of insulin (HCC) - Plan: POCT glycosylated hemoglobin (Hb A1C), POCT glucose (manual entry), Dapagliflozin-metFORMIN HCl ER (XIGDUO XR) 07-998 MG TB24  -Commended on improved control with avoidance of sugar-containing beverages, including with discontinuation of sulfonylurea.  Recommended continuance of Xigduo but consistent dosing 2/day, monitor home readings for significant highs or lows, otherwise recheck in 3 months.  No other med changes for now, blood pressure stable.  Fasting labs at next visit.  Meds ordered this encounter  Medications   Dapagliflozin-metFORMIN HCl ER (XIGDUO XR) 07-998 MG TB24    Sig: Take 2 tablets by mouth daily.    Dispense:  180 tablet    Refill:  1   Patient Instructions  Your A1c is better. Great work on [employer] changes! Stay off the glipizide for now, but take the xigduo 2 pills per day consistently and recheck in 3 months.  If low or high readings, let me know and we can make some other changes. Take care.     Signed,   Merri Ray, MD [place] 11/05/21 5:38 PM

## 2021-11-13 ENCOUNTER — Telehealth: Payer: BC Managed Care – PPO | Admitting: Adult Health

## 2021-11-23 ENCOUNTER — Other Ambulatory Visit: Payer: Self-pay | Admitting: Physician Assistant

## 2021-12-04 ENCOUNTER — Encounter: Payer: Self-pay | Admitting: Physician Assistant

## 2021-12-04 ENCOUNTER — Ambulatory Visit (INDEPENDENT_AMBULATORY_CARE_PROVIDER_SITE_OTHER): Payer: BC Managed Care – PPO | Admitting: Physician Assistant

## 2021-12-04 DIAGNOSIS — G4733 Obstructive sleep apnea (adult) (pediatric): Secondary | ICD-10-CM | POA: Diagnosis not present

## 2021-12-04 DIAGNOSIS — F411 Generalized anxiety disorder: Secondary | ICD-10-CM | POA: Diagnosis not present

## 2021-12-04 DIAGNOSIS — G2581 Restless legs syndrome: Secondary | ICD-10-CM

## 2021-12-04 DIAGNOSIS — F3341 Major depressive disorder, recurrent, in partial remission: Secondary | ICD-10-CM | POA: Diagnosis not present

## 2021-12-04 MED ORDER — GABAPENTIN 600 MG PO TABS: ORAL_TABLET | ORAL | 1 refills | Status: DC

## 2021-12-04 MED ORDER — DIAZEPAM 5 MG PO TABS: 2.5000 mg | ORAL_TABLET | Freq: Every day | ORAL | 1 refills | Status: DC

## 2021-12-04 NOTE — Progress Notes (Signed)
[facility]  Patient ID: Robert Wise,  MRN: [id number]  PCP: [name], MD  Date of Evaluation: 12/04/2021 Time spent:30 minutes  Chief Complaint:  Chief Complaint   Anxiety; Depression; Insomnia; Follow-up    HISTORY/CURRENT STATUS: HPI For routine med check.  He started the gabapentin close to 6 weeks ago and it has not helped the restless leg symptoms at all.  They shake so bad that even his dog has gone in the other room now.  He has had no side effects from the gabapentin that he is aware of.    He had an episode while he was driving at work about 3 weeks ago where his body drifted to the right side twice, his vision changed in both eyes which he describes as a gradual narrowing of light, lasted only a few minutes and then went away.  He pulled over until the symptoms subsided.  Then he drove on to his office.  He had no headache or dizziness, no chest pain, palpitations or shortness of breath.  No paresthesia or focal weakness.  That has never happened before or since.  Continues to smoke about 1.5 packs/day.  Not ready to quit.  Patient is able to enjoy things.  Energy and motivation are good.  Work is going well.   No extreme sadness, tearfulness, or feelings of hopelessness.  Sleeps well most of the time. ADLs and personal hygiene are normal.   Denies any changes in concentration, making decisions, or remembering things.  Appetite has not changed.  Weight is stable.  Does not typically have anxiety.  He does get "antsy" at times, impatient with people or things.  Sometimes in the evening before he lays down to go to sleep he does get nervous, knowing that he will have the legs jerking,.he is usually able to go to sleep but wakes up in about an hour to go to the bathroom, then will sleep for another hour, get up again but easy to go back to sleep.  Then he sleeps another 4 hours.  Still does not feel completely rested when he gets up.  Uses CPAP.  Denies suicidal  or homicidal thoughts.  Patient denies increased energy with decreased need for sleep, increased talkativeness, racing thoughts, impulsivity or risky behaviors, increased spending, increased libido, grandiosity, increased irritability or anger, paranoia, or hallucinations.  Denies dizziness, syncope, seizures, numbness, tingling, tremor, tics, unsteady gait, falls, slurred speech, confusion. Denies muscle or joint pain, stiffness, or dystonia.  His PCP takes care of cholesterol and glucose.  Individual Medical History/ Review of Systems: Changes? :No   he sees endocrinology for diabetes. See HPI.  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 Use up to four  times daily as directed. (FOR ICD-9 250.00, 250.01)., Disp: 1 each, Rfl: 0   Blood Glucose Monitoring Suppl (ONE TOUCH ULTRA SYSTEM KIT) W/DEVICE KIT, 1 kit by Does not apply route once., Disp: 1 each, Rfl: 0   busPIRone (BUSPAR) 30 MG tablet, TAKE 1 TABLET BY MOUTH 2 TIMES DAILY., Disp: 180 tablet, Rfl: 0   cholecalciferol (VITAMIN D3) 25 MCG (1000 UT) tablet, Take 2,000 Units by mouth daily., Disp: , Rfl:    Dapagliflozin-metFORMIN HCl ER (XIGDUO XR) 07-998  MG TB24, Take 2 tablets by mouth daily., Disp: 180 tablet, Rfl: 1   diazepam (VALIUM) 5 MG tablet, Take 0.5-1 tablets (2.5-5 mg total) by mouth at bedtime., Disp: 30 tablet, Rfl: 1   esomeprazole (NEXIUM) 40 MG capsule, Take 1 capsule (40 mg total) by mouth daily., Disp: 90 capsule, Rfl: 3   ferrous sulfate 325 (65 FE) MG tablet, Take 65 mg by mouth daily with breakfast., Disp: , Rfl:    fish oil-omega-3 fatty acids 1000 MG capsule, Take 2 g by mouth daily., Disp: , Rfl:    Ginkgo Biloba Extract 60 MG CAPS, Take by mouth., Disp: , Rfl:    glucose blood (ONE TOUCH ULTRA TEST) test strip, 1 each by Other route daily., Disp: 100 each, Rfl: 8   lamoTRIgine (LAMICTAL) 200 MG tablet, TAKE 1 TABLET BY MOUTH TWICE A DAY, Disp: 180 tablet, Rfl: 0   Lancets (ONETOUCH ULTRASOFT) lancets, USE AS INSTRUCTED, Disp: 100 each, Rfl: 0   levothyroxine (SYNTHROID) 175 MCG tablet, Take 1 tablet (175 mcg total) by mouth daily before breakfast., Disp: 90 tablet, Rfl: 3   olmesartan-hydrochlorothiazide (BENICAR HCT) 40-12.5 MG tablet, Take 1 tablet by mouth every morning., Disp: 100 tablet, Rfl: 3   PARoxetine (PAXIL) 30 MG tablet, TAKE 2 TABLETS BY MOUTH DAILY, Disp: 180 tablet, Rfl: 0   gabapentin (NEURONTIN) 600 MG tablet, Take 1/2 pill every evening for 1 week and then stop., Disp: 30 tablet, Rfl: 1 Medication Side Effects: none  Family Medical/ Social History: Changes? No  MENTAL HEALTH EXAM:  There were no vitals taken for this visit.There is no height or weight on file to calculate BMI.  General Appearance: Casual, Neat and Well Groomed  Eye Contact:  Good  Speech:  Clear and Coherent and Normal Rate  Volume:  Normal  Mood:  Euthymic  Affect:  Appropriate  Thought Process:  Goal Directed and Descriptions of Associations: Circumstantial  Orientation:  Full (Time, Place, and Person)  Thought Content: Logical   Suicidal Thoughts:  No  Homicidal Thoughts:  No  Memory:   Short-term memory can still be a  problem, like not knowing what he went into her room for, but no worse.  Judgement:  Good  Insight:  Good  Psychomotor Activity:  Normal  Concentration:  Concentration: Good and Attention Span: Fair  Recall:  Good  Fund of Knowledge: Good  Language: Good  Assets:  Desire for Improvement Financial Resources/Insurance Housing Transportation Vocational/Educational  ADL's:  Intact  Cognition: WNL  Prognosis:  Good   PCP follows his labs.  DIAGNOSES:    ICD-10-CM   1. Recurrent major depressive disorder, in partial remission ([place])  F33.41     2. Generalized anxiety disorder  F41.1     3. Obstructive sleep apnea  G47.33     4. Restless leg syndrome  G25.81      Receiving Psychotherapy: No   RECOMMENDATIONS:  PDMP was reviewed.  Last control substance was tramadol 01/27/2021. I provided  30 minutes of face to face time during this encounter, including time spent before and after the visit in records review, medical decision making, counseling pertinent to today's visit, and charting.   We discussed the episode he had while driving a few weeks ago. Amaurosis fugax? Differential diagnosis TIA, abnormal blood sugar, and others, he needs to see his PCP about this as soon as possible.  He has made an appointment for next week.  He knows to go to the ER if it should happen again.  Since the gabapentin is not helping the restless legs, we will wean off that by taking 300 mg daily for 1 week and then stop it.  He never felt comfortable taking it anyway.  Another option would be a low dose benzo, hopefully to help him relax and go to sleep.  We discussed benefits, risks and side effects and he would like to try 1.  He has heard bad things about Xanax and is comfortable taking Valium.  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  He understands.  Continue BuSpar 30 mg, 1 p.o. twice daily.  Start Valium 5 mg, 1/2-1 nightly as needed.           Continue Lamictal 200 mg, 1 p.o. twice daily. Continue Paxil 30 mg, 2 p.o. daily. Continue multivitamin, ginkgo biloba, B complex, fish oil, vitamin D. Continue using CPAP. Return in 6 wks.   Donnal Moat, PA-C

## 2021-12-20 ENCOUNTER — Other Ambulatory Visit: Payer: Self-pay | Admitting: Family Medicine

## 2021-12-20 DIAGNOSIS — E039 Hypothyroidism, unspecified: Secondary | ICD-10-CM

## 2022-01-07 ENCOUNTER — Encounter: Payer: Self-pay | Admitting: *Deleted

## 2022-01-07 NOTE — Progress Notes (Unsigned)
PATIENT: Robert Wise DOB: 1960-04-14  REASON FOR VISIT: follow up HISTORY FROM: patient PRIMARY NEUROLOGIST:   Virtual Visit via Video Note  I connected with Robert Wise on 01/08/22 at  1:15 PM EDT by a video enabled telemedicine application located remotely at [facility] and verified that I am speaking with the correct person using two identifiers who was located at their own home.   I discussed the limitations of evaluation and management by telemedicine and the availability of in person appointments. The patient expressed understanding and agreed to proceed.   PATIENT: Robert Wise DOB: 10/15/1960  REASON FOR VISIT: follow up HISTORY FROM: patient  HISTORY OF PRESENT ILLNESS: Today 01/08/22:  Mr. Brow is a 61 year old male with a history of obstructive sleep apnea on CPAP.  He returns today for follow-up.  He reports that the CPAP is working well for him.  There are some nights he was unable to use it but most of the time he tries to use it nightly.  He is not changing out his supplies now.  Download is below    REVIEW OF SYSTEMS: Out of a complete 14 system review of symptoms, the patient complains only of the following symptoms, and all other reviewed systems are negative.  ALLERGIES: Allergies  Allergen Reactions   Tricor [Fenofibrate]    Zocor [Simvastatin]     HOME MEDICATIONS: Outpatient Medications Prior to Visit  Medication Sig Dispense Refill   aspirin (ASPIRIN CHILDRENS) 81 MG chewable tablet Chew 1 tablet (81 mg total) by mouth daily.     atorvastatin (LIPITOR) 80 MG tablet Take 1 tablet (80 mg total) by mouth daily. 90 tablet 2   B Complex Vitamins (B-COMPLEX/B-12 PO) Take by mouth.     blood glucose meter kit and supplies KIT Dispense based on patient and insurance preference. Use up to four times daily as directed. (FOR ICD-9 250.00, 250.01). 1 each 0   Blood Glucose Monitoring Suppl (ONE TOUCH ULTRA SYSTEM KIT)  W/DEVICE KIT 1 kit by Does not apply route once. 1 each 0   busPIRone (BUSPAR) 30 MG tablet TAKE 1 TABLET BY MOUTH 2 TIMES DAILY. 180 tablet 0   cholecalciferol (VITAMIN D3) 25 MCG (1000 UT) tablet Take 2,000 Units by mouth daily.     Dapagliflozin-metFORMIN HCl ER (XIGDUO XR) 07-998 MG TB24 Take 2 tablets by mouth daily. 180 tablet 1   diazepam (VALIUM) 5 MG tablet Take 0.5-1 tablets (2.5-5 mg total) by mouth at bedtime. 30 tablet 1   esomeprazole (NEXIUM) 40 MG capsule Take 1 capsule (40 mg total) by mouth daily. 90 capsule 3   ferrous sulfate 325 (65 FE) MG tablet Take 65 mg by mouth daily with breakfast.     fish oil-omega-3 fatty acids 1000 MG capsule Take 2 g by mouth daily.     gabapentin (NEURONTIN) 600 MG tablet Take 1/2 pill every evening for 1 week and then stop. 30 tablet 1   Ginkgo Biloba Extract 60 MG CAPS Take by mouth.     glucose blood (ONE TOUCH ULTRA TEST) test strip 1 each by Other route daily. 100 each 8   lamoTRIgine (LAMICTAL) 200 MG tablet TAKE 1 TABLET BY MOUTH TWICE A DAY 180 tablet 0   Lancets (ONETOUCH ULTRASOFT) lancets USE AS INSTRUCTED 100 each 0   levothyroxine (SYNTHROID) 175 MCG tablet TAKE 1 TABLET BY MOUTH DAILY BEFORE BREAKFAST. 90 tablet 3   olmesartan-hydrochlorothiazide (BENICAR HCT) 40-12.5 MG tablet Take  1 tablet by mouth every morning. 100 tablet 3   PARoxetine (PAXIL) 30 MG tablet TAKE 2 TABLETS BY MOUTH DAILY 180 tablet 0   No facility-administered medications prior to visit.    PAST MEDICAL HISTORY: Past Medical History:  Diagnosis Date   Anxiety    Depression    Diabetes mellitus without complication (HCC)    Hyperlipidemia    OSA (obstructive sleep apnea) 12/15/2012   Sleep apnea     PAST SURGICAL HISTORY: Past Surgical History:  Procedure Laterality Date   ANKLE SURGERY Left 04/01/2004   CATARACT EXTRACTION Right 04/2021   VASECTOMY      FAMILY HISTORY: Family History  Problem Relation Age of Onset   Cancer Mother    Heart  failure Sister    Sleep apnea Neg Hx     SOCIAL HISTORY: Social History   Socioeconomic History   Marital status: Married    Spouse name: Neysa Bonito   Number of children: 3   Years of education: 11.5   Highest education level: Not on file  Occupational History    Employer: [employer]    Comment: Driver  Tobacco Use   Smoking status: Every Day    Packs/day: 1.50    Years: 37.00    Total pack years: 55.50    Types: Cigarettes    Start date: 04/02/2019   Smokeless tobacco: Never  Vaping Use   Vaping Use: Never used  Substance and Sexual Activity   Alcohol use: No    Comment: quit: 08/14/2011   Drug use: No   Sexual activity: Yes    Birth control/protection: None  Other Topics Concern   Not on file  Social History Narrative   Patient lives at home with family.    Caffeine Use: 1 pot of coffee daily   Social Determinants of Health   Financial Resource Strain: Not on file  Food Insecurity: Not on file  Transportation Needs: Not on file  Physical Activity: Not on file  Stress: Not on file  Social Connections: Not on file  Intimate Partner Violence: Not on file      PHYSICAL EXAM Generalized: Well developed, in no acute distress   Neurological examination  Mentation: Alert oriented to time, place, history taking. Follows all commands speech and language fluent Cranial nerve II-XII:Extraocular movements were full. Facial symmetry noted. Head turning and shoulder shrug  were normal and symmetric. Motor: Good strength throughout subjectively per patient Sensory: Sensory testing is intact to soft touch on all 4 extremities subjectively per patient Coordination: Cerebellar testing reveals good finger-nose-finger  Gait and station: Patient is able to stand from a seated position. gait is normal.  Reflexes: UTA  DIAGNOSTIC DATA (LABS, IMAGING, TESTING) - I reviewed patient records, labs, notes, testing and imaging myself where available.  Lab Results  Component Value  Date   WBC 9.8 06/02/2017   HGB 14.7 06/02/2017   HCT 42.6 06/02/2017   MCV 81 06/02/2017   PLT 253 06/02/2017      Component Value Date/Time   NA 137 07/02/2021 1252   NA 138 01/16/2021 1430   K 4.6 07/02/2021 1252   CL 101 07/02/2021 1252   CO2 25 07/02/2021 1252   GLUCOSE 201 (H) 07/02/2021 1252   BUN 17 07/02/2021 1252   BUN 18 01/16/2021 1430   CREATININE 0.90 07/02/2021 1252   CREATININE 0.72 12/29/2015 0909   CALCIUM 9.8 07/02/2021 1252   PROT 6.9 07/02/2021 1252   PROT 7.4 11/08/2019 1024  ALBUMIN 4.6 07/02/2021 1252   ALBUMIN 4.9 11/08/2019 1024   AST 18 07/02/2021 1252   ALT 30 07/02/2021 1252   ALKPHOS 79 07/02/2021 1252   BILITOT 0.5 07/02/2021 1252   BILITOT <0.2 11/08/2019 1024   GFRNONAA 98 04/26/2019 0848   GFRNONAA >89 07/28/2015 0847   GFRAA 113 04/26/2019 0848   GFRAA >89 07/28/2015 0847   Lab Results  Component Value Date   CHOL 141 07/02/2021   HDL 27.60 (L) 07/02/2021   LDLCALC 77 12/20/2019   LDLDIRECT 80.0 07/02/2021   TRIG 219.0 (H) 07/02/2021   CHOLHDL 5 07/02/2021   Lab Results  Component Value Date   HGBA1C 7.1 (A) 11/05/2021   Lab Results  Component Value Date   VITAMINB12 747 06/02/2017   Lab Results  Component Value Date   TSH 4.08 07/02/2021      ASSESSMENT AND PLAN 61 y.o. year old male  has a past medical history of Anxiety, Depression, Diabetes mellitus without complication (HCC), Hyperlipidemia, OSA (obstructive sleep apnea) (12/15/2012), and Sleep apnea. here with:  OSA on CPAP  CPAP compliance suboptimal  Residual AHI is good Encouraged patient to continue using CPAP nightly and > 4 hours each night F/U in 1 year or sooner if needed    Butch Penny, MSN, NP-C 01/08/2022, 1:00 PM [facility] [address], [address] [place], [place] [address] [phone]

## 2022-01-08 ENCOUNTER — Telehealth (INDEPENDENT_AMBULATORY_CARE_PROVIDER_SITE_OTHER): Payer: BC Managed Care – PPO | Admitting: Adult Health

## 2022-01-08 DIAGNOSIS — G4733 Obstructive sleep apnea (adult) (pediatric): Secondary | ICD-10-CM

## 2022-01-15 ENCOUNTER — Encounter: Payer: Self-pay | Admitting: Physician Assistant

## 2022-01-15 ENCOUNTER — Ambulatory Visit: Payer: BC Managed Care – PPO | Admitting: Physician Assistant

## 2022-01-15 DIAGNOSIS — F3341 Major depressive disorder, recurrent, in partial remission: Secondary | ICD-10-CM | POA: Diagnosis not present

## 2022-01-15 DIAGNOSIS — F17200 Nicotine dependence, unspecified, uncomplicated: Secondary | ICD-10-CM

## 2022-01-15 DIAGNOSIS — G4733 Obstructive sleep apnea (adult) (pediatric): Secondary | ICD-10-CM | POA: Diagnosis not present

## 2022-01-15 DIAGNOSIS — Z024 Encounter for examination for driving license: Secondary | ICD-10-CM | POA: Diagnosis not present

## 2022-01-15 DIAGNOSIS — G2581 Restless legs syndrome: Secondary | ICD-10-CM

## 2022-01-15 MED ORDER — DIAZEPAM 10 MG PO TABS: 10.0000 mg | ORAL_TABLET | Freq: Every evening | ORAL | 1 refills | Status: DC | PRN

## 2022-01-15 NOTE — Progress Notes (Signed)
[facility]  Patient ID: Robert Wise,  MRN: [id number]  PCP: [name], MD  Date of Evaluation: 01/15/2022 Time spent:20 minutes  Chief Complaint:  Chief Complaint   Anxiety; Depression; Insomnia; Follow-up    HISTORY/CURRENT STATUS: HPI For routine med check.  We started Valium at [place].  He takes it only at night.  It has helped him relax to go to sleep but he still wakes up often.  Still has some restlessness in his legs but might not be quite as bad as before.  He has a lot of trouble with the CPAP mask and has not used the CPAP consistently in a week or so.  He knows that he needs to.  Not having a lot of anxiety, does get overwhelmed sometimes but no panic attacks.  Patient is able to enjoy things.  Energy and motivation are fair to good depending on the day and how much sleep he has gotten.  Work is going well.   No extreme sadness, tearfulness, or feelings of hopelessness.   ADLs and personal hygiene are normal.   Still having trouble with memory but it is no worse.  Appetite has not changed.  Weight is stable.  Not isolating.  Denies suicidal or homicidal thoughts.  Still reports some irritability, easy anger. Patient denies increased energy with decreased need for sleep, increased talkativeness, racing thoughts, impulsivity or risky behaviors, increased spending, increased libido, grandiosity, paranoia, or hallucinations.  Denies dizziness, syncope, seizures, numbness, tingling, tremor, tics, unsteady gait, falls, slurred speech, confusion. Denies muscle or joint pain, stiffness, or dystonia.  His PCP takes care of cholesterol and glucose.  Individual Medical History/ Review of Systems: Changes? 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 Use up to four times daily as directed. (FOR ICD-9 250.00, 250.01)., Disp: 1 each, Rfl: 0   Blood Glucose Monitoring Suppl (ONE TOUCH ULTRA SYSTEM KIT) W/DEVICE KIT, 1 kit by Does not apply route once., Disp: 1 each, Rfl: 0   busPIRone (BUSPAR) 30 MG tablet, TAKE 1 TABLET BY MOUTH 2 TIMES DAILY., Disp: 180 tablet, Rfl: 0   cholecalciferol (VITAMIN D3) 25 MCG (1000 UT) tablet, Take 2,000 Units by mouth daily., Disp: , Rfl:    Dapagliflozin-metFORMIN HCl ER (XIGDUO XR) 07-998 MG TB24, Take 2 tablets by mouth daily., Disp: 180 tablet, Rfl: 1   diazepam (VALIUM) 10 MG tablet, Take 1 tablet (10 mg total) by mouth at bedtime as needed for anxiety., Disp: 30 tablet, Rfl: 1   esomeprazole (NEXIUM) 40 MG capsule, Take 1 capsule (40 mg total) by mouth daily., Disp: 90 capsule, Rfl: 3   ferrous sulfate 325 (65 FE) MG tablet, Take 65 mg by  mouth daily with breakfast., Disp: , Rfl:    fish oil-omega-3 fatty acids 1000 MG capsule, Take 2 g by mouth daily., Disp: , Rfl:    Ginkgo Biloba Extract 60 MG CAPS, Take by mouth., Disp: , Rfl:    glucose blood (ONE TOUCH ULTRA TEST) test strip, 1 each by Other route daily., Disp: 100 each, Rfl: 8   lamoTRIgine (LAMICTAL) 200 MG tablet, TAKE 1 TABLET BY MOUTH TWICE A DAY, Disp: 180 tablet, Rfl: 0   Lancets (ONETOUCH ULTRASOFT) lancets, USE AS INSTRUCTED, Disp: 100 each, Rfl: 0    levothyroxine (SYNTHROID) 175 MCG tablet, TAKE 1 TABLET BY MOUTH DAILY BEFORE BREAKFAST., Disp: 90 tablet, Rfl: 3   olmesartan-hydrochlorothiazide (BENICAR HCT) 40-12.5 MG tablet, Take 1 tablet by mouth every morning., Disp: 100 tablet, Rfl: 3   PARoxetine (PAXIL) 30 MG tablet, TAKE 2 TABLETS BY MOUTH DAILY, Disp: 180 tablet, Rfl: 0 Medication Side Effects: none  Family Medical/ Social History: Changes? No  MENTAL HEALTH EXAM:  There were no vitals taken for this visit.There is no height or weight on file to calculate BMI.  General Appearance: Casual, Neat and Well Groomed  Eye Contact:  Good  Speech:  Clear and Coherent and Normal Rate  Volume:  Normal  Mood:  Euthymic  Affect:  Appropriate  Thought Process:  Goal Directed and Descriptions of Associations: Circumstantial  Orientation:  Full (Time, Place, and Person)  Thought Content: Logical   Suicidal Thoughts:  No  Homicidal Thoughts:  No  Memory:   Short-term memory can still be a problem, like not knowing what he went into her room for, but no worse.  Judgement:  Good  Insight:  Good  Psychomotor Activity:  Normal  Concentration:  Concentration: Good and Attention Span: Fair  Recall:  Good  Fund of Knowledge: Good  Language: Good  Assets:  Desire for Improvement  ADL's:  Intact  Cognition: WNL  Prognosis:  Good   PCP follows his labs.  DIAGNOSES:    ICD-10-CM   1. Recurrent major depressive disorder, in partial remission ([place])  F33.41     2. Obstructive sleep apnea  G47.33     3. Restless leg syndrome  G25.81     4. Smoker  F17.200       Receiving Psychotherapy: No   RECOMMENDATIONS:  PDMP was reviewed. Valium filled 12/04/2021. I provided 20 minutes of face to face time during this encounter, including time spent before and after the visit in records review, medical decision making, counseling pertinent to today's visit, and charting.   We discussed the sleep apnea.  He has a new mask and strap and states he  will do better about using the CPAP.  Also discussed increasing the Valium.  He must use the CPAP though, Valium can cause respiratory depression.  He understands and is willing to take that risk, he promises to be consistent with the CPAP and if there are problems with the mask, he should contact the Home health agency that supplies the parts to see if they can fit him with a different mask.  Continue BuSpar 30 mg, 1 p.o. twice daily.  Increase Valium to 10 mg nightly as needed.        Continue Lamictal 200 mg, 1 p.o. twice daily. Continue Paxil 30 mg, 2 p.o. daily. Continue multivitamin, ginkgo biloba, B complex, fish oil, vitamin D. Continue using CPAP. Return in 4-6 wks.   Donnal Moat, PA-C

## 2022-01-22 ENCOUNTER — Ambulatory Visit: Payer: BC Managed Care – PPO

## 2022-01-22 DIAGNOSIS — I6523 Occlusion and stenosis of bilateral carotid arteries: Secondary | ICD-10-CM

## 2022-01-22 DIAGNOSIS — E7800 Pure hypercholesterolemia, unspecified: Secondary | ICD-10-CM

## 2022-01-24 NOTE — Progress Notes (Unsigned)
ICD-10-CM   1. Hypercholesteremia  Z32.99 Basic metabolic panel    Lipoprotein A (LPA)    Lipid Panel With LDL/HDL Ratio    2. Asymptomatic bilateral carotid artery stenosis  I65.23 PCV CAROTID DUPLEX (BILATERAL)    CT ANGIO HEAD NECK W WO CM    Basic metabolic panel    Lipoprotein A (LPA)    Lipid Panel With LDL/HDL Ratio     Orders Placed This Encounter  Procedures   CT ANGIO HEAD NECK W WO CM    Standing Status:   Future    Standing Expiration Date:   01/25/2023    Order Specific Question:   If indicated for the ordered procedure, I authorize the administration of contrast media per Radiology protocol    Answer:   Yes    Order Specific Question:   Preferred imaging location?    Answer:   [id number]   Basic metabolic panel   Lipoprotein A (LPA)   Lipid Panel With LDL/HDL Ratio   Carotid artery duplex 01/22/2022: Duplex suggests stenosis in the right internal carotid artery (80-99%). The right PSV internal/common carotid artery ratio of 5.21 is consistent with a stenosis of >70%. Peak velocity in the right ACA was 521/176 cm/s. No evidence of significant stenosis in the right external carotid artery. Duplex suggests stenosis in the left internal carotid artery (50-69%). No evidence of significant stenosis in the left external carotid artery. Antegrade right vertebral artery flow. Antegrade left vertebral artery flow. Compared to the study done on 07/18/2021, no significant change in the peak ICA/CCA ratio however velocity has increased from 130/115 cm/s.  Consider different modality management or referral for intervention. Follow up in six months is appropriate if clinically indicated.   Adrian Prows, MD, [facility] 01/24/2022, 11:19 PM Office: [phone] Fax: [phone] Pager: [phone]

## 2022-01-25 ENCOUNTER — Encounter: Payer: Self-pay | Admitting: Cardiology

## 2022-02-04 ENCOUNTER — Ambulatory Visit: Payer: BC Managed Care – PPO | Admitting: Cardiology

## 2022-02-05 ENCOUNTER — Other Ambulatory Visit: Payer: Self-pay | Admitting: Cardiology

## 2022-02-05 ENCOUNTER — Ambulatory Visit
Admission: RE | Admit: 2022-02-05 | Discharge: 2022-02-05 | Disposition: A | Discharge status: Home / Self Care | Payer: BC Managed Care – PPO | Source: Ambulatory Visit | Attending: Cardiology | Admitting: Cardiology

## 2022-02-05 DIAGNOSIS — I700 Atherosclerosis of aorta: Secondary | ICD-10-CM | POA: Diagnosis not present

## 2022-02-05 DIAGNOSIS — I6523 Occlusion and stenosis of bilateral carotid arteries: Secondary | ICD-10-CM

## 2022-02-05 DIAGNOSIS — J439 Emphysema, unspecified: Secondary | ICD-10-CM | POA: Diagnosis not present

## 2022-02-05 DIAGNOSIS — E7800 Pure hypercholesterolemia, unspecified: Secondary | ICD-10-CM | POA: Diagnosis not present

## 2022-02-05 MED ORDER — IOPAMIDOL (ISOVUE-370) INJECTION 76%
75.0000 mL | Freq: Once | INTRAVENOUS | Status: AC | PRN
  Administered 2022-02-05: 75 mL via INTRAVENOUS

## 2022-02-06 LAB — BASIC METABOLIC PANEL
BUN/Creatinine Ratio: 18 (ref 10–24)
BUN: 21 mg/dL (ref 8–27)
CO2: 20 mmol/L (ref 20–29)
Calcium: 9.7 mg/dL (ref 8.6–10.2)
Chloride: 103 mmol/L (ref 96–106)
Creatinine, Ser: 1.14 mg/dL (ref 0.76–1.27)
Glucose: 110 mg/dL — ABNORMAL HIGH (ref 70–99)
Potassium: 5 mmol/L (ref 3.5–5.2)
Sodium: 139 mmol/L (ref 134–144)
eGFR: 73 mL/min/{1.73_m2} (ref >59)

## 2022-02-06 LAB — LIPID PANEL WITH LDL/HDL RATIO
Cholesterol, Total: 151 mg/dL (ref 100–199)
HDL: 30 mg/dL — ABNORMAL LOW (ref >39)
LDL Chol Calc (NIH): 76 mg/dL (ref 0–99)
LDL/HDL Ratio: 2.5 ratio (ref 0.0–3.6)
Triglycerides: 276 mg/dL — ABNORMAL HIGH (ref 0–149)
VLDL Cholesterol Cal: 45 mg/dL — ABNORMAL HIGH (ref 5–40)

## 2022-02-06 LAB — LIPOPROTEIN A (LPA): Lipoprotein (a): 22.3 nmol/L (ref <75.0)

## 2022-02-07 DIAGNOSIS — Z024 Encounter for examination for driving license: Secondary | ICD-10-CM | POA: Diagnosis not present

## 2022-02-07 NOTE — Progress Notes (Signed)
CT angiogram of the head and neck 02/07/2022: 1. Negative CT head. 2. 80% diameter stenosis proximal right internal carotid artery due to calcified plaque. 3. 25% diameter stenosis proximal left internal carotid artery due to calcified plaque. 4. Mild stenosis cavernous carotid bilaterally due to atherosclerotic disease. 5. No intracranial large vessel occlusion or significant stenosis. 6. Aortic atherosclerosis.

## 2022-02-19 ENCOUNTER — Ambulatory Visit: Payer: BC Managed Care – PPO | Admitting: Cardiology

## 2022-02-19 ENCOUNTER — Encounter: Payer: Self-pay | Admitting: Cardiology

## 2022-02-19 VITALS — BP 135/64 | HR 64 | Ht 72.0 in | Wt 220.4 lb

## 2022-02-19 DIAGNOSIS — E782 Mixed hyperlipidemia: Secondary | ICD-10-CM | POA: Diagnosis not present

## 2022-02-19 DIAGNOSIS — I10 Essential (primary) hypertension: Secondary | ICD-10-CM

## 2022-02-19 DIAGNOSIS — I6523 Occlusion and stenosis of bilateral carotid arteries: Secondary | ICD-10-CM

## 2022-02-19 DIAGNOSIS — F17200 Nicotine dependence, unspecified, uncomplicated: Secondary | ICD-10-CM

## 2022-02-19 NOTE — Progress Notes (Signed)
Primary Physician/Referring:  [name], MD  Patient ID: Robert Wise, male    DOB: 1960/08/25, 61 y.o.   MRN: [id number]  Chief Complaint  Patient presents with   Hypertension   PAD   Coronary Artery Disease   Hyperlipidemia   Follow-up    14 weeks    HPI:    Robert Wise  is a 61 y.o. Caucasian white male with uncontrolled diabetes mellitus with peripheral neuropathy, hyperlipidemia, primary hypertension, OSA on CPAP and compliant, obesity, tobacco use disorder and who is a truck driver by profession, 50-60+ year history of smoking cigarettes, asymptomatic left subclavian artery stenosis, asymptomatic bilateral carotid artery stenosis.  He presents for 34-monthfollow-up following carotid artery duplex and CT angiogram of head and neck.  Presently he is doing well without any complaints.  He is continuing to make changes to his diet and incorporate regular exercise daily.  He is changed from regular sodas to diet sodas.  He continues to smoke 1 and half packs of cigarettes per day.  Denies chest pain or dyspnea, dyspnea has remained stable.  No PND or orthopnea.  Past Medical History:  Diagnosis Date   Anxiety    Depression    Diabetes mellitus without complication (HCC)    Hyperlipidemia    OSA (obstructive sleep apnea) 12/15/2012   Sleep apnea    Past Surgical History:  Procedure Laterality Date   ANKLE SURGERY Left 04/01/2004   CATARACT EXTRACTION Right 04/2021   VASECTOMY     Family History  Problem Relation Age of Onset   Cancer Mother    Heart failure Sister    Sleep apnea Neg Hx     Social History   Tobacco Use   Smoking status: Every Day    Packs/day: 1.50    Years: 37.00    Total pack years: 55.50    Types: Cigarettes    Start date: 04/02/2019   Smokeless tobacco: Never  Substance Use Topics   Alcohol use: No    Comment: quit: 08/14/2011   Marital Status: Married  ROS  Review of Systems  Cardiovascular:  Negative for chest pain,  dyspnea on exertion, leg swelling and syncope.  Respiratory:  Positive for snoring.   Gastrointestinal:  Negative for melena.   Objective  Blood pressure 135/64, pulse 64, height 6' (1.829 m), weight 220 lb 6.4 oz (100 kg), SpO2 96 %. Body mass index is 29.89 kg/m.     02/19/2022    9:04 AM 02/19/2022    8:53 AM 11/05/2021   11:22 AM  Vitals with BMI  Height  _0  _1   Weight  220 lbs 6 oz 217 lbs 6 oz  BMI  293.73242.87 Systolic [id number] Diastolic 64 79 64  Pulse 64 68 64    Physical Exam Constitutional:      Appearance: He is obese.  Neck:     Vascular: Carotid bruit (bilateral carotid bruit and prominant left subclavian artery bruit) present. No JVD.  Cardiovascular:     Rate and Rhythm: Normal rate and regular rhythm.     Pulses: Normal pulses and intact distal pulses.          Carotid pulses are 2+ on the right side with bruit and 2+ on the left side with bruit.    Heart sounds: Murmur heard.     Early systolic murmur is present with a grade of 2/6 at the upper right sternal border.  No gallop.  Pulmonary:     Effort: Pulmonary effort is normal.     Breath sounds: Normal breath sounds.  Abdominal:     General: Bowel sounds are normal.     Palpations: Abdomen is soft.  Musculoskeletal:        General: No swelling.     Laboratory examination:   Lab Results  Component Value Date   NA 139 02/05/2022   K 5.0 02/05/2022   CO2 20 02/05/2022   GLUCOSE 110 (H) 02/05/2022   BUN 21 02/05/2022   CREATININE 1.14 02/05/2022   CALCIUM 9.7 02/05/2022   EGFR 73 02/05/2022   GFRNONAA 98 04/26/2019    Recent Labs    07/02/21 1252 02/05/22 0856  NA 137 139  K 4.6 5.0  CL 101 103  CO2 25 20  GLUCOSE 201* 110*  BUN 17 21  CREATININE 0.90 1.14  CALCIUM 9.8 9.7   estimated creatinine clearance is 83.4 mL/min (by C-G formula based on SCr of 1.14 mg/dL).     Latest Ref Rng & Units 02/05/2022    8:56 AM 07/02/2021   12:52 PM 01/16/2021    2:30 PM  CMP   Glucose 70 - 99 mg/dL 110  201  131   BUN 8 - 27 mg/dL _0 Creatinine 0.76 - 1.27 mg/dL 1.14  0.90  1.08   Sodium 134 - 144 mmol/L 139  137  138   Potassium 3.5 - 5.2 mmol/L 5.0  4.6  4.6   Chloride 96 - 106 mmol/L 103  101  99   CO2 20 - 29 mmol/L _1 Calcium 8.6 - 10.2 mg/dL 9.7  9.8  9.5   Total Protein 6.0 - 8.3 g/dL  6.9    Total Bilirubin 0.2 - 1.2 mg/dL  0.5    Alkaline Phos 39 - 117 U/L  79    AST 0 - 37 U/L  18    ALT 0 - 53 U/L  30        Latest Ref Rng & Units 06/02/2017    4:59 PM 07/28/2015    8:50 AM 02/09/2014    6:34 PM  CBC  WBC 3.4 - 10.8 x10E3/uL 9.8  8.6  8.7   Hemoglobin 13.0 - 17.7 g/dL 14.7  15.5  14.5   Hematocrit 37.5 - 51.0 % 42.6  43.0  43.7   Platelets 150 - 379 x10E3/uL 253       Lipid Panel Recent Labs    07/02/21 1252 02/05/22 0856  CHOL 141 151  TRIG 219.0* 276*  LDLCALC  --  76  VLDL 43.8*  --   HDL 27.60* 30*  CHOLHDL 5  --   LDLDIRECT 80.0  --     HEMOGLOBIN A1C Lab Results  Component Value Date   HGBA1C 7.1 (A) 11/05/2021   MPG 177 12/29/2015   TSH Recent Labs    07/02/21 1252  TSH 4.08   Medications and allergies   Allergies  Allergen Reactions   Tricor [Fenofibrate]    Zocor [Simvastatin]     Medication list after today's encounter   Current Outpatient Medications:    aspirin (ASPIRIN CHILDRENS) 81 MG chewable tablet, Chew 1 tablet (81 mg total) by mouth daily., Disp: , Rfl:    atorvastatin (LIPITOR) 80 MG tablet, Take 1 tablet (80 mg total) by mouth daily., Disp: 90 tablet, Rfl: 2   B Complex Vitamins (B-COMPLEX/B-12 PO), Take by  mouth., Disp: , Rfl:    blood glucose meter kit and supplies KIT, Dispense based on patient and insurance preference. Use up to four times daily as directed. (FOR ICD-9 250.00, 250.01)., Disp: 1 each, Rfl: 0   Blood Glucose Monitoring Suppl (ONE TOUCH ULTRA SYSTEM KIT) W/DEVICE KIT, 1 kit by Does not apply route once., Disp: 1 each, Rfl: 0   busPIRone (BUSPAR) 30 MG  tablet, TAKE 1 TABLET BY MOUTH 2 TIMES DAILY., Disp: 180 tablet, Rfl: 0   cholecalciferol (VITAMIN D3) 25 MCG (1000 UT) tablet, Take 2,000 Units by mouth daily., Disp: , Rfl:    Dapagliflozin-metFORMIN HCl ER (XIGDUO XR) 07-998 MG TB24, Take 2 tablets by mouth daily., Disp: 180 tablet, Rfl: 1   esomeprazole (NEXIUM) 40 MG capsule, Take 1 capsule (40 mg total) by mouth daily., Disp: 90 capsule, Rfl: 3   ferrous sulfate 325 (65 FE) MG tablet, Take 65 mg by mouth daily with breakfast., Disp: , Rfl:    fish oil-omega-3 fatty acids 1000 MG capsule, Take 2 g by mouth daily., Disp: , Rfl:    Ginkgo Biloba Extract 60 MG CAPS, Take by mouth., Disp: , Rfl:    glucose blood (ONE TOUCH ULTRA TEST) test strip, 1 each by Other route daily., Disp: 100 each, Rfl: 8   lamoTRIgine (LAMICTAL) 200 MG tablet, TAKE 1 TABLET BY MOUTH TWICE A DAY, Disp: 180 tablet, Rfl: 0   Lancets (ONETOUCH ULTRASOFT) lancets, USE AS INSTRUCTED, Disp: 100 each, Rfl: 0   levothyroxine (SYNTHROID) 175 MCG tablet, TAKE 1 TABLET BY MOUTH DAILY BEFORE BREAKFAST., Disp: 90 tablet, Rfl: 3   olmesartan-hydrochlorothiazide (BENICAR HCT) 40-12.5 MG tablet, Take 1 tablet by mouth every morning., Disp: 100 tablet, Rfl: 3   PARoxetine (PAXIL) 30 MG tablet, TAKE 2 TABLETS BY MOUTH DAILY, Disp: 180 tablet, Rfl: 0    Radiology:   CXR 2 view 05/29/2021: Subtle increased markings in the medial right lower lung fields suggest subsegmental atelectasis/pneumonia. There is no pleural effusion or pneumothorax.  CT angiogram of the head and neck 02/07/2022: 1. Negative CT head. 2. 80% diameter stenosis proximal right internal carotid artery due to calcified plaque. 3. 25% diameter stenosis proximal left internal carotid artery due to calcified plaque. 4. Mild stenosis cavernous carotid bilaterally due to atherosclerotic disease. 5. No intracranial large vessel occlusion or significant stenosis. 6. Aortic atherosclerosis.  Cardiac Studies:   PCV  MYOCARDIAL PERFUSION WO LEXISCAN 12/19/2020  1 Day Rest/Stress Protocol. Stress EKG is non-diagnostic, as this is pharmacological stress test. Small size, mild intensity, fixed perfusion defect most likely secondary to soft tissue attenuation (images obtained while sitting upright and BMI >30).  Without convincing evidence of reversible myocardial ischemia or prior infarct. LVEF 56%, LV size mildly dilated, wall thickness is preserved without regional wall motion abnormalities. No prior studies available for comparison. Low risk study.   PCV ECHOCARDIOGRAM COMPLETE 12/27/2020 Narrative Echocardiogram 12/27/2020: Left ventricle cavity is normal in size. Moderate concentric hypertrophy of the left ventricle. Normal global wall motion. Normal LV systolic function with EF 68%. Doppler evidence of grade I (impaired) diastolic dysfunction, normal LAP. Left atrial cavity is borderline dilated. Trileaflet aortic valve.  Mild (Grade I) aortic regurgitation. Mild (Grade I) mitral regurgitation. Mild tricuspid regurgitation. No evidence of pulmonary hypertension.  Zio Patch Extended out patient EKG monitoring 7 days starting 12/27/2020: Predominant rhythm is normal sinus rhythm.  There were 3 brief atrial tachycardia episodes. There were occasional PACs, rare PVCs. There were no diary entries however  triggered events correlated with PACs and PVCs. There was no atrial fibrillation, there was no heart block, no complex ventricular arrhythmias.    Carotid artery duplex 07/17/2021:  Duplex suggests stenosis in the right internal carotid artery (>=70%). The right PSV internal/common carotid artery ratio of 5.42 is consistent with a stenosis of >70%. Peak velocity in the proximal ICA.  130/115 cm/s. Duplex suggests stenosis in the right common carotid artery (<50%).  Duplex suggests stenosis in the right external carotid artery (>50%). Duplex suggests stenosis in the left internal carotid artery (50-69%).  Duplex suggests stenosis in the left common carotid artery (<50%). Duplex suggests stenosis in the left external carotid artery (<50%). The left vertebral artery waveform demonstrates an antegrade, high-resistant flow pattern, may suggest distal disease. Antegrade right vertebral artery flow. Antegrade left vertebral artery flow. Compared to the study done on 12/27/2020, there is progression of disease in the right ACA.  Left vertebral artery flow now revealing high-grade stent decreased flow suggestive of distal disease. Follow up in six months is appropriate if clinically indicated.  Carotid artery duplex 01/22/2022: Duplex suggests stenosis in the right internal carotid artery (80-99%). The right PSV internal/common carotid artery ratio of 5.21 is consistent with a stenosis of >70%. Peak velocity in the right ACA was 521/176 cm/s. No evidence of significant stenosis in the right external carotid artery. Duplex suggests stenosis in the left internal carotid artery (50-69%). No evidence of significant stenosis in the left external carotid artery. Antegrade right vertebral artery flow. Antegrade left vertebral artery flow. Compared to the study done on 07/18/2021, no significant change in the peak ICA/CCA ratio however velocity has increased from 130/115 cm/s.  Consider different modality management or referral for intervention. Follow up in six months is appropriate if clinically indicated.   EKG:   EKG 02/19/2022: Normal sinus rhythm at rate of 63 bpm.  Normal axis.  Borderline left atrial enlargement.  Anterior lateral ST elevation, early repolarization variant.  EKG 07/23/2021: Normal sinus rhythm at rate of 62 bpm, borderline left atrial enlargement.  Otherwise normal EKG.  Assessment     ICD-10-CM   1. Asymptomatic bilateral carotid artery stenosis  I65.23 EKG 12-Lead    Ambulatory referral to Vascular Surgery    2. Primary hypertension  I10 EKG 12-Lead    3. Mixed hyperlipidemia   E78.2     4. Smoking  F17.200        Medications Discontinued During This Encounter  Medication Reason   diazepam (VALIUM) 10 MG tablet Patient Preference     No orders of the defined types were placed in this encounter.  Orders Placed This Encounter  Procedures   Ambulatory referral to Vascular Surgery    Referral Priority:   Routine    Referral Type:   Surgical    Referral Reason:   Specialty Services Required    Requested Specialty:   Vascular Surgery    Number of Visits Requested:   1   EKG 12-Lead    Recommendations:   Robert Wise is a 61 y.o. Caucasian white male with uncontrolled diabetes mellitus with peripheral neuropathy, hyperlipidemia, primary hypertension, OSA on CPAP and compliant, obesity, tobacco use disorder and who is a truck driver by profession, 50-60+ year history of smoking cigarettes asymptomatic left subclavian artery stenosis and bilateral asymptomatic carotid artery stenosis presents here for a 76-monthoffice visit.  Asymptomatic bilateral carotid artery stenosis Recent CT angiogram of head and neck on 02/07/2022 revealed 80% diameter stenosis of the proximal  right internal carotid artery and 25% diameter stenosis of proximal left internal carotid artery. He remains asymptomatic. We will place referral to vascular surgery for further evaluation and treatment of right internal carotid artery.  Primary hypertension Blood pressure is well controlled.  Continue current medications without changes. Discussed the importance of low-sodium diet, exercise, and continued weight loss.  Mixed hyperlipidemia His goal LDL is <70 if not 55.  His triglycerides need to be controlled, since he has made diet changes and is losing weight I suspect this will also improve.  Smoking He continues to smoke 1-1/2 packs cigarettes/day. He is willing and ready to quit.  We discussed the importance of smoking cessation for 5 minutes.  Discussed decreasing from a pack and a  half to at least 1 pack/day and continuing to decrease the amount of cigarettes he is smoking. We also discussed the use of nicotine gum and patches and at this time he would like to hold off on this.  He has tried medication in the past to assist with smoking cessation however he stopped taking the medication because he did not like the way it made the cigarettes taste.   Follow-up in 6 months or sooner if needed.    Ernst Spell, AGNP-C 02/19/2022, 10:55 AM Office: [phone]

## 2022-02-21 ENCOUNTER — Other Ambulatory Visit: Payer: Self-pay | Admitting: Family Medicine

## 2022-02-25 ENCOUNTER — Encounter: Payer: Self-pay | Admitting: Family Medicine

## 2022-02-25 ENCOUNTER — Ambulatory Visit: Payer: BC Managed Care – PPO | Admitting: Family Medicine

## 2022-02-25 VITALS — BP 128/60 | HR 57 | Temp 97.9°F | Ht 72.0 in | Wt 222.0 lb

## 2022-02-25 DIAGNOSIS — E1165 Type 2 diabetes mellitus with hyperglycemia: Secondary | ICD-10-CM | POA: Diagnosis not present

## 2022-02-25 DIAGNOSIS — E039 Hypothyroidism, unspecified: Secondary | ICD-10-CM | POA: Diagnosis not present

## 2022-02-25 DIAGNOSIS — Z23 Encounter for immunization: Secondary | ICD-10-CM | POA: Diagnosis not present

## 2022-02-25 DIAGNOSIS — F17210 Nicotine dependence, cigarettes, uncomplicated: Secondary | ICD-10-CM

## 2022-02-25 DIAGNOSIS — E782 Mixed hyperlipidemia: Secondary | ICD-10-CM | POA: Diagnosis not present

## 2022-02-25 MED ORDER — XIGDUO XR 5-1000 MG PO TB24: 2.0000 | ORAL_TABLET | Freq: Every day | ORAL | 1 refills | Status: DC

## 2022-02-25 MED ORDER — ATORVASTATIN CALCIUM 80 MG PO TABS: 80.0000 mg | ORAL_TABLET | Freq: Every day | ORAL | 2 refills | Status: DC

## 2022-02-25 NOTE — Patient Instructions (Addendum)
See info on quitting smoking below. That is one of the most important things you can do for your health. Good luck!  Improve diet should help blood sugar, but I will check your A1c and thyroid test today.  Depending on A1c level we do have the option of one of the injectable medicines for improved diabetes control which may also help manage weight.  Continue Xigduo twice per day for now. Follow-up in 3 months, unless concerns on labs.  Let me know if there are questions and take care!  Managing the Challenge of Quitting Smoking Quitting smoking is a physical and mental challenge. You may have cravings, withdrawal symptoms, and temptation to smoke. Before quitting, work with your health care provider to make a plan that can help you manage quitting. Making a plan before you quit may keep you from smoking when you have the urge to smoke while trying to quit. How to manage lifestyle changes Managing stress Stress can make you want to smoke, and wanting to smoke may cause stress. It is important to find ways to manage your stress. You could try some of the following: Practice relaxation techniques. Breathe slowly and deeply, in through your nose and out through your mouth. Listen to music. Soak in a bath or take a shower. Imagine a peaceful place or vacation. Get some support. Talk with family or friends about your stress. Join a support group. Talk with a counselor or therapist. Get some physical activity. Go for a walk, run, or bike ride. Play a favorite sport. Practice yoga.  Medicines Talk with your health care provider about medicines that might help you deal with cravings and make quitting easier for you. Relationships Social situations can be difficult when you are quitting smoking. To manage this, you can: Avoid parties and other social situations where people might be smoking. Avoid alcohol. Leave right away if you have the urge to smoke. Explain to your family and friends that  you are quitting smoking. Ask for support and let them know you might be a bit grumpy. Plan activities where smoking is not an option. General instructions Be aware that many people gain weight after they quit smoking. However, not everyone does. To keep from gaining weight, have a plan in place before you quit, and stick to the plan after you quit. Your plan should include: Eating healthy snacks. When you have a craving, it may help to: Eat popcorn, or try carrots, celery, or other cut vegetables. Chew sugar-free gum. Changing how you eat. Eat small portion sizes at meals. Eat 4-6 small meals throughout the day instead of 1-2 large meals a day. Be mindful when you eat. You should avoid watching television or doing other things that might distract you as you eat. Exercising regularly. Make time to exercise each day. If you do not have time for a long workout, do short bouts of exercise for 5-10 minutes several times a day. Do some form of strengthening exercise, such as weight lifting. Do some exercise that gets your heart beating and causes you to breathe deeply, such as walking fast, running, swimming, or biking. This is very important. Drinking plenty of water or other low-calorie or no-calorie drinks. Drink enough fluid to keep your urine pale yellow.  How to recognize withdrawal symptoms Your body and mind may experience discomfort as you try to get used to not having nicotine in your system. These effects are called withdrawal symptoms. They may include: Feeling hungrier than normal. Having trouble  concentrating. Feeling irritable or restless. Having trouble sleeping. Feeling depressed. Craving a cigarette. These symptoms may surprise you, but they are normal to have when quitting smoking. To manage withdrawal symptoms: Avoid places, people, and activities that trigger your cravings. Remember why you want to quit. Get plenty of sleep. Avoid coffee and other drinks that contain  caffeine. These may worsen some of your symptoms. How to manage cravings Come up with a plan for how to deal with your cravings. The plan should include the following: A definition of the specific situation you want to deal with. An activity or action you will take to replace smoking. A clear idea for how this action will help. The name of someone who could help you with this. Cravings usually last for 5-10 minutes. Consider taking the following actions to help you with your plan to deal with cravings: Keep your mouth busy. Chew sugar-free gum. Suck on hard candies or a straw. Brush your teeth. Keep your hands and body busy. Change to a different activity right away. Squeeze or play with a ball. Do an activity or a hobby, such as making bead jewelry, practicing needlepoint, or working with wood. Mix up your normal routine. Take a short exercise break. Go for a quick walk, or run up and down stairs. Focus on doing something kind or helpful for someone else. Call a friend or family member to talk during a craving. Join a support group. Contact a quitline. Where to find support To get help or find a support group: Call the National Cancer Institute's Smoking Quitline: 1-800-QUIT-NOW 206-265-3650) Text QUIT to SmokefreeTXT: 130865 Where to find more information Visit these websites to find more information on quitting smoking: [place] Department of Health and Human Services: www.smokefree.gov American Lung Association: www.freedomfromsmoking.org Centers for Disease Control and Prevention (CDC): FootballExhibition.com.br American Heart Association: www.heart.org Contact a health care provider if: You want to change your plan for quitting. The medicines you are taking are not helping. Your eating feels out of control or you cannot sleep. You feel depressed or become very anxious. Summary Quitting smoking is a physical and mental challenge. You will face cravings, withdrawal symptoms, and temptation to  smoke again. Preparation can help you as you go through these challenges. Try different techniques to manage stress, handle social situations, and prevent weight gain. You can deal with cravings by keeping your mouth busy (such as by chewing gum), keeping your hands and body busy, calling family or friends, or contacting a quitline for people who want to quit smoking. You can deal with withdrawal symptoms by avoiding places where people smoke, getting plenty of rest, and avoiding drinks that contain caffeine. This information is not intended to replace advice given to you by your health care provider. Make sure you discuss any questions you have with your health care provider. Document Revised: 03/09/2021 Document Reviewed: 03/09/2021 Elsevier Patient Education  2023 [employer]   Steps to Quit Smoking Smoking tobacco is the leading cause of preventable death. It can affect almost every organ in the body. Smoking puts you and those around you at risk for developing many serious chronic diseases. Quitting smoking can be very challenging. Do not get discouraged if you are not successful the first time. Some people need to make many attempts to quit before they achieve long-term success. Do your best to stick to your quit plan, and talk with your health care provider if you have any questions or concerns. How do I get ready to quit?  When you decide to quit smoking, create a plan to help you succeed. Before you quit: Pick a date to quit. Set a date within the next 2 weeks to give you time to prepare. Write down the reasons why you are quitting. Keep this list in places where you will see it often. Tell your family, friends, and co-workers that you are quitting. Support from people you are close to can make quitting easier. Talk with your health care provider about your options for quitting smoking. Find out what treatment options are covered by your health insurance. Identify people, places,  things, and activities that make you want to smoke (triggers). Avoid them. What first steps can I take to quit smoking? Throw away all cigarettes at home, at work, and in your car. Throw away smoking accessories, such as Set designer. Clean your car. Make sure to empty the ashtray. Clean your home, including curtains and carpets. What strategies can I use to quit smoking? Talk with your health care provider about combining strategies, such as taking medicines while you are also receiving in-person counseling. Using these two strategies together makes you more likely to succeed in quitting than if you used either strategy on its own. If you are pregnant or breastfeeding, talk with your health care provider about finding counseling or other support strategies to quit smoking. Do not take medicine to help you quit smoking unless your health care provider tells you to. Quit right away Quit smoking completely, instead of gradually reducing how much you smoke over a period of time. Stopping smoking right away may be more successful than gradually quitting. Attend in-person counseling to help you build problem-solving skills. You are more likely to succeed in quitting if you attend counseling sessions regularly. Even short sessions of 10 minutes can be effective. Take medicine You may take medicines to help you quit smoking. Some medicines require a prescription. You can also purchase over-the-counter medicines. Medicines may have nicotine in them to replace the nicotine in cigarettes. Medicines may: Help to stop cravings. Help to relieve withdrawal symptoms. Your health care provider may recommend: Nicotine patches, gum, or lozenges. Nicotine inhalers or sprays. Non-nicotine medicine that you take by mouth. Find resources Find resources and support systems that can help you quit smoking and remain smoke-free after you quit. These resources are most helpful when you use them often. They  include: Online chats with a Veterinary surgeon. Telephone quitlines. Printed Materials engineer. Support groups or group counseling. Text messaging programs. Mobile phone apps or applications. Use apps that can help you stick to your quit plan by providing reminders, tips, and encouragement. Examples of free services include Quit Guide from the CDC and smokefree.gov  What can I do to make it easier to quit?  Reach out to your family and friends for support and encouragement. Call telephone quitlines, such as 1-800-QUIT-NOW, reach out to support groups, or work with a counselor for support. Ask people who smoke to avoid smoking around you. Avoid places that trigger you to smoke, such as bars, parties, or smoke-break areas at work. Spend time with people who do not smoke. Lessen the stress in your life. Stress can be a smoking trigger for some people. To lessen stress, try: Exercising regularly. Doing deep-breathing exercises. Doing yoga. Meditating. What benefits will I see if I quit smoking? Over time, you should start to see positive results, such as: Improved sense of smell and taste. Decreased coughing and sore throat. Slower heart rate. Lower blood pressure.  Clearer and healthier skin. The ability to breathe more easily. Fewer sick days. Summary Quitting smoking can be very challenging. Do not get discouraged if you are not successful the first time. Some people need to make many attempts to quit before they achieve long-term success. When you decide to quit smoking, create a plan to help you succeed. Quit smoking right away, not slowly over a period of time. Find resources and support systems that can help you quit smoking and remain smoke-free after you quit. This information is not intended to replace advice given to you by your health care provider. Make sure you discuss any questions you have with your health care provider. Document Revised: 03/09/2021 Document Reviewed:  03/09/2021 Elsevier Patient Education  2023 [employer].

## 2022-02-25 NOTE — Progress Notes (Unsigned)
Subjective:  Patient ID: Robert Wise, male    DOB: 1961-02-07  Age: 61 y.o. MRN: [id number]  CC:  Chief Complaint  Patient presents with   Diabetes    Pt states all is well    HPI Robert Wise presents for   Diabetes: Complicated by hyperglycemia  Treated with Xigduo 07/998 mg, had taken 2/day only if elevated blood sugars when discussed in August.  Usually 1/day.  Glipizide discontinued previously with low readings.  Had cut back on sugar-containing drinks, and in doing so had noticed some lower readings previously when still taking glipizide.  A1c improved at his August 7 visit, consistent with twice daily dosing recommended. He is on statin with Lipitor, ARB with Benicar HCT.  Followed by cardiology, lipid panel November 7. High readings fasting: 151 Home readings postprandial: 180.  Consistently taking xigduo BID, some dietary indiscretion since last visit.  No FH of MEN syndrome, or thyroid CA, no hx of pancreatitis. Would consider GLP1 injection.   Microalbumin: 3.7 on 07/02/2021 Optho, foot exam, pneumovax: Up-to-date.  Lab Results  Component Value Date   HGBA1C 7.1 (A) 11/05/2021   HGBA1C 8.3 (H) 07/02/2021   HGBA1C 9.4 (H) 11/27/2020   Lab Results  Component Value Date   MICROALBUR 3.7 (H) 07/02/2021   LDLCALC 76 02/05/2022   CREATININE 1.14 02/05/2022   Hypothyroidism: Lab Results  Component Value Date   TSH 4.08 07/02/2021  Taking medication daily.   Synthroid 175 mcg daily No new hot or cold intolerance. No new hair or skin changes, heart palpitations or new fatigue. No new weight changes.  Wt Readings from Last 3 Encounters:  02/25/22 222 lb (100.7 kg)  02/19/22 220 lb 6.4 oz (100 kg)  11/05/21 217 lb 6.4 oz (98.6 kg)   Hypertension, hyperlipidemia, CAD, PAD, asx carotid artery stenosis. recent visit with cardiology including labs as above on 11/22.  Continues on Benicar HCT 40/12.5 mg daily,Lipitor 80 mg daily.  Nicotine addiction: Still  smoking 1 and 1/2 ppd. Acupuncture in past - min relief. Has patches - plans to quit with patches over new years. Has tried to quit 3 times prior. Felt sick with chantix.  Discussed cessation for 3 mins.    History Patient Active Problem List   Diagnosis Date Noted   Anxiety state 01/25/2018   Fatigue 01/25/2018   Uncontrolled type 2 diabetes mellitus with complication, without long-term current use of insulin 02/10/2015   Chronic pain 12/01/2014   Depression 10/12/2014   Past Medical History:  Diagnosis Date   Anxiety    Depression    Diabetes mellitus without complication ([place])    Hyperlipidemia    OSA (obstructive sleep apnea) 12/15/2012   Sleep apnea    Past Surgical History:  Procedure Laterality Date   ANKLE SURGERY Left 04/01/2004   CATARACT EXTRACTION Right 04/2021   VASECTOMY     Allergies  Allergen Reactions   Tricor [Fenofibrate]    Zocor [Simvastatin]    Prior to Admission medications   Medication Sig Start Date End Date Taking? Authorizing Provider  aspirin (ASPIRIN CHILDRENS) 81 MG chewable tablet Chew 1 tablet (81 mg total) by mouth daily. 12/06/20  Yes Adrian Prows, MD  atorvastatin (LIPITOR) 80 MG tablet Take 1 tablet (80 mg total) by mouth daily. 07/02/21  Yes [name], MD  B Complex Vitamins (B-COMPLEX/B-12 PO) Take by mouth.   Yes [provider]  blood glucose meter kit and supplies KIT Dispense based on patient and  insurance preference. Use up to four times daily as directed. (FOR ICD-9 250.00, 250.01). 01/07/19  Yes Stallings, Zoe A, MD  Blood Glucose Monitoring Suppl (ONE TOUCH ULTRA SYSTEM KIT) W/DEVICE KIT 1 kit by Does not apply route once. 10/25/11  Yes Weber, Sarah L, PA-C  busPIRone (BUSPAR) 30 MG tablet TAKE 1 TABLET BY MOUTH 2 TIMES DAILY. 11/23/21  Yes Hurst, Dorothea Glassman, PA-C  cholecalciferol (VITAMIN D3) 25 MCG (1000 UT) tablet Take 2,000 Units by mouth daily.   Yes [provider]  Dapagliflozin-metFORMIN HCl ER (XIGDUO XR)  07-998 MG TB24 Take 2 tablets by mouth daily. 11/05/21  Yes [name], MD  esomeprazole (NEXIUM) 40 MG capsule Take 1 capsule (40 mg total) by mouth daily. 04/11/17  Yes Jaynee Eagles, PA-C  ferrous sulfate 325 (65 FE) MG tablet Take 65 mg by mouth daily with breakfast.   Yes [provider]  fish oil-omega-3 fatty acids 1000 MG capsule Take 2 g by mouth daily.   Yes [provider]  Ginkgo Biloba Extract 60 MG CAPS Take by mouth.   Yes [provider]  glucose blood (ONE TOUCH ULTRA TEST) test strip 1 each by Other route daily. 12/01/14  Yes Jaynee Eagles, PA-C  lamoTRIgine (LAMICTAL) 200 MG tablet TAKE 1 TABLET BY MOUTH TWICE A DAY 11/23/21  Yes Hurst, Teresa T, PA-C  Lancets [facility] ULTRASOFT) lancets USE AS INSTRUCTED 11/28/13  Yes [name], MD  levothyroxine (SYNTHROID) 175 MCG tablet TAKE 1 TABLET BY MOUTH DAILY BEFORE BREAKFAST. 12/20/21  Yes [name], MD  olmesartan-hydrochlorothiazide (BENICAR HCT) 40-12.5 MG tablet Take 1 tablet by mouth every morning. 04/23/21  Yes Adrian Prows, MD  PARoxetine (PAXIL) 30 MG tablet TAKE 2 TABLETS BY MOUTH DAILY 11/23/21  Yes Addison Lank, PA-C   Social History   Socioeconomic History   Marital status: Married    Spouse name: [name]   Number of children: 3   Years of education: 11.5   Highest education level: Not on file  Occupational History    Employer: [employer]    Comment: Driver  Tobacco Use   Smoking status: Every Day    Packs/day: 1.50    Years: 37.00    Total pack years: 55.50    Types: Cigarettes    Start date: 04/02/2019   Smokeless tobacco: Never  Vaping Use   Vaping Use: Never used  Substance and Sexual Activity   Alcohol use: No    Comment: quit: 08/14/2011   Drug use: No   Sexual activity: Yes    Birth control/protection: None  Other Topics Concern   Not on file  Social History Narrative   Patient lives at home with family.    Caffeine Use: 1 pot of coffee daily   Social  Determinants of Health   Financial Resource Strain: Not on file  Food Insecurity: Not on file  Transportation Needs: Not on file  Physical Activity: Not on file  Stress: Not on file  Social Connections: Not on file  Intimate Partner Violence: Not on file    Review of Systems  Constitutional:  Negative for fatigue and unexpected weight change.  Eyes:  Negative for visual disturbance.  Respiratory:  Negative for cough, chest tightness and shortness of breath.   Cardiovascular:  Negative for chest pain, palpitations and leg swelling.  Gastrointestinal:  Negative for abdominal pain and blood in stool.  Neurological:  Negative for dizziness, light-headedness and headaches.     Objective:  Vitals:   02/25/22 1044  BP: 128/60  Pulse: (!) 57  Temp: 97.9 F (36.6 C)  SpO2: 97%  Weight: 222 lb (100.7 kg)  Height: 6' (1.829 m)     Physical Exam Vitals reviewed.  Constitutional:      Appearance: He is well-developed.  HENT:     Head: Normocephalic and atraumatic.  Neck:     Vascular: No carotid bruit or JVD.  Cardiovascular:     Rate and Rhythm: Normal rate and regular rhythm.     Heart sounds: Normal heart sounds. No murmur heard. Pulmonary:     Effort: Pulmonary effort is normal.     Breath sounds: Normal breath sounds. No rales.  Musculoskeletal:     Right lower leg: No edema.     Left lower leg: No edema.  Skin:    General: Skin is warm and dry.  Neurological:     Mental Status: He is alert and oriented to person, place, and time.  Psychiatric:        Mood and Affect: Mood normal.        Assessment & Plan:  Robert Wise is a 61 y.o. male . Type 2 diabetes mellitus with hyperglycemia, without long-term current use of insulin (HCC) - Plan: Dapagliflozin Pro-metFORMIN ER (XIGDUO XR) 07-998 MG TB24, Hemoglobin A1c  -Tolerating current med regimen, some elevated readings, check A1c and medication adjustments accordingly, consider adding GLP-1.  Need for  influenza vaccination - Plan: Flu Vaccine QUAD 6+ mos PF IM (Fluarix Quad PF)  Need for shingles vaccine - Plan: Varicella-zoster vaccine IM  Mixed hyperlipidemia - Plan: atorvastatin (LIPITOR) 80 MG tablet  -Tolerating current regimen, continue Lipitor same dose.  Cigarette nicotine dependence without complication  -Various methods discussed for cessation.  Discussed barriers to cessation and discussed using a list of pros and cons of smoking and for the benefits it has provided him to come up with alternatives when he does quit smoking to improve chance of success.  Handout given on steps to quit smoking as well as managing the challenges of quitting smoking.  Hypothyroidism, unspecified type - Plan: TSH  -Medically stable, check TSH with medication adjustment accordingly.  Meds ordered this encounter  Medications   atorvastatin (LIPITOR) 80 MG tablet    Sig: Take 1 tablet (80 mg total) by mouth daily.    Dispense:  90 tablet    Refill:  2   Dapagliflozin Pro-metFORMIN ER (XIGDUO XR) 07-998 MG TB24    Sig: Take 2 tablets by mouth daily.    Dispense:  180 tablet    Refill:  1   Patient Instructions  See info on quitting smoking below. That is one of the most important things you can do for your health. Good luck!  Improve diet should help blood sugar, but I will check your A1c and thyroid test today.  Depending on A1c level we do have the option of one of the injectable medicines for improved diabetes control which may also help manage weight.  Continue Xigduo twice per day for now. Follow-up in 3 months, unless concerns on labs.  Let me know if there are questions and take care!  Managing the Challenge of Quitting Smoking Quitting smoking is a physical and mental challenge. You may have cravings, withdrawal symptoms, and temptation to smoke. Before quitting, work with your health care provider to make a plan that can help you manage quitting. Making a plan before you quit may keep  you from smoking  when you have the urge to smoke while trying to quit. How to manage lifestyle changes Managing stress Stress can make you want to smoke, and wanting to smoke may cause stress. It is important to find ways to manage your stress. You could try some of the following: Practice relaxation techniques. Breathe slowly and deeply, in through your nose and out through your mouth. Listen to music. Soak in a bath or take a shower. Imagine a peaceful place or vacation. Get some support. Talk with family or friends about your stress. Join a support group. Talk with a counselor or therapist. Get some physical activity. Go for a walk, run, or bike ride. Play a favorite sport. Practice yoga.  Medicines Talk with your health care provider about medicines that might help you deal with cravings and make quitting easier for you. Relationships Social situations can be difficult when you are quitting smoking. To manage this, you can: Avoid parties and other social situations where people might be smoking. Avoid alcohol. Leave right away if you have the urge to smoke. Explain to your family and friends that you are quitting smoking. Ask for support and let them know you might be a bit grumpy. Plan activities where smoking is not an option. General instructions Be aware that many people gain weight after they quit smoking. However, not everyone does. To keep from gaining weight, have a plan in place before you quit, and stick to the plan after you quit. Your plan should include: Eating healthy snacks. When you have a craving, it may help to: Eat popcorn, or try carrots, celery, or other cut vegetables. Chew sugar-free gum. Changing how you eat. Eat small portion sizes at meals. Eat 4-6 small meals throughout the day instead of 1-2 large meals a day. Be mindful when you eat. You should avoid watching television or doing other things that might distract you as you eat. Exercising  regularly. Make time to exercise each day. If you do not have time for a long workout, do short bouts of exercise for 5-10 minutes several times a day. Do some form of strengthening exercise, such as weight lifting. Do some exercise that gets your heart beating and causes you to breathe deeply, such as walking fast, running, swimming, or biking. This is very important. Drinking plenty of water or other low-calorie or no-calorie drinks. Drink enough fluid to keep your urine pale yellow.  How to recognize withdrawal symptoms Your body and mind may experience discomfort as you try to get used to not having nicotine in your system. These effects are called withdrawal symptoms. They may include: Feeling hungrier than normal. Having trouble concentrating. Feeling irritable or restless. Having trouble sleeping. Feeling depressed. Craving a cigarette. These symptoms may surprise you, but they are normal to have when quitting smoking. To manage withdrawal symptoms: Avoid places, people, and activities that trigger your cravings. Remember why you want to quit. Get plenty of sleep. Avoid coffee and other drinks that contain caffeine. These may worsen some of your symptoms. How to manage cravings Come up with a plan for how to deal with your cravings. The plan should include the following: A definition of the specific situation you want to deal with. An activity or action you will take to replace smoking. A clear idea for how this action will help. The name of someone who could help you with this. Cravings usually last for 5-10 minutes. Consider taking the following actions to help you with your plan to deal  with cravings: Keep your mouth busy. Chew sugar-free gum. Suck on hard candies or a straw. Brush your teeth. Keep your hands and body busy. Change to a different activity right away. Squeeze or play with a ball. Do an activity or a hobby, such as making bead jewelry, practicing needlepoint,  or working with wood. Mix up your normal routine. Take a short exercise break. Go for a quick walk, or run up and down stairs. Focus on doing something kind or helpful for someone else. Call a friend or family member to talk during a craving. Join a support group. Contact a quitline. Where to find support To get help or find a support group: Call the [facility]'s Smoking Quitline: 1-800-QUIT-NOW 3167928413) Text QUIT to SmokefreeTXT: 416606 Where to find more information Visit these websites to find more information on quitting smoking: U.S. Department of Health and Human Services: www.smokefree.gov American Lung Association: www.freedomfromsmoking.org Centers for Disease Control and Prevention (CDC): http://www.wolf.info/ American Heart Association: www.heart.org Contact a health care provider if: You want to change your plan for quitting. The medicines you are taking are not helping. Your eating feels out of control or you cannot sleep. You feel depressed or become very anxious. Summary Quitting smoking is a physical and mental challenge. You will face cravings, withdrawal symptoms, and temptation to smoke again. Preparation can help you as you go through these challenges. Try different techniques to manage stress, handle social situations, and prevent weight gain. You can deal with cravings by keeping your mouth busy (such as by chewing gum), keeping your hands and body busy, calling family or friends, or contacting a quitline for people who want to quit smoking. You can deal with withdrawal symptoms by avoiding places where people smoke, getting plenty of rest, and avoiding drinks that contain caffeine. This information is not intended to replace advice given to you by your health care provider. Make sure you discuss any questions you have with your health care provider. Document Revised: 03/09/2021 Document Reviewed: 03/09/2021 Elsevier Patient Education  [place].   Steps to Quit Smoking Smoking tobacco is the leading cause of preventable death. It can affect almost every organ in the body. Smoking puts you and those around you at risk for developing many serious chronic diseases. Quitting smoking can be very challenging. Do not get discouraged if you are not successful the first time. Some people need to make many attempts to quit before they achieve long-term success. Do your best to stick to your quit plan, and talk with your health care provider if you have any questions or concerns. How do I get ready to quit? When you decide to quit smoking, create a plan to help you succeed. Before you quit: Pick a date to quit. Set a date within the next 2 weeks to give you time to prepare. Write down the reasons why you are quitting. Keep this list in places where you will see it often. Tell your family, friends, and co-workers that you are quitting. Support from people you are close to can make quitting easier. Talk with your health care provider about your options for quitting smoking. Find out what treatment options are covered by your health insurance. Identify people, places, things, and activities that make you want to smoke (triggers). Avoid them. What first steps can I take to quit smoking? Throw away all cigarettes at home, at work, and in your car. Throw away smoking accessories, such as Scientist, research (medical). Clean your car.  Make sure to empty the ashtray. Clean your home, including curtains and carpets. What strategies can I use to quit smoking? Talk with your health care provider about combining strategies, such as taking medicines while you are also receiving in-person counseling. Using these two strategies together makes you more likely to succeed in quitting than if you used either strategy on its own. If you are pregnant or breastfeeding, talk with your health care provider about finding counseling or other support strategies to quit  smoking. Do not take medicine to help you quit smoking unless your health care provider tells you to. Quit right away Quit smoking completely, instead of gradually reducing how much you smoke over a period of time. Stopping smoking right away may be more successful than gradually quitting. Attend in-person counseling to help you build problem-solving skills. You are more likely to succeed in quitting if you attend counseling sessions regularly. Even short sessions of 10 minutes can be effective. Take medicine You may take medicines to help you quit smoking. Some medicines require a prescription. You can also purchase over-the-counter medicines. Medicines may have nicotine in them to replace the nicotine in cigarettes. Medicines may: Help to stop cravings. Help to relieve withdrawal symptoms. Your health care provider may recommend: Nicotine patches, gum, or lozenges. Nicotine inhalers or sprays. Non-nicotine medicine that you take by mouth. Find resources Find resources and support systems that can help you quit smoking and remain smoke-free after you quit. These resources are most helpful when you use them often. They include: Online chats with a Social worker. Telephone quitlines. Printed Furniture conservator/restorer. Support groups or group counseling. Text messaging programs. Mobile phone apps or applications. Use apps that can help you stick to your quit plan by providing reminders, tips, and encouragement. Examples of free services include Quit Guide from the CDC and smokefree.gov  What can I do to make it easier to quit?  Reach out to your family and friends for support and encouragement. Call telephone quitlines, such as 1-800-QUIT-NOW, reach out to support groups, or work with a counselor for support. Ask people who smoke to avoid smoking around you. Avoid places that trigger you to smoke, such as bars, parties, or smoke-break areas at work. Spend time with people who do not smoke. Lessen the  stress in your life. Stress can be a smoking trigger for some people. To lessen stress, try: Exercising regularly. Doing deep-breathing exercises. Doing yoga. Meditating. What benefits will I see if I quit smoking? Over time, you should start to see positive results, such as: Improved sense of smell and taste. Decreased coughing and sore throat. Slower heart rate. Lower blood pressure. Clearer and healthier skin. The ability to breathe more easily. Fewer sick days. Summary Quitting smoking can be very challenging. Do not get discouraged if you are not successful the first time. Some people need to make many attempts to quit before they achieve long-term success. When you decide to quit smoking, create a plan to help you succeed. Quit smoking right away, not slowly over a period of time. Find resources and support systems that can help you quit smoking and remain smoke-free after you quit. This information is not intended to replace advice given to you by your health care provider. Make sure you discuss any questions you have with your health care provider. Document Revised: 03/09/2021 Document Reviewed: 03/09/2021 Elsevier Patient Education  2023 [place],   Merri Ray, MD [facility] 02/25/22 10:53 AM

## 2022-02-26 ENCOUNTER — Encounter: Payer: Self-pay | Admitting: Physician Assistant

## 2022-02-26 ENCOUNTER — Encounter: Payer: Self-pay | Admitting: Family Medicine

## 2022-02-26 ENCOUNTER — Ambulatory Visit (INDEPENDENT_AMBULATORY_CARE_PROVIDER_SITE_OTHER): Payer: BC Managed Care – PPO | Admitting: Physician Assistant

## 2022-02-26 DIAGNOSIS — G4733 Obstructive sleep apnea (adult) (pediatric): Secondary | ICD-10-CM | POA: Diagnosis not present

## 2022-02-26 DIAGNOSIS — G2581 Restless legs syndrome: Secondary | ICD-10-CM | POA: Diagnosis not present

## 2022-02-26 DIAGNOSIS — F3341 Major depressive disorder, recurrent, in partial remission: Secondary | ICD-10-CM | POA: Diagnosis not present

## 2022-02-26 DIAGNOSIS — F17200 Nicotine dependence, unspecified, uncomplicated: Secondary | ICD-10-CM

## 2022-02-26 DIAGNOSIS — F411 Generalized anxiety disorder: Secondary | ICD-10-CM | POA: Diagnosis not present

## 2022-02-26 LAB — TSH: TSH: 0.34 u[IU]/mL — ABNORMAL LOW (ref 0.35–5.50)

## 2022-02-26 LAB — HEMOGLOBIN A1C: Hgb A1c MFr Bld: 7.2 % — ABNORMAL HIGH (ref 4.6–6.5)

## 2022-02-26 NOTE — Progress Notes (Signed)
[facility]  Patient ID: Robert Wise,  MRN: [id number]  PCP: Robert Agreste, MD  Date of Evaluation: 02/26/2022 Time spent:30 minutes  Chief Complaint:  Chief Complaint   Anxiety; Depression; Insomnia; Follow-up    HISTORY/CURRENT STATUS: HPI For routine med check.  Robert Wise states he is doing much better.  Increasing the Valium dose at bedtime has been very helpful.  He is using his CPAP religiously as well therefore he is getting more sleep and it is more restful.  Not as tired when he wakes up.  He still gets up to go to the restroom once each night but is able to fall back asleep without any problems.  No complaints of panic attacks.  Sometimes he does get overwhelmed and feel an easy but there is usually a reason for it and those feelings go away fairly quickly.  Work is going well.  He had a DOT physical recently and then also saw his PCP yesterday and had labs drawn for routine testing.    Patient is able to enjoy things.  Energy and motivation are good.  No extreme sadness, tearfulness, or feelings of hopelessness.  ADLs and personal hygiene are normal.   Denies any changes in concentration, making decisions, or remembering things.  Appetite has not changed.  Weight is stable.   Denies suicidal or homicidal thoughts.  Patient denies increased energy with decreased need for sleep, increased talkativeness, racing thoughts, impulsivity or risky behaviors, increased spending, increased libido, grandiosity, increased irritability or anger, paranoia, or hallucinations.   Denies dizziness, syncope, seizures, numbness, tingling, tremor, tics, unsteady gait, falls, slurred speech, confusion. Denies muscle or joint pain, stiffness, or dystonia.  His PCP takes care of cholesterol and glucose.  Individual Medical History/ Review of Systems: Changes? 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 Use up to four times daily as directed. (FOR ICD-9 250.00, 250.01)., Disp: 1 each, Rfl: 0   Blood Glucose Monitoring Suppl (ONE TOUCH ULTRA SYSTEM KIT) W/DEVICE KIT, 1 kit by Does not apply route once., Disp: 1 each, Rfl: 0   busPIRone (BUSPAR) 30 MG tablet, TAKE 1 TABLET BY MOUTH 2 TIMES DAILY., Disp: 180 tablet, Rfl: 0   cholecalciferol (VITAMIN D3) 25 MCG (1000 UT) tablet, Take 2,000 Units by mouth daily., Disp: , Rfl:    Dapagliflozin Pro-metFORMIN ER (XIGDUO XR) 07-998 MG TB24, Take 2 tablets by mouth daily., Disp: 180 tablet, Rfl: 1   diazepam (VALIUM) 10 MG tablet, Take 10 mg by mouth at bedtime as needed for anxiety., Disp: , Rfl:    esomeprazole (NEXIUM) 40 MG capsule, Take 1 capsule (40 mg total) by mouth daily., Disp: 90 capsule, Rfl: 3  ferrous sulfate 325 (65 FE) MG tablet, Take 65 mg by mouth daily with breakfast., Disp: , Rfl:    fish oil-omega-3 fatty acids 1000 MG capsule, Take 2 g by mouth daily., Disp: , Rfl:    Ginkgo Biloba Extract 60 MG CAPS, Take by mouth., Disp: , Rfl:    glucose blood (ONE TOUCH ULTRA TEST) test strip, 1 each by Other route daily., Disp: 100 each, Rfl: 8   lamoTRIgine (LAMICTAL) 200 MG tablet, TAKE 1 TABLET BY MOUTH TWICE A DAY, Disp: 180  tablet, Rfl: 0   Lancets (ONETOUCH ULTRASOFT) lancets, USE AS INSTRUCTED, Disp: 100 each, Rfl: 0   levothyroxine (SYNTHROID) 175 MCG tablet, TAKE 1 TABLET BY MOUTH DAILY BEFORE BREAKFAST., Disp: 90 tablet, Rfl: 3   olmesartan-hydrochlorothiazide (BENICAR HCT) 40-12.5 MG tablet, Take 1 tablet by mouth every morning., Disp: 100 tablet, Rfl: 3   PARoxetine (PAXIL) 30 MG tablet, TAKE 2 TABLETS BY MOUTH EVERY DAY, Disp: 180 tablet, Rfl: 0 Medication Side Effects: none  Family Medical/ Social History: Changes? No  MENTAL HEALTH EXAM:  There were no vitals taken for this visit.There is no height or weight on file to calculate BMI.  General Appearance: Casual, Neat and Well Groomed  Eye Contact:  Good  Speech:  Clear and Coherent and Normal Rate  Volume:  Normal  Mood:  Euthymic  Affect:  Appropriate  Thought Process:  Goal Directed and Descriptions of Associations: Circumstantial  Orientation:  Full (Time, Place, and Person)  Thought Content: Logical   Suicidal Thoughts:  No  Homicidal Thoughts:  No  Memory:  WNL  Judgement:  Good  Insight:  Good  Psychomotor Activity:  Normal  Concentration:  Concentration: Good and Attention Span: Fair  Recall:  Good  Fund of Knowledge: Good  Language: Good  Assets:  Desire for Improvement  ADL's:  Intact  Cognition: WNL  Prognosis:  Good   Labs 02/25/2022 Hemoglobin A1c 7.2 TSH 0.34  02/05/2022 Glucose 110 otherwise normal BMP Lipid panel triglycerides 276, HDL 30, LDL 76 otherwise normal PCP follows labs.  DIAGNOSES:    ICD-10-CM   1. Recurrent major depressive disorder, in partial remission (HCC)  F33.41     2. Obstructive sleep apnea  G47.33     3. Generalized anxiety disorder  F41.1     4. Restless leg syndrome  G25.81     5. Smoker  F17.200       Receiving Psychotherapy: No   RECOMMENDATIONS:  PDMP was reviewed. Valium filled 02/11/2022. I provided 30 minutes of face to face time during this encounter, including time  spent before and after the visit in records review, medical decision making, counseling pertinent to today's visit, and charting.   Smoking cessation discussed. I am glad to do that he is doing better.  No change in treatment this time.  Continue BuSpar 30 mg, 1 p.o. twice daily.  Continue Valium 10 mg nightly as needed.        Continue Lamictal 200 mg, 1 p.o. twice daily. Continue Paxil 30 mg, 2 p.o. daily. Continue multivitamin, ginkgo biloba, B complex, fish oil, vitamin D. Continue using CPAP. Return in 4 months.     , PA-C  

## 2022-02-27 ENCOUNTER — Other Ambulatory Visit: Payer: Self-pay | Admitting: Family Medicine

## 2022-02-27 DIAGNOSIS — E039 Hypothyroidism, unspecified: Secondary | ICD-10-CM

## 2022-02-27 MED ORDER — LEVOTHYROXINE SODIUM 150 MCG PO TABS: 150.0000 ug | ORAL_TABLET | Freq: Every day | ORAL | 5 refills | Status: DC

## 2022-02-27 NOTE — Progress Notes (Signed)
See lab notes

## 2022-03-04 ENCOUNTER — Other Ambulatory Visit: Payer: Self-pay

## 2022-03-04 ENCOUNTER — Telehealth: Payer: Self-pay | Admitting: Physician Assistant

## 2022-03-04 MED ORDER — BUSPIRONE HCL 30 MG PO TABS: 30.0000 mg | ORAL_TABLET | Freq: Two times a day (BID) | ORAL | 0 refills | Status: DC

## 2022-03-04 MED ORDER — LAMOTRIGINE 200 MG PO TABS: 200.0000 mg | ORAL_TABLET | Freq: Two times a day (BID) | ORAL | 0 refills | Status: DC

## 2022-03-04 NOTE — Telephone Encounter (Signed)
Pt called at 1:47p.  He would like refill of Buspirone and Lamotrigine to   [id number] - [place], [place] - [address] [place], [place] [address] Phone: [phone]  Fax: [phone]    Next appt 4/1

## 2022-03-04 NOTE — Telephone Encounter (Signed)
Sent!

## 2022-03-11 NOTE — Progress Notes (Unsigned)
[facility] [place]  ASSESSMENT / PLAN: Robert Wise is a 61 y.o. male with asymptomatic bilateral carotid artery stenosis (R ~80% > L 25%).   The patient should continue best medical therapy for carotid artery stenosis including: Complete cessation from all tobacco products. Blood glucose control with goal A1c < 7%. Blood pressure control with goal blood pressure < 140/90 mmHg. Lipid reduction therapy with goal LDL-C <100 mg/dL (<70 if symptomatic from carotid artery stenosis).  Aspirin 60m PO QD.  Clopidogrel 765mPO QD. Atorvastatin 40-80mg PO QD (or other "high intensity" statin therapy).  CT angiogram shows suitable anatomy for either endarterectomy or TCAR.  I counseled the patient extensively about risks, benefits, and alternatives to various treatment strategies for asymptomatic critical carotid stenosis.  Ultimately, I recommended a TCAR for the patient.  He was quoted a approximately 1 to 2% risk of stroke, a 1% risk of cranial nerve injury, and a less than 5% risk of hematoma.  He and his wife would like some time to discuss the options.  I started him on Plavix today.  He is tentatively scheduled for TCAR on Wednesday, 03/20/2022.  I will call him on Thursday 03/14/2022 to follow-up his family discussion.  CHIEF COMPLAINT: asymptomatic carotid artery stenosis  HISTORY OF PRESENT ILLNESS: Robert Wise a [age].o. male surgery clinic by Dr. GaEinar Gipor evaluation of carotid artery stenosis.  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 No history of neck radiation. No history of neck surgery. No anticoagulation use.   VASCULAR SURGICAL HISTORY: none  VASCULAR RISK FACTORS: Negative history of stroke / transient ischemic attack. Negative history of coronary artery disease.   Positive history of diabetes mellitus. Last A1c 7.2. Positive history of smoking. + actively smoking. Positive history of hypertension.  Negative history of chronic kidney disease.   Negative history of chronic obstructive pulmonary disease.  FUNCTIONAL STATUS: ECOG performance status: (0) Fully active, able to carry on all predisease performance without restriction Ambulatory status: Ambulatory within the community without limits  CAREY 1 AND 3 YEAR INDEX Male (2pts) 75-79 or 80-84 (2pts) >84 (3pts) Dependence in toileting (1pt) Partial or full dependence in dressing (1pt) History of malignant neoplasm (2pts) CHF (3pts) COPD (1pts) CKD (3pts)  0-3 pts 6% 1 year mortality ; 21% 3 year mortality 4-5 pts 12% 1 year mortality ; 36% 3 year mortality >5 pts 21% 1 year mortality; 54% 3 year mortality   Past Medical History:  Diagnosis Date   Anxiety    Depression    Diabetes mellitus without complication (HCC)    Hyperlipidemia    OSA (obstructive sleep apnea) 12/15/2012   Sleep apnea     Past Surgical History:  Procedure Laterality Date   ANKLE SURGERY Left 04/01/2004   CATARACT EXTRACTION Right 04/2021   VASECTOMY      Family History  Problem Relation Age of Onset   Cancer Mother    Heart failure Sister    Sleep apnea Neg Hx     Social History   Socioeconomic History   Marital status: Married    Spouse name: ChAlyse Low Number of children: 3   Years of education: 11.5   Highest education level: Not on file  Occupational History    Employer: [employer]  Comment: Driver  Tobacco Use   Smoking status: Every Day    Packs/day: 1.50    Years: 37.00    Total pack years: 55.50    Types: Cigarettes    Start date: 04/02/2019   Smokeless tobacco: Never  Vaping Use   Vaping Use: Never used  Substance and Sexual Activity   Alcohol use: No    Comment: quit: 08/14/2011   Drug use: No   Sexual activity: Yes    Birth control/protection: None  Other Topics  Concern   Not on file  Social History Narrative   Patient lives at home with family.    Caffeine Use: 1 pot of coffee daily   Social Determinants of Health   Financial Resource Strain: Not on file  Food Insecurity: Not on file  Transportation Needs: Not on file  Physical Activity: Not on file  Stress: Not on file  Social Connections: Not on file  Intimate Partner Violence: Not on file    Allergies  Allergen Reactions   Tricor [Fenofibrate]    Zocor [Simvastatin]     Current Outpatient Medications  Medication Sig Dispense Refill   aspirin (ASPIRIN CHILDRENS) 81 MG chewable tablet Chew 1 tablet (81 mg total) by mouth daily.     atorvastatin (LIPITOR) 80 MG tablet Take 1 tablet (80 mg total) by mouth daily. 90 tablet 2   B Complex Vitamins (B-COMPLEX/B-12 PO) Take by mouth.     blood glucose meter kit and supplies KIT Dispense based on patient and insurance preference. Use up to four times daily as directed. (FOR ICD-9 250.00, 250.01). 1 each 0   Blood Glucose Monitoring Suppl (ONE TOUCH ULTRA SYSTEM KIT) W/DEVICE KIT 1 kit by Does not apply route once. 1 each 0   busPIRone (BUSPAR) 30 MG tablet Take 1 tablet (30 mg total) by mouth 2 (two) times daily. 180 tablet 0   cholecalciferol (VITAMIN D3) 25 MCG (1000 UT) tablet Take 2,000 Units by mouth daily.     Dapagliflozin Pro-metFORMIN ER (XIGDUO XR) 07-998 MG TB24 Take 2 tablets by mouth daily. 180 tablet 1   diazepam (VALIUM) 10 MG tablet Take 10 mg by mouth at bedtime as needed for anxiety.     esomeprazole (NEXIUM) 40 MG capsule Take 1 capsule (40 mg total) by mouth daily. 90 capsule 3   ferrous sulfate 325 (65 FE) MG tablet Take 65 mg by mouth daily with breakfast.     fish oil-omega-3 fatty acids 1000 MG capsule Take 2 g by mouth daily.     Ginkgo Biloba Extract 60 MG CAPS Take by mouth.     glucose blood (ONE TOUCH ULTRA TEST) test strip 1 each by Other route daily. 100 each 8   lamoTRIgine (LAMICTAL) 200 MG tablet Take 1  tablet (200 mg total) by mouth 2 (two) times daily. 180 tablet 0   Lancets (ONETOUCH ULTRASOFT) lancets USE AS INSTRUCTED 100 each 0   levothyroxine (SYNTHROID) 150 MCG tablet Take 1 tablet (150 mcg total) by mouth daily before breakfast. 30 tablet 5   olmesartan-hydrochlorothiazide (BENICAR HCT) 40-12.5 MG tablet Take 1 tablet by mouth every morning. 100 tablet 3   PARoxetine (PAXIL) 30 MG tablet TAKE 2 TABLETS BY MOUTH EVERY DAY 180 tablet 0   No current facility-administered medications for this visit.    PHYSICAL EXAM Vitals:   03/12/22 0828 03/12/22 0831  BP: 116/63 (!) 105/57  Pulse: 65   Resp: 20   Temp: 99 F (37.2 C)  SpO2: 94%   Weight: 219 lb (99.3 kg)   Height: 6' (1.829 m)     Well appearing man in no distress Regular rate and rhythm Unlabored breathing No focal neurologic signs.    PERTINENT LABORATORY AND RADIOLOGIC DATA  Most recent CBC    Latest Ref Rng & Units 06/02/2017    4:59 PM 07/28/2015    8:50 AM 02/09/2014    6:34 PM  CBC  WBC 3.4 - 10.8 x10E3/uL 9.8  8.6  8.7   Hemoglobin 13.0 - 17.7 g/dL 14.7  15.5  14.5   Hematocrit 37.5 - 51.0 % 42.6  43.0  43.7   Platelets 150 - 379 x10E3/uL 253        Most recent CMP    Latest Ref Rng & Units 02/05/2022    8:56 AM 07/02/2021   12:52 PM 01/16/2021    2:30 PM  CMP  Glucose 70 - 99 mg/dL 110  201  131   BUN 8 - 27 mg/dL _0 Creatinine 0.76 - 1.27 mg/dL 1.14  0.90  1.08   Sodium 134 - 144 mmol/L 139  137  138   Potassium 3.5 - 5.2 mmol/L 5.0  4.6  4.6   Chloride 96 - 106 mmol/L 103  101  99   CO2 20 - 29 mmol/L _1 Calcium 8.6 - 10.2 mg/dL 9.7  9.8  9.5   Total Protein 6.0 - 8.3 g/dL  6.9    Total Bilirubin 0.2 - 1.2 mg/dL  0.5    Alkaline Phos 39 - 117 U/L  79    AST 0 - 37 U/L  18    ALT 0 - 53 U/L  30      Renal function CrCl cannot be calculated (Patient's most recent lab result is older than the maximum 21 days allowed.).  Hgb A1c MFr Bld (%)  Date Value  02/25/2022  7.2 (H)    LDL Chol Calc (NIH)  Date Value Ref Range Status  02/05/2022 76 0 - 99 mg/dL Final   Direct LDL  Date Value Ref Range Status  07/02/2021 80.0 mg/dL Final    Comment:    Optimal:  <100 mg/dLNear or Above Optimal:  100-129 mg/dLBorderline High:  130-159 mg/dLHigh:  160-189 mg/dLVery High:  >190 mg/dL     Vascular Imaging: CT angiogram personally reviewed.  Severe right-sided carotid artery stenosis.  Mild left-sided carotid artery stenosis.  Appears to be candidate for either endarterectomy or TCAR.  Yevonne Aline. Stanford Breed, MD FACS [facility] Phone Number: [phone] 03/11/2022 9:48 AM   Total time spent on preparing this encounter including chart review, data review, collecting history, examining the patient, coordinating care for this new patient, 60 minutes.  Portions of this report may have been transcribed using voice recognition software.  Every effort has been made to ensure accuracy; however, inadvertent computerized transcription errors may still be present.

## 2022-03-12 ENCOUNTER — Ambulatory Visit: Payer: BC Managed Care – PPO | Admitting: Vascular Surgery

## 2022-03-12 ENCOUNTER — Encounter: Payer: Self-pay | Admitting: Vascular Surgery

## 2022-03-12 VITALS — BP 105/57 | HR 65 | Temp 99.0°F | Resp 20 | Ht 72.0 in | Wt 219.0 lb

## 2022-03-12 DIAGNOSIS — I6523 Occlusion and stenosis of bilateral carotid arteries: Secondary | ICD-10-CM

## 2022-03-12 MED ORDER — CLOPIDOGREL BISULFATE 75 MG PO TABS: 75.0000 mg | ORAL_TABLET | Freq: Every day | ORAL | 6 refills | Status: DC

## 2022-03-13 ENCOUNTER — Telehealth: Payer: Self-pay

## 2022-03-13 NOTE — Telephone Encounter (Signed)
Patient was to be scheduled for right TCAR on 03/20/22. Spoke with patient's wife, who informed that patient would like to wait until the beginning of the year to have surgery. Offered to schedule for Jan. She stated, it would be best for patient to call our office back after January 1st to schedule. Dr. Lenell Antu made aware.

## 2022-03-14 ENCOUNTER — Ambulatory Visit (INDEPENDENT_AMBULATORY_CARE_PROVIDER_SITE_OTHER): Payer: BC Managed Care – PPO | Admitting: Vascular Surgery

## 2022-03-14 DIAGNOSIS — I6523 Occlusion and stenosis of bilateral carotid arteries: Secondary | ICD-10-CM

## 2022-03-14 NOTE — Progress Notes (Signed)
Discussed with patient. Plan to do TCAR early January 2024.   Rande Brunt. Lenell Antu, MD [facility] Phone Number: [phone] 03/14/2022 12:22 PM

## 2022-03-20 ENCOUNTER — Inpatient Hospital Stay: Admit: 2022-03-20 | Payer: BC Managed Care – PPO | Admitting: Vascular Surgery

## 2022-03-20 SURGERY — TRANSCAROTID ARTERY REVASCULARIZATION (TCAR)
Anesthesia: General | Laterality: Right

## 2022-03-21 ENCOUNTER — Other Ambulatory Visit: Payer: Self-pay | Admitting: Physician Assistant

## 2022-03-21 ENCOUNTER — Other Ambulatory Visit: Payer: Self-pay | Admitting: Cardiology

## 2022-03-21 DIAGNOSIS — F17200 Nicotine dependence, unspecified, uncomplicated: Secondary | ICD-10-CM

## 2022-03-21 DIAGNOSIS — I10 Essential (primary) hypertension: Secondary | ICD-10-CM

## 2022-04-02 ENCOUNTER — Telehealth: Payer: Self-pay | Admitting: Physician Assistant

## 2022-04-02 NOTE — Telephone Encounter (Signed)
Please advise 

## 2022-04-02 NOTE — Telephone Encounter (Signed)
Patient called in stating this he has appt this month to get a stent put in. He was told by physician to take Cholecalciferol and he read online that it may effect his Paxil medication. He needs rtc to discuss [phone]

## 2022-04-02 NOTE — Telephone Encounter (Signed)
Its ok to take both

## 2022-04-03 ENCOUNTER — Other Ambulatory Visit: Payer: Self-pay

## 2022-04-03 DIAGNOSIS — I6523 Occlusion and stenosis of bilateral carotid arteries: Secondary | ICD-10-CM

## 2022-04-03 NOTE — Telephone Encounter (Signed)
Pt informed

## 2022-04-03 NOTE — Telephone Encounter (Signed)
Patient called requesting to schedule right TCAR. Offered patient next available date of 04/10/22, but patient declined. Scheduled for 05/02/22. Instructions reviewed- patient verbalized understanding. Instructions letter will also be sent via [place].

## 2022-04-22 DIAGNOSIS — H40033 Anatomical narrow angle, bilateral: Secondary | ICD-10-CM | POA: Diagnosis not present

## 2022-04-22 DIAGNOSIS — E119 Type 2 diabetes mellitus without complications: Secondary | ICD-10-CM | POA: Diagnosis not present

## 2022-04-26 NOTE — Progress Notes (Signed)
Surgical Instructions    Your procedure is scheduled on Thursday, 05/02/2022.  Report to [facility] Main Entrance "A" at 5:30 A.M., then check in with the Admitting office.  Call this number if you have problems the morning of surgery:  [phone]   If you have any questions prior to your surgery date call [phone]: Open Monday-Friday 8am-4pm If you experience any cold or flu symptoms such as cough, fever, chills, shortness of breath, etc. between now and your scheduled surgery, please notify us at the above number     Remember:  Do not eat or drink after midnight the night before your surgery  Take these medicines the morning of surgery with A SIP OF WATER:  aspirin (ASPIRIN CHILDRENS)  clopidogrel (PLAVIX)  busPIRone (BUSPAR)  esomeprazole (NEXIUM)  lamoTRIgine (LAMICTAL)  levothyroxine (SYNTHROID)  PARoxetine (PAXIL)   As of today, STOP taking any Aleve, Naproxen, Ibuprofen, Motrin, Advil, Goody's, BC's, all herbal medications, fish oil, and all vitamins.   WHAT DO I DO ABOUT MY DIABETES MEDICATION?   Do not take oral diabetes medicine, Dapagliflozin Pro-Metformin (Xigduo XR) for 72 hours prior to surgery. Your last dose will be 04/28/2022.    The day of surgery, do not take other diabetes injectables, including Byetta (exenatide), Bydureon (exenatide ER), Victoza (liraglutide), or Trulicity (dulaglutide).  If your CBG is greater than 220 mg/dL, you may take  of your sliding scale (correction) dose of insulin.   HOW TO MANAGE YOUR DIABETES BEFORE AND AFTER SURGERY  Why is it important to control my blood sugar before and after surgery? Improving blood sugar levels before and after surgery helps healing and can limit problems. A way of improving blood sugar control is eating a healthy diet by:  Eating less sugar and carbohydrates  Increasing activity/exercise  Talking with your doctor about reaching your blood sugar goals High blood sugars (greater than 180 mg/dL)  can raise your risk of infections and slow your recovery, so you will need to focus on controlling your diabetes during the weeks before surgery. Make sure that the doctor who takes care of your diabetes knows about your planned surgery including the date and location.  How do I manage my blood sugar before surgery? Check your blood sugar at least 4 times a day, starting 2 days before surgery, to make sure that the level is not too high or low.  Check your blood sugar the morning of your surgery when you wake up and every 2 hours until you get to the Short Stay unit.  If your blood sugar is less than 70 mg/dL, you will need to treat for low blood sugar: Do not take insulin. Treat a low blood sugar (less than 70 mg/dL) with  cup of clear juice (cranberry or apple), 4 glucose tablets, OR glucose gel. Recheck blood sugar in 15 minutes after treatment (to make sure it is greater than 70 mg/dL). If your blood sugar is not greater than 70 mg/dL on recheck, call [phone] for further instructions. Report your blood sugar to the short stay nurse when you get to Short Stay.  If you are admitted to the hospital after surgery: Your blood sugar will be checked by the staff and you will probably be given insulin after surgery (instead of oral diabetes medicines) to make sure you have good blood sugar levels. The goal for blood sugar control after surgery is 80-180 mg/dL.            Do not wear jewelry or  makeup. Do not wear lotions, powders, cologne or deodorant. Do not shave 48 hours prior to surgery. Do not bring valuables to the hospital. Do not wear nail polish, gel polish, artificial nails, or any other type of covering on natural nails (fingers and toes) If you have artificial nails or gel coating that need to be removed by a nail salon, please have this removed prior to surgery. Artificial nails or gel coating may interfere with anesthesia's ability to adequately monitor your vital  signs.  [facility] is not responsible for any belongings or valuables.    Do NOT Smoke (Tobacco/Vaping)  24 hours prior to your procedure  If you use a CPAP at night, you may bring your mask for your overnight stay.   Contacts, glasses, hearing aids, dentures or partials may not be worn into surgery, please bring cases for these belongings   For patients admitted to the hospital, discharge time will be determined by your treatment team.   Patients discharged the day of surgery will not be allowed to drive home, and someone needs to stay with them for 24 hours.   SURGICAL WAITING ROOM VISITATION Patients having surgery or a procedure may have no more than 2 support people in the waiting area - these visitors may rotate.   Children under the age of 18 must have an adult with them who is not the patient. If the patient needs to stay at the hospital during part of their recovery, the visitor guidelines for inpatient rooms apply. Pre-op nurse will coordinate an appropriate time for 1 support person to accompany patient in pre-op.  This support person may not rotate.   Please refer to [email or web address] for the visitor guidelines for Inpatients (after your surgery is over and you are in a regular room).    Special instructions:    Oral Hygiene is also important to reduce your risk of infection.  Remember - BRUSH YOUR TEETH THE MORNING OF SURGERY WITH YOUR REGULAR TOOTHPASTE   [facility]- Preparing For Surgery  Before surgery, you can play an important role. Because skin is not sterile, your skin needs to be as free of germs as possible. You can reduce the number of germs on your skin by washing with CHG (chlorahexidine gluconate) Soap before surgery.  CHG is an antiseptic cleaner which kills germs and bonds with the skin to continue killing germs even after washing.     Please do not use if you have an allergy to CHG or antibacterial  soaps. If your skin becomes reddened/irritated stop using the CHG.  Do not shave (including legs and underarms) for at least 48 hours prior to first CHG shower. It is OK to shave your face.  Please follow these instructions carefully.     Shower the NIGHT BEFORE SURGERY and the MORNING OF SURGERY with CHG Soap.   If you chose to wash your hair, wash your hair first as usual with your normal shampoo. After you shampoo, rinse your hair and body thoroughly to remove the shampoo.  Then [employer] and genitals (private parts) with your normal soap and rinse thoroughly to remove soap.  After that Use CHG Soap as you would any other liquid soap. You can apply CHG directly to the skin and wash gently with a scrungie or a clean washcloth.   Apply the CHG Soap to your body ONLY FROM THE NECK DOWN.  Do not use on open wounds or open sores. Avoid contact with your eyes, ears, mouth  and genitals (private parts). Wash Face and genitals (private parts)  with your normal soap.   Wash thoroughly, paying special attention to the area where your surgery will be performed.  Thoroughly rinse your body with warm water from the neck down.  DO NOT shower/wash with your normal soap after using and rinsing off the CHG Soap.  Pat yourself dry with a CLEAN TOWEL.  Wear CLEAN PAJAMAS to bed the night before surgery  Place CLEAN SHEETS on your bed the night before your surgery  DO NOT SLEEP WITH PETS.   Day of Surgery:  Take a shower with CHG soap. Wear Clean/Comfortable clothing the morning of surgery Do not apply any deodorants/lotions.   Remember to brush your teeth WITH YOUR REGULAR TOOTHPASTE.    If you received a COVID test during your pre-op visit, it is requested that you wear a mask when out in public, stay away from anyone that may not be feeling well, and notify your surgeon if you develop symptoms. If you have been in contact with anyone that has tested positive in the last 10 days, please notify  your surgeon.    Please read over the following fact sheets that you were given.

## 2022-04-26 NOTE — Progress Notes (Signed)
Surgical Instructions    Your procedure is scheduled on Thursday, 05/02/2022.  Report to [facility] Main Entrance "A" at 5:30 A.M., then check in with the Admitting office.  Call this number if you have problems the morning of surgery:  [phone]   If you have any questions prior to your surgery date call [phone]: Open Monday-Friday 8am-4pm If you experience any cold or flu symptoms such as cough, fever, chills, shortness of breath, etc. between now and your scheduled surgery, please notify us at the above number     Remember:  Do not eat or drink after midnight the night before your surgery  Take these medicines the morning of surgery with A SIP OF WATER:  aspirin (ASPIRIN CHILDRENS)  atorvastatin (LIPITOR)  clopidogrel (PLAVIX)  busPIRone (BUSPAR)  esomeprazole (NEXIUM)  lamoTRIgine (LAMICTAL)  levothyroxine (SYNTHROID)  PARoxetine (PAXIL)   As of today, STOP taking any Aleve, Naproxen, Ibuprofen, Motrin, Advil, Goody's, BC's, all herbal medications, fish oil, and all vitamins.   WHAT DO I DO ABOUT MY DIABETES MEDICATION?   Do not take oral diabetes medicine, Dapagliflozin Pro-Metformin (Xigduo XR) for 72 hours prior to surgery. Your last dose will be 04/28/2022.    The day of surgery, do not take other diabetes injectables, including Byetta (exenatide), Bydureon (exenatide ER), Victoza (liraglutide), or Trulicity (dulaglutide).  If your CBG is greater than 220 mg/dL, you may take  of your sliding scale (correction) dose of insulin.   HOW TO MANAGE YOUR DIABETES BEFORE AND AFTER SURGERY  Why is it important to control my blood sugar before and after surgery? Improving blood sugar levels before and after surgery helps healing and can limit problems. A way of improving blood sugar control is eating a healthy diet by:  Eating less sugar and carbohydrates  Increasing activity/exercise  Talking with your doctor about reaching your blood sugar goals High blood sugars  (greater than 180 mg/dL) can raise your risk of infections and slow your recovery, so you will need to focus on controlling your diabetes during the weeks before surgery. Make sure that the doctor who takes care of your diabetes knows about your planned surgery including the date and location.  How do I manage my blood sugar before surgery? Check your blood sugar at least 4 times a day, starting 2 days before surgery, to make sure that the level is not too high or low.  Check your blood sugar the morning of your surgery when you wake up and every 2 hours until you get to the Short Stay unit.  If your blood sugar is less than 70 mg/dL, you will need to treat for low blood sugar: Do not take insulin. Treat a low blood sugar (less than 70 mg/dL) with  cup of clear juice (cranberry or apple), 4 glucose tablets, OR glucose gel. Recheck blood sugar in 15 minutes after treatment (to make sure it is greater than 70 mg/dL). If your blood sugar is not greater than 70 mg/dL on recheck, call [phone] for further instructions. Report your blood sugar to the short stay nurse when you get to Short Stay.  If you are admitted to the hospital after surgery: Your blood sugar will be checked by the staff and you will probably be given insulin after surgery (instead of oral diabetes medicines) to make sure you have good blood sugar levels. The goal for blood sugar control after surgery is 80-180 mg/dL.            Do not  wear jewelry or makeup. Do not wear lotions, powders, cologne or deodorant. Do not shave 48 hours prior to surgery. Do not bring valuables to the hospital. Do not wear nail polish, gel polish, artificial nails, or any other type of covering on natural nails (fingers and toes) If you have artificial nails or gel coating that need to be removed by a nail salon, please have this removed prior to surgery. Artificial nails or gel coating may interfere with anesthesia's ability to adequately  monitor your vital signs.  [facility] is not responsible for any belongings or valuables.    Do NOT Smoke (Tobacco/Vaping)  24 hours prior to your procedure  If you use a CPAP at night, you may bring your mask for your overnight stay.   Contacts, glasses, hearing aids, dentures or partials may not be worn into surgery, please bring cases for these belongings   For patients admitted to the hospital, discharge time will be determined by your treatment team.   Patients discharged the day of surgery will not be allowed to drive home, and someone needs to stay with them for 24 hours.   SURGICAL WAITING ROOM VISITATION Patients having surgery or a procedure may have no more than 2 support people in the waiting area - these visitors may rotate.   Children under the age of 51 must have an adult with them who is not the patient. If the patient needs to stay at the hospital during part of their recovery, the visitor guidelines for inpatient rooms apply. Pre-op nurse will coordinate an appropriate time for 1 support person to accompany patient in pre-op.  This support person may not rotate.   Please refer to [email or web address] for the visitor guidelines for Inpatients (after your surgery is over and you are in a regular room).    Special instructions:    Oral Hygiene is also important to reduce your risk of infection.  Remember - BRUSH YOUR TEETH THE MORNING OF SURGERY WITH YOUR REGULAR TOOTHPASTE   [place]- Preparing For Surgery  Before surgery, you can play an important role. Because skin is not sterile, your skin needs to be as free of germs as possible. You can reduce the number of germs on your skin by washing with CHG (chlorahexidine gluconate) Soap before surgery.  CHG is an antiseptic cleaner which kills germs and bonds with the skin to continue killing germs even after washing.     Please do not use if you have an allergy to CHG  or antibacterial soaps. If your skin becomes reddened/irritated stop using the CHG.  Do not shave (including legs and underarms) for at least 48 hours prior to first CHG shower. It is OK to shave your face.  Please follow these instructions carefully.     Shower the NIGHT BEFORE SURGERY and the MORNING OF SURGERY with CHG Soap.   If you chose to wash your hair, wash your hair first as usual with your normal shampoo. After you shampoo, rinse your hair and body thoroughly to remove the shampoo.  Then [employer] and genitals (private parts) with your normal soap and rinse thoroughly to remove soap.  After that Use CHG Soap as you would any other liquid soap. You can apply CHG directly to the skin and wash gently with a scrungie or a clean washcloth.   Apply the CHG Soap to your body ONLY FROM THE NECK DOWN.  Do not use on open wounds or open sores. Avoid contact with your  eyes, ears, mouth and genitals (private parts). Wash Face and genitals (private parts)  with your normal soap.   Wash thoroughly, paying special attention to the area where your surgery will be performed.  Thoroughly rinse your body with warm water from the neck down.  DO NOT shower/wash with your normal soap after using and rinsing off the CHG Soap.  Pat yourself dry with a CLEAN TOWEL.  Wear CLEAN PAJAMAS to bed the night before surgery  Place CLEAN SHEETS on your bed the night before your surgery  DO NOT SLEEP WITH PETS.   Day of Surgery: Take a shower with CHG soap. Wear Clean/Comfortable clothing the morning of surgery Do not apply any deodorants/lotions.   Remember to brush your teeth WITH YOUR REGULAR TOOTHPASTE.    If you received a COVID test during your pre-op visit, it is requested that you wear a mask when out in public, stay away from anyone that may not be feeling well, and notify your surgeon if you develop symptoms. If you have been in contact with anyone that has tested positive in the last 10 days,  please notify your surgeon.    Please read over the following fact sheets that you were given.

## 2022-04-29 ENCOUNTER — Encounter (HOSPITAL_COMMUNITY): Payer: Self-pay

## 2022-04-29 ENCOUNTER — Other Ambulatory Visit: Payer: Self-pay

## 2022-04-29 ENCOUNTER — Encounter (HOSPITAL_COMMUNITY)
Admission: RE | Admit: 2022-04-29 | Discharge: 2022-04-29 | Disposition: A | Discharge status: Home / Self Care | Payer: BC Managed Care – PPO | Source: Ambulatory Visit | Attending: Vascular Surgery | Admitting: Vascular Surgery

## 2022-04-29 VITALS — BP 144/62 | HR 58 | Temp 97.5°F | Resp 18 | Ht 72.0 in | Wt 223.5 lb

## 2022-04-29 DIAGNOSIS — E119 Type 2 diabetes mellitus without complications: Secondary | ICD-10-CM | POA: Diagnosis not present

## 2022-04-29 DIAGNOSIS — Z01812 Encounter for preprocedural laboratory examination: Secondary | ICD-10-CM | POA: Insufficient documentation

## 2022-04-29 DIAGNOSIS — I6523 Occlusion and stenosis of bilateral carotid arteries: Secondary | ICD-10-CM | POA: Insufficient documentation

## 2022-04-29 DIAGNOSIS — Z01818 Encounter for other preprocedural examination: Secondary | ICD-10-CM

## 2022-04-29 HISTORY — DX: Hypothyroidism, unspecified: E03.9

## 2022-04-29 HISTORY — DX: Gastro-esophageal reflux disease without esophagitis: K21.9

## 2022-04-29 HISTORY — DX: Essential (primary) hypertension: I10

## 2022-04-29 LAB — COMPREHENSIVE METABOLIC PANEL
ALT: 275 U/L — ABNORMAL HIGH (ref 0–44)
AST: 87 U/L — ABNORMAL HIGH (ref 15–41)
Albumin: 4 g/dL (ref 3.5–5.0)
Alkaline Phosphatase: 106 U/L (ref 38–126)
Anion gap: 10 (ref 5–15)
BUN: 15 mg/dL (ref 8–23)
CO2: 25 mmol/L (ref 22–32)
Calcium: 9 mg/dL (ref 8.9–10.3)
Chloride: 100 mmol/L (ref 98–111)
Creatinine, Ser: 0.98 mg/dL (ref 0.61–1.24)
GFR, Estimated: >60 mL/min (ref >60)
Glucose, Bld: 239 mg/dL — ABNORMAL HIGH (ref 70–99)
Potassium: 5 mmol/L (ref 3.5–5.1)
Sodium: 135 mmol/L (ref 135–145)
Total Bilirubin: 0.6 mg/dL (ref 0.3–1.2)
Total Protein: 7.1 g/dL (ref 6.5–8.1)

## 2022-04-29 LAB — CBC
HCT: 42.8 % (ref 39.0–52.0)
Hemoglobin: 14 g/dL (ref 13.0–17.0)
MCH: 29.5 pg (ref 26.0–34.0)
MCHC: 32.7 g/dL (ref 30.0–36.0)
MCV: 90.1 fL (ref 80.0–100.0)
Platelets: 222 10*3/uL (ref 150–400)
RBC: 4.75 MIL/uL (ref 4.22–5.81)
RDW: 15.1 % (ref 11.5–15.5)
WBC: 5.6 10*3/uL (ref 4.0–10.5)
nRBC: 0 % (ref 0.0–0.2)

## 2022-04-29 LAB — PROTIME-INR
INR: 1 (ref 0.8–1.2)
Prothrombin Time: 13.5 seconds (ref 11.4–15.2)

## 2022-04-29 LAB — URINALYSIS, ROUTINE W REFLEX MICROSCOPIC
Bacteria, UA: NONE SEEN
Bilirubin Urine: NEGATIVE
Glucose, UA: >=500 mg/dL — AB
Hgb urine dipstick: NEGATIVE
Ketones, ur: NEGATIVE mg/dL
Leukocytes,Ua: NEGATIVE
Nitrite: NEGATIVE
Protein, ur: NEGATIVE mg/dL
Specific Gravity, Urine: 1.024 (ref 1.005–1.030)
pH: 6 (ref 5.0–8.0)

## 2022-04-29 LAB — HEMOGLOBIN A1C
Hgb A1c MFr Bld: 6.6 % — ABNORMAL HIGH (ref 4.8–5.6)
Mean Plasma Glucose: 142.72 mg/dL

## 2022-04-29 LAB — TYPE AND SCREEN
ABO/RH(D): A POS
Antibody Screen: NEGATIVE

## 2022-04-29 LAB — SURGICAL PCR SCREEN
MRSA, PCR: POSITIVE — AB
Staphylococcus aureus: POSITIVE — AB

## 2022-04-29 LAB — APTT: aPTT: 37 seconds — ABNORMAL HIGH (ref 24–36)

## 2022-04-29 LAB — GLUCOSE, CAPILLARY: Glucose-Capillary: 261 mg/dL — ABNORMAL HIGH (ref 70–99)

## 2022-04-29 NOTE — Progress Notes (Signed)
MRSA and MSSA + at PAT. Herma Ard, RN at surgeons office notified. She will make surgeon aware.

## 2022-04-29 NOTE — Progress Notes (Signed)
PCP - Dr. Merri Ray Cardiologist - Dr. Adrian Prows  PPM/ICD - denies   Chest x-ray - 05/28/21 EKG - 02/19/22 Stress Test - 12/19/20 ECHO - 12/27/20 Cardiac Cath - denies  Sleep Study - 11/03/19, OSA+ CPAP - nightly  DM- Type 2 Fasting Blood Sugar - 150-160 Checks Blood Sugar once a day  Last dose of GLP1 agonist-  n/a   ASA/Blood Thinner Instructions: Continue ASA and Plavix, including morning of surgery   ERAS Protcol - no, NPO   COVID TEST- n/a   Anesthesia review: yes, has seen cardiology. CBG 261 at PAT  Patient denies shortness of breath, fever, cough and chest pain at PAT appointment   All instructions explained to the patient, with a verbal understanding of the material. Patient agrees to go over the instructions while at home for a better understanding. The opportunity to ask questions was provided.

## 2022-04-30 ENCOUNTER — Telehealth: Payer: Self-pay

## 2022-04-30 ENCOUNTER — Telehealth: Payer: Self-pay | Admitting: Family Medicine

## 2022-04-30 NOTE — Progress Notes (Signed)
Anesthesia Chart Review:  Follows with cardiologist Dr. Einar Gip for history of hyperlipidemia, hypertension, OSA on CPAP, asymptomatic carotid disease.  Nuclear stress 11/2020 was low risk.  Echo 11/2020 showed EF 60%, grade 1 DD, mild aortic regurgitation, mild mitral regurgitation.  Recent CTA of the head and neck showed high-grade stenosis of the right proximal ICA and patient was referred to vascular surgery for further evaluation.  Seen by Dr. Stanford Breed and TCAR recommended.  Non-insulin-dependent DM2, A1c 6.6 on preop labs.  Preop labs reviewed, notable for elevated transaminases, AST 87 and ALT 275.  These appear to be new elevations. Pt case currently being postponed due to insurance issue. I will reach out to his PCP Dr. Paulene Floor regarding elevated LFTs.  EKG 02/19/22: Normal sinus rhythm at rate of 63 bpm.  Normal axis.  Borderline left atrial enlargement.  Anterior lateral ST elevation, early repolarization variant.  CT angiogram of the head and neck 02/07/2022: 1. Negative CT head. 2. 80% diameter stenosis proximal right internal carotid artery due to calcified plaque. 3. 25% diameter stenosis proximal left internal carotid artery due to calcified plaque. 4. Mild stenosis cavernous carotid bilaterally due to atherosclerotic disease. 5. No intracranial large vessel occlusion or significant stenosis. 6. Aortic atherosclerosis.  MYOCARDIAL PERFUSION WO LEXISCAN 12/19/2020  1 Day Rest/Stress Protocol. Stress EKG is non-diagnostic, as this is pharmacological stress test. Small size, mild intensity, fixed perfusion defect most likely secondary to soft tissue attenuation (images obtained while sitting upright and BMI >30).  Without convincing evidence of reversible myocardial ischemia or prior infarct. LVEF 56%, LV size mildly dilated, wall thickness is preserved without regional wall motion abnormalities. No prior studies available for comparison. Low risk study.   ECHOCARDIOGRAM  COMPLETE 12/27/2020 Echocardiogram 12/27/2020: Left ventricle cavity is normal in size. Moderate concentric hypertrophy of the left ventricle. Normal global wall motion. Normal LV systolic function with EF 68%. Doppler evidence of grade I (impaired) diastolic dysfunction, normal LAP. Left atrial cavity is borderline dilated. Trileaflet aortic valve.  Mild (Grade I) aortic regurgitation. Mild (Grade I) mitral regurgitation. Mild tricuspid regurgitation. No evidence of pulmonary hypertension.   [facility] Extended out patient EKG monitoring 7 days starting 12/27/2020: Predominant rhythm is normal sinus rhythm.  There were 3 brief atrial tachycardia episodes.  There were occasional PACs, rare PVCs. There were no diary entries however triggered events correlated with PACs and PVCs.  There was no atrial fibrillation, there was no heart block, no complex ventricular arrhythmias.    Wynonia Musty [facility] Short Stay Center/Anesthesiology Phone [phone] 04/30/2022 2:50 PM

## 2022-04-30 NOTE — Telephone Encounter (Signed)
Spoke with patient's wife. TCAR rescheduled tentatively pending insurance approval for 05/15/22. Instructions reviewed. Advised if insurance approves surgery, will attempt to move surgery up earlier, if possible. She verbalized understanding to all information provided.

## 2022-04-30 NOTE — Telephone Encounter (Signed)
Message received from provider with noted elevated AST< ALT on preop labs - planned in office eval and repeat labs in next week - message sent to admin pool to schedule.

## 2022-05-01 ENCOUNTER — Telehealth: Payer: Self-pay

## 2022-05-01 NOTE — Telephone Encounter (Signed)
Patient's wife was called yesterday, 04/30/22, to advise her that [facility] is not approving his Right TCAR surgery, scheduled for 05/02/22.  We will need to cancel or reschedule.  Pt will need a Peer to Peer Review by Dr. Stanford Breed and [facility].  Wife verbalized understanding and will let her husband know.  Pts wife does state that patient is having more dizziness and confusion.

## 2022-05-06 ENCOUNTER — Ambulatory Visit: Payer: BC Managed Care – PPO | Admitting: Family Medicine

## 2022-05-06 ENCOUNTER — Encounter: Payer: Self-pay | Admitting: Family Medicine

## 2022-05-06 VITALS — BP 128/76 | HR 63 | Temp 97.8°F | Ht 72.0 in | Wt 224.6 lb

## 2022-05-06 DIAGNOSIS — R7989 Other specified abnormal findings of blood chemistry: Secondary | ICD-10-CM | POA: Diagnosis not present

## 2022-05-06 DIAGNOSIS — Z23 Encounter for immunization: Secondary | ICD-10-CM | POA: Diagnosis not present

## 2022-05-06 DIAGNOSIS — E1165 Type 2 diabetes mellitus with hyperglycemia: Secondary | ICD-10-CM

## 2022-05-06 NOTE — Progress Notes (Signed)
Subjective:  Patient ID: Robert Wise, male    DOB: July 25, 1960  Age: 62 y.o. MRN: [id number]  CC:  Chief Complaint  Patient presents with   Labs Only    Pt here for recheck on Liver function and notes had a high BG reading during exam as well but home sugars have been normal    Immunizations    Pt is due for 2nd shingles and is willing to get this today if advised by PCP     HPI Robert Wise presents for   Diabetes With hyperglycemia, elevated A1c of 7.2 in November but recently improved at 6.6 for preop labs. No recent med changes.   Plan to transcarotid artery revascularization on the right 05/15/2022.  Glucose 239 on 1/29.  Home readings - 130-150.  No personal readings at 200. Lower on his meter at time of elevated readings last week (130's on his meter compared to 200's at other office).  Watching diet.    Lab Results  Component Value Date   HGBA1C 6.6 (H) 04/29/2022   HGBA1C 7.2 (H) 02/25/2022   HGBA1C 7.1 (A) 11/05/2021   Lab Results  Component Value Date   MICROALBUR 3.7 (H) 07/02/2021   LDLCALC 76 02/05/2022   CREATININE 0.98 04/29/2022   Elevated LFTs  noted on preop labs January 29.  AST 87, ALT 275.  Previously normal at 18, 30 on 07/02/2021. No abd pain, nausea or vomiting. No jaundice or scleral icterus.  No recent illness.  Tylenol - intermittent, not daily prior and none since elevated readings.  Alcohol - none in 15 years.  No new supplements.    HM: 2nd shingrix today.   Immunization History  Administered Date(s) Administered   Influenza, Quadrivalent, Recombinant, Inj, Pf 01/17/2021   Influenza, Seasonal, Injecte, Preservative Fre 03/20/2012   Influenza,inj,Quad PF,6+ Mos 12/01/2014, 12/29/2015, 02/10/2017, 02/10/2017, 02/10/2018, 02/18/2019, 12/20/2019, 02/25/2022   PNEUMOCOCCAL CONJUGATE-20 11/27/2020   Pneumococcal Polysaccharide-23 08/01/2014   Tdap 08/01/2014   Zoster Recombinat (Shingrix) 02/25/2022     History Patient Active  Problem List   Diagnosis Date Noted   Anxiety state 01/25/2018   Fatigue 01/25/2018   Uncontrolled type 2 diabetes mellitus with complication, without long-term current use of insulin 02/10/2015   Chronic pain 12/01/2014   Depression 10/12/2014   Past Medical History:  Diagnosis Date   Anxiety    Depression    Diabetes mellitus without complication (HCC)    GERD (gastroesophageal reflux disease)    Hyperlipidemia    Hypertension    Hypothyroidism    OSA (obstructive sleep apnea) 12/15/2012   Sleep apnea    Thyroid disease    Past Surgical History:  Procedure Laterality Date   ANKLE SURGERY Left 04/01/2004   CATARACT EXTRACTION Right 04/2021   VASECTOMY     Allergies  Allergen Reactions   Tricor [Fenofibrate] Other (See Comments)    Unk reaction   Zocor [Simvastatin] Other (See Comments)    UNK reaction   Prior to Admission medications   Medication Sig Start Date End Date Taking? Authorizing Provider  aspirin (ASPIRIN CHILDRENS) 81 MG chewable tablet Chew 1 tablet (81 mg total) by mouth daily. 12/06/20  Yes Adrian Prows, MD  atorvastatin (LIPITOR) 80 MG tablet Take 1 tablet (80 mg total) by mouth daily. 02/25/22  Yes [name], MD  B Complex Vitamins (B-COMPLEX/B-12 PO) Take 1 tablet by mouth daily.   Yes [provider]  Blood Glucose Monitoring Suppl (ONE TOUCH ULTRA SYSTEM KIT)  W/DEVICE KIT 1 kit by Does not apply route once. 10/25/11  Yes Weber, Sarah L, PA-C  busPIRone (BUSPAR) 30 MG tablet Take 1 tablet (30 mg total) by mouth 2 (two) times daily. 03/04/22  Yes Hurst, Dorothea Glassman, PA-C  Cholecalciferol (VITAMIN D) 125 MCG (5000 UT) CAPS Take 5,000 Units by mouth daily.   Yes [provider]  clopidogrel (PLAVIX) 75 MG tablet Take 1 tablet (75 mg total) by mouth daily. 03/12/22  Yes Cherre Robins, MD  Dapagliflozin Pro-metFORMIN ER (XIGDUO XR) 07-998 MG TB24 Take 2 tablets by mouth daily. 02/25/22  Yes [name], MD  diazepam (VALIUM) 10 MG  tablet TAKE 1 TABLET (10 MG TOTAL) BY MOUTH AT BEDTIME AS NEEDED FOR ANXIETY Patient taking differently: Take 10 mg by mouth at bedtime as needed for anxiety. 03/22/22  Yes Hurst, Dorothea Glassman, PA-C  esomeprazole (NEXIUM) 40 MG capsule Take 1 capsule (40 mg total) by mouth daily. Patient taking differently: Take 20 mg by mouth daily. otc 04/11/17  Yes Jaynee Eagles, PA-C  ferrous sulfate 325 (65 FE) MG tablet Take 325 mg by mouth daily with breakfast.   Yes [provider]  fish oil-omega-3 fatty acids 1000 MG capsule Take 2 g by mouth daily.   Yes [provider]  GINKGO BILOBA EXTRACT PO Take 120 mg by mouth daily.   Yes [provider]  glucose blood (ONE TOUCH ULTRA TEST) test strip 1 each by Other route daily. 12/01/14  Yes Jaynee Eagles, PA-C  ibuprofen (ADVIL) 200 MG tablet Take 600 mg by mouth every 6 (six) hours as needed for mild pain.   Yes [provider]  lamoTRIgine (LAMICTAL) 200 MG tablet Take 1 tablet (200 mg total) by mouth 2 (two) times daily. 03/04/22  Yes Donnal Moat T, PA-C  Lancets [facility] ULTRASOFT) lancets USE AS INSTRUCTED 11/28/13  Yes [name], MD  levothyroxine (SYNTHROID) 150 MCG tablet Take 1 tablet (150 mcg total) by mouth daily before breakfast. 02/27/22  Yes [name], MD  neomycin-bacitracin-polymyxin (NEOSPORIN) OINT Apply 1 Application topically daily as needed for wound care.   Yes [provider]  olmesartan-hydrochlorothiazide (BENICAR HCT) 40-12.5 MG tablet TAKE 1 TABLET BY MOUTH EVERY DAY IN THE MORNING Patient taking differently: Take 1 tablet by mouth daily. 03/22/22  Yes Adrian Prows, MD  PARoxetine (PAXIL) 30 MG tablet TAKE 2 TABLETS BY MOUTH EVERY DAY 02/26/22  Yes [name], MD  vitamin B-12 (CYANOCOBALAMIN) 500 MCG tablet Take 500 mcg by mouth daily.   Yes [provider]  blood glucose meter kit and supplies KIT Dispense based on patient and insurance preference. Use up to four times  daily as directed. (FOR ICD-9 250.00, 250.01). 01/07/19   Forrest Moron, MD   Social History   Socioeconomic History   Marital status: Married    Spouse name: [name]   Number of children: 3   Years of education: 11.5   Highest education level: Not on file  Occupational History    Employer: [employer]    Comment: Driver  Tobacco Use   Smoking status: Every Day    Packs/day: 1.50    Years: 37.00    Total pack years: 55.50    Types: Cigarettes    Start date: 04/02/2019   Smokeless tobacco: Never  Vaping Use   Vaping Use: Never used  Substance and Sexual Activity   Alcohol use: No    Comment: quit: 08/14/2011   Drug use: No  Sexual activity: Yes    Birth control/protection: None  Other Topics Concern   Not on file  Social History Narrative   Patient lives at home with family.    Caffeine Use: 1 pot of coffee daily   Social Determinants of Health   Financial Resource Strain: Not on file  Food Insecurity: Not on file  Transportation Needs: Not on file  Physical Activity: Not on file  Stress: Not on file  Social Connections: Not on file  Intimate Partner Violence: Not on file    Review of Systems  Per HPI  Objective:   Vitals:   05/06/22 1326  BP: 128/76  Pulse: 63  Temp: 97.8 F (36.6 C)  TempSrc: Temporal  SpO2: 96%  Weight: 224 lb 9.6 oz (101.9 kg)  Height: 6' (1.829 m)     Physical Exam Vitals reviewed.  Constitutional:      Appearance: He is well-developed.  HENT:     Head: Normocephalic and atraumatic.  Eyes:     General: No scleral icterus. Neck:     Vascular: No carotid bruit or JVD.  Cardiovascular:     Rate and Rhythm: Normal rate and regular rhythm.     Heart sounds: Normal heart sounds. No murmur heard. Pulmonary:     Effort: Pulmonary effort is normal.     Breath sounds: Normal breath sounds. No rales.  Abdominal:     General: Abdomen is flat. Bowel sounds are normal. There is no distension.     Palpations: There is no mass.      Tenderness: There is no abdominal tenderness. There is no guarding or rebound.  Musculoskeletal:     Right lower leg: No edema.     Left lower leg: No edema.  Skin:    General: Skin is warm and dry.     Coloration: Skin is not jaundiced.     Findings: No bruising.  Neurological:     Mental Status: He is alert and oriented to person, place, and time.  Psychiatric:        Mood and Affect: Mood normal.      Assessment & Plan:  KOLESON REIFSTECK is a 62 y.o. male . Type 2 diabetes mellitus with hyperglycemia, without long-term current use of insulin (HCC) - Plan: Comp Met (CMET) -Elevated reading on preop labs but 1-month blood sugar is actually improved.  Continue to monitor home readings with RTC precautions, commended on dietary changes.  No med changes for now.  Elevated LFTs - Plan: Comp Met (CMET), Hepatitis, Acute  -Recent elevated LFTs on preop labs, asymptomatic, previously normal LFTs last year.  Denies recent illness, alcohol intake, or significant Tylenol.  Repeat labs today with acute hepatitis panel.  If still elevated would consider ultrasound as next step.  RTC/ER precautions given if symptomatic.  Continue to avoid Tylenol Need for shingles vaccine - Plan: Varicella-zoster vaccine IM   No orders of the defined types were placed in this encounter.  Patient Instructions  Second and final shingles vaccine given today.  Continue to avoid Tylenol.  I will repeat the liver tests as well as look for viral infections that can cause elevated liver tests on your blood work today.  Additionally I will see the blood sugar today on those labs.  Continue to watch diet.  If liver tests are still elevated next step would be an ultrasound or some imaging but I will let you know that once I see the results.  If any point in  time you have abdominal pain, nausea, vomiting, yellowing of the skin or the eyes, be seen right away. Take care!     Signed,   Meredith Staggers, MD [place]  [facility], [facility] 05/06/22 2:00 PM

## 2022-05-06 NOTE — Patient Instructions (Addendum)
Second and final shingles vaccine given today.  Continue to avoid Tylenol.  I will repeat the liver tests as well as look for viral infections that can cause elevated liver tests on your blood work today.  Additionally I will see the blood sugar today on those labs.  Continue to watch diet.  If liver tests are still elevated next step would be an ultrasound or some imaging but I will let you know that once I see the results.  If any point in time you have abdominal pain, nausea, vomiting, yellowing of the skin or the eyes, be seen right away. Take care!

## 2022-05-07 LAB — COMPREHENSIVE METABOLIC PANEL
ALT: 46 U/L (ref 0–53)
AST: 15 U/L (ref 0–37)
Albumin: 4.7 g/dL (ref 3.5–5.2)
Alkaline Phosphatase: 100 U/L (ref 39–117)
BUN: 18 mg/dL (ref 6–23)
CO2: 22 mEq/L (ref 19–32)
Calcium: 9.4 mg/dL (ref 8.4–10.5)
Chloride: 102 mEq/L (ref 96–112)
Creatinine, Ser: 1.06 mg/dL (ref 0.40–1.50)
GFR: 75.65 mL/min (ref >60.00)
Glucose, Bld: 114 mg/dL — ABNORMAL HIGH (ref 70–99)
Potassium: 4.7 mEq/L (ref 3.5–5.1)
Sodium: 137 mEq/L (ref 135–145)
Total Bilirubin: 0.5 mg/dL (ref 0.2–1.2)
Total Protein: 7.8 g/dL (ref 6.0–8.3)

## 2022-05-07 LAB — HEPATITIS PANEL, ACUTE
Hep A IgM: NONREACTIVE
Hep B C IgM: NONREACTIVE
Hepatitis B Surface Ag: NONREACTIVE
Hepatitis C Ab: NONREACTIVE

## 2022-05-07 NOTE — Addendum Note (Signed)
Addended by: Nicholas Lose on: 05/07/2022 04:39 PM   Modules accepted: Orders

## 2022-05-09 ENCOUNTER — Telehealth: Payer: Self-pay

## 2022-05-09 NOTE — Anesthesia Preprocedure Evaluation (Addendum)
Anesthesia Evaluation  Patient identified by MRN, date of birth, ID band Patient awake    Reviewed: Allergy & Precautions, NPO status , Patient's Chart, lab work & pertinent test results  Airway Mallampati: II  TM Distance: >3 FB Neck ROM: Full    Dental  (+) Edentulous Upper   Pulmonary sleep apnea and Continuous Positive Airway Pressure Ventilation , Current Smoker and Patient abstained from smoking.   Pulmonary exam normal        Cardiovascular hypertension, Pt. on medications  Rhythm:Regular Rate:Normal     Neuro/Psych   Anxiety Depression    negative neurological ROS     GI/Hepatic Neg liver ROS,GERD  Medicated,,  Endo/Other  diabetesHypothyroidism    Renal/GU negative Renal ROS     Musculoskeletal   Abdominal Normal abdominal exam  (+)   Peds  Hematology negative hematology ROS (+)   Anesthesia Other Findings   Reproductive/Obstetrics                             Anesthesia Physical Anesthesia Plan  ASA: 3  Anesthesia Plan: General   Post-op Pain Management:    Induction: Intravenous  PONV Risk Score and Plan: 1 and Ondansetron, Dexamethasone, Midazolam and Treatment may vary due to age or medical condition  Airway Management Planned: Mask and Oral ETT  Additional Equipment: Arterial line  Intra-op Plan:   Post-operative Plan: Extubation in OR  Informed Consent: I have reviewed the patients History and Physical, chart, labs and discussed the procedure including the risks, benefits and alternatives for the proposed anesthesia with the patient or authorized representative who has indicated his/her understanding and acceptance.     Dental advisory given  Plan Discussed with: CRNA  Anesthesia Plan Comments: (AT note by Karoline Caldwell, PA-C: Patient follows with cardiology for history of HLD, OSA on CPAP, HTN, tobacco abuse, carotid stenosis.  Echo 11/2020 showed normal LV  systolic function EF 0000000, grade 1 DD, mild aortic regurgitation, mild mitral regurgitation.  Nuclear stress 12/2018 was low risk.  He has been monitored by Dr. Einar Gip with serial carotid duplex for bilateral carotid stenosis right >left.  Last duplex 01/22/2022 showed 80 to 99% stenosis in the R ICA as well as 50 to [id number] in the LICA.  Patient was referred to vascular surgeon Dr. Stanford Breed who recommended proceeding with TCAR.  Follows with psychiatry for history of recurrent major depressive disorder, generalized eye disorder, restless leg syndrome.  He is maintained on BuSpar, Valium, Lamictal, Paxil.  Insulin-dependent DM2, well-controlled, A1c 6.6 on preop labs.  Preop labs notable for elevated AST at 87 and ALT 275.  Remainder of labs were unremarkable.  I reached out to patient's PCP Dr. Merri Ray regarding patient's transaminitis.  He saw the patient on 05/06/2022 for recheck and repeat labs.  Labs at that time showed normalization of liver function.  Hepatitis panel was nonreactive.  EKG 02/19/2022: Normal sinus rhythm at rate of 63 bpm.  Normal axis.  Borderline left atrial enlargement.  Anterior lateral ST elevation, early repolarization variant.  CTA head neck 02/05/2022: IMPRESSION: 1. Negative CT head. 2. 80% diameter stenosis proximal right internal carotid artery due to calcified plaque. 3. 25% diameter stenosis proximal left internal carotid artery due to calcified plaque. 4. Mild stenosis cavernous carotid bilaterally due to atherosclerotic disease. 5. No intracranial large vessel occlusion or significant stenosis. 6. Aortic atherosclerosis.  Nuclear stress 12/19/2020 1 Day Rest/Stress Protocol. Stress EKG is non-diagnostic, as  this is pharmacological stress test. Small size, mild intensity, fixed perfusion defect most likely secondary to soft tissue attenuation (images obtained while sitting upright and BMI >30). Without convincing evidence of reversible myocardial ischemia or  prior infarct. LVEF 56%, LV size mildly dilated, wall thickness is preserved without regional wall motion abnormalities. No prior studies available for comparison. Low risk study.  TTE 12/27/2020 Narrative Echocardiogram 12/27/2020: Left ventricle cavity is normal in size. Moderate concentric hypertrophy of the left ventricle. Normal global wall motion. Normal LV systolic function with EF 68%. Doppler evidence of grade I (impaired) diastolic dysfunction, normal LAP. Left atrial cavity is borderline dilated. Trileaflet aortic valve. Mild (Grade I) aortic regurgitation. Mild (Grade I) mitral regurgitation. Mild tricuspid regurgitation. No evidence of pulmonary hypertension.  Zio Patch Extended out patient EKG monitoring 7 days starting 12/27/2020: Predominant rhythm is normal sinus rhythm. There were 3 brief atrial tachycardia episodes. There were occasional PACs, rare PVCs. There were no diary entries however triggered events correlated with PACs and PVCs. There was no atrial fibrillation, there was no heart block, no complex ventricular arrhythmias.   )        Anesthesia Quick Evaluation

## 2022-05-09 NOTE — Progress Notes (Signed)
Anesthesia Chart Review:  Patient follows with cardiology for history of HLD, OSA on CPAP, HTN, tobacco abuse, carotid stenosis.  Echo 11/2020 showed normal LV systolic function EF 95%, grade 1 DD, mild aortic regurgitation, mild mitral regurgitation.  Nuclear stress 12/2018 was low risk.  He has been monitored by Dr. Einar Gip with serial carotid duplex for bilateral carotid stenosis right >left.  Last duplex 01/22/2022 showed 80 to 99% stenosis in the R ICA as well as 50 to 62% in the LICA.  Patient was referred to vascular surgeon Dr. Stanford Breed who recommended proceeding with TCAR.  Follows with psychiatry for history of recurrent major depressive disorder, generalized eye disorder, restless leg syndrome.  He is maintained on BuSpar, Valium, Lamictal, Paxil.  Insulin-dependent DM2, well-controlled, A1c 6.6 on preop labs.  Preop labs notable for elevated AST at 87 and ALT 275.  Remainder of labs were unremarkable.  I reached out to patient's PCP Dr. Merri Ray regarding patient's transaminitis.  He saw the patient on 05/06/2022 for recheck and repeat labs.  Labs at that time showed normalization of liver function.  Hepatitis panel was nonreactive.  EKG 02/19/2022: Normal sinus rhythm at rate of 63 bpm.  Normal axis.  Borderline left atrial enlargement.  Anterior lateral ST elevation, early repolarization variant.  CTA head neck 02/05/2022: IMPRESSION: 1. Negative CT head. 2. 80% diameter stenosis proximal right internal carotid artery due to calcified plaque. 3. 25% diameter stenosis proximal left internal carotid artery due to calcified plaque. 4. Mild stenosis cavernous carotid bilaterally due to atherosclerotic disease. 5. No intracranial large vessel occlusion or significant stenosis. 6. Aortic atherosclerosis.  Nuclear stress 12/19/2020  1 Day Rest/Stress Protocol. Stress EKG is non-diagnostic, as this is pharmacological stress test. Small size, mild intensity, fixed perfusion defect most  likely secondary to soft tissue attenuation (images obtained while sitting upright and BMI >30).  Without convincing evidence of reversible myocardial ischemia or prior infarct. LVEF 56%, LV size mildly dilated, wall thickness is preserved without regional wall motion abnormalities. No prior studies available for comparison. Low risk study.   TTE 12/27/2020 Narrative Echocardiogram 12/27/2020: Left ventricle cavity is normal in size. Moderate concentric hypertrophy of the left ventricle. Normal global wall motion. Normal LV systolic function with EF 68%. Doppler evidence of grade I (impaired) diastolic dysfunction, normal LAP. Left atrial cavity is borderline dilated. Trileaflet aortic valve.  Mild (Grade I) aortic regurgitation. Mild (Grade I) mitral regurgitation. Mild tricuspid regurgitation. No evidence of pulmonary hypertension.   Zio Patch Extended out patient EKG monitoring 7 days starting 12/27/2020: Predominant rhythm is normal sinus rhythm.  There were 3 brief atrial tachycardia episodes. There were occasional PACs, rare PVCs. There were no diary entries however triggered events correlated with PACs and PVCs. There was no atrial fibrillation, there was no heart block, no complex ventricular arrhythmias.    Wynonia Musty [facility]/Anesthesiology Phone [phone] 05/09/2022 10:46 AM

## 2022-05-09 NOTE — Telephone Encounter (Signed)
Per Dr. Stanford Breed, patient needs to be scheduled for a right carotid endarterectomy. Spoke with patient regarding denial for TCAR surgery and Dr. Mora Appl recommendations. Patient agreed to proceed with endarterectomy surgery to be scheduled on 05/15/22. Instructions provided and patient verbalized understanding.

## 2022-05-14 ENCOUNTER — Encounter: Payer: Self-pay | Admitting: Vascular Surgery

## 2022-05-15 ENCOUNTER — Other Ambulatory Visit: Payer: Self-pay

## 2022-05-15 ENCOUNTER — Encounter (HOSPITAL_COMMUNITY): Payer: Self-pay | Admitting: Vascular Surgery

## 2022-05-15 ENCOUNTER — Encounter (HOSPITAL_COMMUNITY)
Admission: RE | Disposition: A | Discharge status: Home / Self Care | Payer: Self-pay | Source: Home / Self Care | Attending: Vascular Surgery

## 2022-05-15 ENCOUNTER — Inpatient Hospital Stay (HOSPITAL_COMMUNITY)
Admission: RE | Admit: 2022-05-15 | Discharge: 2022-05-16 | DRG: 038 | Disposition: A | Discharge status: Home / Self Care | Payer: BC Managed Care – PPO | Attending: Vascular Surgery | Admitting: Vascular Surgery

## 2022-05-15 ENCOUNTER — Inpatient Hospital Stay (HOSPITAL_COMMUNITY): Payer: BC Managed Care – PPO | Admitting: Physician Assistant

## 2022-05-15 DIAGNOSIS — Z9841 Cataract extraction status, right eye: Secondary | ICD-10-CM | POA: Diagnosis not present

## 2022-05-15 DIAGNOSIS — E039 Hypothyroidism, unspecified: Secondary | ICD-10-CM | POA: Diagnosis not present

## 2022-05-15 DIAGNOSIS — D62 Acute posthemorrhagic anemia: Secondary | ICD-10-CM | POA: Diagnosis not present

## 2022-05-15 DIAGNOSIS — Z888 Allergy status to other drugs, medicaments and biological substances status: Secondary | ICD-10-CM

## 2022-05-15 DIAGNOSIS — I10 Essential (primary) hypertension: Secondary | ICD-10-CM | POA: Diagnosis present

## 2022-05-15 DIAGNOSIS — Z8249 Family history of ischemic heart disease and other diseases of the circulatory system: Secondary | ICD-10-CM | POA: Diagnosis not present

## 2022-05-15 DIAGNOSIS — E785 Hyperlipidemia, unspecified: Secondary | ICD-10-CM | POA: Diagnosis not present

## 2022-05-15 DIAGNOSIS — F32A Depression, unspecified: Secondary | ICD-10-CM | POA: Diagnosis present

## 2022-05-15 DIAGNOSIS — G2581 Restless legs syndrome: Secondary | ICD-10-CM | POA: Diagnosis not present

## 2022-05-15 DIAGNOSIS — Z9989 Dependence on other enabling machines and devices: Secondary | ICD-10-CM | POA: Diagnosis not present

## 2022-05-15 DIAGNOSIS — F17210 Nicotine dependence, cigarettes, uncomplicated: Secondary | ICD-10-CM | POA: Diagnosis not present

## 2022-05-15 DIAGNOSIS — G4733 Obstructive sleep apnea (adult) (pediatric): Secondary | ICD-10-CM | POA: Diagnosis present

## 2022-05-15 DIAGNOSIS — K219 Gastro-esophageal reflux disease without esophagitis: Secondary | ICD-10-CM | POA: Diagnosis present

## 2022-05-15 DIAGNOSIS — E119 Type 2 diabetes mellitus without complications: Secondary | ICD-10-CM | POA: Diagnosis present

## 2022-05-15 DIAGNOSIS — I080 Rheumatic disorders of both mitral and aortic valves: Secondary | ICD-10-CM | POA: Diagnosis present

## 2022-05-15 DIAGNOSIS — I6521 Occlusion and stenosis of right carotid artery: Secondary | ICD-10-CM | POA: Diagnosis not present

## 2022-05-15 DIAGNOSIS — E079 Disorder of thyroid, unspecified: Secondary | ICD-10-CM | POA: Diagnosis not present

## 2022-05-15 DIAGNOSIS — F419 Anxiety disorder, unspecified: Secondary | ICD-10-CM | POA: Diagnosis present

## 2022-05-15 HISTORY — PX: PATCH ANGIOPLASTY: SHX6230

## 2022-05-15 HISTORY — PX: ENDARTERECTOMY: SHX5162

## 2022-05-15 LAB — POCT ACTIVATED CLOTTING TIME: Activated Clotting Time: 369 seconds

## 2022-05-15 LAB — GLUCOSE, CAPILLARY
Glucose-Capillary: 136 mg/dL — ABNORMAL HIGH (ref 70–99)
Glucose-Capillary: 141 mg/dL — ABNORMAL HIGH (ref 70–99)
Glucose-Capillary: 69 mg/dL — ABNORMAL LOW (ref 70–99)
Glucose-Capillary: 83 mg/dL (ref 70–99)

## 2022-05-15 LAB — TYPE AND SCREEN
ABO/RH(D): A POS
Antibody Screen: NEGATIVE

## 2022-05-15 SURGERY — ENDARTERECTOMY, CAROTID
Anesthesia: General | Site: Neck | Laterality: Right

## 2022-05-15 MED ORDER — EPHEDRINE 5 MG/ML INJ
INTRAVENOUS | Status: AC
  Filled 2022-05-15: qty 5

## 2022-05-15 MED ORDER — OXYCODONE-ACETAMINOPHEN 5-325 MG PO TABS
1.0000 | ORAL_TABLET | ORAL | Status: DC | PRN
  Administered 2022-05-15 – 2022-05-16 (×3): 2 via ORAL
  Filled 2022-05-15 (×2): qty 1
  Filled 2022-05-15 (×2): qty 2

## 2022-05-15 MED ORDER — PROPOFOL 10 MG/ML IV BOLUS
INTRAVENOUS | Status: AC
  Filled 2022-05-15: qty 20

## 2022-05-15 MED ORDER — ORAL CARE MOUTH RINSE: 15.0000 mL | Freq: Once | OROMUCOSAL | Status: DC

## 2022-05-15 MED ORDER — SODIUM CHLORIDE 0.9 % IV SOLN: INTRAVENOUS | Status: DC

## 2022-05-15 MED ORDER — DOCUSATE SODIUM 100 MG PO CAPS
100.0000 mg | ORAL_CAPSULE | Freq: Every day | ORAL | Status: DC
  Administered 2022-05-16: 100 mg via ORAL
  Filled 2022-05-15: qty 1

## 2022-05-15 MED ORDER — ROCURONIUM BROMIDE 10 MG/ML (PF) SYRINGE
PREFILLED_SYRINGE | INTRAVENOUS | Status: AC
  Filled 2022-05-15: qty 10

## 2022-05-15 MED ORDER — MAGNESIUM SULFATE 2 GM/50ML IV SOLN: 2.0000 g | Freq: Every day | INTRAVENOUS | Status: DC | PRN

## 2022-05-15 MED ORDER — FENTANYL CITRATE (PF) 250 MCG/5ML IJ SOLN
INTRAMUSCULAR | Status: DC | PRN
  Administered 2022-05-15: 100 ug via INTRAVENOUS
  Administered 2022-05-15 (×3): 50 ug via INTRAVENOUS

## 2022-05-15 MED ORDER — POTASSIUM CHLORIDE CRYS ER 20 MEQ PO TBCR: 20.0000 meq | EXTENDED_RELEASE_TABLET | Freq: Every day | ORAL | Status: DC | PRN

## 2022-05-15 MED ORDER — 0.9 % SODIUM CHLORIDE (POUR BTL) OPTIME
TOPICAL | Status: DC | PRN
  Administered 2022-05-15: 2000 mL

## 2022-05-15 MED ORDER — HEMOSTATIC AGENTS (NO CHARGE) OPTIME
TOPICAL | Status: DC | PRN
  Administered 2022-05-15: 1 via TOPICAL

## 2022-05-15 MED ORDER — FERROUS SULFATE 325 (65 FE) MG PO TABS
325.0000 mg | ORAL_TABLET | Freq: Every day | ORAL | Status: DC
  Administered 2022-05-16: 325 mg via ORAL
  Filled 2022-05-15: qty 1

## 2022-05-15 MED ORDER — PROPOFOL 10 MG/ML IV BOLUS
INTRAVENOUS | Status: DC | PRN
  Administered 2022-05-15: 50 mg via INTRAVENOUS
  Administered 2022-05-15: 150 mg via INTRAVENOUS

## 2022-05-15 MED ORDER — LEVOTHYROXINE SODIUM 75 MCG PO TABS
150.0000 ug | ORAL_TABLET | Freq: Every day | ORAL | Status: DC
  Administered 2022-05-16: 150 ug via ORAL
  Filled 2022-05-15: qty 2

## 2022-05-15 MED ORDER — PROTAMINE SULFATE 10 MG/ML IV SOLN
INTRAVENOUS | Status: DC | PRN
  Administered 2022-05-15: 50 mg via INTRAVENOUS

## 2022-05-15 MED ORDER — ACETAMINOPHEN 650 MG RE SUPP: 325.0000 mg | RECTAL | Status: DC | PRN

## 2022-05-15 MED ORDER — PANTOPRAZOLE SODIUM 40 MG PO TBEC
40.0000 mg | DELAYED_RELEASE_TABLET | Freq: Every day | ORAL | Status: DC
  Administered 2022-05-15 – 2022-05-16 (×2): 40 mg via ORAL
  Filled 2022-05-15 (×2): qty 1

## 2022-05-15 MED ORDER — INSULIN ASPART 100 UNIT/ML IJ SOLN: 0.0000 [IU] | Freq: Three times a day (TID) | INTRAMUSCULAR | Status: DC

## 2022-05-15 MED ORDER — ATORVASTATIN CALCIUM 80 MG PO TABS
80.0000 mg | ORAL_TABLET | Freq: Every day | ORAL | Status: DC
  Administered 2022-05-16: 80 mg via ORAL
  Filled 2022-05-15: qty 1

## 2022-05-15 MED ORDER — LIDOCAINE HCL 1 % IJ SOLN
INTRAMUSCULAR | Status: AC
  Filled 2022-05-15: qty 20

## 2022-05-15 MED ORDER — HEPARIN SODIUM (PORCINE) 1000 UNIT/ML IJ SOLN
INTRAMUSCULAR | Status: AC
  Filled 2022-05-15: qty 10

## 2022-05-15 MED ORDER — HEPARIN SODIUM (PORCINE) 1000 UNIT/ML IJ SOLN
INTRAMUSCULAR | Status: DC | PRN
  Administered 2022-05-15: 10000 [IU] via INTRAVENOUS

## 2022-05-15 MED ORDER — ROCURONIUM BROMIDE 10 MG/ML (PF) SYRINGE
PREFILLED_SYRINGE | INTRAVENOUS | Status: DC | PRN
  Administered 2022-05-15: 20 mg via INTRAVENOUS
  Administered 2022-05-15: 60 mg via INTRAVENOUS

## 2022-05-15 MED ORDER — LIDOCAINE 2% (20 MG/ML) 5 ML SYRINGE
INTRAMUSCULAR | Status: DC | PRN
  Administered 2022-05-15: 60 mg via INTRAVENOUS

## 2022-05-15 MED ORDER — GLYCOPYRROLATE 0.2 MG/ML IJ SOLN
INTRAMUSCULAR | Status: DC | PRN
  Administered 2022-05-15 (×2): .2 mg via INTRAVENOUS

## 2022-05-15 MED ORDER — CHLORHEXIDINE GLUCONATE 0.12 % MT SOLN
15.0000 mL | Freq: Once | OROMUCOSAL | Status: AC
  Administered 2022-05-15: 15 mL via OROMUCOSAL
  Filled 2022-05-15: qty 15

## 2022-05-15 MED ORDER — PHENOL 1.4 % MT LIQD
1.0000 | OROMUCOSAL | Status: DC | PRN
  Filled 2022-05-15: qty 177

## 2022-05-15 MED ORDER — FENTANYL CITRATE (PF) 100 MCG/2ML IJ SOLN
25.0000 ug | INTRAMUSCULAR | Status: DC | PRN
  Administered 2022-05-15 (×3): 50 ug via INTRAVENOUS

## 2022-05-15 MED ORDER — OLMESARTAN MEDOXOMIL-HCTZ 40-12.5 MG PO TABS: 1.0000 | ORAL_TABLET | Freq: Every day | ORAL | Status: DC

## 2022-05-15 MED ORDER — DIAZEPAM 5 MG PO TABS: 10.0000 mg | ORAL_TABLET | Freq: Every evening | ORAL | Status: DC | PRN

## 2022-05-15 MED ORDER — PHENYLEPHRINE HCL-NACL 20-0.9 MG/250ML-% IV SOLN
INTRAVENOUS | Status: DC | PRN
  Administered 2022-05-15: 40 ug/min via INTRAVENOUS
  Administered 2022-05-15: 25 ug/min via INTRAVENOUS

## 2022-05-15 MED ORDER — FENTANYL CITRATE (PF) 250 MCG/5ML IJ SOLN
INTRAMUSCULAR | Status: AC
  Filled 2022-05-15: qty 5

## 2022-05-15 MED ORDER — CEFAZOLIN SODIUM-DEXTROSE 2-4 GM/100ML-% IV SOLN
2.0000 g | INTRAVENOUS | Status: AC
  Administered 2022-05-15: 2 g via INTRAVENOUS
  Filled 2022-05-15: qty 100

## 2022-05-15 MED ORDER — ACETAMINOPHEN 325 MG PO TABS: 325.0000 mg | ORAL_TABLET | ORAL | Status: DC | PRN

## 2022-05-15 MED ORDER — SUGAMMADEX SODIUM 200 MG/2ML IV SOLN
INTRAVENOUS | Status: DC | PRN
  Administered 2022-05-15: 200 mg via INTRAVENOUS

## 2022-05-15 MED ORDER — DAPAGLIFLOZIN PROPANEDIOL 10 MG PO TABS
10.0000 mg | ORAL_TABLET | Freq: Every day | ORAL | Status: DC
  Filled 2022-05-15: qty 1

## 2022-05-15 MED ORDER — METOPROLOL TARTRATE 5 MG/5ML IV SOLN: 2.0000 mg | INTRAVENOUS | Status: DC | PRN

## 2022-05-15 MED ORDER — SODIUM CHLORIDE 0.9 % IV SOLN: 500.0000 mL | Freq: Once | INTRAVENOUS | Status: DC | PRN

## 2022-05-15 MED ORDER — ALUM & MAG HYDROXIDE-SIMETH 200-200-20 MG/5ML PO SUSP: 15.0000 mL | ORAL | Status: DC | PRN

## 2022-05-15 MED ORDER — LABETALOL HCL 5 MG/ML IV SOLN: 10.0000 mg | INTRAVENOUS | Status: DC | PRN

## 2022-05-15 MED ORDER — HYDRALAZINE HCL 20 MG/ML IJ SOLN: 5.0000 mg | INTRAMUSCULAR | Status: DC | PRN

## 2022-05-15 MED ORDER — ONDANSETRON HCL 4 MG/2ML IJ SOLN
INTRAMUSCULAR | Status: AC
  Filled 2022-05-15: qty 2

## 2022-05-15 MED ORDER — BISACODYL 10 MG RE SUPP: 10.0000 mg | Freq: Every day | RECTAL | Status: DC | PRN

## 2022-05-15 MED ORDER — ONDANSETRON HCL 4 MG/2ML IJ SOLN
INTRAMUSCULAR | Status: DC | PRN
  Administered 2022-05-15: 4 mg via INTRAVENOUS

## 2022-05-15 MED ORDER — ACETAMINOPHEN 10 MG/ML IV SOLN
1000.0000 mg | Freq: Once | INTRAVENOUS | Status: DC | PRN
  Administered 2022-05-15: 1000 mg via INTRAVENOUS

## 2022-05-15 MED ORDER — FENTANYL CITRATE (PF) 100 MCG/2ML IJ SOLN
INTRAMUSCULAR | Status: AC
  Filled 2022-05-15: qty 2

## 2022-05-15 MED ORDER — EPHEDRINE SULFATE-NACL 50-0.9 MG/10ML-% IV SOSY
PREFILLED_SYRINGE | INTRAVENOUS | Status: DC | PRN
  Administered 2022-05-15 (×2): 5 mg via INTRAVENOUS
  Administered 2022-05-15: 10 mg via INTRAVENOUS
  Administered 2022-05-15: 5 mg via INTRAVENOUS

## 2022-05-15 MED ORDER — HEPARIN 6000 UNIT IRRIGATION SOLUTION
Status: AC
  Filled 2022-05-15: qty 500

## 2022-05-15 MED ORDER — INSULIN ASPART 100 UNIT/ML IJ SOLN: 0.0000 [IU] | INTRAMUSCULAR | Status: DC | PRN

## 2022-05-15 MED ORDER — POLYETHYLENE GLYCOL 3350 17 G PO PACK: 17.0000 g | PACK | Freq: Every day | ORAL | Status: DC | PRN

## 2022-05-15 MED ORDER — ACETAMINOPHEN 10 MG/ML IV SOLN
INTRAVENOUS | Status: AC
  Filled 2022-05-15: qty 100

## 2022-05-15 MED ORDER — CHLORHEXIDINE GLUCONATE 0.12 % MT SOLN: 15.0000 mL | Freq: Once | OROMUCOSAL | Status: DC

## 2022-05-15 MED ORDER — SODIUM CHLORIDE 0.9 % IV SOLN
0.0125 ug/kg/min | INTRAVENOUS | Status: AC
  Administered 2022-05-15: .05 ug/kg/min via INTRAVENOUS
  Filled 2022-05-15: qty 2000

## 2022-05-15 MED ORDER — HEPARIN 6000 UNIT IRRIGATION SOLUTION
Status: DC | PRN
  Administered 2022-05-15: 1

## 2022-05-15 MED ORDER — CHLORHEXIDINE GLUCONATE CLOTH 2 % EX PADS: 6.0000 | MEDICATED_PAD | Freq: Once | CUTANEOUS | Status: DC

## 2022-05-15 MED ORDER — IRBESARTAN 300 MG PO TABS
300.0000 mg | ORAL_TABLET | Freq: Every day | ORAL | Status: DC
  Administered 2022-05-16: 300 mg via ORAL
  Filled 2022-05-15: qty 1

## 2022-05-15 MED ORDER — PROTAMINE SULFATE 10 MG/ML IV SOLN
INTRAVENOUS | Status: AC
  Filled 2022-05-15: qty 5

## 2022-05-15 MED ORDER — DAPAGLIFLOZIN PRO-METFORMIN ER 5-1000 MG PO TB24: 2.0000 | ORAL_TABLET | Freq: Every day | ORAL | Status: DC

## 2022-05-15 MED ORDER — PAROXETINE HCL 30 MG PO TABS
60.0000 mg | ORAL_TABLET | Freq: Every day | ORAL | Status: DC
  Administered 2022-05-16: 60 mg via ORAL
  Filled 2022-05-15: qty 2

## 2022-05-15 MED ORDER — HYDROCHLOROTHIAZIDE 12.5 MG PO TABS
12.5000 mg | ORAL_TABLET | Freq: Every day | ORAL | Status: DC
  Filled 2022-05-15: qty 1

## 2022-05-15 MED ORDER — LAMOTRIGINE 100 MG PO TABS
200.0000 mg | ORAL_TABLET | Freq: Two times a day (BID) | ORAL | Status: DC
  Administered 2022-05-15 – 2022-05-16 (×2): 200 mg via ORAL
  Filled 2022-05-15 (×2): qty 2

## 2022-05-15 MED ORDER — CLOPIDOGREL BISULFATE 75 MG PO TABS
75.0000 mg | ORAL_TABLET | Freq: Every day | ORAL | Status: DC
  Administered 2022-05-16: 75 mg via ORAL
  Filled 2022-05-15: qty 1

## 2022-05-15 MED ORDER — LACTATED RINGERS IV SOLN: INTRAVENOUS | Status: DC

## 2022-05-15 MED ORDER — BUSPIRONE HCL 5 MG PO TABS
30.0000 mg | ORAL_TABLET | Freq: Two times a day (BID) | ORAL | Status: DC
  Administered 2022-05-15 – 2022-05-16 (×2): 30 mg via ORAL
  Filled 2022-05-15 (×2): qty 6

## 2022-05-15 MED ORDER — LIDOCAINE 2% (20 MG/ML) 5 ML SYRINGE
INTRAMUSCULAR | Status: AC
  Filled 2022-05-15: qty 5

## 2022-05-15 MED ORDER — METFORMIN HCL ER 500 MG PO TB24
2000.0000 mg | ORAL_TABLET | Freq: Every day | ORAL | Status: DC
  Filled 2022-05-15: qty 4

## 2022-05-15 MED ORDER — ASPIRIN 81 MG PO CHEW
81.0000 mg | CHEWABLE_TABLET | Freq: Every day | ORAL | Status: DC
  Administered 2022-05-16: 81 mg via ORAL
  Filled 2022-05-15: qty 1

## 2022-05-15 MED ORDER — PHENYLEPHRINE 80 MCG/ML (10ML) SYRINGE FOR IV PUSH (FOR BLOOD PRESSURE SUPPORT)
PREFILLED_SYRINGE | INTRAVENOUS | Status: DC | PRN
  Administered 2022-05-15 (×2): 80 ug via INTRAVENOUS

## 2022-05-15 MED ORDER — ORAL CARE MOUTH RINSE: 15.0000 mL | Freq: Once | OROMUCOSAL | Status: AC

## 2022-05-15 MED ORDER — ONDANSETRON HCL 4 MG/2ML IJ SOLN: 4.0000 mg | Freq: Four times a day (QID) | INTRAMUSCULAR | Status: DC | PRN

## 2022-05-15 MED ORDER — GUAIFENESIN-DM 100-10 MG/5ML PO SYRP: 15.0000 mL | ORAL_SOLUTION | ORAL | Status: DC | PRN

## 2022-05-15 MED ORDER — PHENYLEPHRINE 80 MCG/ML (10ML) SYRINGE FOR IV PUSH (FOR BLOOD PRESSURE SUPPORT)
PREFILLED_SYRINGE | INTRAVENOUS | Status: AC
  Filled 2022-05-15: qty 10

## 2022-05-15 MED ORDER — MORPHINE SULFATE (PF) 2 MG/ML IV SOLN
2.0000 mg | INTRAVENOUS | Status: DC | PRN
  Administered 2022-05-16: 2 mg via INTRAVENOUS
  Filled 2022-05-15: qty 1

## 2022-05-15 SURGICAL SUPPLY — 49 items
APL PRP STRL LF DISP 70% ISPRP (MISCELLANEOUS) ×1
APL SKNCLS STERI-STRIP NONHPOA (GAUZE/BANDAGES/DRESSINGS) ×1
BENZOIN TINCTURE PRP APPL 2/3 (GAUZE/BANDAGES/DRESSINGS) ×1 IMPLANT
BLADE MINI 60D BLUE (BLADE) IMPLANT
BLADE MINI RND TIP GREEN BEAV (BLADE) ×1 IMPLANT
BNDG ELASTIC 2X5.8 VLCR STR LF (GAUZE/BANDAGES/DRESSINGS) IMPLANT
CANISTER SUCT 3000ML PPV (MISCELLANEOUS) ×1 IMPLANT
CANNULA VESSEL 3MM 2 BLNT TIP (CANNULA) ×2 IMPLANT
CATH ROBINSON RED A/P 18FR (CATHETERS) ×1 IMPLANT
CHLORAPREP W/TINT 26 (MISCELLANEOUS) ×1 IMPLANT
CLIP LIGATING EXTRA MED SLVR (CLIP) ×1 IMPLANT
CLIP LIGATING EXTRA SM BLUE (MISCELLANEOUS) ×1 IMPLANT
COVER PROBE W GEL 5X96 (DRAPES) ×1 IMPLANT
DRAIN CHANNEL 15F RND FF W/TCR (WOUND CARE) IMPLANT
DRSG COVADERM 4X6 (GAUZE/BANDAGES/DRESSINGS) IMPLANT
DRSG COVADERM 4X8 (GAUZE/BANDAGES/DRESSINGS) ×1 IMPLANT
ELECT REM PT RETURN 9FT ADLT (ELECTROSURGICAL) ×1
ELECTRODE REM PT RTRN 9FT ADLT (ELECTROSURGICAL) ×1 IMPLANT
EVACUATOR SILICONE 100CC (DRAIN) IMPLANT
GLOVE BIO SURGEON STRL SZ8 (GLOVE) ×1 IMPLANT
GOWN STRL REUS W/ TWL LRG LVL3 (GOWN DISPOSABLE) ×2 IMPLANT
GOWN STRL REUS W/ TWL XL LVL3 (GOWN DISPOSABLE) ×1 IMPLANT
GOWN STRL REUS W/TWL LRG LVL3 (GOWN DISPOSABLE) ×2
GOWN STRL REUS W/TWL XL LVL3 (GOWN DISPOSABLE) ×1
KIT BASIN OR (CUSTOM PROCEDURE TRAY) ×1 IMPLANT
KIT SHUNT ARGYLE CAROTID ART 6 (VASCULAR PRODUCTS) IMPLANT
KIT TURNOVER KIT B (KITS) ×1 IMPLANT
NDL HYPO 25GX1X1/2 BEV (NEEDLE) IMPLANT
NEEDLE HYPO 25GX1X1/2 BEV (NEEDLE) ×1 IMPLANT
NS IRRIG 1000ML POUR BTL (IV SOLUTION) ×3 IMPLANT
PACK CAROTID (CUSTOM PROCEDURE TRAY) ×1 IMPLANT
PAD ARMBOARD 7.5X6 YLW CONV (MISCELLANEOUS) ×2 IMPLANT
PATCH VASC XENOSURE 1CMX6CM (Vascular Products) ×1 IMPLANT
PATCH VASC XENOSURE 1X6 (Vascular Products) IMPLANT
POSITIONER HEAD DONUT 9IN (MISCELLANEOUS) ×1 IMPLANT
STRIP CLOSURE SKIN 1/2X4 (GAUZE/BANDAGES/DRESSINGS) ×1 IMPLANT
SUT MNCRL AB 4-0 PS2 18 (SUTURE) ×1 IMPLANT
SUT PROLENE 6 0 BV (SUTURE) ×2 IMPLANT
SUT PROLENE 7 0 BV 1 (SUTURE) IMPLANT
SUT PROLENE BLUE 7 0 (SUTURE) IMPLANT
SUT SILK 3 0 (SUTURE)
SUT SILK 3-0 18XBRD TIE 12 (SUTURE) IMPLANT
SUT VIC AB 2-0 CT1 27 (SUTURE) ×1
SUT VIC AB 2-0 CT1 TAPERPNT 27 (SUTURE) ×1 IMPLANT
SUT VIC AB 3-0 SH 27 (SUTURE) ×1
SUT VIC AB 3-0 SH 27X BRD (SUTURE) ×1 IMPLANT
SYR CONTROL 10ML LL (SYRINGE) IMPLANT
TOWEL GREEN STERILE (TOWEL DISPOSABLE) ×1 IMPLANT
WATER STERILE IRR 1000ML POUR (IV SOLUTION) ×1 IMPLANT

## 2022-05-15 NOTE — Anesthesia Postprocedure Evaluation (Signed)
Anesthesia Post Note  Patient: Robert Wise  Procedure(s) Performed: RIGHT ENDARTERECTOMY CAROTID (Right: Neck) BOVINE PATCH ANGIOPLASTY OF RIGHT CAROTID ARTERY (Right: Neck)     Patient location during evaluation: PACU Anesthesia Type: General Level of consciousness: awake and alert Pain management: pain level controlled Vital Signs Assessment: post-procedure vital signs reviewed and stable Respiratory status: spontaneous breathing, nonlabored ventilation, respiratory function stable and patient connected to nasal cannula oxygen Cardiovascular status: blood pressure returned to baseline and stable Postop Assessment: no apparent nausea or vomiting Anesthetic complications: no   No notable events documented.  Last Vitals:  Vitals:   05/15/22 1345 05/15/22 1400  BP:  111/64  Pulse: (!) 50 (!) 51  Resp: 14 16  Temp:  36.4 C  SpO2: 92% 95%    Last Pain:  Vitals:   05/15/22 1400  TempSrc: Oral  PainSc: 0-No pain                 Belenda Cruise P Malachi Suderman

## 2022-05-15 NOTE — Anesthesia Procedure Notes (Signed)
Arterial Line Insertion Start/End2/14/2024 8:05 AM, 05/15/2022 8:10 AM Performed by: Babs Bertin, CRNA, CRNA  Preanesthetic checklist: patient identified, IV checked and risks and benefits discussed Lidocaine 1% used for infiltration Left, radial was placed Catheter size: 20 G Hand hygiene performed  and maximum sterile barriers used   Attempts: 1 Procedure performed without using ultrasound guided technique. Ultrasound Notes:anatomy identified Following insertion, Biopatch.

## 2022-05-15 NOTE — Op Note (Signed)
DATE OF SERVICE: 05/15/2022  PATIENT:  Robert Wise  62 y.o. male  PRE-OPERATIVE DIAGNOSIS:  right critical asymptomatic carotid artery stenosis  POST-OPERATIVE DIAGNOSIS:  Same  PROCEDURE:   Right carotid endarterectomy with bovine pericardial patch angioplasty  SURGEON:  Surgeon(s) and Role:    * Cherre Robins, MD - Primary  ASSISTANT: Leontine Locket, PA-C  An experienced assistant was required given the complexity of this procedure and the standard of surgical care. My assistant helped with exposure through counter tension, suctioning, ligation and retraction to better visualize the surgical field.  My assistant expedited sewing during the case by following my sutures. Wherever I use the term "we" in the report, my assistant actively helped me with that portion of the procedure.  ANESTHESIA:   general  EBL: 78m  BLOOD ADMINISTERED:none  DRAINS: none   LOCAL MEDICATIONS USED:  NONE  SPECIMEN:  none  COUNTS: confirmed correct.  TOURNIQUET:  none  PATIENT DISPOSITION:  PACU - hemodynamically stable.   Delay start of Pharmacological VTE agent (>24hrs) due to surgical blood loss or risk of bleeding: no  INDICATION FOR PROCEDURE: DJALEEL COBAUGHis a 62y.o. male with critical, asymptomatic carotid artery stenosis. After careful discussion of risks, benefits, and alternatives the patient was offered carotid endarterectomy. We specifically discussed risk of stroke, cranial nerve injury, hematoma. The patient understood and wished to proceed.  OPERATIVE FINDINGS: unremarkable carotid endarterectomy. Bulky, critical stenosis confirmed on exploration. Good technical result achieved. Normal doppler exam at completion. Duplex shows no technical problems at endarterectomy. Patient awoke neurologically normal, moving all four extremities, with midline tongue and clear voice.  DESCRIPTION OF PROCEDURE: After identification of the patient in the pre-operative holding area, the  patient was transferred to the operating room. The patient was positioned supine on the operating room table. Anesthesia was induced. The right neck was prepped and draped in standard fashion. A surgical pause was performed confirming correct patient, procedure, and operative location.  The neck was evaluated with intraoperative duplex.  The plaque was marked on the skin.  An incision was made over the anterior aspect of the sternocleidomastoid on the right neck.  Incision was carried down through subtenons tissue until the platysma is identified.  The platysma was incised with Bovie electrocautery.  An avascular plane was developed over the anterior surface of the sternocleidomastoid.  The external jugular vein was identified and divided as it was in our way.  The carotid sheath was identified.    The carotid sheath was entered sharply and carefully.  The jugular vein was skeletonized throughout the length of the exposure.  The facial vein was identified, encircled with silk suture, ligated, and divided.  The vagus nerve was then identified and protected throughout the case.  The common carotid artery was identified.  The common carotid artery and its bifurcation was then skeletonized.  Exposure was carried out cranially on the internal carotid artery.    The patient was systemically heparinized.  Activated clotting time measurements were used throughout the case to confirm adequate anticoagulation.    Once adequate exposure of the carotid artery and its bifurcation was obtained, the internal carotid artery, external carotid artery, and common carotid artery were encircled.  After careful communication with anesthesia, we began clamping the carotid arteries.  The internal carotid artery was clamped first, followed by the common carotid artery, followed by the external carotid artery.    An anterior arteriotomy was made on the common carotid artery extending onto  the internal carotid artery past the area  of stenosis.  Severe, near occlusive plaque was encountered.  I tested the backbleeding from the internal carotid artery and found it to be robust.  I elected to not use a shunt.  The carotid artery was then endarterectomized using a Primary school teacher.  Eversion endarterectomy was performed on the external carotid artery.  An excellent endpoint was achieved.  Similarly, a good endpoint was achieved on the internal carotid artery.  The common carotid artery was heavily diseased and not clampable except for the area immediately proximal to the carotid bifurcation.  The endarterectomy plane was amputated at this point with a Beaver blade.  The endarterectomy plane was then carefully inspected under loupe magnification for any debris or flaps.  These were removed when identified.  The distal endpoint was tacked with two 7-0 Prolene sutures.  A bovine pericardial patch was prepared per manufacturer's instructions and brought onto the field.  I narrowed the patch and shorten it to allow patch angioplasty of the arteriotomy.  This was performed using continuous running suture of 6-0 Prolene.  Immediately prior to completion of the repair was flushed with heparinized saline.  The external carotid artery was then released and backbleeding was confirmed.  The repair was then completed.  Hemostasis was achieved.  Clamps were then released on the external carotid artery, followed by common carotid artery, followed by internal carotid artery.  Careful communication with anesthesia was again performed.  Patient tolerated this well.  The repair was evaluated with Doppler machine.  Continuous diastolic flow was heard throughout the internal carotid artery confirming good outflow.  A normal, high resistance waveform was heard in the external carotid artery.  A mixed signal was heard in the common carotid artery both proximal and distal to our clamp site.  The repair was then evaluated with duplex.  No technical deficit noted at  the proximal endpoint, endarterectomy plane, or distal endpoint.  Heparin was reversed with protamine.  Hemostasis was again confirmed in the patch and in the surgical wound.  The wound was closed by reapproximate the platysma with 3-0 Vicryl.  The skin was closed with a subcuticular stitch.  Steri-Strips were applied to the skin.  At bandage was applied to the neck.  The patient awoke neurologically intact.  The patient moves all 4 extremities to command.  Patient had a midline tongue.  The patient had clear voice.  Upon completion of the case instrument and sharps counts were confirmed correct. The patient was transferred to the PACU in good condition. I was present for all portions of the procedure.  Yevonne Aline. Stanford Breed, MD [facility] Phone Number: [phone] 05/15/2022 11:21 AM

## 2022-05-15 NOTE — Progress Notes (Signed)
  Day of Surgery Note    Subjective:  no complaints; pt's left eye with redness that was present last night before surgery per the pt.    Vitals:   05/15/22 1103 05/15/22 1113  BP:  131/67  Pulse: 68 73  Resp: 13 18  Temp:    SpO2: 95% 96%    Incisions:   bandage is clean and dry Neuro:  moving all extremities equally; tongue is midline Cardiac:  regular Lungs:  non labored    Assessment/Plan:  This is a 62 y.o. male who is s/p  Right CEA  -pt doing well in recovery and neuro in tact -to [place] later this afternoon -if uneventful evening, most likely home tomorrow.  -continue asa/statin/plavix -SSI for DM   Leontine Locket, PA-C 05/15/2022 11:18 AM [phone]

## 2022-05-15 NOTE — H&P (Signed)
[facility] [place]  ASSESSMENT / PLAN: 62 y.o. male with with asymptomatic bilateral carotid artery stenosis (R ~80% > L 25%).    The patient should continue best medical therapy for carotid artery stenosis including: Complete cessation from all tobacco products. Blood glucose control with goal A1c < 7%. Blood pressure control with goal blood pressure < 140/90 mmHg. Lipid reduction therapy with goal LDL-C <100 mg/dL (<70 if symptomatic from carotid artery stenosis).  Aspirin 24m PO QD.  Clopidogrel 743mPO QD. Atorvastatin 40-80mg PO QD (or other "high intensity" statin therapy).   CT angiogram shows suitable anatomy for either endarterectomy or TCAR.  I counseled the patient extensively about risks, benefits, and alternatives to various treatment strategies for asymptomatic critical carotid stenosis.  Ultimately, I recommended a TCAR for the patient.    His insurance company refused to pay for a TCAR despite my efforts at peer-to-peer discussion. I reviewed the new CMS guidelines and reviewed the [facility]. His insurer would still not pay for this therapy.  I had a discussion regarding the above. His anatomy is suitable for CEA. He wanted to proceed with CEA. Will do this in the OR today.  CHIEF COMPLAINT: asymptomatic carotid artery stenosis   HISTORY OF PRESENT ILLNESS: Robert SPAGNOLIs a [age].o. male referred to clinic by Dr. GaEinar Gipor evaluation of carotid artery stenosis.  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 No history of neck radiation. No history of neck surgery. No anticoagulation use.    05/15/22: patient presents for surgery. All risks / benefits / alternatives reviewed. He wished to proceed with R CEA.  VASCULAR SURGICAL  HISTORY: none   VASCULAR RISK FACTORS: Negative history of stroke / transient ischemic attack. Negative history of coronary artery disease.  Positive history of diabetes mellitus. Last A1c 7.2. Positive history of smoking. + actively smoking. Positive history of hypertension.  Negative history of chronic kidney disease.   Negative history of chronic obstructive pulmonary disease.   FUNCTIONAL STATUS: ECOG performance status: (0) Fully active, able to carry on all predisease performance without restriction Ambulatory status: Ambulatory within the community without limits   CAREY 1 AND 3 YEAR INDEX Male (2pts) 75-79 or 80-84 (2pts) >84 (3pts) Dependence in toileting (1pt) Partial or full dependence in dressing (1pt) History of malignant neoplasm (2pts) CHF (3pts) COPD (1pts) CKD (3pts)   0-3 pts 6% 1 year mortality ; 21% 3 year mortality 4-5 pts 12% 1 year mortality ; 36% 3 year mortality >5 pts 21% 1 year mortality; 54% 3 year mortality   Past Medical History:  Diagnosis Date   Anxiety    Depression    Diabetes mellitus without complication (HCC)    GERD (gastroesophageal reflux disease)    Hyperlipidemia    Hypertension    Hypothyroidism    OSA (obstructive sleep apnea) 12/15/2012   Sleep apnea    Thyroid disease     Past Surgical History:  Procedure Laterality Date   ANKLE SURGERY Left 04/01/2004   CATARACT EXTRACTION Right 04/2021   VASECTOMY      Family History  Problem Relation Age of Onset   Cancer Mother    Heart failure Sister    Sleep apnea Neg Hx     Social History  Socioeconomic History   Marital status: Married    Spouse name: [name]   Number of children: 3   Years of education: 11.5   Highest education level: Not on file  Occupational History    Employer: [employer]    Comment: Driver  Tobacco Use   Smoking status: Every Day    Packs/day: 1.50    Years: 37.00    Total pack years: 55.50    Types: Cigarettes    Start date:  04/02/2019   Smokeless tobacco: Never  Vaping Use   Vaping Use: Never used  Substance and Sexual Activity   Alcohol use: No    Comment: quit: 08/14/2011   Drug use: No   Sexual activity: Yes    Birth control/protection: None  Other Topics Concern   Not on file  Social History Narrative   Patient lives at home with family.    Caffeine Use: 1 pot of coffee daily   Social Determinants of Health   Financial Resource Strain: Not on file  Food Insecurity: Not on file  Transportation Needs: Not on file  Physical Activity: Not on file  Stress: Not on file  Social Connections: Not on file  Intimate Partner Violence: Not on file    Allergies  Allergen Reactions   Tricor [Fenofibrate] Other (See Comments)    Unk reaction   Zocor [Simvastatin] Other (See Comments)    UNK reaction    Current Facility-Administered Medications  Medication Dose Route Frequency Provider Last Rate Last Admin   0.9 %  sodium chloride infusion   Intravenous Continuous Cherre Robins, MD       ceFAZolin (ANCEF) IVPB 2g/100 mL premix  2 g Intravenous 30 min Pre-Op Cherre Robins, MD       chlorhexidine (PERIDEX) 0.12 % solution 15 mL  15 mL Mouth/Throat Once Stoltzfus, Belenda Cruise P, DO       Chlorhexidine Gluconate Cloth 2 % PADS 6 each  6 each Topical Once Cherre Robins, MD       insulin aspart (novoLOG) injection 0-14 Units  0-14 Units Subcutaneous Q2H PRN Stoltzfus, March Rummage, DO       lactated ringers infusion   Intravenous Continuous Suzette Battiest, MD       remifentanil (ULTIVA) 2 mg in 100 mL normal saline (20 mcg/mL) Optime  0.0125 mcg/kg/min Intravenous To OR Cherre Robins, MD        PHYSICAL EXAM Vitals:   05/15/22 0646  BP: 116/62  Pulse: (!) 55  Resp: 17  Temp: 98.1 F (36.7 C)  TempSrc: Oral  SpO2: 96%  Weight: 94.3 kg  Height: 6' (1.829 m)    Well appearing man in no distress Regular rate and rhythm Unlabored breathing No focal neurologic signs.    PERTINENT  LABORATORY AND RADIOLOGIC DATA  Most recent CBC    Latest Ref Rng & Units 04/29/2022    9:12 AM 06/02/2017    4:59 PM 07/28/2015    8:50 AM  CBC  WBC 4.0 - 10.5 K/uL 5.6  9.8  8.6   Hemoglobin 13.0 - 17.0 g/dL 14.0  14.7  15.5   Hematocrit 39.0 - 52.0 % 42.8  42.6  43.0   Platelets 150 - 400 K/uL 222  253       Most recent CMP    Latest Ref Rng & Units 05/06/2022    2:04 PM 04/29/2022    9:12 AM 02/05/2022    8:56 AM  CMP  Glucose 70 -  99 mg/dL 114  239  110   BUN 6 - 23 mg/dL 18  15  21   $ Creatinine 0.40 - 1.50 mg/dL 1.06  0.98  1.14   Sodium 135 - 145 mEq/L 137  135  139   Potassium 3.5 - 5.1 mEq/L 4.7  5.0  5.0   Chloride 96 - 112 mEq/L 102  100  103   CO2 19 - 32 mEq/L 22  25  20   $ Calcium 8.4 - 10.5 mg/dL 9.4  9.0  9.7   Total Protein 6.0 - 8.3 g/dL 7.8  7.1    Total Bilirubin 0.2 - 1.2 mg/dL 0.5  0.6    Alkaline Phos 39 - 117 U/L 100  106    AST 0 - 37 U/L 15  87    ALT 0 - 53 U/L 46  275      Renal function Estimated Creatinine Clearance: 87.3 mL/min (by C-G formula based on SCr of 1.06 mg/dL).  Hgb A1c MFr Bld (%)  Date Value  04/29/2022 6.6 (H)    LDL Chol Calc (NIH)  Date Value Ref Range Status  02/05/2022 76 0 - 99 mg/dL Final   Direct LDL  Date Value Ref Range Status  07/02/2021 80.0 mg/dL Final    Comment:    Optimal:  <100 mg/dLNear or Above Optimal:  100-129 mg/dLBorderline High:  130-159 mg/dLHigh:  160-189 mg/dLVery High:  >190 mg/dL    CT angiogram personally reviewed.  Severe right-sided carotid artery stenosis.  Mild left-sided carotid artery stenosis.  Appears to be candidate for either endarterectomy or TCAR.   Yevonne Aline. Stanford Breed, MD FACS [facility] Phone Number: [phone] 05/15/2022 7:54 AM   Total time spent on preparing this encounter including chart review, data review, collecting history, examining the patient, coordinating care for this established patient, 40 minutes.  Portions of this  report may have been transcribed using voice recognition software.  Every effort has been made to ensure accuracy; however, inadvertent computerized transcription errors may still be present.

## 2022-05-15 NOTE — Transfer of Care (Signed)
Immediate Anesthesia Transfer of Care Note  Patient: Robert Wise  Procedure(s) Performed: RIGHT ENDARTERECTOMY CAROTID (Right: Neck) BOVINE PATCH ANGIOPLASTY OF RIGHT CAROTID ARTERY (Right: Neck)  Patient Location: PACU  Anesthesia Type:General  Level of Consciousness: awake, alert , and oriented  Airway & Oxygen Therapy: Patient Spontanous Breathing and Patient connected to nasal cannula oxygen  Post-op Assessment: Report given to RN, Post -op Vital signs reviewed and stable, Patient moving all extremities X 4, and Patient able to stick tongue midline  Post vital signs: Reviewed and stable  Last Vitals:  Vitals Value Taken Time  BP 130/69 05/15/22 1047  Temp    Pulse 76 05/15/22 1053  Resp 14 05/15/22 1053  SpO2 90 % 05/15/22 1053  Vitals shown include unvalidated device data.  Last Pain:  Vitals:   05/15/22 0708  TempSrc:   PainSc: 0-No pain         Complications: No notable events documented.

## 2022-05-15 NOTE — Discharge Summary (Signed)
Discharge Summary     Robert Wise 1961/02/08 62 y.o. male  [id number]  Admission Date: 05/15/2022  Discharge Date: 05/16/2022  Physician: Cherre Robins, MD  Admission Diagnosis: Asymptomatic carotid artery stenosis without infarction, right [I65.21]   HPI:   This is a 62 y.o. male referred to clinic by Dr. Einar Gip for evaluation of carotid artery stenosis.  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  He does report a brief episode of right-sided weakness about 2 to 3 months ago, but no left-sided weakness, amaurosis, facial droop, dysarthria. No history of neck radiation. No history of neck surgery. No anticoagulation use.     05/15/22: patient presents for surgery. All risks / benefits / alternatives reviewed. He wished to proceed with R CEA.  Hospital Course:  The patient was admitted to the hospital and taken to the operating room on 05/15/2022 and underwent right CEA  The pt tolerated the procedure well and was transported to the PACU in good condition.   By POD 1, the pt neuro status in tact.  He has voided and ambulated and no difficulty swallowing.     Labs: CBC    Component Value Date/Time   WBC 11.2 (H) 05/16/2022 0340   RBC 4.38 05/16/2022 0340   HGB 12.7 (L) 05/16/2022 0340   HGB 14.7 06/02/2017 1659   HCT 39.0 05/16/2022 0340   HCT 42.6 06/02/2017 1659   PLT 218 05/16/2022 0340   PLT 253 06/02/2017 1659   MCV 89.0 05/16/2022 0340   MCV 81 06/02/2017 1659   MCH 29.0 05/16/2022 0340   MCHC 32.6 05/16/2022 0340   RDW 14.5 05/16/2022 0340   RDW 16.2 (H) 06/02/2017 1659   BMET    Component Value Date/Time   NA 136 05/16/2022 0340   NA 139 02/05/2022 0856   K 3.8 05/16/2022 0340   CL 100 05/16/2022 0340   CO2 26 05/16/2022 0340   GLUCOSE 78 05/16/2022 0340   BUN 14 05/16/2022 0340   BUN 21 02/05/2022 0856   CREATININE 1.06 05/16/2022 0340   CREATININE 0.72 12/29/2015 0909   CALCIUM 8.6 (L) 05/16/2022 0340    EGFR 73 02/05/2022 0856   GFRNONAA >60 05/16/2022 0340   GFRNONAA >89 07/28/2015 0847     Discharge Instructions     Discharge patient   Complete by: As directed    Discharge disposition: 01-Home or Self Care   Discharge patient date: 05/16/2022       Discharge Diagnosis:  Asymptomatic carotid artery stenosis without infarction, right [I65.21]  Secondary Diagnosis: Patient Active Problem List   Diagnosis Date Noted   Asymptomatic carotid artery stenosis without infarction, right 05/15/2022   Anxiety state 01/25/2018   Fatigue 01/25/2018   Uncontrolled type 2 diabetes mellitus with complication, without long-term current use of insulin 02/10/2015   Chronic pain 12/01/2014   Depression 10/12/2014   Past Medical History:  Diagnosis Date   Anxiety    Depression    Diabetes mellitus without complication (HCC)    GERD (gastroesophageal reflux disease)    Hyperlipidemia    Hypertension    Hypothyroidism    OSA (obstructive sleep apnea) 12/15/2012   Sleep apnea    Thyroid disease     Allergies as of 05/16/2022       Reactions   Tricor [fenofibrate] Other (See Comments)   Unk reaction   Zocor [simvastatin] Other (See Comments)   UNK reaction  Medication List     TAKE these medications    aspirin 81 MG chewable tablet Commonly known as: Aspirin Childrens Chew 1 tablet (81 mg total) by mouth daily.   atorvastatin 80 MG tablet Commonly known as: LIPITOR Take 1 tablet (80 mg total) by mouth daily.   B-COMPLEX/B-12 PO Take 1 tablet by mouth daily.   busPIRone 30 MG tablet Commonly known as: BUSPAR Take 1 tablet (30 mg total) by mouth 2 (two) times daily.   clopidogrel 75 MG tablet Commonly known as: PLAVIX Take 1 tablet (75 mg total) by mouth daily.   diazepam 10 MG tablet Commonly known as: VALIUM TAKE 1 TABLET (10 MG TOTAL) BY MOUTH AT BEDTIME AS NEEDED FOR ANXIETY What changed: See the new instructions.   esomeprazole 40 MG capsule Commonly  known as: NEXIUM Take 1 capsule (40 mg total) by mouth daily. What changed:  how much to take additional instructions   ferrous sulfate 325 (65 FE) MG tablet Take 325 mg by mouth daily with breakfast.   fish oil-omega-3 fatty acids 1000 MG capsule Take 2 g by mouth daily.   GINKGO BILOBA EXTRACT PO Take 120 mg by mouth daily.   glucose blood test strip Commonly known as: ONE TOUCH ULTRA TEST 1 each by Other route daily.   ibuprofen 200 MG tablet Commonly known as: ADVIL Take 600 mg by mouth every 6 (six) hours as needed for mild pain.   lamoTRIgine 200 MG tablet Commonly known as: LAMICTAL Take 1 tablet (200 mg total) by mouth 2 (two) times daily.   levothyroxine 150 MCG tablet Commonly known as: SYNTHROID Take 1 tablet (150 mcg total) by mouth daily before breakfast.   neomycin-bacitracin-polymyxin Oint Commonly known as: NEOSPORIN Apply 1 Application topically daily as needed for wound care.   olmesartan-hydrochlorothiazide 40-12.5 MG tablet Commonly known as: BENICAR HCT TAKE 1 TABLET BY MOUTH EVERY DAY IN THE MORNING What changed: See the new instructions.   ONE TOUCH ULTRA SYSTEM KIT w/Device Kit 1 kit by Does not apply route once.   onetouch ultrasoft lancets USE AS INSTRUCTED   oxyCODONE-acetaminophen 5-325 MG tablet Commonly known as: Percocet Take 1 tablet by mouth every 6 (six) hours as needed for severe pain.   PARoxetine 30 MG tablet Commonly known as: PAXIL TAKE 2 TABLETS BY MOUTH EVERY DAY   vitamin B-12 500 MCG tablet Commonly known as: CYANOCOBALAMIN Take 500 mcg by mouth daily.   Vitamin D 125 MCG (5000 UT) Caps Take 5,000 Units by mouth daily.   Xigduo XR 07-998 MG Tb24 Generic drug: Dapagliflozin Pro-metFORMIN ER Take 2 tablets by mouth daily.         [facility] Discharge Instructions Carotid Endarterectomy (CEA)  Please refer to the following instructions for your post-procedure care. Your  surgeon or physician assistant will discuss any changes with you.  Activity  You are encouraged to walk as much as you can. You can slowly return to normal activities but must avoid strenuous activity and heavy lifting until your doctor tell you it's OK. Avoid activities such as vacuuming or swinging a golf club. You can drive after one week if you are comfortable and you are no longer taking prescription pain medications. It is normal to feel tired for serval weeks after your surgery. It is also normal to have difficulty with sleep habits, eating, and bowel movements after surgery. These will go away with time.  Bathing/Showering  You may shower after you come  home. Do not soak in a bathtub, hot tub, or swim until the incision heals completely.  Incision Care  Shower every day. Clean your incision with mild soap and water. Pat the area dry with a clean towel. You do not need a bandage unless otherwise instructed. Do not apply any ointments or creams to your incision. You may have skin glue on your incision. Do not peel it off. It will come off on its own in about one week. Your incision may feel thickened and raised for several weeks after your surgery. This is normal and the skin will soften over time. For Men Only: It's OK to shave around the incision but do not shave the incision itself for 2 weeks. It is common to have numbness under your chin that could last for several months.  Diet  Resume your normal diet. There are no special food restrictions following this procedure. A low fat/low cholesterol diet is recommended for all patients with vascular disease. In order to heal from your surgery, it is CRITICAL to get adequate nutrition. Your body requires vitamins, minerals, and protein. Vegetables are the best source of vitamins and minerals. Vegetables also provide the perfect balance of protein. Processed food has little nutritional value, so try to avoid this.  Medications  Resume taking  all of your medications unless your doctor or physician assistant tells you not to.  If your incision is causing pain, you may take over-the- counter pain relievers such as acetaminophen (Tylenol). If you were prescribed a stronger pain medication, please be aware these medications can cause nausea and constipation.  Prevent nausea by taking the medication with a snack or meal. Avoid constipation by drinking plenty of fluids and eating foods with a high amount of fiber, such as fruits, vegetables, and grains.  Do not take Tylenol if you are taking prescription pain medications.  Follow Up  Our office will schedule a follow up appointment 2-3 weeks following discharge.  Please call us immediately for any of the following conditions  Increased pain, redness, drainage (pus) from your incision site. Fever of 101 degrees or higher. If you should develop stroke (slurred speech, difficulty swallowing, weakness on one side of your body, loss of vision) you should call 911 and go to the nearest emergency room.  Reduce your risk of vascular disease:  Stop smoking. If you would like help call [facility] at 1-800-QUIT-NOW (415)269-5874) or [place] at (228)054-2049. Manage your cholesterol Maintain a desired weight Control your diabetes Keep your blood pressure down  If you have any questions, please call the office at [phone].  Prescriptions given: 1.   Roxicet #8 No Refill  Disposition: home  Patient's condition: is Good  Follow up: 1. [facility] in 2-3 weeks for incision check on Dr. Stanford Breed clinic day.   Leontine Locket, PA-C [facility] [phone]   --- For [facility] use ---   Modified Rankin score at D/C (0-6): 0  IV medication needed for:  1. Hypertension: No 2. Hypotension: No  Post-op Complications: No  1. Post-op CVA or TIA: No  If yes: Event classification (right eye, left eye, right cortical, left cortical, verterobasilar, other): n/a  If  yes: Timing of event (intra-op, <6 hrs post-op, >=6 hrs post-op, unknown): n/a  2. CN injury: No  If yes: CN  injuried n/a  3. Myocardial infarction: No  If yes: Dx by (EKG or clinical, Troponin): n/a  4.  CHF: No  5.  Dysrhythmia (new): No  6. Wound infection: No  7. Reperfusion symptoms: No  8. Return to OR: No  If yes: return to OR for (bleeding, neurologic, other CEA incision, other): n/a  Discharge medications: Statin use:  Yes ASA use:  Yes   Beta blocker use:  No ACE-Inhibitor use:  No  ARB use:  Yes CCB use: No P2Y12 Antagonist use: Yes, [x ] Plavix, [ ]$  Plasugrel, [ ]$  Ticlopinine, [ ]$  Ticagrelor, [ ]$  Other, [ ]$  No for medical reason, [ ]$  Non-compliant, [ ]$  Not-indicated Anti-coagulant use:  No, [ ]$  Warfarin, [ ]$  Rivaroxaban, [ ]$  Dabigatran,

## 2022-05-15 NOTE — Progress Notes (Signed)
Left eye noted to be red patient voices eye was like that last night prior to surgery eye still looks the same as upon arrival to PACU

## 2022-05-15 NOTE — Discharge Instructions (Signed)
   [facility] [place]  Discharge Instructions   Carotid Surgery  Please refer to the following instructions for your post-procedure care. Your surgeon or physician assistant will discuss any changes with you.  Activity  You are encouraged to walk as much as you can. You can slowly return to normal activities but must avoid strenuous activity and heavy lifting until your doctor tell you it's okay. Avoid activities such as vacuuming or swinging a golf club. You can drive after one week if you are comfortable and you are no longer taking prescription pain medications. It is normal to feel tired for serval weeks after your surgery. It is also normal to have difficulty with sleep habits, eating, and bowel movements after surgery. These will go away with time.  Bathing/Showering  Shower daily after you go home. Do not soak in a bathtub, hot tub, or swim until the incision heals completely.  Incision Care  Shower every day. Clean your incision with mild soap and water. Pat the area dry with a clean towel. You do not need a bandage unless otherwise instructed. Do not apply any ointments or creams to your incision. You may have skin glue on your incision. Do not peel it off. It will come off on its own in about one week. Your incision may feel thickened and raised for several weeks after your surgery. This is normal and the skin will soften over time.   For Men Only: It's okay to shave around the incision but do not shave the incision itself for 2 weeks. It is common to have numbness under your chin that could last for several months.  Diet  Resume your normal diet. There are no special food restrictions following this procedure. A low fat/low cholesterol diet is recommended for all patients with vascular disease. In order to heal from your surgery, it is CRITICAL to get adequate nutrition. Your body requires vitamins, minerals, and protein. Vegetables are the best source of  vitamins and minerals. Vegetables also provide the perfect balance of protein. Processed food has little nutritional value, so try to avoid this.  Medications  Resume taking all of your medications unless your doctor or physician assistant tells you not to. If your incision is causing pain, you may take over-the- counter pain relievers such as acetaminophen (Tylenol). If you were prescribed a stronger pain medication, please be aware these medications can cause nausea and constipation. Prevent nausea by taking the medication with a snack or meal. Avoid constipation by drinking plenty of fluids and eating foods with a high amount of fiber, such as fruits, vegetables, and grains.   Do not take Tylenol if you are taking prescription pain medications.  Follow Up  Our office will schedule a follow up appointment 2-3 weeks following discharge.  Please call us immediately for any of the following conditions  . Increased pain, redness, drainage (pus) from your incision site. . Fever of 101 degrees or higher. . If you should develop stroke (slurred speech, difficulty swallowing, weakness on one side of your body, loss of vision) you should call 911 and go to the nearest emergency room. .  Reduce your risk of vascular disease:  . Stop smoking. If you would like help call [facility] at 1-800-QUIT-NOW (1-800-784-8669) or [place] at 336-586-4000. . Manage your cholesterol . Maintain a desired weight . Control your diabetes . Keep your blood pressure down .  If you have any questions, please call the office at [phone]. 

## 2022-05-15 NOTE — Anesthesia Procedure Notes (Signed)
Procedure Name: Intubation Date/Time: 05/15/2022 8:37 AM  Performed by: Babs Bertin, CRNAPre-anesthesia Checklist: Patient identified, Emergency Drugs available, Suction available and Patient being monitored Patient Re-evaluated:Patient Re-evaluated prior to induction Oxygen Delivery Method: Circle system utilized Preoxygenation: Pre-oxygenation with 100% oxygen Induction Type: IV induction Ventilation: Mask ventilation without difficulty Laryngoscope Size: Mac and 3 Grade View: Grade I Tube type: Oral Number of attempts: 1 Airway Equipment and Method: Stylet, Oral airway and LTA kit utilized Placement Confirmation: ETT inserted through vocal cords under direct vision, positive ETCO2 and breath sounds checked- equal and bilateral Secured at: 22 cm Tube secured with: Tape Dental Injury: Teeth and Oropharynx as per pre-operative assessment

## 2022-05-16 LAB — BASIC METABOLIC PANEL
Anion gap: 10 (ref 5–15)
BUN: 14 mg/dL (ref 8–23)
CO2: 26 mmol/L (ref 22–32)
Calcium: 8.6 mg/dL — ABNORMAL LOW (ref 8.9–10.3)
Chloride: 100 mmol/L (ref 98–111)
Creatinine, Ser: 1.06 mg/dL (ref 0.61–1.24)
GFR, Estimated: >60 mL/min (ref >60)
Glucose, Bld: 78 mg/dL (ref 70–99)
Potassium: 3.8 mmol/L (ref 3.5–5.1)
Sodium: 136 mmol/L (ref 135–145)

## 2022-05-16 LAB — GLUCOSE, CAPILLARY: Glucose-Capillary: 95 mg/dL (ref 70–99)

## 2022-05-16 LAB — CBC
HCT: 39 % (ref 39.0–52.0)
Hemoglobin: 12.7 g/dL — ABNORMAL LOW (ref 13.0–17.0)
MCH: 29 pg (ref 26.0–34.0)
MCHC: 32.6 g/dL (ref 30.0–36.0)
MCV: 89 fL (ref 80.0–100.0)
Platelets: 218 10*3/uL (ref 150–400)
RBC: 4.38 MIL/uL (ref 4.22–5.81)
RDW: 14.5 % (ref 11.5–15.5)
WBC: 11.2 10*3/uL — ABNORMAL HIGH (ref 4.0–10.5)
nRBC: 0 % (ref 0.0–0.2)

## 2022-05-16 LAB — LIPID PANEL
Cholesterol: 119 mg/dL (ref 0–200)
HDL: 20 mg/dL — ABNORMAL LOW (ref >40)
LDL Cholesterol: 62 mg/dL (ref 0–99)
Total CHOL/HDL Ratio: 6 RATIO
Triglycerides: 185 mg/dL — ABNORMAL HIGH (ref <150)
VLDL: 37 mg/dL (ref 0–40)

## 2022-05-16 MED ORDER — OXYCODONE-ACETAMINOPHEN 5-325 MG PO TABS: 1.0000 | ORAL_TABLET | Freq: Four times a day (QID) | ORAL | 0 refills | Status: DC | PRN

## 2022-05-16 NOTE — TOC Transition Note (Signed)
Transition of Care (TOC) - CM/SW Discharge Note Marvetta Gibbons RN, BSN Transitions of Care Unit 4E- RN Case Manager See Treatment Team for direct phone #   Patient Details  Name: Robert Wise MRN: [id number] Date of Birth: [birth year]  Transition of Care [facility]) CM/SW Contact:  Dawayne Patricia, RN Phone Number: 05/16/2022, 10:05 AM   Clinical Narrative:    Pt stable for transition home today. Transition of Care Department [facility]) has reviewed patient and no TOC needs have been identified, wife to transport home. Pt to follow up as per AVS instructions.   Final next level of care: Home/Self Care Barriers to Discharge: No Barriers Identified   Patient Goals and CMS Choice   Choice offered to / list presented to : NA  Discharge Placement                 Home        Discharge Plan and Services Additional resources added to the After Visit Summary for       Post Acute Care Choice: NA                               Social Determinants of Health (SDOH) Interventions SDOH Screenings   Depression (PHQ2-9): Low Risk  (05/06/2022)  Tobacco Use: High Risk (05/15/2022)     Readmission Risk Interventions    05/16/2022   10:05 AM  Readmission Risk Prevention Plan  Post Dischage Appt Complete  Medication Screening Complete  Transportation Screening Complete

## 2022-05-16 NOTE — Progress Notes (Signed)
PHARMACIST LIPID MONITORING   Robert Wise is a 62 y.o. male admitted on 05/15/2022 with Carotid Artery Stenosis.  Pharmacy has been consulted to optimize lipid-lowering therapy with the indication of secondary prevention for clinical ASCVD.  Recent Labs:  Lipid Panel (last 6 months):   Lab Results  Component Value Date   CHOL 119 05/16/2022   TRIG 185 (H) 05/16/2022   HDL 20 (L) 05/16/2022   CHOLHDL 6.0 05/16/2022   VLDL 37 05/16/2022   LDLCALC 62 05/16/2022    Hepatic function panel (last 6 months):   Lab Results  Component Value Date   AST 15 05/06/2022   ALT 46 05/06/2022   ALKPHOS 100 05/06/2022   BILITOT 0.5 05/06/2022    SCr (since admission):   Serum creatinine: 1.06 mg/dL 05/16/22 0340 Estimated creatinine clearance: 87.6 mL/min  Current therapy and lipid therapy tolerance Current lipid-lowering therapy: atorvastatin 28m  Assessment:   LDL at goal (<70) on high intensity statin PTA  Plan:    1.Statin intensity (high intensity recommended for all patients regardless of the LDL):  No statin changes. The patient is already on a high intensity statin.  2.Add ezetimibe (if any one of the following):   Not indicated at this time.  3.Refer to lipid clinic:   No  4.Follow-up with:  Primary care provider - GWendie Agreste MD  5.Follow-up labs after discharge:  No changes in lipid therapy, repeat a lipid panel in one year.      LMerrilee Jansky PharmD Clinical Pharmacist 05/16/2022, 10:13 AM

## 2022-05-16 NOTE — Progress Notes (Addendum)
  Progress Note    05/16/2022 6:42 AM 1 Day Post-Op  Subjective:  feels good and wants to go home.  He is not having any trouble swallowing.  He has ambulated and voided.   Afebrile HR 50's  009'Q-330'Q systolic 76% RA  Gtts:  none   Vitals:   05/16/22 0339 05/16/22 0400  BP: 119/66 (!) 108/59  Pulse: (!) 56 (!) 55  Resp: 20 18  Temp: 98.1 F (36.7 C) 98.3 F (36.8 C)  SpO2: 90% 92%     Physical Exam: Neuro:  in tact Lungs:  non labored Incision:  clean and dry without hematoma  CBC    Component Value Date/Time   WBC 11.2 (H) 05/16/2022 0340   RBC 4.38 05/16/2022 0340   HGB 12.7 (L) 05/16/2022 0340   HGB 14.7 06/02/2017 1659   HCT 39.0 05/16/2022 0340   HCT 42.6 06/02/2017 1659   PLT 218 05/16/2022 0340   PLT 253 06/02/2017 1659   MCV 89.0 05/16/2022 0340   MCV 81 06/02/2017 1659   MCH 29.0 05/16/2022 0340   MCHC 32.6 05/16/2022 0340   RDW 14.5 05/16/2022 0340   RDW 16.2 (H) 06/02/2017 1659    BMET    Component Value Date/Time   NA 136 05/16/2022 0340   NA 139 02/05/2022 0856   K 3.8 05/16/2022 0340   CL 100 05/16/2022 0340   CO2 26 05/16/2022 0340   GLUCOSE 78 05/16/2022 0340   BUN 14 05/16/2022 0340   BUN 21 02/05/2022 0856   CREATININE 1.06 05/16/2022 0340   CREATININE 0.72 12/29/2015 0909   CALCIUM 8.6 (L) 05/16/2022 0340   GFRNONAA >60 05/16/2022 0340   GFRNONAA >89 07/28/2015 0847   GFRAA 113 04/26/2019 0848   GFRAA >89 07/28/2015 0847     Intake/Output Summary (Last 24 hours) at 05/16/2022 0642 Last data filed at 05/16/2022 0340 Gross per 24 hour  Intake 2420 ml  Output 2350 ml  Net 70 ml     Assessment/Plan:  This is a 62 y.o. male who is s/p right CEA 1 Day Post-Op  -pt is doing well this am. -pt neuro exam is in tact -acute surgical blood loss anemia - tolerating well -pt has ambulated -pt has voided -f/u with [facility] in 4 weeks on Dr. Stanford Breed clinic day   Leontine Locket, PA-C [facility] [phone]  VASCULAR STAFF ADDENDUM: I have independently interviewed and examined the patient. I agree with the above.  Doing great s/p R CEA Follow up with me in 4 weeks with carotid duplex  Yevonne Aline. Stanford Breed, MD [facility] Phone Number: [phone] 05/16/2022 7:45 AM

## 2022-05-16 NOTE — Progress Notes (Signed)
D/c tele and IV. Went over AVS with pt and all questions were addressed.   Nayelis Bonito S Tameko Halder, RN  

## 2022-05-16 NOTE — Progress Notes (Signed)
Pt refused metformin, hydrochiorothiazide, and farxiga. Stated that he no longer took these meds. Planned to going home today. Lavenia Atlas, RN

## 2022-05-17 ENCOUNTER — Telehealth: Payer: Self-pay

## 2022-05-17 ENCOUNTER — Encounter (HOSPITAL_COMMUNITY): Payer: Self-pay | Admitting: Vascular Surgery

## 2022-05-17 ENCOUNTER — Telehealth: Payer: Self-pay | Admitting: Physician Assistant

## 2022-05-17 NOTE — Telephone Encounter (Signed)
-----   Message from Cherre Robins, MD sent at 05/15/2022 11:34 AM EST ----- [name] 05/15/2022 Procedure:  Right carotid endarterectomy with bovine pericardial patch angioplasty  Assistant: Rhyne Follow up: 4 weeks with me  Studies for follow up: carotid duplex  Thank you! Gershon Mussel

## 2022-05-17 NOTE — Transitions of Care (Post Inpatient/ED Visit) (Signed)
   05/17/2022  Name: Robert Wise MRN: [id number] DOB: January 02, 1961  Today's TOC FU Call Status: Today's TOC FU Call Status:: Successful TOC FU Call Competed TOC FU Call Complete Date: 05/17/22  Transition Care Management Follow-up Telephone Call Date of Discharge: 05/16/22 Discharge Facility: [facility] Type of Discharge: Inpatient Admission Primary Inpatient Discharge Diagnosis:: "asymptomatic carotid artery stenosis without infarction" How have you been since you were released from the hospital?: Better Any questions or concerns?: No  Items Reviewed: Did you receive and understand the discharge instructions provided?: Yes Medications obtained and verified?: Yes (Medications Reviewed) Any new allergies since your discharge?: No Dietary orders reviewed?: Yes Type of Diet Ordered:: diabetic/low salt/heart healthy Do you have support at home?: Yes People in Home: spouse Name of Support/Comfort Primary Source: [facility] and Equipment/Supplies: [place] Ordered?: No Any new equipment or medical supplies ordered?: No  Functional Questionnaire: Do you need assistance with bathing/showering or dressing?: No Do you need assistance with meal preparation?: No Do you need assistance with eating?: No Do you have difficulty maintaining continence: No Do you need assistance with getting out of bed/getting out of a chair/moving?: No Do you have difficulty managing or taking your medications?: No  Folllow up appointments reviewed: PCP Follow-up appointment confirmed?: Yes Date of PCP follow-up appointment?: 05/27/22 Follow-up Provider: Dr. Carlota Raspberry Specialist [facility] Follow-up appointment confirmed?: No (per d/c instructions-office to call patient-he is aware to follow up with office if he has has not heard from them in a few days) Reason Specialist Follow-Up Not Confirmed: Patient has Specialist Provider Number and will Call for Appointment Do you need transportation to  your follow-up appointment?: No Do you understand care options if your condition(s) worsen?: Yes-patient verbalized understanding  SDOH Interventions Today    Flowsheet Row Most Recent Value  SDOH Interventions   Food Insecurity Interventions Intervention Not Indicated  Transportation Interventions Intervention Not Indicated       Interventions Today    Flowsheet Row Most Recent Value  Education Interventions   Education Provided Provided Education  [pain mgmt, post op care, bowel regimen]  Provided Verbal Education On When to see the doctor  Nutrition Interventions   Nutrition Discussed/Reviewed Nutrition Discussed, Decreasing sugar intake, Decreasing salt  Pharmacy Interventions   Pharmacy Dicussed/Reviewed Pharmacy Topics Discussed, Medications and their functions  Safety Interventions   Safety Discussed/Reviewed Safety Discussed        TOC Interventions Today    Flowsheet Row Most Recent Value  TOC Interventions   TOC Interventions Discussed/Reviewed TOC Interventions Discussed, Post discharge activity limitations per provider, S/S of infection, Post op wound/incision care        Enzo Montgomery, RN,BSN,CCM [name] [facility] Telephonic Care Management Coordinator Direct Phone: [phone] Toll Free: [phone] Fax: [phone]

## 2022-05-17 NOTE — Telephone Encounter (Signed)
-----   Message from Gabriel Earing, [place] sent at 05/15/2022 11:19 AM EST ----- S/p right CEA 2/14.  F/u in 2-3 weeks for incision check on Dr. Stanford Breed clinic day.  Thanks

## 2022-05-22 ENCOUNTER — Telehealth: Payer: Self-pay

## 2022-05-22 NOTE — Transitions of Care (Post Inpatient/ED Visit) (Signed)
   05/22/2022  Name: Robert Wise MRN: [id number] DOB: 1961-03-24  Today's TOC FU Call Status: Today's TOC FU Call Status:: Successful TOC FU Call Competed TOC FU Call Complete Date: 05/22/22  Transition Care Management Follow-up Telephone Call Date of Discharge: 05/16/22 (Red on EMMI-ED Discharge Alert Date & Reason:05/21/22-"Sad, hopeless, anxious or empty? Yes"  Reviewed and addressed red alert with pt-he reports false alarm-he did not understand automated questions and process for responding) Discharge [facility]: [facility]) Type of Discharge: Inpatient Admission Full TOC call and assessment completed on 05/17/22-no changes noted Items Reviewed:    Home Care and Equipment/Supplies:    Functional Questionnaire:    Folllow up appointments reviewed:      Hetty Blend [facility] Care Management Care Management Community Coordinator Direct Phone: [phone] Toll Free: [phone] Fax: [phone]

## 2022-05-24 ENCOUNTER — Encounter: Payer: Self-pay | Admitting: Vascular Surgery

## 2022-05-27 ENCOUNTER — Other Ambulatory Visit: Payer: Self-pay | Admitting: Family Medicine

## 2022-05-27 ENCOUNTER — Encounter: Payer: Self-pay | Admitting: Family Medicine

## 2022-05-27 ENCOUNTER — Ambulatory Visit (INDEPENDENT_AMBULATORY_CARE_PROVIDER_SITE_OTHER): Payer: BC Managed Care – PPO | Admitting: Family Medicine

## 2022-05-27 VITALS — BP 124/68 | HR 64 | Temp 98.7°F | Ht 72.0 in | Wt 211.2 lb

## 2022-05-27 DIAGNOSIS — Z9889 Other specified postprocedural states: Secondary | ICD-10-CM | POA: Diagnosis not present

## 2022-05-27 DIAGNOSIS — E1165 Type 2 diabetes mellitus with hyperglycemia: Secondary | ICD-10-CM

## 2022-05-27 DIAGNOSIS — R7989 Other specified abnormal findings of blood chemistry: Secondary | ICD-10-CM | POA: Diagnosis not present

## 2022-05-27 DIAGNOSIS — D62 Acute posthemorrhagic anemia: Secondary | ICD-10-CM

## 2022-05-27 LAB — COMPREHENSIVE METABOLIC PANEL
ALT: 184 U/L — ABNORMAL HIGH (ref 0–53)
AST: 70 U/L — ABNORMAL HIGH (ref 0–37)
Albumin: 4.3 g/dL (ref 3.5–5.2)
Alkaline Phosphatase: 97 U/L (ref 39–117)
BUN: 21 mg/dL (ref 6–23)
CO2: 25 mEq/L (ref 19–32)
Calcium: 9.9 mg/dL (ref 8.4–10.5)
Chloride: 103 mEq/L (ref 96–112)
Creatinine, Ser: 0.97 mg/dL (ref 0.40–1.50)
GFR: 84.12 mL/min (ref >60.00)
Glucose, Bld: 152 mg/dL — ABNORMAL HIGH (ref 70–99)
Potassium: 4.4 mEq/L (ref 3.5–5.1)
Sodium: 138 mEq/L (ref 135–145)
Total Bilirubin: 0.4 mg/dL (ref 0.2–1.2)
Total Protein: 7.5 g/dL (ref 6.0–8.3)

## 2022-05-27 LAB — CBC
HCT: 40.7 % (ref 39.0–52.0)
Hemoglobin: 14.1 g/dL (ref 13.0–17.0)
MCHC: 34.7 g/dL (ref 30.0–36.0)
MCV: 86.3 fl (ref 78.0–100.0)
Platelets: 245 10*3/uL (ref 150.0–400.0)
RBC: 4.71 Mil/uL (ref 4.22–5.81)
RDW: 15.1 % (ref 11.5–15.5)
WBC: 6.8 10*3/uL (ref 4.0–10.5)

## 2022-05-27 NOTE — Patient Instructions (Signed)
Glad you are doing well.  I will check the blood counts and liver test again today but I expect those to be okay and improving from your surgery.  Keep follow-up with your surgeon as planned.  Follow-up with me in 2 months for diabetes but let me know if there are any questions in the meantime.  Take care.

## 2022-05-27 NOTE — Progress Notes (Signed)
Subjective:  Patient ID: Robert Wise, male    DOB: 1960-04-23  Age: 62 y.o. MRN: [id number]  CC:  Chief Complaint  Patient presents with   Labs Only    Pt here for follow up on labs     HPI Robert Wise presents for  Transition of care. TOC phone call noted from 05/17/2022, after hospital discharge on 05/16/2022.  Support in place at home and no needs identified.  Admitted 05/15/2022 through 05/16/2022 for asymptomatic carotid artery stenosis without infarction Carotid endarterectomy on 05/15/2022. No stent.  Acute surgical blood loss anemia.  12.7 down from 14.0 prior 4-week follow-up with Dr. Stanford Breed planned, with duplex planned at that time. Feels good. Eating and drinking ok.  Firm at surgery site, no redness or discharge. Tape came off yesterday.  No pain meds needed now. Slight soreness now.  No HA, weakness, vision changes. No difficulty swallowing.   Lab Results  Component Value Date   WBC 11.2 (H) 05/16/2022   HGB 12.7 (L) 05/16/2022   HCT 39.0 05/16/2022   MCV 89.0 05/16/2022   PLT 218 05/16/2022    Elevated LFTs Noted on preop labs.  Repeat testing was reassuring prior to his carotid endarterectomy.  Negative acute hepatitis panel. Lab Results  Component Value Date   ALT 46 05/06/2022   AST 15 05/06/2022   ALKPHOS 100 05/06/2022   BILITOT 0.5 05/06/2022   Diabetes: With prior hyperglycemia, controlled with diet only.  Home readings 130-140.   Lab Results  Component Value Date   HGBA1C 6.6 (H) 04/29/2022   HGBA1C 7.2 (H) 02/25/2022   HGBA1C 7.1 (A) 11/05/2021   Lab Results  Component Value Date   MICROALBUR 3.7 (H) 07/02/2021   Robert Wise 62 05/16/2022   CREATININE 1.06 05/16/2022         History Patient Active Problem List   Diagnosis Date Noted   Asymptomatic carotid artery stenosis without infarction, right 05/15/2022   Anxiety state 01/25/2018   Fatigue 01/25/2018   Uncontrolled type 2 diabetes mellitus with complication, without  long-term current use of insulin 02/10/2015   Chronic pain 12/01/2014   Depression 10/12/2014   Past Medical History:  Diagnosis Date   Anxiety    Depression    Diabetes mellitus without complication (HCC)    GERD (gastroesophageal reflux disease)    Hyperlipidemia    Hypertension    Hypothyroidism    OSA (obstructive sleep apnea) 12/15/2012   Sleep apnea    Thyroid disease    Past Surgical History:  Procedure Laterality Date   ANKLE SURGERY Left 04/01/2004   CATARACT EXTRACTION Right 04/2021   ENDARTERECTOMY Right 05/15/2022   Procedure: RIGHT ENDARTERECTOMY CAROTID;  Surgeon: Cherre Robins, MD;  Location: [facility];  Service: Vascular;  Laterality: Right;   PATCH ANGIOPLASTY Right 05/15/2022   Procedure: BOVINE PATCH ANGIOPLASTY OF RIGHT CAROTID ARTERY;  Surgeon: Cherre Robins, MD;  Location: [facility] OR;  Service: Vascular;  Laterality: Right;   VASECTOMY     Allergies  Allergen Reactions   Tricor [Fenofibrate] Other (See Comments)    Unk reaction   Zocor [Simvastatin] Other (See Comments)    UNK reaction   Prior to Admission medications   Medication Sig Start Date End Date Taking? Authorizing Provider  aspirin (ASPIRIN CHILDRENS) 81 MG chewable tablet Chew 1 tablet (81 mg total) by mouth daily. 12/06/20  Yes Adrian Prows, MD  atorvastatin (LIPITOR) 80 MG tablet Take 1 tablet (80 mg total) by mouth daily. 02/25/22  Yes [name], MD  B Complex Vitamins (B-COMPLEX/B-12 PO) Take 1 tablet by mouth daily.   Yes [provider]  Blood Glucose Monitoring Suppl (ONE TOUCH ULTRA SYSTEM KIT) W/DEVICE KIT 1 kit by Does not apply route once. 10/25/11  Yes Weber, Sarah L, PA-C  busPIRone (BUSPAR) 30 MG tablet Take 1 tablet (30 mg total) by mouth 2 (two) times daily. 03/04/22  Yes Hurst, Dorothea Glassman, PA-C  Cholecalciferol (VITAMIN D) 125 MCG (5000 UT) CAPS Take 5,000 Units by mouth daily.   Yes [provider]  clopidogrel (PLAVIX) 75 MG tablet Take 1 tablet (75 mg total)  by mouth daily. 03/12/22  Yes Cherre Robins, MD  diazepam (VALIUM) 10 MG tablet TAKE 1 TABLET (10 MG TOTAL) BY MOUTH AT BEDTIME AS NEEDED FOR ANXIETY Patient taking differently: Take 10 mg by mouth at bedtime as needed for anxiety. 03/22/22  Yes Hurst, Dorothea Glassman, PA-C  esomeprazole (NEXIUM) 40 MG capsule Take 1 capsule (40 mg total) by mouth daily. Patient taking differently: Take 20 mg by mouth daily. otc 04/11/17  Yes Jaynee Eagles, PA-C  ferrous sulfate 325 (65 FE) MG tablet Take 325 mg by mouth daily with breakfast.   Yes [provider]  fish oil-omega-3 fatty acids 1000 MG capsule Take 2 g by mouth daily.   Yes [provider]  GINKGO BILOBA EXTRACT PO Take 120 mg by mouth daily.   Yes [provider]  glucose blood (ONE TOUCH ULTRA TEST) test strip 1 each by Other route daily. 12/01/14  Yes Jaynee Eagles, PA-C  ibuprofen (ADVIL) 200 MG tablet Take 600 mg by mouth every 6 (six) hours as needed for mild pain.   Yes [provider]  lamoTRIgine (LAMICTAL) 200 MG tablet Take 1 tablet (200 mg total) by mouth 2 (two) times daily. 03/04/22  Yes Donnal Moat T, PA-C  Lancets [facility] ULTRASOFT) lancets USE AS INSTRUCTED 11/28/13  Yes [name], MD  levothyroxine (SYNTHROID) 150 MCG tablet Take 1 tablet (150 mcg total) by mouth daily before breakfast. 02/27/22  Yes [name], MD  neomycin-bacitracin-polymyxin (NEOSPORIN) OINT Apply 1 Application topically daily as needed for wound care.   Yes [provider]  olmesartan-hydrochlorothiazide (BENICAR HCT) 40-12.5 MG tablet TAKE 1 TABLET BY MOUTH EVERY DAY IN THE MORNING Patient taking differently: Take 1 tablet by mouth daily. 03/22/22  Yes Adrian Prows, MD  oxyCODONE-acetaminophen (PERCOCET) 5-325 MG tablet Take 1 tablet by mouth every 6 (six) hours as needed for severe pain. 05/16/22  Yes Rhyne, Samantha J, PA-C  PARoxetine (PAXIL) 30 MG tablet TAKE 2 TABLETS BY MOUTH EVERY DAY 02/26/22  Yes [name], MD  vitamin B-12 (CYANOCOBALAMIN) 500 MCG tablet Take 500 mcg by mouth daily.   Yes [provider]  Dapagliflozin Pro-metFORMIN ER (XIGDUO XR) 07-998 MG TB24 Take 2 tablets by mouth daily. Patient not taking: Reported on 05/27/2022 02/25/22   [name], MD   Social History   Socioeconomic History   Marital status: Married    Spouse name: [name]   Number of children: 3   Years of education: 11.5   Highest education level: Not on file  Occupational History    Employer: [employer]    Comment: Driver  Tobacco Use   Smoking status: Every Day    Packs/day: 1.50    Years: 37.00    Total pack years: 55.50    Types: Cigarettes    Start date: 04/02/2019   Smokeless  tobacco: Never  Vaping Use   Vaping Use: Never used  Substance and Sexual Activity   Alcohol use: No    Comment: quit: 08/14/2011   Drug use: No   Sexual activity: Yes    Birth control/protection: None  Other Topics Concern   Not on file  Social History Narrative   Patient lives at home with family.    Caffeine Use: 1 pot of coffee daily   Social Determinants of Health   Financial Resource Strain: Not on file  Food Insecurity: No Food Insecurity (05/17/2022)   Hunger Vital Sign    Worried About Running Out of Food in the Last Year: Never true    Ran Out of Food in the Last Year: Never true  Transportation Needs: No Transportation Needs (05/17/2022)   PRAPARE - Hydrologist (Medical): No    Lack of Transportation (Non-Medical): No  Physical Activity: Not on file  Stress: Not on file  Social Connections: Not on file  Intimate Partner Violence: Not on file    Review of Systems Per HPI.   Objective:   Vitals:   05/27/22 0843  BP: 124/68  Pulse: 64  Temp: 98.7 F (37.1 C)  TempSrc: Temporal  SpO2: 98%  Weight: 211 lb 3.2 oz (95.8 kg)  Height: 6' (1.829 m)     Physical Exam Vitals reviewed.  Constitutional:      Appearance: He is  well-developed.  HENT:     Head: Normocephalic and atraumatic.  Neck:     Vascular: No carotid bruit or JVD.     Comments: Healing R neck wound - see photo.  Cardiovascular:     Rate and Rhythm: Normal rate and regular rhythm.     Heart sounds: Normal heart sounds. No murmur heard. Pulmonary:     Effort: Pulmonary effort is normal.     Breath sounds: Normal breath sounds. No rales.  Musculoskeletal:     Right lower leg: No edema.     Left lower leg: No edema.  Skin:    General: Skin is warm and dry.  Neurological:     General: No focal deficit present.     Mental Status: He is alert and oriented to person, place, and time.     Cranial Nerves: No cranial nerve deficit, dysarthria or facial asymmetry.     Sensory: No sensory deficit.     Motor: No weakness or pronator drift.     Coordination: Coordination is intact.     Gait: Gait is intact. Gait normal.  Psychiatric:        Mood and Affect: Mood normal.         Assessment & Plan:  HUESTON MARGRAVE is a 62 y.o. male . History of CEA (carotid endarterectomy)  Acute blood loss anemia - Plan: CBC  Elevated LFTs - Plan: Comprehensive metabolic panel  Type 2 diabetes mellitus with hyperglycemia, without long-term current use of insulin (HCC) - Plan: Comprehensive metabolic panel  Patient of care visit from carotid endarterectomy as above on the right.  Has been doing well at home.  Pain controlled, not requiring meds at this time.  Acute blood loss anemia noted from hospitalization, denies any symptoms, expect that is improving, check CBC.  Prior elevated LFTs that improved on repeat testing, will recheck today.  Home blood sugar readings overall stable.  Plan on 45-monthfollow-up for diabetes.  Keep follow-up with surgeon as planned for follow-up CEA.  RTC/ER precautions.  No  orders of the defined types were placed in this encounter.  Patient Instructions  Glad you are doing well.  I will check the blood counts and liver  test again today but I expect those to be okay and improving from your surgery.  Keep follow-up with your surgeon as planned.  Follow-up with me in 2 months for diabetes but let me know if there are any questions in the meantime.  Take care.     Signed,   Merri Ray, MD [place], [facility] 05/27/22 9:11 AM

## 2022-05-28 ENCOUNTER — Other Ambulatory Visit: Payer: Self-pay | Admitting: *Deleted

## 2022-05-28 ENCOUNTER — Encounter: Payer: Self-pay | Admitting: Family Medicine

## 2022-05-28 DIAGNOSIS — I6523 Occlusion and stenosis of bilateral carotid arteries: Secondary | ICD-10-CM

## 2022-06-03 NOTE — Progress Notes (Unsigned)
[facility] [place]  ASSESSMENT / PLAN: Robert Wise is a 62 y.o. male with asymptomatic bilateral carotid artery stenosis (R ~80% > L 25%).   The patient should continue best medical therapy for carotid artery stenosis including: Complete cessation from all tobacco products. Blood glucose control with goal A1c < 7%. Blood pressure control with goal blood pressure < 140/90 mmHg. Lipid reduction therapy with goal LDL-C <100 mg/dL (<70 if symptomatic from carotid artery stenosis).  Aspirin '81mg'$  PO QD.  Clopidogrel '75mg'$  PO QD. Atorvastatin 40-'80mg'$  PO QD (or other "high intensity" statin therapy).  CT angiogram shows suitable anatomy for either endarterectomy or TCAR.  I counseled the patient extensively about risks, benefits, and alternatives to various treatment strategies for asymptomatic critical carotid stenosis.  Ultimately, I recommended a TCAR for the patient.  He was quoted a approximately 1 to 2% risk of stroke, a 1% risk of cranial nerve injury, and a less than 5% risk of hematoma.  He and his wife would like some time to discuss the options.  I started him on Plavix today.  He is tentatively scheduled for TCAR on Wednesday, 03/20/2022.  I will call him on Thursday 03/14/2022 to follow-up his family discussion.  CHIEF COMPLAINT: asymptomatic carotid artery stenosis  HISTORY OF PRESENT ILLNESS: Robert Wise is a 62 y.o. male surgery clinic by Dr. Einar Gip for evaluation of carotid artery stenosis.  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  He does report a brief episode of right-sided weakness about 2 to 3 months ago, but no left-sided weakness, amaurosis, facial droop, dysarthria. No history of neck radiation. No history of neck surgery. No anticoagulation use.   VASCULAR SURGICAL HISTORY: none  VASCULAR RISK FACTORS: Negative history of stroke / transient ischemic attack. Negative history of coronary artery disease.   Positive history of diabetes mellitus. Last A1c 7.2. Positive history of smoking. + actively smoking. Positive history of hypertension.  Negative history of chronic kidney disease.   Negative history of chronic obstructive pulmonary disease.  FUNCTIONAL STATUS: ECOG performance status: (0) Fully active, able to carry on all predisease performance without restriction Ambulatory status: Ambulatory within the community without limits  CAREY 1 AND 3 YEAR INDEX Male (2pts) 75-79 or 80-84 (2pts) >84 (3pts) Dependence in toileting (1pt) Partial or full dependence in dressing (1pt) History of malignant neoplasm (2pts) CHF (3pts) COPD (1pts) CKD (3pts)  0-3 pts 6% 1 year mortality ; 21% 3 year mortality 4-5 pts 12% 1 year mortality ; 36% 3 year mortality >5 pts 21% 1 year mortality; 54% 3 year mortality   Past Medical History:  Diagnosis Date   Anxiety    Depression    Diabetes mellitus without complication (HCC)    GERD (gastroesophageal reflux disease)    Hyperlipidemia    Hypertension    Hypothyroidism    OSA (obstructive sleep apnea) 12/15/2012   Sleep apnea    Thyroid disease     Past Surgical History:  Procedure Laterality Date   ANKLE SURGERY Left 04/01/2004   CATARACT EXTRACTION Right 04/2021   ENDARTERECTOMY Right 05/15/2022   Procedure: RIGHT ENDARTERECTOMY CAROTID;  Surgeon: Cherre Robins, MD;  Location: [place];  Service: Vascular;  Laterality: Right;   PATCH ANGIOPLASTY Right 05/15/2022   Procedure: BOVINE PATCH ANGIOPLASTY OF RIGHT CAROTID ARTERY;  Surgeon: Cherre Robins, MD;  Location: [place];  Service: Vascular;  Laterality: Right;   VASECTOMY  Family History  Problem Relation Age of Onset   Cancer Mother    Heart failure Sister    Sleep apnea Neg Hx     Social History   Socioeconomic History   Marital status: Married    Spouse name: [name]   Number of children: 3   Years of education: 11.5   Highest education level: Not on file   Occupational History    Employer: [employer]    Comment: Driver  Tobacco Use   Smoking status: Every Day    Packs/day: 1.50    Years: 37.00    Total pack years: 55.50    Types: Cigarettes    Start date: 04/02/2019   Smokeless tobacco: Never  Vaping Use   Vaping Use: Never used  Substance and Sexual Activity   Alcohol use: No    Comment: quit: 08/14/2011   Drug use: No   Sexual activity: Yes    Birth control/protection: None  Other Topics Concern   Not on file  Social History Narrative   Patient lives at home with family.    Caffeine Use: 1 pot of coffee daily   Social Determinants of Health   Financial Resource Strain: Not on file  Food Insecurity: No Food Insecurity (05/17/2022)   Hunger Vital Sign    Worried About Running Out of Food in the Last Year: Never true    Ran Out of Food in the Last Year: Never true  Transportation Needs: No Transportation Needs (05/17/2022)   PRAPARE - Hydrologist (Medical): No    Lack of Transportation (Non-Medical): No  Physical Activity: Not on file  Stress: Not on file  Social Connections: Not on file  Intimate Partner Violence: Not on file    Allergies  Allergen Reactions   Tricor [Fenofibrate] Other (See Comments)    Unk reaction   Zocor [Simvastatin] Other (See Comments)    UNK reaction    Current Outpatient Medications  Medication Sig Dispense Refill   aspirin (ASPIRIN CHILDRENS) 81 MG chewable tablet Chew 1 tablet (81 mg total) by mouth daily.     atorvastatin (LIPITOR) 80 MG tablet Take 1 tablet (80 mg total) by mouth daily. 90 tablet 2   B Complex Vitamins (B-COMPLEX/B-12 PO) Take 1 tablet by mouth daily.     Blood Glucose Monitoring Suppl (ONE TOUCH ULTRA SYSTEM KIT) W/DEVICE KIT 1 kit by Does not apply route once. 1 each 0   busPIRone (BUSPAR) 30 MG tablet Take 1 tablet (30 mg total) by mouth 2 (two) times daily. 180 tablet 0   Cholecalciferol (VITAMIN D) 125 MCG (5000 UT) CAPS Take 5,000  Units by mouth daily.     clopidogrel (PLAVIX) 75 MG tablet Take 1 tablet (75 mg total) by mouth daily. 30 tablet 6   Dapagliflozin Pro-metFORMIN ER (XIGDUO XR) 07-998 MG TB24 Take 2 tablets by mouth daily. (Patient not taking: Reported on 05/27/2022) 180 tablet 1   diazepam (VALIUM) 10 MG tablet TAKE 1 TABLET (10 MG TOTAL) BY MOUTH AT BEDTIME AS NEEDED FOR ANXIETY (Patient taking differently: Take 10 mg by mouth at bedtime as needed for anxiety.) 30 tablet 1   esomeprazole (NEXIUM) 40 MG capsule Take 1 capsule (40 mg total) by mouth daily. (Patient taking differently: Take 20 mg by mouth daily. otc) 90 capsule 3   ferrous sulfate 325 (65 FE) MG tablet Take 325 mg by mouth daily with breakfast.     fish oil-omega-3 fatty acids 1000 MG  capsule Take 2 g by mouth daily.     GINKGO BILOBA EXTRACT PO Take 120 mg by mouth daily.     glucose blood (ONE TOUCH ULTRA TEST) test strip 1 each by Other route daily. 100 each 8   ibuprofen (ADVIL) 200 MG tablet Take 600 mg by mouth every 6 (six) hours as needed for mild pain.     lamoTRIgine (LAMICTAL) 200 MG tablet Take 1 tablet (200 mg total) by mouth 2 (two) times daily. 180 tablet 0   Lancets (ONETOUCH ULTRASOFT) lancets USE AS INSTRUCTED 100 each 0   levothyroxine (SYNTHROID) 150 MCG tablet Take 1 tablet (150 mcg total) by mouth daily before breakfast. 30 tablet 5   neomycin-bacitracin-polymyxin (NEOSPORIN) OINT Apply 1 Application topically daily as needed for wound care.     olmesartan-hydrochlorothiazide (BENICAR HCT) 40-12.5 MG tablet TAKE 1 TABLET BY MOUTH EVERY DAY IN THE MORNING (Patient taking differently: Take 1 tablet by mouth daily.) 90 tablet 2   oxyCODONE-acetaminophen (PERCOCET) 5-325 MG tablet Take 1 tablet by mouth every 6 (six) hours as needed for severe pain. 8 tablet 0   PARoxetine (PAXIL) 30 MG tablet TAKE 2 TABLETS BY MOUTH EVERY DAY 180 tablet 0   vitamin B-12 (CYANOCOBALAMIN) 500 MCG tablet Take 500 mcg by mouth daily.     No current  facility-administered medications for this visit.    PHYSICAL EXAM There were no vitals filed for this visit.   Well appearing man in no distress Regular rate and rhythm Unlabored breathing No focal neurologic signs.    PERTINENT LABORATORY AND RADIOLOGIC DATA  Most recent CBC    Latest Ref Rng & Units 05/27/2022    9:15 AM 05/16/2022    3:40 AM 04/29/2022    9:12 AM  CBC  WBC 4.0 - 10.5 K/uL 6.8  11.2  5.6   Hemoglobin 13.0 - 17.0 g/dL 14.1  12.7  14.0   Hematocrit 39.0 - 52.0 % 40.7  39.0  42.8   Platelets 150.0 - 400.0 K/uL 245.0  218  222      Most recent CMP    Latest Ref Rng & Units 05/27/2022    9:15 AM 05/16/2022    3:40 AM 05/06/2022    2:04 PM  CMP  Glucose 70 - 99 mg/dL 152  78  114   BUN 6 - 23 mg/dL '21  14  18   '$ Creatinine 0.40 - 1.50 mg/dL 0.97  1.06  1.06   Sodium 135 - 145 mEq/L 138  136  137   Potassium 3.5 - 5.1 mEq/L 4.4  3.8  4.7   Chloride 96 - 112 mEq/L 103  100  102   CO2 19 - 32 mEq/L '25  26  22   '$ Calcium 8.4 - 10.5 mg/dL 9.9  8.6  9.4   Total Protein 6.0 - 8.3 g/dL 7.5   7.8   Total Bilirubin 0.2 - 1.2 mg/dL 0.4   0.5   Alkaline Phos 39 - 117 U/L 97   100   AST 0 - 37 U/L 70   15   ALT 0 - 53 U/L 184   46     Renal function Estimated Creatinine Clearance: 96 mL/min (by C-G formula based on SCr of 0.97 mg/dL).  Hgb A1c MFr Bld (%)  Date Value  04/29/2022 6.6 (H)    LDL Chol Calc (NIH)  Date Value Ref Range Status  02/05/2022 76 0 - 99 mg/dL Final   LDL Cholesterol  Date Value Ref Range Status  05/16/2022 62 0 - 99 mg/dL Final    Comment:           Total Cholesterol/HDL:CHD Risk Coronary Heart Disease Risk Table                     Men   Women  1/2 Average Risk   3.4   3.3  Average Risk       5.0   4.4  2 X Average Risk   9.6   7.1  3 X Average Risk  23.4   11.0        Use the calculated Patient Ratio above and the CHD Risk Table to determine the patient's CHD Risk.        ATP III CLASSIFICATION (LDL):  <100     mg/dL    Optimal  100-129  mg/dL   Near or Above                    Optimal  130-159  mg/dL   Borderline  160-189  mg/dL   High  >190     mg/dL   Very High Performed at [facility] [address] [place],  [address]    Direct LDL  Date Value Ref Range Status  07/02/2021 80.0 mg/dL Final    Comment:    Optimal:  <100 mg/dLNear or Above Optimal:  100-129 mg/dLBorderline High:  130-159 mg/dLHigh:  160-189 mg/dLVery High:  >190 mg/dL     Vascular Imaging: CT angiogram personally reviewed.  Severe right-sided carotid artery stenosis.  Mild left-sided carotid artery stenosis.  Appears to be candidate for either endarterectomy or TCAR.  Yevonne Aline. Stanford Breed, MD [facility] Phone Number: [phone] 06/03/2022 9:10 PM   Total time spent on preparing this encounter including chart review, data review, collecting history, examining the patient, coordinating care for this new patient, 60 minutes.  Portions of this report may have been transcribed using voice recognition software.  Every effort has been made to ensure accuracy; however, inadvertent computerized transcription errors may still be present.

## 2022-06-04 ENCOUNTER — Ambulatory Visit (HOSPITAL_COMMUNITY)
Admission: RE | Admit: 2022-06-04 | Discharge: 2022-06-04 | Disposition: A | Discharge status: Home / Self Care | Payer: BC Managed Care – PPO | Source: Ambulatory Visit | Attending: Vascular Surgery | Admitting: Vascular Surgery

## 2022-06-04 ENCOUNTER — Ambulatory Visit (INDEPENDENT_AMBULATORY_CARE_PROVIDER_SITE_OTHER): Payer: BC Managed Care – PPO | Admitting: Vascular Surgery

## 2022-06-04 ENCOUNTER — Encounter: Payer: Self-pay | Admitting: Vascular Surgery

## 2022-06-04 VITALS — BP 108/58 | HR 59 | Resp 20 | Ht 72.0 in | Wt 214.0 lb

## 2022-06-04 DIAGNOSIS — Z9889 Other specified postprocedural states: Secondary | ICD-10-CM

## 2022-06-04 DIAGNOSIS — I6523 Occlusion and stenosis of bilateral carotid arteries: Secondary | ICD-10-CM | POA: Insufficient documentation

## 2022-06-07 ENCOUNTER — Other Ambulatory Visit: Payer: Self-pay

## 2022-06-07 DIAGNOSIS — I6523 Occlusion and stenosis of bilateral carotid arteries: Secondary | ICD-10-CM

## 2022-06-07 DIAGNOSIS — Z9889 Other specified postprocedural states: Secondary | ICD-10-CM

## 2022-06-13 ENCOUNTER — Encounter: Payer: Self-pay | Admitting: Family Medicine

## 2022-06-17 ENCOUNTER — Encounter: Payer: Self-pay | Admitting: Family Medicine

## 2022-06-17 ENCOUNTER — Ambulatory Visit (INDEPENDENT_AMBULATORY_CARE_PROVIDER_SITE_OTHER): Payer: BC Managed Care – PPO | Admitting: Family Medicine

## 2022-06-17 VITALS — BP 136/64 | HR 60 | Temp 99.0°F | Ht 72.0 in | Wt 214.0 lb

## 2022-06-17 DIAGNOSIS — R7989 Other specified abnormal findings of blood chemistry: Secondary | ICD-10-CM

## 2022-06-17 LAB — HEPATIC FUNCTION PANEL
ALT: 187 U/L — ABNORMAL HIGH (ref 0–53)
AST: 67 U/L — ABNORMAL HIGH (ref 0–37)
Albumin: 4.4 g/dL (ref 3.5–5.2)
Alkaline Phosphatase: 90 U/L (ref 39–117)
Bilirubin, Direct: 0.1 mg/dL (ref 0.0–0.3)
Total Bilirubin: 0.6 mg/dL (ref 0.2–1.2)
Total Protein: 6.8 g/dL (ref 6.0–8.3)

## 2022-06-17 NOTE — Progress Notes (Signed)
Subjective:  Patient ID: Robert Wise, male    DOB: 1961/03/21  Age: 62 y.o. MRN: [id number]  CC:  Chief Complaint  Patient presents with   Medical Management of Chronic Issues    Following up on elevated liver enzymes. He forgot to come in yesterday to get blood drawn prior to appt.   He is fasting this morning    HPI DANIAN YEOMAN presents for   Elevated LFTs Noted on preop labs prior to his carotid endarterectomy in February.  Repeat testing was reassuring prior to that procedure including negative acute hepatitis panel and improved levels on February 5.  Repeat testing on February 26 Again with elevation of AST at 70, ALT 184.  Intermittent, Tylenol, not daily, no alcohol.  No abdominal pain, nausea, vomiting, jaundice or scleral icterus. Tylenol - once per week now, rare advil.  No alcohol.  No herbal supplements. No new meds.    History Patient Active Problem List   Diagnosis Date Noted   Asymptomatic carotid artery stenosis without infarction, right 05/15/2022   Anxiety state 01/25/2018   Fatigue 01/25/2018   Uncontrolled type 2 diabetes mellitus with complication, without long-term current use of insulin 02/10/2015   Chronic pain 12/01/2014   Depression 10/12/2014   Past Medical History:  Diagnosis Date   Anxiety    Depression    Diabetes mellitus without complication (HCC)    GERD (gastroesophageal reflux disease)    Hyperlipidemia    Hypertension    Hypothyroidism    OSA (obstructive sleep apnea) 12/15/2012   Sleep apnea    Thyroid disease    Past Surgical History:  Procedure Laterality Date   ANKLE SURGERY Left 04/01/2004   CATARACT EXTRACTION Right 04/2021   ENDARTERECTOMY Right 05/15/2022   Procedure: RIGHT ENDARTERECTOMY CAROTID;  Surgeon: Cherre Robins, MD;  Location: [place];  Service: Vascular;  Laterality: Right;   PATCH ANGIOPLASTY Right 05/15/2022   Procedure: BOVINE PATCH ANGIOPLASTY OF RIGHT CAROTID ARTERY;  Surgeon: Cherre Robins,  MD;  Location: [facility];  Service: Vascular;  Laterality: Right;   VASECTOMY     Allergies  Allergen Reactions   Tricor [Fenofibrate] Other (See Comments)    Unk reaction   Zocor [Simvastatin] Other (See Comments)    UNK reaction   Prior to Admission medications   Medication Sig Start Date End Date Taking? Authorizing Provider  aspirin (ASPIRIN CHILDRENS) 81 MG chewable tablet Chew 1 tablet (81 mg total) by mouth daily. 12/06/20  Yes Adrian Prows, MD  atorvastatin (LIPITOR) 80 MG tablet Take 1 tablet (80 mg total) by mouth daily. 02/25/22  Yes [name], MD  B Complex Vitamins (B-COMPLEX/B-12 PO) Take 1 tablet by mouth daily.   Yes [provider]  Blood Glucose Monitoring Suppl (ONE TOUCH ULTRA SYSTEM KIT) W/DEVICE KIT 1 kit by Does not apply route once. 10/25/11  Yes Weber, Sarah L, PA-C  busPIRone (BUSPAR) 30 MG tablet Take 1 tablet (30 mg total) by mouth 2 (two) times daily. 03/04/22  Yes Hurst, Dorothea Glassman, PA-C  Cholecalciferol (VITAMIN D) 125 MCG (5000 UT) CAPS Take 5,000 Units by mouth daily.   Yes [provider]  Dapagliflozin Pro-metFORMIN ER (XIGDUO XR) 07-998 MG TB24 Take 2 tablets by mouth daily. 02/25/22  Yes [name], MD  esomeprazole (NEXIUM) 40 MG capsule Take 1 capsule (40 mg total) by mouth daily. Patient taking differently: Take 20 mg by mouth daily. otc 04/11/17  Yes Jaynee Eagles, PA-C  ferrous  sulfate 325 (65 FE) MG tablet Take 325 mg by mouth daily with breakfast.   Yes [provider]  fish oil-omega-3 fatty acids 1000 MG capsule Take 2 g by mouth daily.   Yes [provider]  GINKGO BILOBA EXTRACT PO Take 120 mg by mouth daily.   Yes [provider]  glucose blood (ONE TOUCH ULTRA TEST) test strip 1 each by Other route daily. 12/01/14  Yes Jaynee Eagles, PA-C  ibuprofen (ADVIL) 200 MG tablet Take 600 mg by mouth every 6 (six) hours as needed for mild pain.   Yes [provider]  lamoTRIgine (LAMICTAL) 200 MG  tablet Take 1 tablet (200 mg total) by mouth 2 (two) times daily. 03/04/22  Yes Donnal Moat T, PA-C  Lancets [facility] ULTRASOFT) lancets USE AS INSTRUCTED 11/28/13  Yes [name], MD  levothyroxine (SYNTHROID) 150 MCG tablet Take 1 tablet (150 mcg total) by mouth daily before breakfast. 02/27/22  Yes [name], MD  olmesartan-hydrochlorothiazide (BENICAR HCT) 40-12.5 MG tablet TAKE 1 TABLET BY MOUTH EVERY DAY IN THE MORNING Patient taking differently: Take 1 tablet by mouth daily. 03/22/22  Yes Adrian Prows, MD  PARoxetine (PAXIL) 30 MG tablet TAKE 2 TABLETS BY MOUTH EVERY DAY 05/27/22  Yes [name], MD  vitamin B-12 (CYANOCOBALAMIN) 500 MCG tablet Take 500 mcg by mouth daily.   Yes [provider]  clopidogrel (PLAVIX) 75 MG tablet Take 1 tablet (75 mg total) by mouth daily. Patient not taking: Reported on 06/17/2022 03/12/22   Cherre Robins, MD  diazepam (VALIUM) 10 MG tablet TAKE 1 TABLET (10 MG TOTAL) BY MOUTH AT BEDTIME AS NEEDED FOR ANXIETY Patient not taking: Reported on 06/17/2022 03/22/22   Donnal Moat T, PA-C  neomycin-bacitracin-polymyxin (NEOSPORIN) OINT Apply 1 Application topically daily as needed for wound care. Patient not taking: Reported on 06/17/2022    [provider]   Social History   Socioeconomic History   Marital status: Married    Spouse name: [name]   Number of children: 3   Years of education: 11.5   Highest education level: Not on file  Occupational History    Employer: [employer]    Comment: Driver  Tobacco Use   Smoking status: Every Day    Packs/day: 1.50    Years: 37.00    Additional pack years: 0.00    Total pack years: 55.50    Types: Cigarettes    Start date: 04/02/2019   Smokeless tobacco: Never  Vaping Use   Vaping Use: Never used  Substance and Sexual Activity   Alcohol use: No    Comment: quit: 08/14/2011   Drug use: No   Sexual activity: Yes    Birth control/protection: None  Other Topics  Concern   Not on file  Social History Narrative   Patient lives at home with family.    Caffeine Use: 1 pot of coffee daily   Social Determinants of Health   Financial Resource Strain: Not on file  Food Insecurity: No Food Insecurity (05/17/2022)   Hunger Vital Sign    Worried About Running Out of Food in the Last Year: Never true    Ran Out of Food in the Last Year: Never true  Transportation Needs: No Transportation Needs (05/17/2022)   PRAPARE - Hydrologist (Medical): No    Lack of Transportation (Non-Medical): No  Physical Activity: Not on file  Stress: Not on file  Social Connections: Not on file  Intimate Partner Violence: Not on file    Review of Systems Per HPI.   Objective:   Vitals:   06/17/22 0847  BP: 136/64  Pulse: 60  Temp: 99 F (37.2 C)  TempSrc: Temporal  SpO2: 95%  Weight: 214 lb (97.1 kg)  Height: 6' (1.829 m)     Physical Exam Vitals reviewed.  Constitutional:      Appearance: He is well-developed.  HENT:     Head: Normocephalic and atraumatic.  Eyes:     General: No scleral icterus. Neck:     Vascular: No carotid bruit or JVD.  Cardiovascular:     Rate and Rhythm: Normal rate and regular rhythm.     Heart sounds: Normal heart sounds. No murmur heard. Pulmonary:     Effort: Pulmonary effort is normal.     Breath sounds: Normal breath sounds. No rales.  Abdominal:     General: There is no distension.     Palpations: There is no mass.     Tenderness: There is no abdominal tenderness. There is no guarding or rebound.  Musculoskeletal:     Right lower leg: No edema.     Left lower leg: No edema.  Skin:    General: Skin is warm and dry.     Coloration: Skin is not jaundiced.  Neurological:     Mental Status: He is alert and oriented to person, place, and time.  Psychiatric:        Mood and Affect: Mood normal.        Assessment & Plan:  Robert Wise is a 62 y.o. male . Elevated LFTs - Plan:  Hepatic function panel Elevated readings, normalized, then returned elevated readings on most recent labs.  Asymptomatic.  Question medication effect, question Lamictal.  Repeat LFTs, consider ultrasound, or medication adjustments depending on readings.  RTC precautions.  No orders of the defined types were placed in this encounter.  There are no Patient Instructions on file for this visit.    Signed,   Merri Ray, MD [place] 06/17/22 9:14 AM

## 2022-06-17 NOTE — Patient Instructions (Signed)
I will recheck the liver test today and then depending on those results can decide if ultrasound needed or stopping one of your medications.   Will let you know once I review the results.  If any new abdominal pain, nausea, vomiting, or yellowing of the skin or eyes be seen right away.  I do not expect this to occur.  Take care!

## 2022-06-21 ENCOUNTER — Other Ambulatory Visit: Payer: Self-pay | Admitting: Family Medicine

## 2022-06-21 DIAGNOSIS — R7989 Other specified abnormal findings of blood chemistry: Secondary | ICD-10-CM

## 2022-06-21 NOTE — Progress Notes (Signed)
See lab results.  

## 2022-06-24 ENCOUNTER — Ambulatory Visit (HOSPITAL_BASED_OUTPATIENT_CLINIC_OR_DEPARTMENT_OTHER)
Admission: RE | Admit: 2022-06-24 | Discharge: 2022-06-24 | Disposition: A | Discharge status: Home / Self Care | Payer: BC Managed Care – PPO | Source: Ambulatory Visit | Attending: Family Medicine | Admitting: Family Medicine

## 2022-06-24 DIAGNOSIS — R7989 Other specified abnormal findings of blood chemistry: Secondary | ICD-10-CM | POA: Diagnosis not present

## 2022-06-24 DIAGNOSIS — K828 Other specified diseases of gallbladder: Secondary | ICD-10-CM | POA: Diagnosis not present

## 2022-06-24 DIAGNOSIS — R945 Abnormal results of liver function studies: Secondary | ICD-10-CM | POA: Diagnosis not present

## 2022-06-25 ENCOUNTER — Other Ambulatory Visit: Payer: Self-pay | Admitting: Family Medicine

## 2022-06-25 DIAGNOSIS — R7989 Other specified abnormal findings of blood chemistry: Secondary | ICD-10-CM

## 2022-07-01 ENCOUNTER — Ambulatory Visit (INDEPENDENT_AMBULATORY_CARE_PROVIDER_SITE_OTHER): Payer: Self-pay | Admitting: Physician Assistant

## 2022-07-01 DIAGNOSIS — Z91199 Patient's noncompliance with other medical treatment and regimen due to unspecified reason: Secondary | ICD-10-CM

## 2022-07-01 NOTE — Progress Notes (Signed)
No show

## 2022-07-07 NOTE — Progress Notes (Unsigned)
GI Office Note    Referring Provider: Shade FloodGreene, Jeffrey R, MD Primary Care Physician:  Shade FloodGreene, Jeffrey R, MD  Primary Gastroenterologist: Hennie Duosharles K. Marletta Lorarver, DO  Chief Complaint   Chief Complaint  Patient presents with   Elevated Hepatic Enzymes    Patient here today due to elevated liver function test. Patient says he recently had a neck surgery at the end of January and this is when he was told he had some elevated labs.Patient denies any current gi issues.    History of Present Illness   Robert Wise is a 62 y.o. male presenting today at the request of Shade FloodGreene, Jeffrey R, MD for elevated LFTs.  LFTs elevated briefly in January 2024 with AST 87, ALT 275. Normal alk phos and T.bili. INR 1. Normal CBC. A1c 6.6.   Acute hepatitis panel negative 05/06/22.   HFP 05/27/22: AST 70, ALT 184, alk phos 90, tbili 0.4  HFP 06/17/22: AST 67, ALT 187, T.Bili 0.6, indirect 0.1,  alk phos 90   RUQ US 06/24/22: -partially contracted gallbladder, no wall thickening or stones visualized -CBD 1.547mm -liver normal, patent doppler  Any medications that are new : none. On high dose atorvastatin, metformin, lamictal, and paxil of which all are class A or B in likelihood to cause injury to the liver.  Supplements: Gingko. No other herbs or supplements.   OTC: tylenol or advil as needed.   Stopped Plavix about 1 week after his surgery (early February). He reports no issues with liver enzyme until he had started taking the Plavix.   Alcohol - none currently, heavy drinking prior to 2013 Tobacco - current everyday Jaundice - none Pruritus - none Confusion - none  Denies melena, brbrp, abdominal pain, N/V, change in appetite, unintentional weight loss.   No family history of colon cancer or colon polyps.   GERD controlled with nexium.  States last colonoscopy done about 10 years ago but unsure where this was done at.    Current Outpatient Medications  Medication Sig Dispense Refill    aspirin (ASPIRIN CHILDRENS) 81 MG chewable tablet Chew 1 tablet (81 mg total) by mouth daily.     atorvastatin (LIPITOR) 80 MG tablet Take 1 tablet (80 mg total) by mouth daily. 90 tablet 2   B Complex Vitamins (B-COMPLEX/B-12 PO) Take 1 tablet by mouth daily.     Blood Glucose Monitoring Suppl (ONE TOUCH ULTRA SYSTEM KIT) W/DEVICE KIT 1 kit by Does not apply route once. 1 each 0   busPIRone (BUSPAR) 30 MG tablet Take 1 tablet (30 mg total) by mouth 2 (two) times daily. 180 tablet 0   Cholecalciferol (VITAMIN D) 125 MCG (5000 UT) CAPS Take 5,000 Units by mouth daily.     Dapagliflozin Pro-metFORMIN ER (XIGDUO XR) 07-998 MG TB24 Take 2 tablets by mouth daily. 180 tablet 1   esomeprazole (NEXIUM) 40 MG capsule Take 1 capsule (40 mg total) by mouth daily. (Patient taking differently: Take 20 mg by mouth daily. otc) 90 capsule 3   ferrous sulfate 325 (65 FE) MG tablet Take 325 mg by mouth daily with breakfast.     fish oil-omega-3 fatty acids 1000 MG capsule Take 2 g by mouth daily.     GINKGO BILOBA EXTRACT PO Take 120 mg by mouth daily.     glucose blood (ONE TOUCH ULTRA TEST) test strip 1 each by Other route daily. 100 each 8   ibuprofen (ADVIL) 200 MG tablet Take 600 mg by mouth every 6 (  six) hours as needed for mild pain.     lamoTRIgine (LAMICTAL) 200 MG tablet Take 1 tablet (200 mg total) by mouth 2 (two) times daily. 180 tablet 0   Lancets (ONETOUCH ULTRASOFT) lancets USE AS INSTRUCTED 100 each 0   levothyroxine (SYNTHROID) 150 MCG tablet Take 1 tablet (150 mcg total) by mouth daily before breakfast. 30 tablet 5   neomycin-bacitracin-polymyxin (NEOSPORIN) OINT Apply 1 Application topically daily as needed for wound care.     olmesartan-hydrochlorothiazide (BENICAR HCT) 40-12.5 MG tablet TAKE 1 TABLET BY MOUTH EVERY DAY IN THE MORNING (Patient taking differently: Take 1 tablet by mouth daily.) 90 tablet 2   PARoxetine (PAXIL) 30 MG tablet TAKE 2 TABLETS BY MOUTH EVERY DAY 180 tablet 0   vitamin  B-12 (CYANOCOBALAMIN) 500 MCG tablet Take 500 mcg by mouth daily.     No current facility-administered medications for this visit.    Past Medical History:  Diagnosis Date   Anxiety    Depression    Diabetes mellitus without complication    GERD (gastroesophageal reflux disease)    Hyperlipidemia    Hypertension    Hypothyroidism    OSA (obstructive sleep apnea) 12/15/2012   Sleep apnea    Thyroid disease     Past Surgical History:  Procedure Laterality Date   ANKLE SURGERY Left 04/01/2004   CATARACT EXTRACTION Right 04/2021   ENDARTERECTOMY Right 05/15/2022   Procedure: RIGHT ENDARTERECTOMY CAROTID;  Surgeon: Leonie Douglas, MD;  Location: [facility];  Service: Vascular;  Laterality: Right;   PATCH ANGIOPLASTY Right 05/15/2022   Procedure: BOVINE PATCH ANGIOPLASTY OF RIGHT CAROTID ARTERY;  Surgeon: Leonie Douglas, MD;  Location: [facility] OR;  Service: Vascular;  Laterality: Right;   VASECTOMY      Family History  Problem Relation Age of Onset   Cancer Mother    Heart failure Sister    Sleep apnea Neg Hx    Liver disease Neg Hx     Allergies as of 07/08/2022 - Review Complete 07/08/2022  Allergen Reaction Noted   Tricor [fenofibrate] Other (See Comments) 08/21/2011   Zocor [simvastatin] Other (See Comments) 08/21/2011    Social History   Socioeconomic History   Marital status: Married    Spouse name: Haematologist   Number of children: 3   Years of education: 11.5   Highest education level: Not on file  Occupational History    Employer: [employer]    Comment: Driver  Tobacco Use   Smoking status: Every Day    Packs/day: 1.50    Years: 37.00    Additional pack years: 0.00    Total pack years: 55.50    Types: Cigarettes    Start date: 04/02/2019   Smokeless tobacco: Never  Vaping Use   Vaping Use: Never used  Substance and Sexual Activity   Alcohol use: No    Comment: quit: 08/14/2011   Drug use: No   Sexual activity: Yes    Birth control/protection: None  Other  Topics Concern   Not on file  Social History Narrative   Patient lives at home with family.    Caffeine Use: 1 pot of coffee daily   Social Determinants of Health   Financial Resource Strain: Not on file  Food Insecurity: No Food Insecurity (05/17/2022)   Hunger Vital Sign    Worried About Running Out of Food in the Last Year: Never true    Ran Out of Food in the Last Year: Never true  Transportation Needs:  No Transportation Needs (05/17/2022)   PRAPARE - Administrator, Civil Service (Medical): No    Lack of Transportation (Non-Medical): No  Physical Activity: Not on file  Stress: Not on file  Social Connections: Not on file  Intimate Partner Violence: Not on file     Review of Systems   Gen: Denies any fever, chills, fatigue, weight loss, lack of appetite.  CV: Denies chest pain, heart palpitations, peripheral edema, syncope.  Resp: Denies shortness of breath at rest or with exertion. Denies wheezing or cough.  GI: see HPI GU : Denies urinary burning, urinary frequency, urinary hesitancy MS: Denies joint pain, muscle weakness, cramps, or limitation of movement.  Derm: Denies rash, itching, dry skin Psych: Denies depression, anxiety, memory loss, and confusion Heme: Denies bruising, bleeding, and enlarged lymph nodes.   Physical Exam   BP 122/67 (BP Location: Left Arm, Patient Position: Sitting, Cuff Size: Large)   Pulse 62   Temp (!) 97.3 F (36.3 C) (Temporal)   Ht 6' (1.829 m)   Wt 218 lb 3.2 oz (99 kg)   BMI 29.59 kg/m   General:   Alert and oriented. Pleasant and cooperative. Well-nourished and well-developed.  Head:  Normocephalic and atraumatic. Eyes:  Without icterus, sclera clear and conjunctiva pink.  Ears:  Normal auditory acuity. Mouth:  No deformity or lesions, oral mucosa pink.  Lungs:  Clear to auscultation bilaterally. No wheezes, rales, or rhonchi. No distress.  Heart:  S1, S2 present without murmurs appreciated.  Abdomen:  +BS, soft,  non-tender and non-distended. No HSM noted. No guarding or rebound. No masses appreciated.  Rectal:  Deferred  Msk:  Symmetrical without gross deformities. Normal posture. Extremities:  Without edema. Neurologic:  Alert and  oriented x4;  grossly normal neurologically. Skin:  Intact without significant lesions or rashes. Psych:  Alert and cooperative. Normal mood and affect.   Assessment   WALE FERRELLI is a 61 y.o. male with a history of anxiety/depression, diabetes, GERD, HLD, HTN, hypothyroidism, OSA, and carotid stenosis s/p carotid endarterectomy 05/2022 presenting today for evaluation of elevated LFTs.  Elevated LFTs: Elevation in LFTs are consistent with a hepatocellular injury. Acute viral hepatitis panel negative. Given imaging findings and review of patients medications I suspect these elevations are secondary to the combination of insulting medications however would recommend full workup of potential other causes including Wilson's and autoimmune hepatitis. May need to discuss change of some of his medications with his primary care such as statin, paxil, or lamictal. Statin or Lamictal are likely culprits, however he has been on these long-term.  He states he has never had any elevated liver enzymes until he started taking Plavix for which he stopped about 1 week after his endarterectomy.  If all labs are negative we will likely recheck HFP in 8 weeks and if they are not improving or worsen then will obtain additional serologic workup checking for CMV, EBV, HSV and possibly consider liver biopsy.  Screening for colon cancer: Denies any family history of colon cancer or colon polyps.  No alarm symptoms present at this time.  Reports an remote colonoscopy about 10-15 years prior but does not recall where or who performed this.  Believes that it was normal.  I discussed the importance of performing regular colonoscopies for screening for colon cancer I offered colonoscopy.  Patient declined  at this time, will assess again at follow-up.   PLAN   ANA, ASMA, AMA, IgG, IgM, IgA, Anti liver kidney Ab,  A1A, ceruloplasmin, iron panel Heart healthy, low cholesterol diet Offered colonoscopy - patient declined Follow up TBD after labs obtained   Brooke Bonito, MSN, FNP-BC, AGACNP-BC [facility]

## 2022-07-08 ENCOUNTER — Encounter: Payer: Self-pay | Admitting: Gastroenterology

## 2022-07-08 ENCOUNTER — Ambulatory Visit (INDEPENDENT_AMBULATORY_CARE_PROVIDER_SITE_OTHER): Payer: BC Managed Care – PPO | Admitting: Gastroenterology

## 2022-07-08 VITALS — BP 122/67 | HR 62 | Temp 97.3°F | Ht 72.0 in | Wt 218.2 lb

## 2022-07-08 DIAGNOSIS — Z1211 Encounter for screening for malignant neoplasm of colon: Secondary | ICD-10-CM

## 2022-07-08 DIAGNOSIS — R7989 Other specified abnormal findings of blood chemistry: Secondary | ICD-10-CM | POA: Diagnosis not present

## 2022-07-08 NOTE — Patient Instructions (Addendum)
Please have blood work completed at [facility].   Please allow at least 3 business days for [place] to review and get back to you with any recommendations.  Continue to follow a heart healthy diet.   It was a pleasure to see you today. I want to create trusting relationships with patients. If you receive a survey regarding your visit,  I greatly appreciate you taking time to fill this out on paper or through your MyChart. I value your feedback.  Brooke Bonito, MSN, FNP-BC, AGACNP-BC [facility]

## 2022-07-09 ENCOUNTER — Encounter: Payer: Self-pay | Admitting: Gastroenterology

## 2022-07-11 ENCOUNTER — Other Ambulatory Visit: Payer: Self-pay | Admitting: Family Medicine

## 2022-07-11 DIAGNOSIS — E039 Hypothyroidism, unspecified: Secondary | ICD-10-CM

## 2022-07-12 LAB — IRON,TIBC AND FERRITIN PANEL
%SAT: 23 % (calc) (ref 20–48)
Ferritin: 119 ng/mL (ref 24–380)
Iron: 75 ug/dL (ref 50–180)
TIBC: 326 mcg/dL (calc) (ref 250–425)

## 2022-07-12 LAB — IGG, IGA, IGM
IgG (Immunoglobin G), Serum: 735 mg/dL (ref 600–1540)
IgM, Serum: 94 mg/dL (ref 50–300)
Immunoglobulin A: 389 mg/dL — ABNORMAL HIGH (ref 70–320)

## 2022-07-12 LAB — CERULOPLASMIN: Ceruloplasmin: 27 mg/dL (ref 18–36)

## 2022-07-12 LAB — MITOCHONDRIAL ANTIBODIES: Mitochondrial M2 Ab, IgG: <=20 U (ref <=20.0)

## 2022-07-12 LAB — ANTI-MICROSOMAL ANTIBODY LIVER / KIDNEY: LKM1 Ab: <=20 U (ref <=20.0)

## 2022-07-12 LAB — ALPHA-1-ANTITRYPSIN: A-1 Antitrypsin, Ser: 173 mg/dL (ref 83–199)

## 2022-07-12 LAB — ANTI-SMOOTH MUSCLE ANTIBODY, IGG: Actin (Smooth Muscle) Antibody (IGG): 22 U — ABNORMAL HIGH (ref <20)

## 2022-07-12 LAB — ANA: Anti Nuclear Antibody (ANA): NEGATIVE

## 2022-07-22 ENCOUNTER — Ambulatory Visit: Payer: BC Managed Care – PPO | Admitting: Family Medicine

## 2022-07-29 ENCOUNTER — Other Ambulatory Visit: Payer: Self-pay | Admitting: Physician Assistant

## 2022-07-31 NOTE — Telephone Encounter (Signed)
Please call to schedule, was a no show last month.

## 2022-08-02 NOTE — Telephone Encounter (Signed)
Sent MyChart message to schedule

## 2022-08-09 ENCOUNTER — Encounter: Payer: Self-pay | Admitting: Family Medicine

## 2022-08-20 ENCOUNTER — Ambulatory Visit: Payer: BC Managed Care – PPO | Admitting: Cardiology

## 2022-08-20 ENCOUNTER — Encounter: Payer: Self-pay | Admitting: Cardiology

## 2022-08-20 VITALS — BP 126/70 | HR 68 | Ht 72.0 in | Wt 214.0 lb

## 2022-08-20 DIAGNOSIS — I10 Essential (primary) hypertension: Secondary | ICD-10-CM | POA: Diagnosis not present

## 2022-08-20 DIAGNOSIS — Z9889 Other specified postprocedural states: Secondary | ICD-10-CM | POA: Insufficient documentation

## 2022-08-20 DIAGNOSIS — E782 Mixed hyperlipidemia: Secondary | ICD-10-CM

## 2022-08-20 DIAGNOSIS — I6523 Occlusion and stenosis of bilateral carotid arteries: Secondary | ICD-10-CM | POA: Diagnosis not present

## 2022-08-20 DIAGNOSIS — F17200 Nicotine dependence, unspecified, uncomplicated: Secondary | ICD-10-CM

## 2022-08-20 DIAGNOSIS — F17210 Nicotine dependence, cigarettes, uncomplicated: Secondary | ICD-10-CM | POA: Diagnosis not present

## 2022-08-20 NOTE — Progress Notes (Signed)
Primary Physician/Referring:  Shade Flood, MD  Patient ID: Robert Wise, male    DOB: November 16, 1960, 62 y.o.   MRN: [id number]  Chief Complaint  Patient presents with   Primary hypertension   Mixed hyperlipidemia   Tobacco use disorder   History of right-sided carotid endarterectomy 05/15/2022   Follow-up    HPI:    Robert Wise  is a 62 y.o. Caucasian white male with uncontrolled diabetes mellitus with peripheral neuropathy, hyperlipidemia, primary hypertension, OSA on CPAP and compliant, obesity, tobacco use disorder and who is a truck driver by profession, 21-30+ year history of smoking cigarettes and bilateral asymptomatic carotid artery stenosis S/P right CEA on 05/15/2022 presents here for a 62-month office visit.  Presently he is doing well without any complaints.  He is continuing to make changes to his diet and incorporate regular exercise daily.   He continues to smoke 1 and half packs of cigarettes per day.  Denies chest pain or dyspnea, dyspnea has remained stable.  No PND or orthopnea.  Past Medical History:  Diagnosis Date   Anxiety    Depression    Diabetes mellitus without complication (HCC)    GERD (gastroesophageal reflux disease)    Hyperlipidemia    Hypertension    Hypothyroidism    OSA (obstructive sleep apnea) 12/15/2012   Sleep apnea    Thyroid disease    Past Surgical History:  Procedure Laterality Date   ANKLE SURGERY Left 04/01/2004   CATARACT EXTRACTION Right 04/2021   ENDARTERECTOMY Right 05/15/2022   Procedure: RIGHT ENDARTERECTOMY CAROTID;  Surgeon: Leonie Douglas, MD;  Location: [facility] OR;  Service: Vascular;  Laterality: Right;   PATCH ANGIOPLASTY Right 05/15/2022   Procedure: BOVINE PATCH ANGIOPLASTY OF RIGHT CAROTID ARTERY;  Surgeon: Leonie Douglas, MD;  Location: [facility] OR;  Service: Vascular;  Laterality: Right;   VASECTOMY     Family History  Problem Relation Age of Onset   Cancer Mother    Heart failure Sister    Sleep apnea Neg  Hx    Liver disease Neg Hx     Social History   Tobacco Use   Smoking status: Every Day    Packs/day: 1.50    Years: 37.00    Additional pack years: 0.00    Total pack years: 55.50    Types: Cigarettes    Start date: 04/02/2019   Smokeless tobacco: Never  Substance Use Topics   Alcohol use: No    Comment: quit: 08/14/2011   Marital Status: Married  ROS  Review of Systems  Cardiovascular:  Negative for chest pain, dyspnea on exertion, leg swelling and syncope.  Respiratory:  Positive for snoring.   Gastrointestinal:  Negative for melena.   Objective  Blood pressure 126/70, pulse 68, height 6' (1.829 m), weight 214 lb (97.1 kg), SpO2 96 %. Body mass index is 29.02 kg/m.     08/20/2022    9:37 AM 08/20/2022    9:25 AM 07/08/2022    7:51 AM  Vitals with BMI  Height  6\' 0"    Weight  214 lbs   BMI  29.02   Systolic 126 165 865  Diastolic 70 76 67  Pulse 68 70 62    Physical Exam Constitutional:      Appearance: He is obese.  Neck:     Vascular: Carotid bruit (bilateral carotid bruit and prominant left subclavian artery bruit) present. No JVD.  Cardiovascular:     Rate and Rhythm: Normal rate and  regular rhythm.     Pulses: Normal pulses and intact distal pulses.          Carotid pulses are 2+ on the right side with bruit and 2+ on the left side with bruit.    Heart sounds: No murmur heard.    No gallop.  Pulmonary:     Effort: Pulmonary effort is normal.     Breath sounds: Normal breath sounds.  Abdominal:     General: Bowel sounds are normal.     Palpations: Abdomen is soft.  Musculoskeletal:        General: No swelling.     Laboratory examination:   Lab Results  Component Value Date   NA 138 05/27/2022   K 4.4 05/27/2022   CO2 25 05/27/2022   GLUCOSE 152 (H) 05/27/2022   BUN 21 05/27/2022   CREATININE 0.97 05/27/2022   CALCIUM 9.9 05/27/2022   EGFR 73 02/05/2022   GFRNONAA >60 05/16/2022    Recent Labs    04/29/22 0912 05/06/22 1404 05/16/22 0340  05/27/22 0915  NA 135 137 136 138  K 5.0 4.7 3.8 4.4  CL 100 102 100 103  CO2 25 22 26 25   GLUCOSE 239* 114* 78 152*  BUN 15 18 14 21   CREATININE 0.98 1.06 1.06 0.97  CALCIUM 9.0 9.4 8.6* 9.9  GFRNONAA >60  --  >60  --    CrCl cannot be calculated (Patient's most recent lab result is older than the maximum 21 days allowed.).     Latest Ref Rng & Units 06/17/2022    9:25 AM 05/27/2022    9:15 AM 05/16/2022    3:40 AM  CMP  Glucose 70 - 99 mg/dL  161  78   BUN 6 - 23 mg/dL  21  14   Creatinine 0.96 - 1.50 mg/dL  0.45  4.09   Sodium 811 - 145 mEq/L  138  136   Potassium 3.5 - 5.1 mEq/L  4.4  3.8   Chloride 96 - 112 mEq/L  103  100   CO2 19 - 32 mEq/L  25  26   Calcium 8.4 - 10.5 mg/dL  9.9  8.6   Total Protein 6.0 - 8.3 g/dL 6.8  7.5    Total Bilirubin 0.2 - 1.2 mg/dL 0.6  0.4    Alkaline Phos 39 - 117 U/L 90  97    AST 0 - 37 U/L 67  70    ALT 0 - 53 U/L 187  184        Latest Ref Rng & Units 05/27/2022    9:15 AM 05/16/2022    3:40 AM 04/29/2022    9:12 AM  CBC  WBC 4.0 - 10.5 K/uL 6.8  11.2  5.6   Hemoglobin 13.0 - 17.0 g/dL 91.4  78.2  95.6   Hematocrit 39.0 - 52.0 % 40.7  39.0  42.8   Platelets 150.0 - 400.0 K/uL 245.0  218  222     Lipid Panel Recent Labs    02/05/22 0856 05/16/22 0340  CHOL 151 119  TRIG 276* 185*  LDLCALC 76 62  VLDL  --  37  HDL 30* 20*  CHOLHDL  --  6.0   HEMOGLOBIN O1H Lab Results  Component Value Date   HGBA1C 6.6 (H) 04/29/2022   MPG 142.72 04/29/2022   TSH Recent Labs    02/25/22 1125  TSH 0.34*   Radiology:   CXR 2 view 05/29/2021: Subtle increased markings in  the medial right lower lung fields suggest subsegmental atelectasis/pneumonia. There is no pleural effusion or pneumothorax.  CT angiogram of the head and neck 02/07/2022: 1. Negative CT head. 2. 80% diameter stenosis proximal right internal carotid artery due to calcified plaque. 3. 25% diameter stenosis proximal left internal carotid artery due to calcified  plaque. 4. Mild stenosis cavernous carotid bilaterally due to atherosclerotic disease. 5. No intracranial large vessel occlusion or significant stenosis. 6. Aortic atherosclerosis.  Cardiac Studies:   PCV MYOCARDIAL PERFUSION WO LEXISCAN 12/19/2020  1 Day Rest/Stress Protocol. Stress EKG is non-diagnostic, as this is pharmacological stress test. Small size, mild intensity, fixed perfusion defect most likely secondary to soft tissue attenuation (images obtained while sitting upright and BMI >30).  Without convincing evidence of reversible myocardial ischemia or prior infarct. LVEF 56%, LV size mildly dilated, wall thickness is preserved without regional wall motion abnormalities. No prior studies available for comparison. Low risk study.   PCV ECHOCARDIOGRAM COMPLETE 12/27/2020 Narrative Echocardiogram 12/27/2020: Left ventricle cavity is normal in size. Moderate concentric hypertrophy of the left ventricle. Normal global wall motion. Normal LV systolic function with EF 68%. Doppler evidence of grade I (impaired) diastolic dysfunction, normal LAP. Left atrial cavity is borderline dilated. Trileaflet aortic valve.  Mild (Grade I) aortic regurgitation. Mild (Grade I) mitral regurgitation. Mild tricuspid regurgitation. No evidence of pulmonary hypertension.  Zio Patch Extended out patient EKG monitoring 7 days starting 12/27/2020: Predominant rhythm is normal sinus rhythm.  There were 3 brief atrial tachycardia episodes. There were occasional PACs, rare PVCs. There were no diary entries however triggered events correlated with PACs and PVCs. There was no atrial fibrillation, there was no heart block, no complex ventricular arrhythmias.    Carotid artery duplex 06/04/2022: Right Carotid: Velocities in the right ICA are consistent with a 1-39% stenosis. Right CEA patent. Left Carotid: Velocities in the left ICA are consistent with a 1-39% stenosis.  Vertebrals:  Bilateral vertebral arteries  demonstrate antegrade flow. Subclavians: Normal flow hemodynamics were seen in bilateral subclavian arteries  EKG:   EKG 08/20/2022: Normal sinus rhythm at rate of 65 bpm, normal EKG.   Medications and allergies   Allergies  Allergen Reactions   Tricor [Fenofibrate] Other (See Comments)    Unk reaction   Zocor [Simvastatin] Other (See Comments)    UNK reaction    Current Outpatient Medications:    aspirin (ASPIRIN CHILDRENS) 81 MG chewable tablet, Chew 1 tablet (81 mg total) by mouth daily., Disp: , Rfl:    atorvastatin (LIPITOR) 80 MG tablet, Take 1 tablet (80 mg total) by mouth daily., Disp: 90 tablet, Rfl: 2   B Complex Vitamins (B-COMPLEX/B-12 PO), Take 1 tablet by mouth daily., Disp: , Rfl:    Blood Glucose Monitoring Suppl (ONE TOUCH ULTRA SYSTEM KIT) W/DEVICE KIT, 1 kit by Does not apply route once., Disp: 1 each, Rfl: 0   busPIRone (BUSPAR) 30 MG tablet, Take 1 tablet (30 mg total) by mouth 2 (two) times daily., Disp: 180 tablet, Rfl: 0   Cholecalciferol (VITAMIN D) 125 MCG (5000 UT) CAPS, Take 5,000 Units by mouth daily., Disp: , Rfl:    Dapagliflozin Pro-metFORMIN ER (XIGDUO XR) 07-998 MG TB24, Take 2 tablets by mouth daily., Disp: 180 tablet, Rfl: 1   esomeprazole (NEXIUM) 40 MG capsule, Take 1 capsule (40 mg total) by mouth daily. (Patient taking differently: Take 20 mg by mouth daily. otc), Disp: 90 capsule, Rfl: 3   ferrous sulfate 325 (65 FE) MG tablet, Take 325 mg  by mouth daily with breakfast., Disp: , Rfl:    fish oil-omega-3 fatty acids 1000 MG capsule, Take 2 g by mouth daily., Disp: , Rfl:    GINKGO BILOBA EXTRACT PO, Take 120 mg by mouth daily., Disp: , Rfl:    glucose blood (ONE TOUCH ULTRA TEST) test strip, 1 each by Other route daily., Disp: 100 each, Rfl: 8   ibuprofen (ADVIL) 200 MG tablet, Take 600 mg by mouth every 6 (six) hours as needed for mild pain., Disp: , Rfl:    lamoTRIgine (LAMICTAL) 200 MG tablet, Take 1 tablet (200 mg total) by mouth 2 (two) times  daily., Disp: 180 tablet, Rfl: 0   Lancets (ONETOUCH ULTRASOFT) lancets, USE AS INSTRUCTED, Disp: 100 each, Rfl: 0   levothyroxine (SYNTHROID) 150 MCG tablet, TAKE 1 TABLET BY MOUTH DAILY BEFORE BREAKFAST., Disp: 90 tablet, Rfl: 1   neomycin-bacitracin-polymyxin (NEOSPORIN) OINT, Apply 1 Application topically daily as needed for wound care., Disp: , Rfl:    olmesartan-hydrochlorothiazide (BENICAR HCT) 40-12.5 MG tablet, TAKE 1 TABLET BY MOUTH EVERY DAY IN THE MORNING (Patient taking differently: Take 1 tablet by mouth daily.), Disp: 90 tablet, Rfl: 2   PARoxetine (PAXIL) 30 MG tablet, TAKE 2 TABLETS BY MOUTH EVERY DAY, Disp: 180 tablet, Rfl: 0   vitamin B-12 (CYANOCOBALAMIN) 500 MCG tablet, Take 500 mcg by mouth daily., Disp: , Rfl:    Assessment     ICD-10-CM   1. Primary hypertension  I10 EKG 12-Lead    2. Mixed hyperlipidemia  E78.2     3. Tobacco use disorder  F17.200     4. Asymptomatic bilateral carotid artery stenosis  I65.23 PCV CAROTID DUPLEX (BILATERAL)    5. History of right-sided carotid endarterectomy 05/15/2022  Z98.890 PCV CAROTID DUPLEX (BILATERAL)       There are no discontinued medications.    No orders of the defined types were placed in this encounter.  Orders Placed This Encounter  Procedures   EKG 12-Lead    Recommendations:   Robert Wise is a 62 y.o. Caucasian white male with uncontrolled diabetes mellitus with peripheral neuropathy, hyperlipidemia, primary hypertension, OSA on CPAP and compliant, obesity, tobacco use disorder and who is a truck driver by profession, 16-10+ year history of smoking cigarettes and bilateral asymptomatic carotid artery stenosis S/P right CEA on 05/15/2022 presents here for a 25-month office visit.  1. Primary hypertension Patient's blood pressure evaluated advised him to continue present medication).  Stable now.  2. Mixed hyperlipidemia Reviewed his external, lipids controlled continue on statins.  3. Tobacco use  disorder I did with the patient that he is now smoking about 1.5 packs of cigarettes a day, risk of local, risk of advancing risk of PVCs discussed again.  Offered him medications.  He has patches at home and will start wearing them recently.  4. Asymptomatic bilateral carotid artery stenosis Underwent successful right carotid enterectomy, scar is healed up.  I will schedule him for follow-up results of carotid artery stenosis in a year.  I would like to see him back then. - PCV CAROTID DUPLEX (BILATERAL); Future  5. History of right-sided carotid endarterectomy 05/15/2022 Dictated above. - EKG 12-Lead - PCV CAROTID DUPLEX (BILATERAL); Future   Asymptomatic bilateral carotid artery stenosis Recent CT angiogram of head and neck on 02/07/2022 revealed 80% diameter stenosis of the proximal right internal carotid artery and 25% diameter stenosis of proximal left internal carotid artery. He remains asymptomatic. We will place referral to vascular surgery for further  evaluation and treatment of right internal carotid artery.  Primary hypertension Blood pressure is well controlled.  Continue current medications without changes. Discussed the importance of low-sodium diet, exercise, and continued weight loss.  Mixed hyperlipidemia His goal LDL is <70 if not 55.  His triglycerides need to be controlled, since he has made diet changes and is losing weight I suspect this will also improve.  Smoking He continues to smoke 1-1/2 packs cigarettes/day. He is willing and ready to quit.  We discussed the importance of smoking cessation for 5 minutes.  Discussed decreasing from a pack and a half to at least 1 pack/day and continuing to decrease the amount of cigarettes he is smoking. We also discussed the use of nicotine gum and patches and at this time he would like to hold off on this.  He has tried medication in the past to assist with smoking cessation however he stopped taking the medication because he  did not like the way it made the cigarettes taste.   Follow-up in 6 months or sooner if needed.  Patient does not Dr. Meredith Staggers in a while, had abnormal TSH, his diabetes needs to be followed as well.  Patient to make appointment.    Yates Decamp, AGNP-C 08/20/2022, 10:32 AM Office: [phone]

## 2022-09-14 ENCOUNTER — Other Ambulatory Visit: Payer: Self-pay | Admitting: Family Medicine

## 2022-09-14 ENCOUNTER — Other Ambulatory Visit: Payer: Self-pay | Admitting: Vascular Surgery

## 2022-09-14 ENCOUNTER — Other Ambulatory Visit: Payer: Self-pay | Admitting: Physician Assistant

## 2022-09-19 ENCOUNTER — Telehealth: Payer: Self-pay | Admitting: Physician Assistant

## 2022-09-19 NOTE — Telephone Encounter (Signed)
Has appt. tomorrow

## 2022-09-19 NOTE — Telephone Encounter (Signed)
Pt is scheduled for 09/20/22. He needs refills on his lamictal 200 mg and his buspar 30 mg. Pharmacy is [facility] in [place]

## 2022-09-20 ENCOUNTER — Ambulatory Visit: Payer: BC Managed Care – PPO | Admitting: Physician Assistant

## 2022-09-20 ENCOUNTER — Encounter: Payer: Self-pay | Admitting: Physician Assistant

## 2022-09-20 DIAGNOSIS — F17200 Nicotine dependence, unspecified, uncomplicated: Secondary | ICD-10-CM

## 2022-09-20 DIAGNOSIS — F3341 Major depressive disorder, recurrent, in partial remission: Secondary | ICD-10-CM | POA: Diagnosis not present

## 2022-09-20 DIAGNOSIS — G2581 Restless legs syndrome: Secondary | ICD-10-CM | POA: Diagnosis not present

## 2022-09-20 DIAGNOSIS — F411 Generalized anxiety disorder: Secondary | ICD-10-CM

## 2022-09-20 DIAGNOSIS — G4709 Other insomnia: Secondary | ICD-10-CM

## 2022-09-20 DIAGNOSIS — G4733 Obstructive sleep apnea (adult) (pediatric): Secondary | ICD-10-CM

## 2022-09-20 MED ORDER — BUSPIRONE HCL 30 MG PO TABS: 30.0000 mg | ORAL_TABLET | Freq: Two times a day (BID) | ORAL | 1 refills | Status: DC

## 2022-09-20 MED ORDER — LAMOTRIGINE 200 MG PO TABS: 200.0000 mg | ORAL_TABLET | Freq: Two times a day (BID) | ORAL | 1 refills | Status: DC

## 2022-09-20 NOTE — Progress Notes (Unsigned)
[facility] Med Check  Patient ID: Robert Wise,  MRN: [id number]  PCP: Shade Flood, MD  Date of Evaluation: 09/20/2022 Time spent:30 minutes  Chief Complaint:  Chief Complaint   Anxiety; Depression; Follow-up    HISTORY/CURRENT STATUS: HPI For routine med check.      Patient denies increased energy with decreased need for sleep, increased talkativeness, racing thoughts, impulsivity or risky behaviors, increased spending, increased libido, grandiosity, increased irritability or anger, paranoia, or hallucinations.   Denies dizziness, syncope, seizures, numbness, tingling, tremor, tics, unsteady gait, falls, slurred speech, confusion. Denies muscle or joint pain, stiffness, or dystonia.  His PCP takes care of cholesterol and glucose.  Individual Medical History/ Review of Systems: Changes? 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.) Hydroxyzine caused too much sedation  Allergies: Tricor [fenofibrate] and Zocor [simvastatin]  Current Medications:  Current Outpatient Medications:    aspirin (ASPIRIN CHILDRENS) 81 MG chewable tablet, Chew 1 tablet (81 mg total) by mouth daily., Disp: , Rfl:    atorvastatin (LIPITOR) 80 MG tablet, Take 1 tablet (80 mg total) by mouth daily., Disp: 90 tablet, Rfl: 2   B Complex Vitamins (B-COMPLEX/B-12 PO), Take 1 tablet by mouth daily., Disp: , Rfl:    Blood Glucose Monitoring Suppl (ONE TOUCH ULTRA SYSTEM KIT) W/DEVICE KIT, 1 kit by Does not apply route once., Disp: 1 each, Rfl: 0   Cholecalciferol (VITAMIN D) 125 MCG (5000 UT) CAPS, Take 5,000 Units by mouth daily., Disp: , Rfl:    Dapagliflozin Pro-metFORMIN ER (XIGDUO XR) 07-998 MG TB24, Take 2 tablets by mouth daily.,  Disp: 180 tablet, Rfl: 1   esomeprazole (NEXIUM) 40 MG capsule, Take 1 capsule (40 mg total) by mouth daily. (Patient taking differently: Take 20 mg by mouth daily. otc), Disp: 90 capsule, Rfl: 3   ferrous sulfate 325 (65 FE) MG tablet, Take 325 mg by mouth daily with breakfast., Disp: , Rfl:    fish oil-omega-3 fatty acids 1000 MG capsule, Take 2 g by mouth daily., Disp: , Rfl:    GINKGO BILOBA EXTRACT PO, Take 120 mg by mouth daily., Disp: , Rfl:    glucose blood (ONE TOUCH ULTRA TEST) test strip, 1 each by Other route daily., Disp: 100 each, Rfl: 8   Lancets (ONETOUCH ULTRASOFT) lancets, USE AS INSTRUCTED, Disp: 100 each, Rfl: 0   levothyroxine (SYNTHROID) 150 MCG tablet, TAKE 1 TABLET BY MOUTH DAILY BEFORE BREAKFAST., Disp: 90 tablet, Rfl: 1   neomycin-bacitracin-polymyxin (NEOSPORIN) OINT, Apply 1 Application topically daily as needed for wound care., Disp: , Rfl:    olmesartan-hydrochlorothiazide (BENICAR HCT) 40-12.5 MG tablet, TAKE 1 TABLET BY MOUTH EVERY DAY IN THE MORNING (Patient taking differently: Take 1 tablet by mouth daily.), Disp: 90 tablet, Rfl: 2   PARoxetine (PAXIL) 30 MG tablet, TAKE 2 TABLETS BY MOUTH EVERY DAY, Disp: 180 tablet, Rfl: 0   UNABLE TO FIND, CPAP, Disp: , Rfl:    vitamin B-12 (CYANOCOBALAMIN) 500 MCG tablet, Take 500 mcg by mouth daily., Disp: , Rfl:    busPIRone (BUSPAR) 30 MG tablet, Take 1 tablet (30 mg total) by mouth 2 (two) times daily., Disp: 180 tablet, Rfl: 1   ibuprofen (ADVIL) 200 MG tablet, Take 600 mg by mouth every 6 (  six) hours as needed for mild pain., Disp: , Rfl:    lamoTRIgine (LAMICTAL) 200 MG tablet, Take 1 tablet (200 mg total) by mouth 2 (two) times daily., Disp: 180 tablet, Rfl: 1 Medication Side Effects: none  Family Medical/ Social History: Changes? No  MENTAL HEALTH EXAM:  There were no vitals taken for this visit.There is no height or weight on file to calculate BMI.  General Appearance: Casual, Neat and Well Groomed  Eye Contact:   Good  Speech:  Clear and Coherent and Normal Rate  Volume:  Normal  Mood:  Euthymic  Affect:  Appropriate  Thought Process:  Goal Directed and Descriptions of Associations: Circumstantial  Orientation:  Full (Time, Place, and Person)  Thought Content: Logical   Suicidal Thoughts:  No  Homicidal Thoughts:  No  Memory:  WNL  Judgement:  Good  Insight:  Good  Psychomotor Activity:  Normal  Concentration:  Concentration: Good and Attention Span: Fair  Recall:  Good  Fund of Knowledge: Good  Language: Good  Assets:  Desire for Improvement  ADL's:  Intact  Cognition: WNL  Prognosis:  Good   Labs 02/25/2022 Hemoglobin A1c 7.2 TSH 0.34  02/05/2022 Glucose 110 otherwise normal BMP Lipid panel triglycerides 276, HDL 30, LDL 76 otherwise normal PCP follows labs.  DIAGNOSES:  No diagnosis found.  Receiving Psychotherapy: No   RECOMMENDATIONS:  PDMP was reviewed. Valium filled 04/25/2022.  Hydrocodone and oxycodone given in small quantities twice, once in February, once in May.     Continue BuSpar 30 mg, 1 p.o. twice daily.  Continue Valium 10 mg nightly as needed.        Continue Lamictal 200 mg, 1 p.o. twice daily. Continue Paxil 30 mg, 2 p.o. daily. Continue multivitamin, ginkgo biloba, B complex, fish oil, vitamin D. Continue using CPAP. Return in 6 months.    Melony Overly, PA-C

## 2022-09-23 ENCOUNTER — Ambulatory Visit (INDEPENDENT_AMBULATORY_CARE_PROVIDER_SITE_OTHER): Payer: BC Managed Care – PPO | Admitting: Family Medicine

## 2022-09-23 VITALS — BP 138/66 | HR 65 | Temp 97.9°F | Ht 72.0 in | Wt 210.4 lb

## 2022-09-23 DIAGNOSIS — E1165 Type 2 diabetes mellitus with hyperglycemia: Secondary | ICD-10-CM | POA: Diagnosis not present

## 2022-09-23 DIAGNOSIS — I10 Essential (primary) hypertension: Secondary | ICD-10-CM | POA: Diagnosis not present

## 2022-09-23 DIAGNOSIS — E782 Mixed hyperlipidemia: Secondary | ICD-10-CM

## 2022-09-23 DIAGNOSIS — E039 Hypothyroidism, unspecified: Secondary | ICD-10-CM

## 2022-09-23 DIAGNOSIS — F32A Depression, unspecified: Secondary | ICD-10-CM

## 2022-09-23 DIAGNOSIS — Z9889 Other specified postprocedural states: Secondary | ICD-10-CM

## 2022-09-23 DIAGNOSIS — R7989 Other specified abnormal findings of blood chemistry: Secondary | ICD-10-CM

## 2022-09-23 DIAGNOSIS — Z7984 Long term (current) use of oral hypoglycemic drugs: Secondary | ICD-10-CM

## 2022-09-23 DIAGNOSIS — F17210 Nicotine dependence, cigarettes, uncomplicated: Secondary | ICD-10-CM

## 2022-09-23 LAB — COMPREHENSIVE METABOLIC PANEL
ALT: 21 U/L (ref 0–53)
AST: 16 U/L (ref 0–37)
Albumin: 4.5 g/dL (ref 3.5–5.2)
Alkaline Phosphatase: 87 U/L (ref 39–117)
BUN: 23 mg/dL (ref 6–23)
CO2: 27 mEq/L (ref 19–32)
Calcium: 10.2 mg/dL (ref 8.4–10.5)
Chloride: 100 mEq/L (ref 96–112)
Creatinine, Ser: 1.06 mg/dL (ref 0.40–1.50)
GFR: 75.45 mL/min (ref >60.00)
Glucose, Bld: 169 mg/dL — ABNORMAL HIGH (ref 70–99)
Potassium: 4.4 mEq/L (ref 3.5–5.1)
Sodium: 135 mEq/L (ref 135–145)
Total Bilirubin: 0.5 mg/dL (ref 0.2–1.2)
Total Protein: 7.9 g/dL (ref 6.0–8.3)

## 2022-09-23 LAB — LIPID PANEL
Cholesterol: 158 mg/dL (ref 0–200)
HDL: 23.6 mg/dL — ABNORMAL LOW (ref >39.00)
LDL Cholesterol: 99 mg/dL (ref 0–99)
NonHDL: 134.77
Total CHOL/HDL Ratio: 7
Triglycerides: 178 mg/dL — ABNORMAL HIGH (ref 0.0–149.0)
VLDL: 35.6 mg/dL (ref 0.0–40.0)

## 2022-09-23 LAB — HEMOGLOBIN A1C: Hgb A1c MFr Bld: 7.1 % — ABNORMAL HIGH (ref 4.6–6.5)

## 2022-09-23 LAB — TSH: TSH: 2.49 u[IU]/mL (ref 0.35–5.50)

## 2022-09-23 MED ORDER — ATORVASTATIN CALCIUM 80 MG PO TABS: 80.0000 mg | ORAL_TABLET | Freq: Every day | ORAL | 2 refills | Status: DC

## 2022-09-23 MED ORDER — DAPAGLIFLOZIN PRO-METFORMIN ER 5-1000 MG PO TB24: 2.0000 | ORAL_TABLET | Freq: Every day | ORAL | 1 refills | Status: DC

## 2022-09-23 NOTE — Progress Notes (Signed)
Subjective:  Patient ID: Robert Wise, male    DOB: 25-Aug-1960  Age: 62 y.o. MRN: [id number]  CC:  Chief Complaint  Patient presents with   Medical Management of Chronic Issues    Pt is doing well, no concerns     HPI Robert Wise presents for   Diabetes: With hyperglycemia prior, improved A1C in January.  Intermittent dosing of xigduo - sometimes every day depending on readings. No new side effects. On statin and ARB.  Microalbumin: 3.7 in 06/2021.  Lab Results  Component Value Date   HGBA1C 6.6 (H) 04/29/2022   HGBA1C 7.2 (H) 02/25/2022   HGBA1C 7.1 (A) 11/05/2021   Lab Results  Component Value Date   MICROALBUR 3.7 (H) 07/02/2021   LDLCALC 62 05/16/2022   CREATININE 0.97 05/27/2022    Elevated LFT's: Referred to GI prior, notes and labs reviewed. Repeat labs planned.  No jaundice, scleral icterus, abd pain/n/v. ASMA slightly positive and mildly elevated IgA but not IgG. Plan for repeat ASMA and HFP in 3 months per notes in April.   Hypothyroidism: Lab Results  Component Value Date   TSH 2.49 09/23/2022  Taking medication daily. Synthroid .  No new hot or cold intolerance. No new hair or skin changes, heart palpitations or new fatigue. No new weight changes.   Hypertension: Recent visit with cardiologist noted in May. Carotid artery stenosis with R carotid endarterectomy in February. Asymptomatic. BP well controlled on Benicar HCT.   BP Readings from Last 3 Encounters:  09/23/22 138/66  08/20/22 126/70  07/08/22 122/67   Lab Results  Component Value Date   CREATININE 1.06 09/23/2022   Hyperlipidemia: Tolerating lipitor with stable control prior.  Lab Results  Component Value Date   CHOL 158 09/23/2022   HDL 23.60 (L) 09/23/2022   LDLCALC 99 09/23/2022   LDLDIRECT 80.0 07/02/2021   TRIG 178.0 (H) 09/23/2022   CHOLHDL 7 09/23/2022   Lab Results  Component Value Date   ALT 21 09/23/2022   AST 16 09/23/2022   ALKPHOS 87 09/23/2022    BILITOT 0.5 09/23/2022    Nicotine addiction.  3 prior quit attempts. Plans to quit - has patches. D/w cardiology at last visit.    Depression: Treated by [facility], stable, no new symptoms or med side effects on buspar, lamictal, paxil.     09/23/2022   10:59 AM 06/17/2022    8:46 AM 05/27/2022    8:40 AM 05/06/2022    1:24 PM 02/25/2022   10:40 AM  Depression screen PHQ 2/9  Decreased Interest 0 0 0 0 0  Down, Depressed, Hopeless 0 0 0 0 0  PHQ - 2 Score 0 0 0 0 0  Altered sleeping 0 0 0  0  Tired, decreased energy 0 0 0  0  Change in appetite 0 0 0  0  Feeling bad or failure about yourself  0 0 0  0  Trouble concentrating 0 0 0  0  Moving slowly or fidgety/restless 0 0 0  0  Suicidal thoughts 0 0 0  0  PHQ-9 Score 0 0 0  0       History Patient Active Problem List   Diagnosis Date Noted   History of right-sided carotid endarterectomy 05/15/2022 08/20/2022   Asymptomatic carotid artery stenosis without infarction, right 05/15/2022   Anxiety state 01/25/2018   Fatigue 01/25/2018   Uncontrolled type 2 diabetes mellitus with complication, without long-term current use of insulin 02/10/2015  Chronic pain 12/01/2014   Depression 10/12/2014   Past Medical History:  Diagnosis Date   Anxiety    Depression    Diabetes mellitus without complication (HCC)    GERD (gastroesophageal reflux disease)    Hyperlipidemia    Hypertension    Hypothyroidism    OSA (obstructive sleep apnea) 12/15/2012   Sleep apnea    Thyroid disease    Past Surgical History:  Procedure Laterality Date   ANKLE SURGERY Left 04/01/2004   CATARACT EXTRACTION Right 04/2021   ENDARTERECTOMY Right 05/15/2022   Procedure: RIGHT ENDARTERECTOMY CAROTID;  Surgeon: Leonie Douglas, MD;  Location[facility] OR;  Service: Vascular;  Laterality: Right;   PATCH ANGIOPLASTY Right 05/15/2022   Procedure: BOVINE PATCH ANGIOPLASTY OF RIGHT CAROTID ARTERY;  Surgeon: Leonie Douglas, MD;  Location: [facility] OR;  Service:  Vascular;  Laterality: Right;   VASECTOMY     Allergies  Allergen Reactions   Tricor [Fenofibrate] Other (See Comments)    Unk reaction   Zocor [Simvastatin] Other (See Comments)    UNK reaction   Prior to Admission medications   Medication Sig Start Date End Date Taking? Authorizing Provider  aspirin (ASPIRIN CHILDRENS) 81 MG chewable tablet Chew 1 tablet (81 mg total) by mouth daily. 12/06/20  Yes Yates Decamp, MD  atorvastatin (LIPITOR) 80 MG tablet Take 1 tablet (80 mg total) by mouth daily. 02/25/22  Yes Shade Flood, MD  B Complex Vitamins (B-COMPLEX/B-12 PO) Take 1 tablet by mouth daily.   Yes [provider]  Blood Glucose Monitoring Suppl (ONE TOUCH ULTRA SYSTEM KIT) W/DEVICE KIT 1 kit by Does not apply route once. 10/25/11  Yes Weber, Sarah L, PA-C  busPIRone (BUSPAR) 30 MG tablet Take 1 tablet (30 mg total) by mouth 2 (two) times daily. 09/20/22  Yes Hurst, Glade Nurse, PA-C  Cholecalciferol (VITAMIN D) 125 MCG (5000 UT) CAPS Take 5,000 Units by mouth daily.   Yes [provider]  Dapagliflozin Pro-metFORMIN ER (XIGDUO XR) 07-998 MG TB24 Take 2 tablets by mouth daily. 02/25/22  Yes Shade Flood, MD  esomeprazole (NEXIUM) 40 MG capsule Take 1 capsule (40 mg total) by mouth daily. Patient taking differently: Take 20 mg by mouth daily. otc 04/11/17  Yes Wallis Bamberg, PA-C  ferrous sulfate 325 (65 FE) MG tablet Take 325 mg by mouth daily with breakfast.   Yes [provider]  fish oil-omega-3 fatty acids 1000 MG capsule Take 2 g by mouth daily.   Yes [provider]  GINKGO BILOBA EXTRACT PO Take 120 mg by mouth daily.   Yes [provider]  glucose blood (ONE TOUCH ULTRA TEST) test strip 1 each by Other route daily. 12/01/14  Yes Wallis Bamberg, PA-C  lamoTRIgine (LAMICTAL) 200 MG tablet Take 1 tablet (200 mg total) by mouth 2 (two) times daily. 09/20/22  Yes Hurst, Rosey Bath T, PA-C  Lancets (ONETOUCH ULTRASOFT) lancets USE AS INSTRUCTED 11/28/13   Yes Elvina Sidle, MD  levothyroxine (SYNTHROID) 150 MCG tablet TAKE 1 TABLET BY MOUTH DAILY BEFORE BREAKFAST. 07/11/22  Yes Shade Flood, MD  neomycin-bacitracin-polymyxin (NEOSPORIN) OINT Apply 1 Application topically daily as needed for wound care.   Yes [provider]  olmesartan-hydrochlorothiazide (BENICAR HCT) 40-12.5 MG tablet TAKE 1 TABLET BY MOUTH EVERY DAY IN THE MORNING Patient taking differently: Take 1 tablet by mouth daily. 03/22/22  Yes Yates Decamp, MD  PARoxetine (PAXIL) 30 MG tablet TAKE 2 TABLETS BY MOUTH EVERY DAY 09/16/22  Yes Meredith Staggers  R, MD  UNABLE TO FIND CPAP   Yes [provider]  vitamin B-12 (CYANOCOBALAMIN) 500 MCG tablet Take 500 mcg by mouth daily.   Yes [provider]   Social History   Socioeconomic History   Marital status: Married    Spouse name: Neysa Bonito   Number of children: 3   Years of education: 11.5   Highest education level: Not on file  Occupational History    Employer: [employer]    Comment: Driver  Tobacco Use   Smoking status: Every Day    Packs/day: 1.50    Years: 37.00    Additional pack years: 0.00    Total pack years: 55.50    Types: Cigarettes    Start date: 04/02/2019   Smokeless tobacco: Never  Vaping Use   Vaping Use: Never used  Substance and Sexual Activity   Alcohol use: No    Comment: quit: 08/14/2011   Drug use: No   Sexual activity: Yes    Birth control/protection: None  Other Topics Concern   Not on file  Social History Narrative   Patient lives at home with family.    Caffeine Use: 1 pot of coffee daily   Social Determinants of Health   Financial Resource Strain: Not on file  Food Insecurity: No Food Insecurity (05/17/2022)   Hunger Vital Sign    Worried About Running Out of Food in the Last Year: Never true    Ran Out of Food in the Last Year: Never true  Transportation Needs: No Transportation Needs (05/17/2022)   PRAPARE - Administrator, Civil Service  (Medical): No    Lack of Transportation (Non-Medical): No  Physical Activity: Not on file  Stress: Not on file  Social Connections: Not on file  Intimate Partner Violence: Not on file    Review of Systems  Constitutional:  Negative for fatigue and unexpected weight change.  Eyes:  Negative for visual disturbance.  Respiratory:  Negative for cough, chest tightness and shortness of breath.   Cardiovascular:  Negative for chest pain, palpitations and leg swelling.  Gastrointestinal:  Negative for abdominal pain and blood in stool.  Neurological:  Negative for dizziness, light-headedness and headaches.     Objective:   Vitals:   09/23/22 1058  BP: 138/66  Pulse: 65  Temp: 97.9 F (36.6 C)  TempSrc: Temporal  SpO2: 98%  Weight: 210 lb 6.4 oz (95.4 kg)  Height: 6' (1.829 m)     Physical Exam Vitals reviewed.  Constitutional:      Appearance: He is well-developed.  HENT:     Head: Normocephalic and atraumatic.  Neck:     Vascular: No carotid bruit or JVD.  Cardiovascular:     Rate and Rhythm: Normal rate and regular rhythm.     Heart sounds: Normal heart sounds. No murmur heard. Pulmonary:     Effort: Pulmonary effort is normal.     Breath sounds: Normal breath sounds. No rales.  Musculoskeletal:     Right lower leg: No edema.     Left lower leg: No edema.  Skin:    General: Skin is warm and dry.  Neurological:     Mental Status: He is alert and oriented to person, place, and time.  Psychiatric:        Mood and Affect: Mood normal.        Assessment & Plan:  Robert Wise is a 62 y.o. male . Hypothyroidism, unspecified type - Plan: TSH  -  Stable, tolerating current regimen. Medications refilled if needed. . Labs pending as above.   Mixed hyperlipidemia - Plan: atorvastatin (LIPITOR) 80 MG tablet, Comprehensive metabolic panel, Lipid panel  -  Stable, tolerating current regimen. Medications refilled. Labs pending as above.   Type 2 diabetes mellitus  with hyperglycemia, without long-term current use of insulin (HCC) - Plan: Dapagliflozin Pro-metFORMIN ER (XIGDUO XR) 07-998 MG TB24, Hemoglobin A1c  - check labs, recommended consistent xigduo dosing, but if A1c well controlled or lower, consider lower dose or isolated metformin or dapagliflozin.   Primary hypertension  - stable, continue benicar HCT.   Cigarette nicotine dependence without complication  - cessation recommended, handouts given to assist and has patches. Declined referral for lung cancer screening  Depression, unspecified depression type  -  Stable, tolerating current regimen. Continue follow up with psychiatry.   History of CEA (carotid endarterectomy)  - asymtpomatic, ongoing follow up imaging/ultrasound with cardiology, vascular.   Elevated LFTs - Plan: Comprehensive metabolic panel  - workup at GI as above. Asymptomatic. Planned repeat labs in next month or so, CMP ordered in office.   No orders of the defined types were placed in this encounter.  Patient Instructions  Depending on sugar level, we can make changes to xigdou.  It will not work quickly for blood sugar - would typically need to take that consistently. If sugars are well controlled we can cut back on med. I will let you know.      Signed,   Meredith Staggers, MD [facility], [facility] 09/23/22 11:50 AM

## 2022-09-23 NOTE — Patient Instructions (Addendum)
Depending on sugar level, we can make changes to xigdou.  It will not work quickly for blood sugar - would typically need to take that consistently. If sugars are well controlled we can cut back on med. I will let you know.   No other med changes at this time.  I will recheck the liver test, thyroid test, electrolytes.  Keep follow-up with gastroenterology regarding the liver test and plans but let me know if you have questions.  Let us know if we can help you with quitting smoking.  Hopefully the patches will be helpful and as we discussed each time of attempting quitting get you closer to the goal of maintaining cessation.  Handout below on helpful strategies to quit, but again let me know if we can help.  Take care!  Managing the Challenge of Quitting Smoking Quitting smoking is a physical and mental challenge. You may have cravings, withdrawal symptoms, and temptation to smoke. Before quitting, work with your health care provider to make a plan that can help you manage quitting. Making a plan before you quit may keep you from smoking when you have the urge to smoke while trying to quit. How to manage lifestyle changes Managing stress Stress can make you want to smoke, and wanting to smoke may cause stress. It is important to find ways to manage your stress. You could try some of the following: Practice relaxation techniques. Breathe slowly and deeply, in through your nose and out through your mouth. Listen to music. Soak in a bath or take a shower. Imagine a peaceful place or vacation. Get some support. Talk with family or friends about your stress. Join a support group. Talk with a counselor or therapist. Get some physical activity. Go for a walk, run, or bike ride. Play a favorite sport. Practice yoga.  Medicines Talk with your health care provider about medicines that might help you deal with cravings and make quitting easier for you. Relationships Social situations can be  difficult when you are quitting smoking. To manage this, you can: Avoid parties and other social situations where people might be smoking. Avoid alcohol. Leave right away if you have the urge to smoke. Explain to your family and friends that you are quitting smoking. Ask for support and let them know you might be a bit grumpy. Plan activities where smoking is not an option. General instructions Be aware that many people gain weight after they quit smoking. However, not everyone does. To keep from gaining weight, have a plan in place before you quit, and stick to the plan after you quit. Your plan should include: Eating healthy snacks. When you have a craving, it may help to: Eat popcorn, or try carrots, celery, or other cut vegetables. Chew sugar-free gum. Changing how you eat. Eat small portion sizes at meals. Eat 4-6 small meals throughout the day instead of 1-2 large meals a day. Be mindful when you eat. You should avoid watching television or doing other things that might distract you as you eat. Exercising regularly. Make time to exercise each day. If you do not have time for a long workout, do short bouts of exercise for 5-10 minutes several times a day. Do some form of strengthening exercise, such as weight lifting. Do some exercise that gets your heart beating and causes you to breathe deeply, such as walking fast, running, swimming, or biking. This is very important. Drinking plenty of water or other low-calorie or no-calorie drinks. Drink enough fluid to  keep your urine pale yellow.  How to recognize withdrawal symptoms Your body and mind may experience discomfort as you try to get used to not having nicotine in your system. These effects are called withdrawal symptoms. They may include: Feeling hungrier than normal. Having trouble concentrating. Feeling irritable or restless. Having trouble sleeping. Feeling depressed. Craving a cigarette. These symptoms may surprise you, but  they are normal to have when quitting smoking. To manage withdrawal symptoms: Avoid places, people, and activities that trigger your cravings. Remember why you want to quit. Get plenty of sleep. Avoid coffee and other drinks that contain caffeine. These may worsen some of your symptoms. How to manage cravings Come up with a plan for how to deal with your cravings. The plan should include the following: A definition of the specific situation you want to deal with. An activity or action you will take to replace smoking. A clear idea for how this action will help. The name of someone who could help you with this. Cravings usually last for 5-10 minutes. Consider taking the following actions to help you with your plan to deal with cravings: Keep your mouth busy. Chew sugar-free gum. Suck on hard candies or a straw. Brush your teeth. Keep your hands and body busy. Change to a different activity right away. Squeeze or play with a ball. Do an activity or a hobby, such as making bead jewelry, practicing needlepoint, or working with wood. Mix up your normal routine. Take a short exercise break. Go for a quick walk, or run up and down stairs. Focus on doing something kind or helpful for someone else. Call a friend or family member to talk during a craving. Join a support group. Contact a quitline. Where to find support To get help or find a support group: Call the National Cancer Institute's Smoking Quitline: 1-800-QUIT-NOW (725)473-7912) Text QUIT to SmokefreeTXT: 454098 Where to find more information Visit these websites to find more information on quitting smoking: U.S. Department of Health and Human Services: www.smokefree.gov American Lung Association: www.freedomfromsmoking.org Centers for Disease Control and Prevention (CDC): FootballExhibition.com.br American Heart Association: www.heart.org Contact a health care provider if: You want to change your plan for quitting. The medicines you are taking  are not helping. Your eating feels out of control or you cannot sleep. You feel depressed or become very anxious. Summary Quitting smoking is a physical and mental challenge. You will face cravings, withdrawal symptoms, and temptation to smoke again. Preparation can help you as you go through these challenges. Try different techniques to manage stress, handle social situations, and prevent weight gain. You can deal with cravings by keeping your mouth busy (such as by chewing gum), keeping your hands and body busy, calling family or friends, or contacting a quitline for people who want to quit smoking. You can deal with withdrawal symptoms by avoiding places where people smoke, getting plenty of rest, and avoiding drinks that contain caffeine. This information is not intended to replace advice given to you by your health care provider. Make sure you discuss any questions you have with your health care provider. Document Revised: 03/09/2021 Document Reviewed: 03/09/2021 Elsevier Patient Education  2024 [employer].

## 2022-09-24 ENCOUNTER — Encounter: Payer: Self-pay | Admitting: Family Medicine

## 2022-12-09 ENCOUNTER — Telehealth: Payer: Self-pay | Admitting: Adult Health

## 2022-12-09 NOTE — Telephone Encounter (Signed)
LVM and sent mychart msg informing pt of need to reschedue 01/07/23 video visit- NP out  If pt calls back to reschedule, you can offer a video visit slot on 10/11 with [facility]

## 2023-01-07 ENCOUNTER — Telehealth: Payer: BC Managed Care – PPO | Admitting: Adult Health

## 2023-01-26 ENCOUNTER — Other Ambulatory Visit: Payer: Self-pay | Admitting: Vascular Surgery

## 2023-01-26 ENCOUNTER — Other Ambulatory Visit: Payer: Self-pay | Admitting: Physician Assistant

## 2023-01-29 ENCOUNTER — Other Ambulatory Visit: Payer: Self-pay

## 2023-01-29 DIAGNOSIS — I10 Essential (primary) hypertension: Secondary | ICD-10-CM

## 2023-01-29 MED ORDER — OLMESARTAN MEDOXOMIL-HCTZ 40-12.5 MG PO TABS: 1.0000 | ORAL_TABLET | Freq: Every morning | ORAL | 1 refills | Status: DC

## 2023-02-04 ENCOUNTER — Other Ambulatory Visit: Payer: Self-pay

## 2023-02-04 ENCOUNTER — Telehealth: Payer: Self-pay | Admitting: Physician Assistant

## 2023-02-04 MED ORDER — PAROXETINE HCL 30 MG PO TABS: 60.0000 mg | ORAL_TABLET | Freq: Every day | ORAL | 0 refills | Status: DC

## 2023-02-04 NOTE — Telephone Encounter (Signed)
Patietnt called in for refill on Paroxetine 30mg . Ph: [phone] Appt 11/25 Pharmacy [facility] [address] [place]

## 2023-02-04 NOTE — Telephone Encounter (Signed)
Great!

## 2023-02-04 NOTE — Telephone Encounter (Signed)
Sent!

## 2023-02-24 ENCOUNTER — Ambulatory Visit: Payer: BC Managed Care – PPO | Admitting: Physician Assistant

## 2023-02-24 ENCOUNTER — Encounter: Payer: Self-pay | Admitting: Physician Assistant

## 2023-02-24 DIAGNOSIS — E039 Hypothyroidism, unspecified: Secondary | ICD-10-CM

## 2023-02-24 DIAGNOSIS — Z79899 Other long term (current) drug therapy: Secondary | ICD-10-CM

## 2023-02-24 DIAGNOSIS — F17200 Nicotine dependence, unspecified, uncomplicated: Secondary | ICD-10-CM

## 2023-02-24 DIAGNOSIS — F411 Generalized anxiety disorder: Secondary | ICD-10-CM | POA: Diagnosis not present

## 2023-02-24 DIAGNOSIS — G2581 Restless legs syndrome: Secondary | ICD-10-CM

## 2023-02-24 DIAGNOSIS — F3341 Major depressive disorder, recurrent, in partial remission: Secondary | ICD-10-CM

## 2023-02-24 DIAGNOSIS — G4733 Obstructive sleep apnea (adult) (pediatric): Secondary | ICD-10-CM | POA: Diagnosis not present

## 2023-02-24 DIAGNOSIS — R5381 Other malaise: Secondary | ICD-10-CM

## 2023-02-24 NOTE — Progress Notes (Signed)
[facility]  Patient ID: Robert Wise,  MRN: [id number]  PCP: Shade Flood, MD  Date of Evaluation: 02/24/2023 Time spent:30 minutes  Chief Complaint:  Chief Complaint   Anxiety; Depression; Follow-up    HISTORY/CURRENT STATUS: HPI For routine med check.  Overall Robert Wise is doing well.  For the past 3 weeks or so he has been a lot more tired.  It is not affecting his job but he does not feel like doing much of anything when he gets home.  He is also having more issues with restless legs in the evening.  He has known RLS which had been well treated until now.  He had labs last in June 2024.  Patient is able to enjoy things.  Work is going well.   No extreme sadness, tearfulness, or feelings of hopelessness.  Not sleeping well because his legs are jerking.  ADLs and personal hygiene are normal.   Denies any changes in concentration, making decisions, or remembering things.  Appetite has not changed.  Weight is stable.  No extreme anxiety.  No panic attacks.  The BuSpar has been helpful to prevent the anxiety.  Denies suicidal or homicidal thoughts.   Patient denies increased energy with decreased need for sleep, increased talkativeness, racing thoughts, impulsivity or risky behaviors, increased spending, increased libido, grandiosity, increased irritability or anger, paranoia, or hallucinations.   Denies dizziness, syncope, seizures, numbness, tingling, tremor, tics, unsteady gait, falls, slurred speech, confusion. Denies muscle or joint pain, stiffness, or dystonia.  His PCP takes care of cholesterol and glucose.  Individual Medical History/ Review of Systems: Changes? :Yes  broke his tailbone recently when he fell.  It is getting better.  Had no other injuries.  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.) Hydroxyzine caused too much sedation  Allergies: Tricor [fenofibrate] and Zocor [simvastatin]  Current Medications:  Current Outpatient Medications:    aspirin (ASPIRIN CHILDRENS) 81 MG chewable tablet, Chew 1 tablet (81 mg total) by mouth daily., Disp: , Rfl:    atorvastatin (LIPITOR) 80 MG tablet, Take 1 tablet (80 mg total) by mouth daily., Disp: 90 tablet, Rfl: 2   B Complex Vitamins (B-COMPLEX/B-12 PO), Take 1 tablet by mouth daily., Disp: , Rfl:    Blood Glucose Monitoring Suppl (ONE TOUCH ULTRA SYSTEM KIT) W/DEVICE KIT, 1 kit by Does not apply route once., Disp: 1 each, Rfl: 0   busPIRone (BUSPAR) 30 MG tablet, TAKE 1 TABLET BY MOUTH 2 TIMES DAILY., Disp: 180 tablet, Rfl: 0   Cholecalciferol (VITAMIN D) 125 MCG (5000 UT) CAPS, Take 5,000 Units by mouth daily., Disp: , Rfl:    Dapagliflozin Pro-metFORMIN ER (XIGDUO XR) 07-998 MG TB24, Take 2 tablets by mouth daily., Disp: 180 tablet, Rfl: 1   esomeprazole (NEXIUM) 40 MG capsule, Take 1 capsule (40 mg total) by mouth daily. (Patient taking differently: Take 20 mg by mouth daily. otc), Disp: 90 capsule, Rfl: 3   ferrous sulfate 325 (65 FE) MG tablet, Take 325 mg by mouth daily with breakfast., Disp: , Rfl:    fish oil-omega-3 fatty acids 1000 MG capsule, Take 2 g by mouth daily., Disp: , Rfl:    GINKGO BILOBA EXTRACT PO, Take 120 mg by mouth daily., Disp: , Rfl:  glucose blood (ONE TOUCH ULTRA TEST) test strip, 1 each by Other route daily., Disp: 100 each, Rfl: 8   lamoTRIgine (LAMICTAL) 200 MG tablet, TAKE 1 TABLET BY MOUTH TWICE A DAY, Disp: 180 tablet, Rfl: 0   Lancets (ONETOUCH ULTRASOFT) lancets, USE AS INSTRUCTED, Disp: 100 each, Rfl: 0   levothyroxine (SYNTHROID) 150 MCG tablet, TAKE 1 TABLET BY MOUTH DAILY BEFORE BREAKFAST., Disp: 90 tablet, Rfl: 1   neomycin-bacitracin-polymyxin (NEOSPORIN) OINT, Apply 1 Application topically  daily as needed for wound care., Disp: , Rfl:    olmesartan-hydrochlorothiazide (BENICAR HCT) 40-12.5 MG tablet, Take 1 tablet by mouth in the morning. Daily, Disp: 90 tablet, Rfl: 1   PARoxetine (PAXIL) 30 MG tablet, Take 2 tablets (60 mg total) by mouth daily., Disp: 180 tablet, Rfl: 0   UNABLE TO FIND, CPAP, Disp: , Rfl:    vitamin B-12 (CYANOCOBALAMIN) 500 MCG tablet, Take 500 mcg by mouth daily., Disp: , Rfl:  Medication Side Effects: none  Family Medical/ Social History: Changes? No  MENTAL HEALTH EXAM:  There were no vitals taken for this visit.There is no height or weight on file to calculate BMI.  General Appearance: Casual, Neat and Well Groomed  Eye Contact:  Good  Speech:  Clear and Coherent and Normal Rate  Volume:  Normal  Mood:  Euthymic  Affect:  Appropriate  Thought Process:  Goal Directed and Descriptions of Associations: Circumstantial  Orientation:  Full (Time, Place, and Person)  Thought Content: Logical   Suicidal Thoughts:  No  Homicidal Thoughts:  No  Memory:  WNL  Judgement:  Good  Insight:  Good  Psychomotor Activity:  Normal  Concentration:  Concentration: Good and Attention Span: Fair  Recall:  Good  Fund of Knowledge: Good  Language: Good  Assets:  Communication Skills Desire for Improvement Financial Resources/Insurance Housing Transportation Vocational/Educational  ADL's:  Intact  Cognition: WNL  Prognosis:  Good    DIAGNOSES:    ICD-10-CM   1. Recurrent major depressive disorder, in partial remission (HCC)  F33.41 Comprehensive metabolic panel    2. Generalized anxiety disorder  F41.1     3. Restless leg syndrome  G25.81     4. Obstructive sleep apnea  G47.33     5. Malaise and fatigue  R53.81 CBC with Differential/Platelet   R53.83 Comprehensive metabolic panel    B12 and Folate Panel    TSH    Hemoglobin A1c    VITAMIN D 25 Hydroxy (Vit-D Deficiency, Fractures)    6. Encounter for long-term (current) use of medications   Z79.899 CBC with Differential/Platelet    Comprehensive metabolic panel    B12 and Folate Panel    TSH    Hemoglobin A1c    VITAMIN D 25 Hydroxy (Vit-D Deficiency, Fractures)    7. Hypothyroidism, unspecified type  E03.9 CBC with Differential/Platelet    TSH    8. Smoker  F17.200      Receiving Psychotherapy: No   RECOMMENDATIONS:  PDMP was reviewed. Valium filled 04/25/2022.  Hydrocodone and oxycodone given in small quantities twice, once in February, once in May. I provided 30 minutes of face to face time during this encounter, including time spent before and after the visit in records review, medical decision making, counseling pertinent to today's visit, and charting.   We discussed the fatigue and leg movements which have worsened recently.  I will check labs, and depending on the results he should see his PCP again soon.  He feels like he should  anyway, it is about time.  I provided approximately 18 minutes in counseling concerning smoking cessation.  Refill all Rx for a total of 6 months when needed next time.   Continue BuSpar 30 mg, 1 p.o. twice daily.       Continue Lamictal 200 mg, 1 p.o. twice daily. Continue Paxil 30 mg, 2 p.o. daily. Continue multivitamin, ginkgo biloba, B complex, fish oil, vitamin D. Continue using CPAP. Return in 6 months.    Melony Overly, PA-C

## 2023-03-24 ENCOUNTER — Ambulatory Visit (INDEPENDENT_AMBULATORY_CARE_PROVIDER_SITE_OTHER): Payer: BC Managed Care – PPO | Admitting: Physician Assistant

## 2023-04-22 ENCOUNTER — Other Ambulatory Visit: Payer: Self-pay | Admitting: Family Medicine

## 2023-04-22 DIAGNOSIS — E039 Hypothyroidism, unspecified: Secondary | ICD-10-CM

## 2023-05-01 ENCOUNTER — Other Ambulatory Visit: Payer: Self-pay | Admitting: Physician Assistant

## 2023-05-05 ENCOUNTER — Other Ambulatory Visit: Payer: Self-pay | Admitting: Cardiology

## 2023-05-05 DIAGNOSIS — I10 Essential (primary) hypertension: Secondary | ICD-10-CM

## 2023-05-17 ENCOUNTER — Other Ambulatory Visit: Payer: Self-pay | Admitting: Physician Assistant

## 2023-06-18 ENCOUNTER — Other Ambulatory Visit: Payer: Self-pay | Admitting: Physician Assistant

## 2023-07-19 ENCOUNTER — Other Ambulatory Visit: Payer: Self-pay | Admitting: Family Medicine

## 2023-07-19 DIAGNOSIS — E039 Hypothyroidism, unspecified: Secondary | ICD-10-CM

## 2023-07-20 ENCOUNTER — Other Ambulatory Visit: Payer: Self-pay | Admitting: Family Medicine

## 2023-07-20 DIAGNOSIS — E782 Mixed hyperlipidemia: Secondary | ICD-10-CM

## 2023-07-22 IMAGING — CR DG CHEST 2V
2 series · 2 of 2 positions shown · non-contrast
Comparison: Previous studies including the examination of
09/24/2013

CLINICAL DATA: Shortness of breath

EXAM:
CHEST - 2 VIEW

[w chest pa]
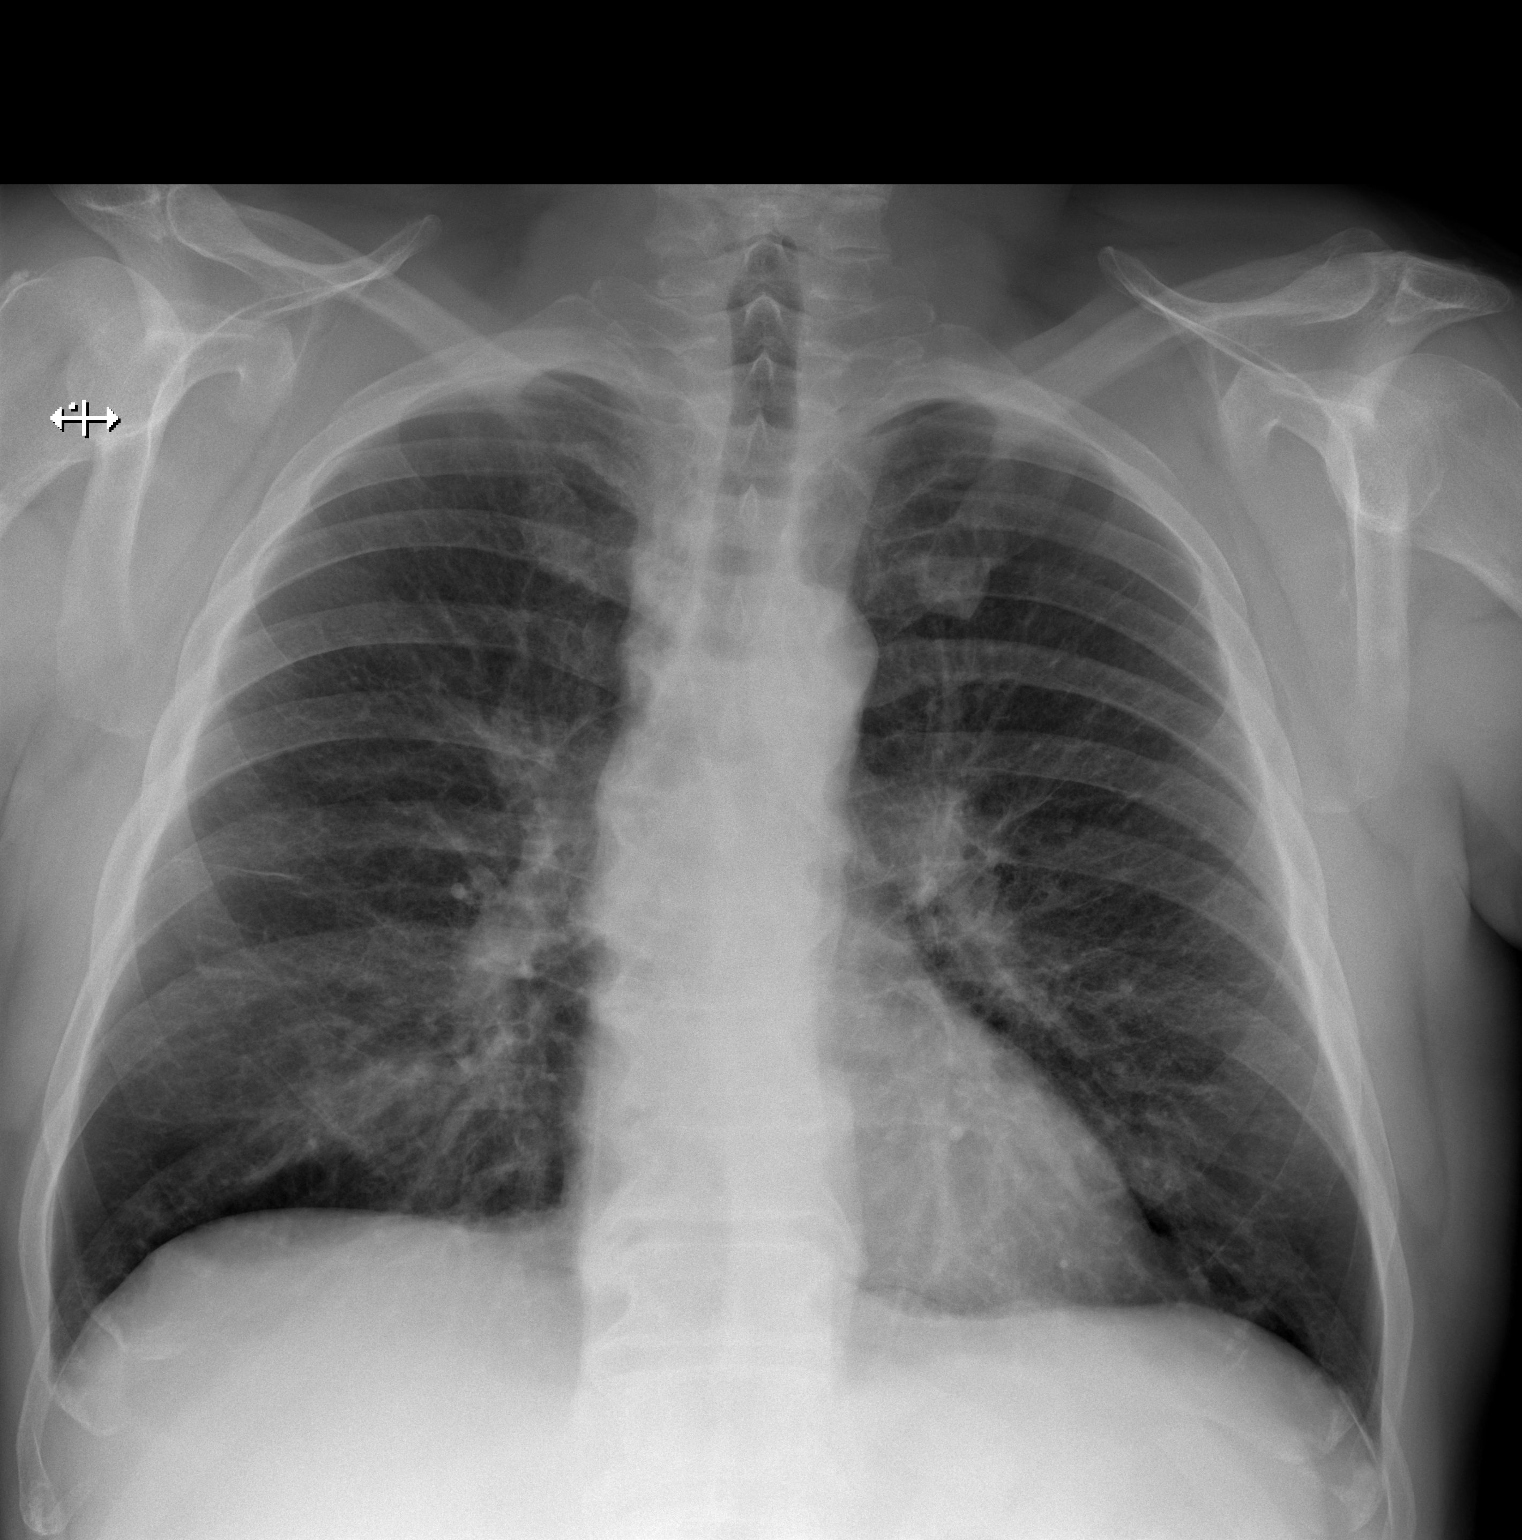

[w chest lat]
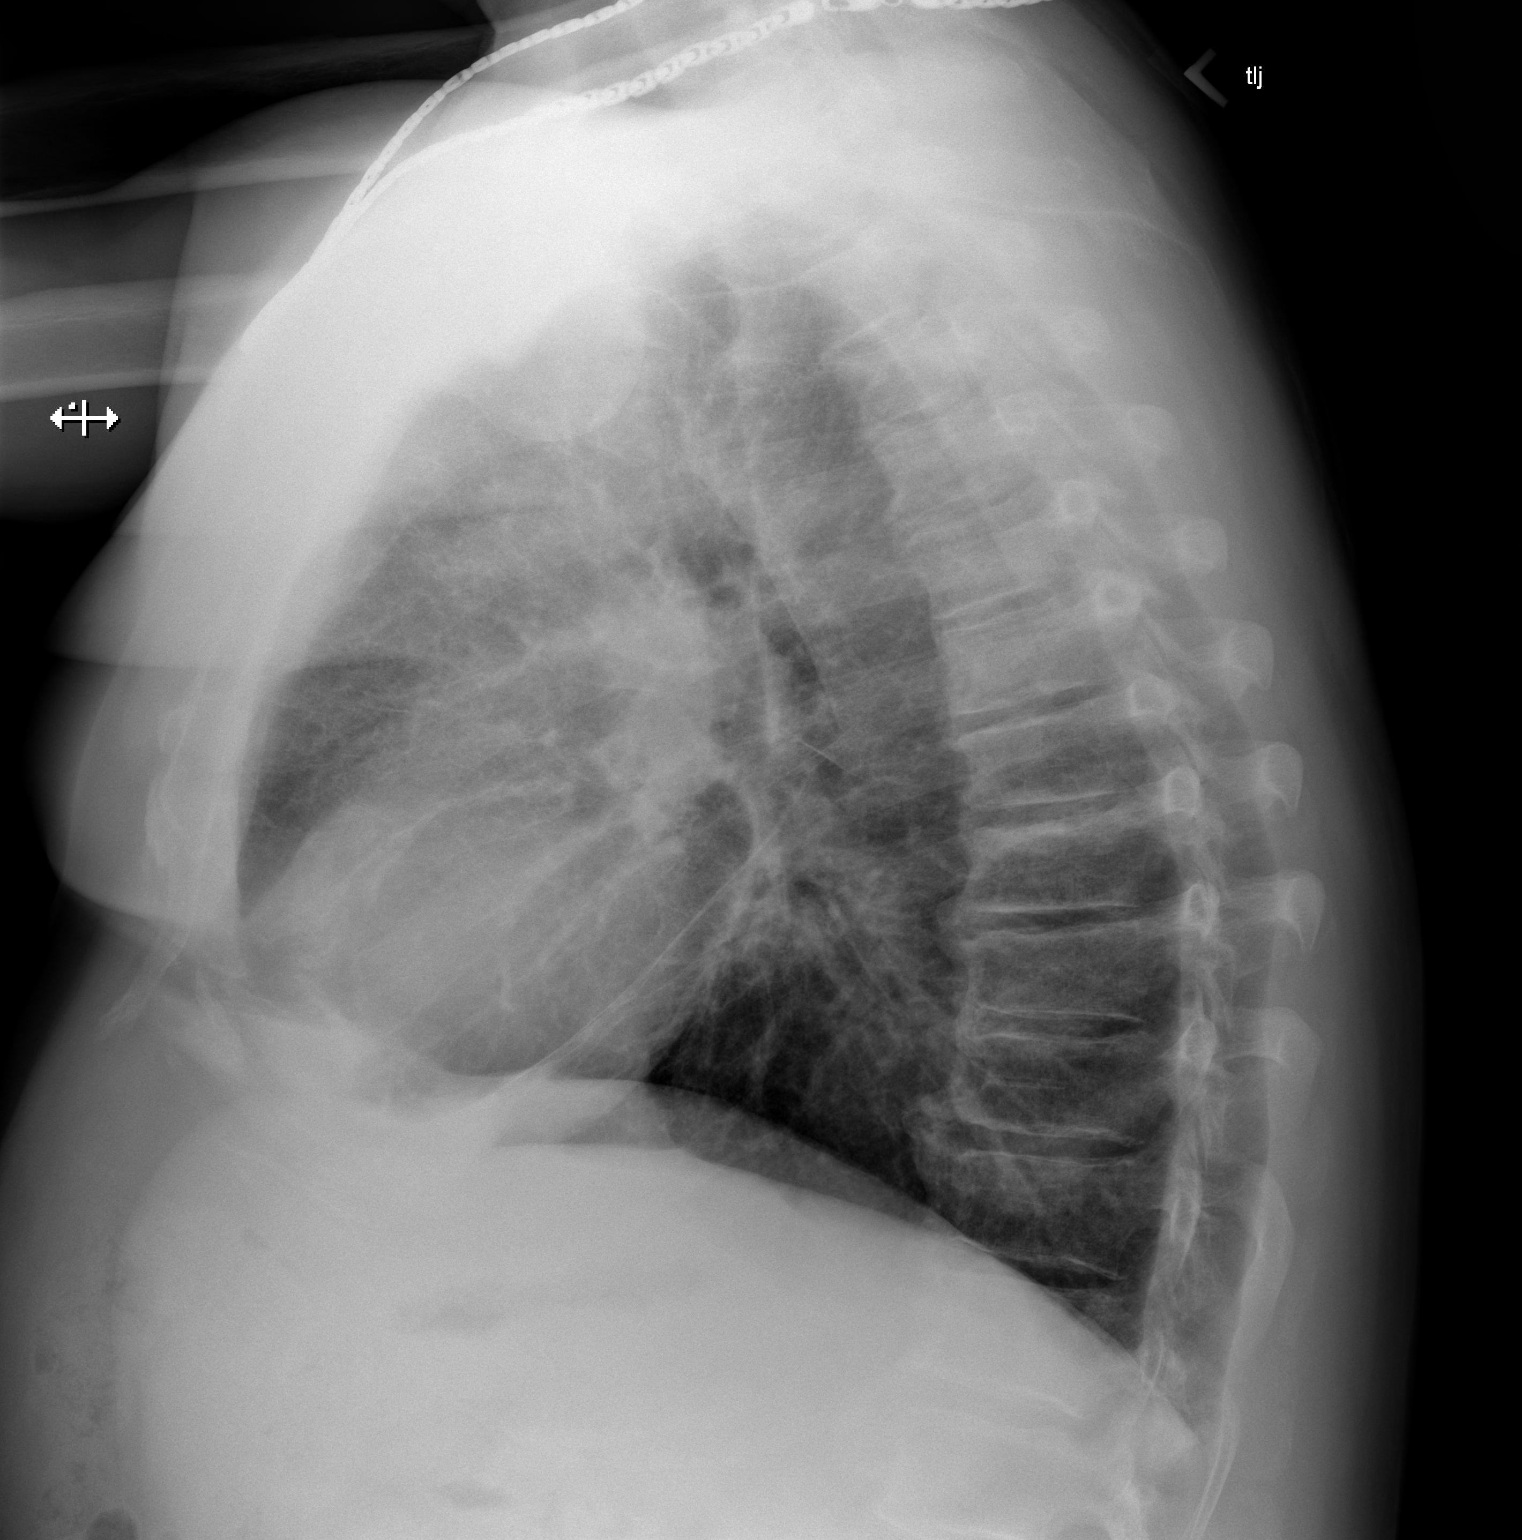

[2 of 2 positions shown; findings below may reference images not displayed]

FINDINGS: Cardiac size is within normal limits. There are no signs of
pulmonary edema. Subtle increased markings are seen in the medial
right lower lung fields suggesting atelectasis/pneumonia in the
right middle lobe. Rest of the lung fields are unremarkable. There
is no pleural effusion or pneumothorax.
IMPRESSION: Subtle increased markings in the medial right lower lung fields
suggest subsegmental atelectasis/pneumonia. There is no pleural
effusion or pneumothorax.

## 2023-07-22 NOTE — Telephone Encounter (Signed)
 Called to make an appointment there was no answer, LM

## 2023-07-22 NOTE — Telephone Encounter (Signed)
 Last office visit with me in June 2024 with 104-month follow-up recommended based on labs.  I do not see an appointment scheduled.  I will refill his medication temporarily, please schedule office visit within the next 1 month if possible.

## 2023-07-23 ENCOUNTER — Other Ambulatory Visit: Payer: Self-pay

## 2023-07-23 ENCOUNTER — Emergency Department (HOSPITAL_BASED_OUTPATIENT_CLINIC_OR_DEPARTMENT_OTHER)
Admission: EM | Admit: 2023-07-23 | Discharge: 2023-07-23 | Disposition: A | Discharge status: Home / Self Care | Attending: Emergency Medicine | Admitting: Emergency Medicine

## 2023-07-23 ENCOUNTER — Emergency Department (HOSPITAL_BASED_OUTPATIENT_CLINIC_OR_DEPARTMENT_OTHER)

## 2023-07-23 ENCOUNTER — Encounter (HOSPITAL_BASED_OUTPATIENT_CLINIC_OR_DEPARTMENT_OTHER): Payer: Self-pay | Admitting: Emergency Medicine

## 2023-07-23 DIAGNOSIS — Z7982 Long term (current) use of aspirin: Secondary | ICD-10-CM | POA: Diagnosis not present

## 2023-07-23 DIAGNOSIS — Z7984 Long term (current) use of oral hypoglycemic drugs: Secondary | ICD-10-CM | POA: Diagnosis not present

## 2023-07-23 DIAGNOSIS — Z79899 Other long term (current) drug therapy: Secondary | ICD-10-CM | POA: Diagnosis not present

## 2023-07-23 DIAGNOSIS — N50811 Right testicular pain: Secondary | ICD-10-CM | POA: Diagnosis not present

## 2023-07-23 DIAGNOSIS — E119 Type 2 diabetes mellitus without complications: Secondary | ICD-10-CM | POA: Diagnosis not present

## 2023-07-23 DIAGNOSIS — I10 Essential (primary) hypertension: Secondary | ICD-10-CM | POA: Diagnosis not present

## 2023-07-23 LAB — URINALYSIS, W/ REFLEX TO CULTURE (INFECTION SUSPECTED)
Bilirubin Urine: NEGATIVE
Glucose, UA: 250 mg/dL — AB
Hgb urine dipstick: NEGATIVE
Ketones, ur: NEGATIVE mg/dL
Leukocytes,Ua: NEGATIVE
Nitrite: NEGATIVE
Protein, ur: NEGATIVE mg/dL
Specific Gravity, Urine: 1.02 (ref 1.005–1.030)
pH: 5.5 (ref 5.0–8.0)

## 2023-07-23 MED ORDER — KETOROLAC TROMETHAMINE 15 MG/ML IJ SOLN
15.0000 mg | Freq: Once | INTRAMUSCULAR | Status: AC
  Administered 2023-07-23: 15 mg via INTRAMUSCULAR
  Filled 2023-07-23: qty 1

## 2023-07-23 NOTE — ED Provider Notes (Signed)
 [facility] Provider Note   CSN: [id number] Arrival date & time: 07/23/23  1012     History  Chief Complaint  Patient presents with   Groin Swelling    right    Robert Wise is a 63 y.o. male past medical history significant for diabetes and hypertension presents today for right testicular pain for about 2 to 3 weeks.  Patient also endorses mild swelling.  Patient denies fever, chills, nausea, vomiting, dysuria, hematuria, frequency, urgency, penile discharge or possibility of STD/STI.  HPI     Home Medications Prior to Admission medications   Medication Sig Start Date End Date Taking? Authorizing Provider  aspirin  (ASPIRIN  CHILDRENS) 81 MG chewable tablet Chew 1 tablet (81 mg total) by mouth daily. 12/06/20   Knox Perl, MD  atorvastatin  (LIPITOR ) 80 MG tablet TAKE 1 TABLET BY MOUTH EVERY DAY 07/21/23   Greene, Jeffrey R, MD  B Complex Vitamins (B-COMPLEX/B-12 PO) Take 1 tablet by mouth daily.    [provider]  Blood Glucose Monitoring Suppl (ONE TOUCH ULTRA SYSTEM KIT) W/DEVICE KIT 1 kit by Does not apply route once. 10/25/11   Weber, Sarah L, PA-C  busPIRone  (BUSPAR ) 30 MG tablet TAKE 1 TABLET BY MOUTH 2 TIMES DAILY. 06/18/23   Verneda Golder, PA-C  Cholecalciferol (VITAMIN D ) 125 MCG (5000 UT) CAPS Take 5,000 Units by mouth daily.    [provider]  Dapagliflozin  Pro-metFORMIN  ER (XIGDUO  XR) 07-998 MG TB24 Take 2 tablets by mouth daily. 09/23/22   Benjiman Bras, MD  esomeprazole  (NEXIUM ) 40 MG capsule Take 1 capsule (40 mg total) by mouth daily. Patient taking differently: Take 20 mg by mouth daily. otc 04/11/17   Adolph Hoop, PA-C  ferrous sulfate  325 (65 FE) MG tablet Take 325 mg by mouth daily with breakfast.    [provider]  fish oil-omega-3 fatty acids 1000 MG capsule Take 2 g by mouth daily.    [provider]  Pablo Boards BILOBA EXTRACT PO Take 120 mg by mouth daily.    [provider]  glucose blood (ONE TOUCH ULTRA TEST) test strip 1 each by Other route daily. 12/01/14   Adolph Hoop, PA-C  lamoTRIgine  (LAMICTAL ) 200 MG tablet TAKE 1 TABLET BY MOUTH TWICE A DAY 05/19/23   Hurst, Ammon Bales T, PA-C  Lancets [facility] ULTRASOFT) lancets USE AS INSTRUCTED 11/28/13   Dain Drown, MD  levothyroxine  (SYNTHROID ) 150 MCG tablet Take 1 tablet (150 mcg total) by mouth daily before breakfast. 07/22/23   Benjiman Bras, MD  neomycin-bacitracin-polymyxin (NEOSPORIN) OINT Apply 1 Application topically daily as needed for wound care.    [provider]  olmesartan -hydrochlorothiazide  (BENICAR  HCT) 40-12.5 MG tablet Take 1 tablet by mouth in the morning. Daily. Please keep scheduled appointment for future refills. Thank you. 05/06/23   Knox Perl, MD  PARoxetine  (PAXIL ) 30 MG tablet TAKE 2 TABLETS BY MOUTH DAILY 05/01/23   Hurst, Ammon Bales T, PA-C  UNABLE TO FIND CPAP    [provider]  vitamin B-12 (CYANOCOBALAMIN) 500 MCG tablet Take 500 mcg by mouth daily.    [provider]      Allergies    Tricor [fenofibrate] and Zocor [simvastatin]    Review of Systems   Review of Systems  Genitourinary:  Positive for scrotal swelling and testicular pain.    Physical Exam Updated Vital Signs BP (!) 158/75   Pulse 76   Temp 97.7 F (36.5 C) (Oral)  Resp 18   Wt 97.5 kg   SpO2 100%   BMI 29.16 kg/m  Physical Exam Vitals and nursing note reviewed. Exam conducted with a chaperone present.  Constitutional:      General: He is not in acute distress.    Appearance: He is well-developed. He is not ill-appearing, toxic-appearing or diaphoretic.     Comments: Uncomfortable appearing  HENT:     Head: Normocephalic and atraumatic.     Right Ear: External ear normal.     Left Ear: External ear normal.     Nose: Nose normal.  Eyes:     Conjunctiva/sclera: Conjunctivae normal.  Cardiovascular:     Rate and Rhythm: Normal rate and regular rhythm.      Pulses: Normal pulses.     Heart sounds: No murmur heard. Pulmonary:     Effort: Pulmonary effort is normal. No respiratory distress.     Breath sounds: Normal breath sounds.  Abdominal:     Palpations: Abdomen is soft.     Tenderness: There is no abdominal tenderness.  Genitourinary:    Testes:        Right: Tenderness and swelling present. Mass not present.  Musculoskeletal:        General: No swelling.     Cervical back: Neck supple.  Skin:    General: Skin is warm and dry.     Capillary Refill: Capillary refill takes less than 2 seconds.  Neurological:     General: No focal deficit present.     Mental Status: He is alert.  Psychiatric:        Mood and Affect: Mood normal.     ED Results / Procedures / Treatments   Labs (all labs ordered are listed, but only abnormal results are displayed) Labs Reviewed  URINALYSIS, W/ REFLEX TO CULTURE (INFECTION SUSPECTED) - Abnormal; Notable for the following components:      Result Value   Glucose, UA 250 (*)    Bacteria, UA RARE (*)    All other components within normal limits    EKG None  Radiology US  SCROTUM W/DOPPLER Result Date: 07/23/2023 CLINICAL DATA:  Right testicular pain EXAM: SCROTAL ULTRASOUND DOPPLER ULTRASOUND OF THE TESTICLES TECHNIQUE: Complete ultrasound examination of the testicles, epididymis, and other scrotal structures was performed. Color and spectral Doppler ultrasound were also utilized to evaluate blood flow to the testicles. COMPARISON:  None Available. FINDINGS: Right testicle Measurements: 4.3 x 2.7 x 2.6 cm. Heterogeneous with small echogenic densities in the testicle which could correlate with calcifications Left testicle Measurements: 4.2 x 2.1 x 3.2 cm. Similar appearance to the right testicle with a small calcifications Right epididymis:  Normal in size and appearance. Left epididymis:  Normal in size and appearance. Hydrocele:  None visualized. Varicocele:  None visualized. Pulsed Doppler  interrogation of both testes demonstrates normal low resistance arterial and venous waveforms bilaterally. Electronically Signed   By: Fredrich Jefferson M.D.   On: 07/23/2023 11:04    Procedures Procedures    Medications Ordered in ED Medications  ketorolac  (TORADOL ) 15 MG/ML injection 15 mg (has no administration in time range)    ED Course/ Medical Decision Making/ A&P                                 Medical Decision Making Amount and/or Complexity of Data Reviewed Radiology: ordered.   This patient presents to the ED for concern of testicular pain and  swelling differential diagnosis includes epididymitis, hernia, testicular torsion, gonorrhea, chlamydia    Lab Tests:  I Ordered, and personally interpreted labs.  The pertinent results include: UA with rare bacteria and 250 glucose   Imaging Studies ordered:  I ordered imaging studies including ultrasound torsion rule out I independently visualized and interpreted imaging which showed right and left testicle with small echogenic densities in the testicle which could correlate with calcifications.  Right and left epididymi normal in size and appearance.  No hydrocele or varicocele.  Normal low resistance arterial and venous waveforms bilaterally. I agree with the radiologist interpretation  Patient given Toradol  for pain prior to discharge.  Consider for admission or further workup however patient's vital signs, physical exam, labs, and imaging were reassuring.  Patient advised to take Tylenol /Motrin as needed for inflammation.  Patient to follow-up with urology if the symptoms persist for further evaluation and treatment.         Final Clinical Impression(s) / ED Diagnoses Final diagnoses:  Pain in right testicle    Rx / DC Orders ED Discharge Orders     None         Carie Charity, PA-C 07/23/23 1127    Owen Blowers P, DO 08/02/23 2329

## 2023-07-23 NOTE — Discharge Instructions (Addendum)
 Today you were seen for right testicular pain.  Please alternate taking Tylenol  and Motrin as needed for pain.  If your pain persist please follow-up with urology for further evaluation and treatment.  Thank you for letting us  treat you today. After reviewing your labs and imaging, I feel you are safe to go home. Please follow up with your PCP in the next several days and provide them with your records from this visit. Return to the Emergency Room if pain becomes severe or symptoms worsen.

## 2023-07-23 NOTE — ED Notes (Signed)
 Reviewed discharge instructions and follow up Pt ambulatory for discharge.

## 2023-07-23 NOTE — ED Triage Notes (Signed)
 Right testicular pain and edema x 2-3 weeks  , was seen at [facility] , sent here for further evaluation . Denies urinary symptoms .

## 2023-07-24 ENCOUNTER — Encounter: Payer: Self-pay | Admitting: Family Medicine

## 2023-07-24 NOTE — Telephone Encounter (Signed)
 ER note reviewed from yesterday, urinalysis with rare bacteria, 0-5 WBC/RBC -not sent for culture based on these results.  Scrotal ultrasound appears normal.  No sign of vascular issue, no sign of mass or apparent infection based on ultrasound.   Please have him seen in office tomorrow -at this point I do have a few openings.  At that time we will recheck area of concern, and then discussion of other testing or treatments, potentially may need to meet with urology as well, but needs evaluation to determine next step.     Called patient with plan above.  Understanding expressed.  I also gave him the option of being seen in the ER or urgent care tonight if needed or if he felt like he could not wait until tomorrow.

## 2023-07-25 ENCOUNTER — Ambulatory Visit: Admitting: Family Medicine

## 2023-07-25 ENCOUNTER — Other Ambulatory Visit (HOSPITAL_COMMUNITY)
Admission: RE | Admit: 2023-07-25 | Discharge: 2023-07-25 | Disposition: A | Discharge status: Home / Self Care | Source: Ambulatory Visit | Attending: Family Medicine | Admitting: Family Medicine

## 2023-07-25 ENCOUNTER — Encounter: Payer: Self-pay | Admitting: Family Medicine

## 2023-07-25 VITALS — BP 160/70 | HR 74 | Temp 98.0°F | Wt 221.0 lb

## 2023-07-25 DIAGNOSIS — N50811 Right testicular pain: Secondary | ICD-10-CM | POA: Insufficient documentation

## 2023-07-25 DIAGNOSIS — Z113 Encounter for screening for infections with a predominantly sexual mode of transmission: Secondary | ICD-10-CM | POA: Insufficient documentation

## 2023-07-25 DIAGNOSIS — N451 Epididymitis: Secondary | ICD-10-CM | POA: Diagnosis not present

## 2023-07-25 LAB — URINALYSIS, MICROSCOPIC ONLY

## 2023-07-25 LAB — POCT URINALYSIS DIP (MANUAL ENTRY)
Blood, UA: NEGATIVE
Glucose, UA: NEGATIVE mg/dL
Nitrite, UA: NEGATIVE
Spec Grav, UA: >=1.03 — AB (ref 1.010–1.025)
Urobilinogen, UA: NEGATIVE U/dL — AB
pH, UA: 5.5 (ref 5.0–8.0)

## 2023-07-25 LAB — CBC
HCT: 42.3 % (ref 39.0–52.0)
Hemoglobin: 14.3 g/dL (ref 13.0–17.0)
MCHC: 33.9 g/dL (ref 30.0–36.0)
MCV: 89.4 fl (ref 78.0–100.0)
Platelets: 263 10*3/uL (ref 150.0–400.0)
RBC: 4.73 Mil/uL (ref 4.22–5.81)
RDW: 15.5 % (ref 11.5–15.5)
WBC: 7.4 10*3/uL (ref 4.0–10.5)

## 2023-07-25 MED ORDER — DOXYCYCLINE HYCLATE 100 MG PO TABS: 100.0000 mg | ORAL_TABLET | Freq: Two times a day (BID) | ORAL | 0 refills | Status: DC

## 2023-07-25 MED ORDER — CEFTRIAXONE SODIUM 1 G IJ SOLR
1.0000 g | Freq: Once | INTRAMUSCULAR | Status: AC
  Administered 2023-07-25: 1 g via INTRAMUSCULAR

## 2023-07-25 MED ORDER — HYDROCODONE-ACETAMINOPHEN 5-325 MG PO TABS: 1.0000 | ORAL_TABLET | Freq: Four times a day (QID) | ORAL | 0 refills | Status: AC | PRN

## 2023-07-25 NOTE — Patient Instructions (Signed)
 I will check some labs today but I suspect your pain could be from an infection in the epididymis or one of the areas behind the testicle.  Infection can also be in the testicle itself.  I did start you on antibiotics today with an injection of an antibiotic plus pills to take twice per day for the next 10 days.  Okay to stay on ibuprofen if needed for now but I wrote for a few hydrocodone  if needed for more severe pain over the weekend.  If any worsening over the weekend be seen in the ER.  Give me an update early next week and let me know how you are doing.  If not improving I will have you see urology.  Hang in there.   Return to the clinic or go to the nearest emergency room if any of your symptoms worsen or new symptoms occur.  Epididymitis  Epididymitis is inflammation or swelling of the epididymis. This is caused by an infection. The epididymis is a cord-like structure that is located along the top and back part of the testicle. It collects and stores sperm from the testicle. This condition can also cause pain and swelling of the testicle and scrotum. Symptoms usually start suddenly (acute epididymitis). Sometimes epididymitis starts gradually and lasts for a while (chronic epididymitis). Chronic epididymitis may be harder to treat. What are the causes? In men ages 76-40, this condition is usually caused by a bacterial infection or a sexually transmitted infection (STI), such as gonorrhea or chlamydia. In men 8 and older, this condition is usually caused by bacteria from a urinary blockage or from abnormalities in the urinary system. These can result from: Having a tube placed into the bladder (urinary catheter). Having an enlarged or inflamed prostate gland. Having recently had urinary tract surgery. Having a problem with a backward flow of urine (retrograde). In men who have a condition that weakens the body's defense system (immune system), such as human immunodeficiency virus (HIV), this  condition can be caused by: Other bacteria, including tuberculosis and syphilis. Viruses. Fungi. Sometimes this condition occurs without infection. This may happen because of trauma or repetitive activities such as sports. What increases the risk? You are more likely to develop this condition if you have: Unprotected sex with more than one partner. Anal sex. Had recent surgery. A urinary catheter. Urinary problems. A suppressed immune system. What are the signs or symptoms? This condition usually begins suddenly with chills, fever, and pain behind the scrotum and in the testicle. Other symptoms include: Swelling of the scrotum, testicle, or both. Pain when ejaculating or urinating. Pain in the back or abdomen. Nausea. Itching and discharge from the penis. A frequent need to pass urine. Redness, increased warmth, and tenderness of the scrotum. How is this diagnosed? Your health care provider can diagnose this condition based on your symptoms and medical history. Your health care provider will also do a physical exam to check your scrotum and testicle for swelling, pain, and redness. You may also have other tests, including: Testing of discharge from the penis. Testing your urine for infections, such as STIs. Ultrasound to check for blood flow and inflammation. Your health care provider may test you for other STIs, including HIV. How is this treated? Treatment for this condition depends on the cause. If your condition is caused by a bacterial infection, oral antibiotic medicine may be prescribed. If the bacterial infection has spread to your blood, you may need to receive IV antibiotics. For both  bacterial and nonbacterial epididymitis, you may be treated with: Rest. Elevation of the scrotum. Pain medicines. Anti-inflammatory medicines. Surgery may be needed if: You have pus buildup in the scrotum (abscess). You have epididymitis that has not responded to other treatments. Follow  these instructions at home: Medicines Take over-the-counter and prescription medicines only as told by your health care provider. If you were prescribed an antibiotic medicine, take it as told by your health care provider. Do not stop taking the antibiotic even if your condition improves. Sexual activity If your epididymitis was caused by an STI, avoid sexual activity until your treatment is complete. Inform your sexual partner or partners if you test positive for an STI. They may need to be treated. Do not engage in sexual activity with your partner or partners until their treatment is completed. Managing pain and swelling  If directed, raise (elevate) your scrotum and apply ice. To do this: Put ice in a plastic bag. Place a small towel or pillow between your legs. Rest your scrotum on the pillow or towel. Place another towel between your skin and the plastic bag. Leave the ice on for 20 minutes, 2-3 times a day. Remove the ice if your skin turns bright red. This is very important. If you cannot feel pain, heat, or cold, you have a greater risk of damage to the area. Keep your scrotum elevated and supported while resting. Ask your health care provider if you should wear a scrotal support, such as a jockstrap. Wear it as told by your health care provider. Try taking a sitz bath to help with discomfort. This is a warm water bath that is taken while you are sitting down. The water should come up to your hips and should cover your buttocks. Do this 3-4 times per day or as told by your health care provider. General instructions Drink enough fluid to keep your urine pale yellow. Return to your normal activities as told by your health care provider. Ask your health care provider what activities are safe for you. Keep all follow-up visits. This is important. Contact a health care provider if: You have a fever. Your pain medicine is not helping. Your pain is getting worse. Your symptoms do not  improve within 3 days. Summary Epididymitis is inflammation or swelling of the epididymis. This is caused by an infection. This condition can also cause pain and swelling of the testicle and scrotum. Treatment for this condition depends on the cause. If your condition is caused by a bacterial infection, oral antibiotic medicine may be prescribed. Inform your sexual partner or partners if you test positive for an STI. They may need to be treated. Do not engage in sexual activity with your partner or partners until their treatment is completed. Contact a health care provider if your symptoms do not improve within 3 days. This information is not intended to replace advice given to you by your health care provider. Make sure you discuss any questions you have with your health care provider. Document Revised: 10/25/2020 Document Reviewed: 10/25/2020 Elsevier Patient Education  2024 [employer].

## 2023-07-25 NOTE — Telephone Encounter (Signed)
 Called pt this morning and informed him of appt @ 10 am. Patient confirmed and expressed gratitude.

## 2023-07-25 NOTE — Progress Notes (Signed)
 Subjective:  Patient ID: Robert Wise, male    DOB: 06/06/1960  Age: 63 y.o. MRN: [id number]  CC:  Chief Complaint  Patient presents with   Testicle Pain    Right testicle pain, very sever and started 2-3 weeks ago on and off but then the pain started all the time around wednesday.     HPI Robert Wise presents for   Right testicular pain See phone note yesterday.  Evaluated in the ER on 07/23/2023.  Reported intermittent pain for 2 to 3 weeks at that time, mild swelling reported as well.  Denied other associated symptoms including nausea vomiting dysuria hematuria frequency urgency penile discharge or exposure to STI.  Blood pressure elevated at the time with pain but other vital signs were stable including afebrile and temp 97.7.  Tenderness, swelling was noted on his exam of the right testes.  No mass present.  Urinalysis with rare bacteria but 0-5 on the WBC and RBC, culture was not obtained.  Glucose 250 on urinalysis,, but he does take SGLT2.  Scrotal ultrasound without sign of torsion, mass or acute abnormality.  There were some small echogenic densities in the testicle on the left which could correlate with calcifications.  Right and left epididymides were normal in size and appearance and no hydrocele or varicocele.  He was given an injection of Toradol  and discharged home, with plan to take Tylenol  or Motrin as needed with urology follow-up if persistent symptoms.  Since ER visit he has continued to have pain. History as above. No urinary symptoms/discharge.  Pain on R testicle intermittently for 2-3 weeks, then constant pain, worse pain 2 days ago prompting ER visit. No redness/rash of scrotum, no genital lesions. No urinary frequency/urgency/dysuria/hematuria.  No change in pain with toradol  injection - still painful. Tried ibuprofen 600mg  every 6 hours. No improvement. Pain is about the same as 2 days ago.  No prior similar sx's.  Pain into R groin at times - shooting pain  into fold of groin, some numbness into R buttock at times, no back pain.   No fever.    History Patient Active Problem List   Diagnosis Date Noted   History of right-sided carotid endarterectomy 05/15/2022 08/20/2022   Asymptomatic carotid artery stenosis without infarction, right 05/15/2022   Anxiety state 01/25/2018   Fatigue 01/25/2018   Uncontrolled type 2 diabetes mellitus with complication, without long-term current use of insulin  02/10/2015   Depression 10/12/2014   Past Medical History:  Diagnosis Date   Anxiety    Depression    Diabetes mellitus without complication (HCC)    GERD (gastroesophageal reflux disease)    Hyperlipidemia    Hypertension    Hypothyroidism    OSA (obstructive sleep apnea) 12/15/2012   Sleep apnea    Thyroid  disease    Past Surgical History:  Procedure Laterality Date   ANKLE SURGERY Left 04/01/2004   CATARACT EXTRACTION Right 04/2021   ENDARTERECTOMY Right 05/15/2022   Procedure: RIGHT ENDARTERECTOMY CAROTID;  Surgeon: Carlene Che, MD;  Location: [facility] OR;  Service: Vascular;  Laterality: Right;   PATCH ANGIOPLASTY Right 05/15/2022   Procedure: BOVINE PATCH ANGIOPLASTY OF RIGHT CAROTID ARTERY;  Surgeon: Carlene Che, MD;  Location: [facility] OR;  Service: Vascular;  Laterality: Right;   VASECTOMY     Allergies  Allergen Reactions   Tricor [Fenofibrate] Other (See Comments)    Unk reaction   Zocor [Simvastatin] Other (See Comments)    UNK reaction   Prior  to Admission medications   Medication Sig Start Date End Date Taking? Authorizing Provider  aspirin  (ASPIRIN  CHILDRENS) 81 MG chewable tablet Chew 1 tablet (81 mg total) by mouth daily. 12/06/20  Yes Knox Perl, MD  atorvastatin  (LIPITOR ) 80 MG tablet TAKE 1 TABLET BY MOUTH EVERY DAY 07/21/23  Yes Benjiman Bras, MD  B Complex Vitamins (B-COMPLEX/B-12 PO) Take 1 tablet by mouth daily.   Yes [provider]  Blood Glucose Monitoring Suppl (ONE TOUCH ULTRA SYSTEM KIT) W/DEVICE  KIT 1 kit by Does not apply route once. 10/25/11  Yes Weber, Sarah L, PA-C  busPIRone  (BUSPAR ) 30 MG tablet TAKE 1 TABLET BY MOUTH 2 TIMES DAILY. 06/18/23  Yes Hurst, Rogene Claude, PA-C  Cholecalciferol (VITAMIN D ) 125 MCG (5000 UT) CAPS Take 5,000 Units by mouth daily.   Yes [provider]  Dapagliflozin  Pro-metFORMIN  ER (XIGDUO  XR) 07-998 MG TB24 Take 2 tablets by mouth daily. 09/23/22  Yes Benjiman Bras, MD  esomeprazole  (NEXIUM ) 40 MG capsule Take 1 capsule (40 mg total) by mouth daily. Patient taking differently: Take 20 mg by mouth daily. otc 04/11/17  Yes Adolph Hoop, PA-C  ferrous sulfate  325 (65 FE) MG tablet Take 325 mg by mouth daily with breakfast.   Yes [provider]  fish oil-omega-3 fatty acids 1000 MG capsule Take 2 g by mouth daily.   Yes [provider]  GINKGO BILOBA EXTRACT PO Take 120 mg by mouth daily.   Yes [provider]  glucose blood (ONE TOUCH ULTRA TEST) test strip 1 each by Other route daily. 12/01/14  Yes Adolph Hoop, PA-C  lamoTRIgine  (LAMICTAL ) 200 MG tablet TAKE 1 TABLET BY MOUTH TWICE A DAY 05/19/23  Yes Hurst, Teresa T, PA-C  Lancets [facility] ULTRASOFT) lancets USE AS INSTRUCTED 11/28/13  Yes Dain Drown, MD  levothyroxine  (SYNTHROID ) 150 MCG tablet Take 1 tablet (150 mcg total) by mouth daily before breakfast. 07/22/23  Yes Benjiman Bras, MD  neomycin-bacitracin-polymyxin (NEOSPORIN) OINT Apply 1 Application topically daily as needed for wound care.   Yes [provider]  olmesartan -hydrochlorothiazide  (BENICAR  HCT) 40-12.5 MG tablet Take 1 tablet by mouth in the morning. Daily. Please keep scheduled appointment for future refills. Thank you. 05/06/23  Yes Knox Perl, MD  PARoxetine  (PAXIL ) 30 MG tablet TAKE 2 TABLETS BY MOUTH DAILY 05/01/23  Yes Hurst, Teresa T, PA-C  UNABLE TO FIND CPAP   Yes [provider]  vitamin B-12 (CYANOCOBALAMIN) 500 MCG tablet Take 500 mcg by mouth daily.   Yes [provider]   Social History   Socioeconomic History   Marital status: Married    Spouse name: Robert Wise   Number of children: 3   Years of education: 11.5   Highest education level: Not on file  Occupational History    Employer: [employer]    Comment: Driver  Tobacco Use   Smoking status: Every Day    Current packs/day: 1.50    Average packs/day: 1.5 packs/day for 37.4 years (56.1 ttl pk-yrs)    Types: Cigarettes    Start date: 04/02/2019   Smokeless tobacco: Never  Vaping Use   Vaping status: Never Used  Substance and Sexual Activity   Alcohol use: No    Comment: quit: 08/14/2011   Drug use: No   Sexual activity: Yes    Birth control/protection: None  Other Topics Concern   Not on file  Social History Narrative   Patient lives at home with family.  Caffeine Use: 1 pot of coffee daily   Social Drivers of Corporate investment banker Strain: Not on file  Food Insecurity: No Food Insecurity (05/17/2022)   Hunger Vital Sign    Worried About Running Out of Food in the Last Year: Never true    Ran Out of Food in the Last Year: Never true  Transportation Needs: No Transportation Needs (05/17/2022)   PRAPARE - Administrator, Civil Service (Medical): No    Lack of Transportation (Non-Medical): No  Physical Activity: Not on file  Stress: Not on file  Social Connections: Not on file  Intimate Partner Violence: Not on file    Review of Systems Per HPI  Objective:   Vitals:   07/25/23 0949  BP: (!) 160/70  Pulse: 74  Temp: 98 F (36.7 C)  TempSrc: Oral  SpO2: 98%  Weight: 221 lb (100.2 kg)     Physical Exam Constitutional:      General: He is in acute distress (Appears uncomfortable, standing).  Genitourinary:    Comments: Possible slight enlargement of right testicle versus left, diffusely tender, primarily posterior aspect/epididymal area without masses palpated.  Minimal discomfort into the inguinal fold without significant appreciable  lymphadenopathy.  No penile/scrotal rash or skin changes, no penile discharge. Musculoskeletal:     Comments: No midline low back tenderness.  Minimal discomfort over the right SI, sciatic notch.  Pain-free lateral flexion, rotation of the lumbar spine, discomfort in the right testicle with forward flexion but feels more of compression sensation into the groin, not radicular pain shooting from back.  Ambulating without assistive device.  Skin:    General: Skin is warm and dry.     Findings: No rash.    Results for orders placed or performed in visit on 07/25/23  POCT urinalysis dipstick   Collection Time: 07/25/23 10:57 AM  Result Value Ref Range   Color, UA straw (A) yellow   Clarity, UA clear clear   Glucose, UA negative negative mg/dL   Bilirubin, UA small (A) negative   Ketones, POC UA trace (5) (A) negative mg/dL   Spec Grav, UA >=1.096 (A) 1.010 - 1.025   Blood, UA negative negative   pH, UA 5.5 5.0 - 8.0   Protein Ur, POC trace (A) negative mg/dL   Urobilinogen, UA negative (A) 0.2 or 1.0 E.U./dL   Nitrite, UA Negative Negative   Leukocytes, UA Trace (A) Negative    Assessment & Plan:  CLERANCE UMLAND is a 63 y.o. male . Testicular pain, right - Plan: CBC, POCT urinalysis dipstick, Urine cytology ancillary only, Urine Microscopic, Urine Culture, HYDROcodone -acetaminophen  (NORCO/VICODIN) 5-325 MG tablet  Epididymitis - Plan: Urine Culture, cefTRIAXone (ROCEPHIN) injection 1 g, doxycycline  (VIBRA -TABS) 100 MG tablet  - Suspected epididymitis based on exam.  Some referred pain likely.  Check urine culture given a few WBC on urinalysis today but no urinary symptoms.  Will treat for epididymitis with Rocephin 1 g today, followed by doxycycline .  Short term hydrocodone   if needed for pain with potential side effects discussed.  Okay to continue ibuprofen for now for inflammation.  If not significantly improved by Monday, recommend urology eval, advised for him to let me know and I  can have an urgent eval ordered at that time.  ER precautions given over the weekend.   Elevated blood pressure likely due to pain.  Continue to monitor.  If elevated once pain improves can discuss further.  Meds ordered this encounter  Medications  cefTRIAXone (ROCEPHIN) injection 1 g   doxycycline  (VIBRA -TABS) 100 MG tablet    Sig: Take 1 tablet (100 mg total) by mouth 2 (two) times daily.    Dispense:  20 tablet    Refill:  0   HYDROcodone -acetaminophen  (NORCO/VICODIN) 5-325 MG tablet    Sig: Take 1 tablet by mouth every 6 (six) hours as needed for moderate pain (pain score 4-6).    Dispense:  15 tablet    Refill:  0   Patient Instructions  I will check some labs today but I suspect your pain could be from an infection in the epididymis or one of the areas behind the testicle.  Infection can also be in the testicle itself.  I did start you on antibiotics today with an injection of an antibiotic plus pills to take twice per day for the next 10 days.  Okay to stay on ibuprofen if needed for now but I wrote for a few hydrocodone  if needed for more severe pain over the weekend.  If any worsening over the weekend be seen in the ER.  Give me an update early next week and let me know how you are doing.  If not improving I will have you see urology.  Hang in there.   Return to the clinic or go to the nearest emergency room if any of your symptoms worsen or new symptoms occur.  Epididymitis  Epididymitis is inflammation or swelling of the epididymis. This is caused by an infection. The epididymis is a cord-like structure that is located along the top and back part of the testicle. It collects and stores sperm from the testicle. This condition can also cause pain and swelling of the testicle and scrotum. Symptoms usually start suddenly (acute epididymitis). Sometimes epididymitis starts gradually and lasts for a while (chronic epididymitis). Chronic epididymitis may be harder to treat. What are  the causes? In men ages 66-40, this condition is usually caused by a bacterial infection or a sexually transmitted infection (STI), such as gonorrhea or chlamydia. In men 62 and older, this condition is usually caused by bacteria from a urinary blockage or from abnormalities in the urinary system. These can result from: Having a tube placed into the bladder (urinary catheter). Having an enlarged or inflamed prostate gland. Having recently had urinary tract surgery. Having a problem with a backward flow of urine (retrograde). In men who have a condition that weakens the body's defense system (immune system), such as human immunodeficiency virus (HIV), this condition can be caused by: Other bacteria, including tuberculosis and syphilis. Viruses. Fungi. Sometimes this condition occurs without infection. This may happen because of trauma or repetitive activities such as sports. What increases the risk? You are more likely to develop this condition if you have: Unprotected sex with more than one partner. Anal sex. Had recent surgery. A urinary catheter. Urinary problems. A suppressed immune system. What are the signs or symptoms? This condition usually begins suddenly with chills, fever, and pain behind the scrotum and in the testicle. Other symptoms include: Swelling of the scrotum, testicle, or both. Pain when ejaculating or urinating. Pain in the back or abdomen. Nausea. Itching and discharge from the penis. A frequent need to pass urine. Redness, increased warmth, and tenderness of the scrotum. How is this diagnosed? Your health care provider can diagnose this condition based on your symptoms and medical history. Your health care provider will also do a physical exam to check your scrotum and testicle for swelling, pain, and  redness. You may also have other tests, including: Testing of discharge from the penis. Testing your urine for infections, such as STIs. Ultrasound to check for  blood flow and inflammation. Your health care provider may test you for other STIs, including HIV. How is this treated? Treatment for this condition depends on the cause. If your condition is caused by a bacterial infection, oral antibiotic medicine may be prescribed. If the bacterial infection has spread to your blood, you may need to receive IV antibiotics. For both bacterial and nonbacterial epididymitis, you may be treated with: Rest. Elevation of the scrotum. Pain medicines. Anti-inflammatory medicines. Surgery may be needed if: You have pus buildup in the scrotum (abscess). You have epididymitis that has not responded to other treatments. Follow these instructions at home: Medicines Take over-the-counter and prescription medicines only as told by your health care provider. If you were prescribed an antibiotic medicine, take it as told by your health care provider. Do not stop taking the antibiotic even if your condition improves. Sexual activity If your epididymitis was caused by an STI, avoid sexual activity until your treatment is complete. Inform your sexual partner or partners if you test positive for an STI. They may need to be treated. Do not engage in sexual activity with your partner or partners until their treatment is completed. Managing pain and swelling  If directed, raise (elevate) your scrotum and apply ice. To do this: Put ice in a plastic bag. Place a small towel or pillow between your legs. Rest your scrotum on the pillow or towel. Place another towel between your skin and the plastic bag. Leave the ice on for 20 minutes, 2-3 times a day. Remove the ice if your skin turns bright red. This is very important. If you cannot feel pain, heat, or cold, you have a greater risk of damage to the area. Keep your scrotum elevated and supported while resting. Ask your health care provider if you should wear a scrotal support, such as a jockstrap. Wear it as told by your health  care provider. Try taking a sitz bath to help with discomfort. This is a warm water bath that is taken while you are sitting down. The water should come up to your hips and should cover your buttocks. Do this 3-4 times per day or as told by your health care provider. General instructions Drink enough fluid to keep your urine Wise yellow. Return to your normal activities as told by your health care provider. Ask your health care provider what activities are safe for you. Keep all follow-up visits. This is important. Contact a health care provider if: You have a fever. Your pain medicine is not helping. Your pain is getting worse. Your symptoms do not improve within 3 days. Summary Epididymitis is inflammation or swelling of the epididymis. This is caused by an infection. This condition can also cause pain and swelling of the testicle and scrotum. Treatment for this condition depends on the cause. If your condition is caused by a bacterial infection, oral antibiotic medicine may be prescribed. Inform your sexual partner or partners if you test positive for an STI. They may need to be treated. Do not engage in sexual activity with your partner or partners until their treatment is completed. Contact a health care provider if your symptoms do not improve within 3 days. This information is not intended to replace advice given to you by your health care provider. Make sure you discuss any questions you have with your  health care provider. Document Revised: 10/25/2020 Document Reviewed: 10/25/2020 Elsevier Patient Education  2024 [employer]    Signed,   Caro Christmas, MD [facility], [facility] 07/25/23 11:12 AM

## 2023-07-26 LAB — URINE CULTURE
MICRO NUMBER:: 16376663
Result:: NO GROWTH
SPECIMEN QUALITY:: ADEQUATE

## 2023-07-28 ENCOUNTER — Encounter: Payer: Self-pay | Admitting: Family Medicine

## 2023-07-28 LAB — URINE CYTOLOGY ANCILLARY ONLY
Chlamydia: NEGATIVE
Comment: NEGATIVE
Comment: NEGATIVE
Comment: NORMAL
Neisseria Gonorrhea: NEGATIVE
Trichomonas: NEGATIVE

## 2023-07-30 ENCOUNTER — Other Ambulatory Visit: Payer: Self-pay | Admitting: Physician Assistant

## 2023-08-05 ENCOUNTER — Telehealth: Payer: Self-pay | Admitting: Family Medicine

## 2023-08-05 NOTE — Telephone Encounter (Signed)
 Orders was sent from [facility] and needs signatures and sent back to its designated place. Paper work is placed in [employer] loss [employer].  Please advise, Thanks

## 2023-08-05 NOTE — Telephone Encounter (Signed)
Placed in folder at [employer].

## 2023-08-06 NOTE — Telephone Encounter (Signed)
 No additional diagnoses entered. Paperwork completed and will be placed in fax bin at back nurse station

## 2023-08-11 ENCOUNTER — Encounter (HOSPITAL_COMMUNITY): Payer: Self-pay

## 2023-08-13 ENCOUNTER — Other Ambulatory Visit: Payer: BC Managed Care – PPO

## 2023-08-14 ENCOUNTER — Other Ambulatory Visit: Payer: Self-pay | Admitting: Physician Assistant

## 2023-08-18 ENCOUNTER — Ambulatory Visit: Payer: BC Managed Care – PPO | Admitting: Cardiology

## 2023-08-18 ENCOUNTER — Encounter: Payer: Self-pay | Admitting: Family Medicine

## 2023-08-18 ENCOUNTER — Ambulatory Visit: Admitting: Family Medicine

## 2023-08-18 VITALS — BP 116/68 | HR 56 | Temp 98.0°F | Resp 17 | Ht 72.0 in | Wt 223.8 lb

## 2023-08-18 DIAGNOSIS — E039 Hypothyroidism, unspecified: Secondary | ICD-10-CM | POA: Diagnosis not present

## 2023-08-18 DIAGNOSIS — E1165 Type 2 diabetes mellitus with hyperglycemia: Secondary | ICD-10-CM | POA: Diagnosis not present

## 2023-08-18 DIAGNOSIS — N50811 Right testicular pain: Secondary | ICD-10-CM | POA: Diagnosis not present

## 2023-08-18 DIAGNOSIS — E782 Mixed hyperlipidemia: Secondary | ICD-10-CM

## 2023-08-18 DIAGNOSIS — F17210 Nicotine dependence, cigarettes, uncomplicated: Secondary | ICD-10-CM

## 2023-08-18 DIAGNOSIS — Z7984 Long term (current) use of oral hypoglycemic drugs: Secondary | ICD-10-CM

## 2023-08-18 LAB — MICROALBUMIN / CREATININE URINE RATIO
Creatinine,U: 97.8 mg/dL
Microalb Creat Ratio: 9.3 mg/g (ref 0.0–30.0)
Microalb, Ur: 0.9 mg/dL (ref 0.0–1.9)

## 2023-08-18 MED ORDER — DAPAGLIFLOZIN PRO-METFORMIN ER 5-1000 MG PO TB24: 2.0000 | ORAL_TABLET | Freq: Every day | ORAL | 1 refills | Status: AC

## 2023-08-18 MED ORDER — LEVOTHYROXINE SODIUM 150 MCG PO TABS: 150.0000 ug | ORAL_TABLET | Freq: Every day | ORAL | 1 refills | Status: DC

## 2023-08-18 NOTE — Progress Notes (Signed)
 Subjective:  Patient ID: Robert Wise, male    DOB: [birth year]  Age: 63 y.o. MRN: [id number]  CC:  Chief Complaint  Patient presents with   Testicle Pain    Pt is doing well no questions at this time    Medical Management of Chronic Issues    HPI Robert Wise presents for  Follow-up  Right testicular pain Evaluated April 25 after initial ER visit.  Treated with Toradol , Motrin initially, then I treated him for possible epididymitis with Rocephin  1 g then doxycycline  100 mg twice daily.  STI screening with urine cytology was negative/normal.  Urine culture no growth.   Improved in a week, no recurrence.   Diabetes: With prior hyperglycemia, treated with Xigduo , intermittent dosing previously.  He is on statin and ARB.  No new med side effects. Home readings around 200.  No lows.  Taking xigduo  - taking 2 per day "when I need it" - taking 4 days per week. Takes if over 200.  Microalbumin: Due. Optho, foot exam, pneumovax:  Eye exam in June last year - [facility].   Lung ca screening - declines.  Still smoking, declines cessation at this time.  Advised to let me know when he is ready and I am happy to help.  Negative impacts on health have been discussed.  Lab Results  Component Value Date   HGBA1C 7.1 (H) 09/23/2022   HGBA1C 6.6 (H) 04/29/2022   HGBA1C 7.2 (H) 02/25/2022   Lab Results  Component Value Date   MICROALBUR 3.7 (H) 07/02/2021   LDLCALC 99 09/23/2022   CREATININE 1.06 09/23/2022   Hypertension: Seen by cardiology previously with carotid artery stenosis, prior carotid endarterectomy.  Hypertension treated with Benicar  HCT.  Elevated readings at time of testicular pain in April.  Normal today. Home readings: none.  BP Readings from Last 3 Encounters:  08/18/23 116/68  07/25/23 (!) 160/70  07/23/23 (!) 158/75   Lab Results  Component Value Date   CREATININE 1.06 09/23/2022   Hyperlipidemia: Treated with Lipitor  - daily . prior elevated LFTs  evaluated by GI.  Normal LFTs last June. Lab Results  Component Value Date   CHOL 158 09/23/2022   HDL 23.60 (L) 09/23/2022   LDLCALC 99 09/23/2022   LDLDIRECT 80.0 07/02/2021   TRIG 178.0 (H) 09/23/2022   CHOLHDL 7 09/23/2022   Lab Results  Component Value Date   ALT 21 09/23/2022   AST 16 09/23/2022   ALKPHOS 87 09/23/2022   BILITOT 0.5 09/23/2022   Hypothyroidism: Lab Results  Component Value Date   TSH 2.49 09/23/2022  Synthroid  150 mcg daily. Taking medication daily.  No new hot or cold intolerance. No new hair or skin changes, heart palpitations or new fatigue. No new weight changes.    He is followed by psychiatry for depression, meds managed by psychiatry.   History Patient Active Problem List   Diagnosis Date Noted   History of right-sided carotid endarterectomy 05/15/2022 08/20/2022   Asymptomatic carotid artery stenosis without infarction, right 05/15/2022   Anxiety state 01/25/2018   Fatigue 01/25/2018   Uncontrolled type 2 diabetes mellitus with complication, without long-term current use of insulin  02/10/2015   Depression 10/12/2014   Past Medical History:  Diagnosis Date   Anxiety    Depression    Diabetes mellitus without complication (HCC)    GERD (gastroesophageal reflux disease)    Hyperlipidemia    Hypertension    Hypothyroidism    OSA (obstructive sleep apnea) 12/15/2012  Sleep apnea    Thyroid  disease    Past Surgical History:  Procedure Laterality Date   ANKLE SURGERY Left 04/01/2004   CATARACT EXTRACTION Right 04/2021   ENDARTERECTOMY Right 05/15/2022   Procedure: RIGHT ENDARTERECTOMY CAROTID;  Surgeon: Carlene Che, MD;  [facility] OR;  Service: Vascular;  Laterality: Right;   PATCH ANGIOPLASTY Right 05/15/2022   Procedure: BOVINE PATCH ANGIOPLASTY OF RIGHT CAROTID ARTERY;  Surgeon: Carlene Che, MD;  Location: [facility] OR;  Service: Vascular;  Laterality: Right;   VASECTOMY     Allergies  Allergen Reactions   Tricor  [Fenofibrate] Other (See Comments)    Unk reaction   Zocor [Simvastatin] Other (See Comments)    UNK reaction   Prior to Admission medications   Medication Sig Start Date End Date Taking? Authorizing Provider  aspirin  (ASPIRIN  CHILDRENS) 81 MG chewable tablet Chew 1 tablet (81 mg total) by mouth daily. 12/06/20  Yes Knox Perl, MD  atorvastatin  (LIPITOR ) 80 MG tablet TAKE 1 TABLET BY MOUTH EVERY DAY 07/21/23  Yes Benjiman Bras, MD  B Complex Vitamins (B-COMPLEX/B-12 PO) Take 1 tablet by mouth daily.   Yes [provider]  Blood Glucose Monitoring Suppl (ONE TOUCH ULTRA SYSTEM KIT) W/DEVICE KIT 1 kit by Does not apply route once. 10/25/11  Yes Weber, Sarah L, PA-C  busPIRone  (BUSPAR ) 30 MG tablet TAKE 1 TABLET BY MOUTH 2 TIMES DAILY. 06/18/23  Yes Hurst, Rogene Claude, PA-C  Cholecalciferol (VITAMIN D ) 125 MCG (5000 UT) CAPS Take 5,000 Units by mouth daily.   Yes [provider]  Dapagliflozin  Pro-metFORMIN  ER (XIGDUO  XR) 07-998 MG TB24 Take 2 tablets by mouth daily. 09/23/22  Yes Benjiman Bras, MD  doxycycline  (VIBRA -TABS) 100 MG tablet Take 1 tablet (100 mg total) by mouth 2 (two) times daily. 07/25/23  Yes Benjiman Bras, MD  esomeprazole  (NEXIUM ) 40 MG capsule Take 1 capsule (40 mg total) by mouth daily. Patient taking differently: Take 20 mg by mouth daily. otc 04/11/17  Yes Adolph Hoop, PA-C  ferrous sulfate  325 (65 FE) MG tablet Take 325 mg by mouth daily with breakfast.   Yes [provider]  fish oil-omega-3 fatty acids 1000 MG capsule Take 2 g by mouth daily.   Yes [provider]  GINKGO BILOBA EXTRACT PO Take 120 mg by mouth daily.   Yes [provider]  glucose blood (ONE TOUCH ULTRA TEST) test strip 1 each by Other route daily. 12/01/14  Yes Adolph Hoop, PA-C  HYDROcodone -acetaminophen  (NORCO/VICODIN) 5-325 MG tablet Take 1 tablet by mouth every 6 (six) hours as needed for moderate pain (pain score 4-6). 07/25/23  Yes Benjiman Bras, MD   lamoTRIgine  (LAMICTAL ) 200 MG tablet TAKE 1 TABLET BY MOUTH TWICE A DAY 08/14/23  Yes Hurst, Teresa T, PA-C  Lancets [facility] ULTRASOFT) lancets USE AS INSTRUCTED 11/28/13  Yes Dain Drown, MD  levothyroxine  (SYNTHROID ) 150 MCG tablet Take 1 tablet (150 mcg total) by mouth daily before breakfast. 07/22/23  Yes Benjiman Bras, MD  neomycin-bacitracin-polymyxin (NEOSPORIN) OINT Apply 1 Application topically daily as needed for wound care.   Yes [provider]  olmesartan -hydrochlorothiazide  (BENICAR  HCT) 40-12.5 MG tablet Take 1 tablet by mouth in the morning. Daily. Please keep scheduled appointment for future refills. Thank you. 05/06/23  Yes Knox Perl, MD  PARoxetine  (PAXIL ) 30 MG tablet TAKE 2 TABLETS BY MOUTH DAILY 07/31/23  Yes Hurst, Rogene Claude, PA-C  UNABLE TO FIND CPAP   Yes [provider]  vitamin B-12 (CYANOCOBALAMIN) 500 MCG tablet Take 500 mcg by mouth daily.   Yes [provider]   Social History   Socioeconomic History   Marital status: Married    Spouse name: Kathaleen Pale   Number of children: 3   Years of education: 11.5   Highest education level: Not on file  Occupational History    Employer: [employer]    Comment: Driver  Tobacco Use   Smoking status: Every Day    Current packs/day: 1.50    Average packs/day: 1.5 packs/day for 37.5 years (56.2 ttl pk-yrs)    Types: Cigarettes    Start date: 04/02/2019   Smokeless tobacco: Never  Vaping Use   Vaping status: Never Used  Substance and Sexual Activity   Alcohol use: No    Comment: quit: 08/14/2011   Drug use: No   Sexual activity: Yes    Birth control/protection: None  Other Topics Concern   Not on file  Social History Narrative   Patient lives at home with family.    Caffeine Use: 1 pot of coffee daily   Social Drivers of Corporate investment banker Strain: Not on file  Food Insecurity: No Food Insecurity (05/17/2022)   Hunger Vital Sign    Worried About Running Out of Food in the  Last Year: Never true    Ran Out of Food in the Last Year: Never true  Transportation Needs: No Transportation Needs (05/17/2022)   PRAPARE - Administrator, Civil Service (Medical): No    Lack of Transportation (Non-Medical): No  Physical Activity: Not on file  Stress: Not on file  Social Connections: Not on file  Intimate Partner Violence: Not on file    Review of Systems  Constitutional:  Negative for fatigue and unexpected weight change.  Eyes:  Negative for visual disturbance.  Respiratory:  Negative for cough, chest tightness and shortness of breath.   Cardiovascular:  Negative for chest pain, palpitations and leg swelling.  Gastrointestinal:  Negative for abdominal pain and blood in stool.  Neurological:  Negative for dizziness, light-headedness and headaches.     Objective:   Vitals:   08/18/23 1353  BP: 116/68  Pulse: (!) 56  Resp: 17  Temp: 98 F (36.7 C)  TempSrc: Temporal  SpO2: 99%  Weight: 223 lb 12.8 oz (101.5 kg)  Height: 6' (1.829 m)     Physical Exam Vitals reviewed.  Constitutional:      Appearance: He is well-developed.  HENT:     Head: Normocephalic and atraumatic.  Neck:     Vascular: No carotid bruit or JVD.  Cardiovascular:     Rate and Rhythm: Normal rate and regular rhythm.     Heart sounds: Normal heart sounds. No murmur heard. Pulmonary:     Effort: Pulmonary effort is normal.     Breath sounds: Normal breath sounds. No rales.  Musculoskeletal:     Right lower leg: No edema.     Left lower leg: No edema.  Skin:    General: Skin is warm and dry.  Neurological:     Mental Status: He is alert and oriented to person, place, and time.  Psychiatric:        Mood and Affect: Mood normal.        Assessment & Plan:  Robert Wise is a 63 y.o. male . Testicular pain, right  - Suspected epididymitis, resolved.  RTC precautions given.  Type 2 diabetes mellitus with hyperglycemia, without long-term  current use of  insulin  (HCC) - Plan: Hemoglobin A1c, Microalbumin / creatinine urine ratio, Dapagliflozin  Pro-metFORMIN  ER (XIGDUO  XR) 07-998 MG TB24, Comprehensive metabolic panel with GFR  - Importance of consistent use of Xigduo  discussed, uncontrolled with readings in the 200s at home, check A1c and adjust plan accordingly.  29-month follow-up.  Hypothyroidism, unspecified type - Plan: levothyroxine  (SYNTHROID ) 150 MCG tablet, TSH  - Tolerating current med regimen, different generic formulation has been given but same dose, check TSH, continue Synthroid  150 mcg daily.  Mixed hyperlipidemia - Plan: Lipid panel  - Tolerating statin, continue same.  Check lipids and adjust plan accordingly  Cigarette nicotine  dependence without complication  - Not ready to quit, also declines lung cancer screening at this time.  Meds ordered this encounter  Medications   Dapagliflozin  Pro-metFORMIN  ER (XIGDUO  XR) 07-998 MG TB24    Sig: Take 2 tablets by mouth daily.    Dispense:  180 tablet    Refill:  1   levothyroxine  (SYNTHROID ) 150 MCG tablet    Sig: Take 1 tablet (150 mcg total) by mouth daily before breakfast.    Dispense:  90 tablet    Refill:  1   There are no Patient Instructions on file for this visit.    Signed,   Caro Christmas, MD [facility], [facility] 08/18/23 2:17 PM

## 2023-08-18 NOTE — Patient Instructions (Signed)
 Is important that you take your Xigduo  2 pills every day for diabetes control, not just when you see elevated blood sugars.  If you have any side effects with daily use, let me know, but for now try taking it every single day.  No change in other medications.  If any concerns on labs I will let you know.  Take care.

## 2023-08-19 LAB — LIPID PANEL
Cholesterol: 150 mg/dL (ref 0–200)
HDL: 23.8 mg/dL — ABNORMAL LOW (ref >39.00)
NonHDL: 125.88
Total CHOL/HDL Ratio: 6
Triglycerides: 446 mg/dL — ABNORMAL HIGH (ref 0.0–149.0)
VLDL: 89.2 mg/dL — ABNORMAL HIGH (ref 0.0–40.0)

## 2023-08-19 LAB — COMPREHENSIVE METABOLIC PANEL WITH GFR
ALT: 57 U/L — ABNORMAL HIGH (ref 0–53)
AST: 27 U/L (ref 0–37)
Albumin: 4.6 g/dL (ref 3.5–5.2)
Alkaline Phosphatase: 76 U/L (ref 39–117)
BUN: 24 mg/dL — ABNORMAL HIGH (ref 6–23)
CO2: 25 meq/L (ref 19–32)
Calcium: 9.4 mg/dL (ref 8.4–10.5)
Chloride: 101 meq/L (ref 96–112)
Creatinine, Ser: 1.06 mg/dL (ref 0.40–1.50)
GFR: 74.97 mL/min (ref >60.00)
Glucose, Bld: 133 mg/dL — ABNORMAL HIGH (ref 70–99)
Potassium: 4.8 meq/L (ref 3.5–5.1)
Sodium: 136 meq/L (ref 135–145)
Total Bilirubin: 0.4 mg/dL (ref 0.2–1.2)
Total Protein: 7.5 g/dL (ref 6.0–8.3)

## 2023-08-19 LAB — LDL CHOLESTEROL, DIRECT: Direct LDL: 82 mg/dL

## 2023-08-19 LAB — HEMOGLOBIN A1C: Hgb A1c MFr Bld: 8.2 % — ABNORMAL HIGH (ref 4.6–6.5)

## 2023-08-19 LAB — TSH: TSH: 1.62 u[IU]/mL (ref 0.35–5.50)

## 2023-08-20 ENCOUNTER — Ambulatory Visit (HOSPITAL_COMMUNITY): Admission: RE | Admit: 2023-08-20 | Payer: BC Managed Care – PPO | Source: Ambulatory Visit

## 2023-08-23 ENCOUNTER — Ambulatory Visit: Payer: Self-pay | Admitting: Family Medicine

## 2023-08-26 ENCOUNTER — Ambulatory Visit (INDEPENDENT_AMBULATORY_CARE_PROVIDER_SITE_OTHER): Payer: BC Managed Care – PPO | Admitting: Physician Assistant

## 2023-08-26 DIAGNOSIS — Z91199 Patient's noncompliance with other medical treatment and regimen due to unspecified reason: Secondary | ICD-10-CM

## 2023-08-27 NOTE — Progress Notes (Signed)
 No show

## 2023-09-08 ENCOUNTER — Other Ambulatory Visit: Payer: Self-pay | Admitting: Vascular Surgery

## 2023-09-08 ENCOUNTER — Ambulatory Visit (HOSPITAL_COMMUNITY)
Admission: RE | Admit: 2023-09-08 | Discharge: 2023-09-08 | Disposition: A | Discharge status: Home / Self Care | Source: Ambulatory Visit | Attending: Surgery | Admitting: Surgery

## 2023-09-08 DIAGNOSIS — I6523 Occlusion and stenosis of bilateral carotid arteries: Secondary | ICD-10-CM

## 2023-09-08 DIAGNOSIS — Z9889 Other specified postprocedural states: Secondary | ICD-10-CM | POA: Diagnosis not present

## 2023-09-16 ENCOUNTER — Other Ambulatory Visit: Payer: Self-pay | Admitting: Physician Assistant

## 2023-09-20 ENCOUNTER — Other Ambulatory Visit: Payer: Self-pay | Admitting: Physician Assistant

## 2023-10-02 ENCOUNTER — Other Ambulatory Visit: Payer: Self-pay | Admitting: Physician Assistant

## 2023-10-06 NOTE — Telephone Encounter (Signed)
Sent MyChart message to schedule FU, was a no show last appt.

## 2023-10-07 ENCOUNTER — Other Ambulatory Visit: Payer: Self-pay

## 2023-10-07 DIAGNOSIS — I10 Essential (primary) hypertension: Secondary | ICD-10-CM

## 2023-10-07 MED ORDER — OLMESARTAN MEDOXOMIL-HCTZ 40-12.5 MG PO TABS: 1.0000 | ORAL_TABLET | Freq: Every morning | ORAL | 0 refills | Status: DC

## 2023-10-13 ENCOUNTER — Ambulatory Visit (INDEPENDENT_AMBULATORY_CARE_PROVIDER_SITE_OTHER): Admitting: Physician Assistant

## 2023-10-13 ENCOUNTER — Encounter: Payer: Self-pay | Admitting: Physician Assistant

## 2023-10-13 DIAGNOSIS — F17200 Nicotine dependence, unspecified, uncomplicated: Secondary | ICD-10-CM

## 2023-10-13 DIAGNOSIS — F411 Generalized anxiety disorder: Secondary | ICD-10-CM | POA: Diagnosis not present

## 2023-10-13 DIAGNOSIS — F3341 Major depressive disorder, recurrent, in partial remission: Secondary | ICD-10-CM

## 2023-10-13 DIAGNOSIS — G4733 Obstructive sleep apnea (adult) (pediatric): Secondary | ICD-10-CM | POA: Diagnosis not present

## 2023-10-13 MED ORDER — PAROXETINE HCL 30 MG PO TABS: 60.0000 mg | ORAL_TABLET | Freq: Every day | ORAL | 1 refills | Status: DC

## 2023-10-13 MED ORDER — BUSPIRONE HCL 30 MG PO TABS: 30.0000 mg | ORAL_TABLET | Freq: Two times a day (BID) | ORAL | 1 refills | Status: DC

## 2023-10-13 MED ORDER — LAMOTRIGINE 200 MG PO TABS: 200.0000 mg | ORAL_TABLET | Freq: Two times a day (BID) | ORAL | 1 refills | Status: DC

## 2023-10-13 NOTE — Progress Notes (Signed)
 [facility]  Patient ID: Robert Wise,  MRN: [id number]  PCP: Levora Reyes SAUNDERS, MD  Date of Evaluation: 10/13/2023 Time spent:20 minutes  Chief Complaint:  Chief Complaint   Anxiety; Depression; Follow-up    HISTORY/CURRENT STATUS: HPI For routine med check.  Doing well for the most part.  Patient is able to enjoy things.  Energy and motivation are good.  Work is going well.   No extreme sadness, tearfulness, or feelings of hopelessness.  Sleeps well, uses CPAP.  ADLs and personal hygiene are normal.   Denies any changes in concentration, making decisions, or remembering things.  Appetite has not changed.  Weight is stable.  Occas anxiety but able to work through it.  Hasn't needed Valium  in a long time.  No mania, psychosis, delirium.  Denies suicidal or homicidal thoughts.  Denies dizziness, syncope, seizures, numbness, tingling, tremor, tics, unsteady gait, slurred speech, confusion. Denies muscle or joint pain, stiffness, or dystonia.Denies unexplained weight loss, frequent infections, or sores that heal slowly.  No polyphagia, polydipsia, or polyuria. Denies visual changes or paresthesias.   Individual Medical History/ Review of Systems: Changes? 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.) Hydroxyzine  caused too much sedation  Allergies: Tricor [fenofibrate] and Zocor [simvastatin]  Current Medications:  Current Outpatient Medications:    aspirin  (ASPIRIN  CHILDRENS) 81 MG chewable tablet, Chew 1 tablet (81 mg total) by mouth daily., Disp: , Rfl:    atorvastatin  (LIPITOR ) 80 MG tablet, TAKE 1 TABLET BY MOUTH EVERY DAY, Disp: 90 tablet, Rfl: 2   B Complex Vitamins (B-COMPLEX/B-12 PO), Take 1 tablet  by mouth daily., Disp: , Rfl:    Blood Glucose Monitoring Suppl (ONE TOUCH ULTRA SYSTEM KIT) W/DEVICE KIT, 1 kit by Does not apply route once., Disp: 1 each, Rfl: 0   Cholecalciferol (VITAMIN D ) 125 MCG (5000 UT) CAPS, Take 5,000 Units by mouth daily., Disp: , Rfl:    Dapagliflozin  Pro-metFORMIN  ER (XIGDUO  XR) 07-998 MG TB24, Take 2 tablets by mouth daily., Disp: 180 tablet, Rfl: 1   esomeprazole  (NEXIUM ) 40 MG capsule, Take 1 capsule (40 mg total) by mouth daily., Disp: 90 capsule, Rfl: 3   ferrous sulfate  325 (65 FE) MG tablet, Take 325 mg by mouth daily with breakfast., Disp: , Rfl:    fish oil-omega-3 fatty acids 1000 MG capsule, Take 2 g by mouth daily., Disp: , Rfl:    glucose blood (ONE TOUCH ULTRA TEST) test strip, 1 each by Other route daily., Disp: 100 each, Rfl: 8   Lancets (ONETOUCH ULTRASOFT) lancets, USE AS INSTRUCTED, Disp: 100 each, Rfl: 0   levothyroxine  (SYNTHROID ) 150 MCG tablet, Take 1 tablet (150 mcg total) by mouth daily before breakfast., Disp: 90 tablet, Rfl: 1   neomycin-bacitracin-polymyxin (NEOSPORIN) OINT, Apply 1 Application topically daily as needed for wound care., Disp: , Rfl:    olmesartan -hydrochlorothiazide  (BENICAR  HCT) 40-12.5 MG tablet, Take 1 tablet by mouth in the morning., Disp: 30 tablet, Rfl: 0   vitamin B-12 (CYANOCOBALAMIN) 500 MCG tablet, Take 500 mcg by mouth daily., Disp: , Rfl:    busPIRone  (BUSPAR ) 30 MG tablet, Take 1 tablet (30 mg total) by mouth 2 (two) times daily., Disp: 180 tablet, Rfl: 1   GINKGO BILOBA EXTRACT PO, Take  120 mg by mouth daily. (Patient not taking: Reported on 10/13/2023), Disp: , Rfl:    HYDROcodone -acetaminophen  (NORCO/VICODIN) 5-325 MG tablet, Take 1 tablet by mouth every 6 (six) hours as needed for moderate pain (pain score 4-6). (Patient not taking: Reported on 10/13/2023), Disp: 15 tablet, Rfl: 0   lamoTRIgine  (LAMICTAL ) 200 MG tablet, Take 1 tablet (200 mg total) by mouth 2 (two) times daily., Disp: 180 tablet, Rfl: 1    PARoxetine  (PAXIL ) 30 MG tablet, Take 2 tablets (60 mg total) by mouth daily., Disp: 180 tablet, Rfl: 1   UNABLE TO FIND, CPAP, Disp: , Rfl:  Medication Side Effects: none  Family Medical/ Social History: Changes? No  MENTAL HEALTH EXAM:  There were no vitals taken for this visit.There is no height or weight on file to calculate BMI.  General Appearance: Casual, Neat and Well Groomed  Eye Contact:  Good  Speech:  Clear and Coherent and Normal Rate  Volume:  Normal  Mood:  Euthymic  Affect:  Appropriate  Thought Process:  Goal Directed and Descriptions of Associations: Circumstantial  Orientation:  Full (Time, Place, and Person)  Thought Content: Logical   Suicidal Thoughts:  No  Homicidal Thoughts:  No  Memory:  WNL  Judgement:  Good  Insight:  Good  Psychomotor Activity:  Normal  Concentration:  Concentration: Good and Attention Span: Good  Recall:  Good  Fund of Knowledge: Good  Language: Good  Assets:  Communication Skills Desire for Improvement Financial Resources/Insurance Housing Transportation Vocational/Educational  ADL's:  Intact  Cognition: WNL  Prognosis:  Good   DIAGNOSES:    ICD-10-CM   1. Recurrent major depressive disorder, in partial remission (HCC)  F33.41     2. Generalized anxiety disorder  F41.1     3. Smoker  F17.200     4. Obstructive sleep apnea  G47.33       Receiving Psychotherapy: No   RECOMMENDATIONS:  PDMP was reviewed. Valium  filled 04/25/2022.  Hydrocodone  given since last visit. I provided approximately 20 minutes of face to face time during this encounter, including time spent before and after the visit in records review, medical decision making, counseling pertinent to today's visit, and charting.   Hasn't needed Valium  in a long time but it's ok to send in before next OV if needed.   Smoking cessation discussed.   Continue BuSpar  30 mg, 1 p.o. twice daily.       Continue Lamictal  200 mg, 1 p.o. twice daily. Continue Paxil   30 mg, 2 p.o. daily. Continue multivitamin, ginkgo biloba, B complex, fish oil, vitamin D . Continue using CPAP. Return in 6 months.    Verneita Cooks, PA-C

## 2023-11-27 ENCOUNTER — Ambulatory Visit: Admitting: Family Medicine

## 2023-12-08 ENCOUNTER — Encounter: Payer: Self-pay | Admitting: Family Medicine

## 2023-12-08 ENCOUNTER — Ambulatory Visit: Admitting: Family Medicine

## 2023-12-08 VITALS — BP 146/68 | HR 66 | Temp 98.1°F | Resp 20 | Ht 72.0 in | Wt 216.8 lb

## 2023-12-08 DIAGNOSIS — Z7984 Long term (current) use of oral hypoglycemic drugs: Secondary | ICD-10-CM

## 2023-12-08 DIAGNOSIS — F17210 Nicotine dependence, cigarettes, uncomplicated: Secondary | ICD-10-CM

## 2023-12-08 DIAGNOSIS — Z23 Encounter for immunization: Secondary | ICD-10-CM

## 2023-12-08 DIAGNOSIS — E1165 Type 2 diabetes mellitus with hyperglycemia: Secondary | ICD-10-CM

## 2023-12-08 DIAGNOSIS — I10 Essential (primary) hypertension: Secondary | ICD-10-CM | POA: Diagnosis not present

## 2023-12-08 LAB — COMPREHENSIVE METABOLIC PANEL WITH GFR
ALT: 16 U/L (ref 0–53)
AST: 17 U/L (ref 0–37)
Albumin: 4.5 g/dL (ref 3.5–5.2)
Alkaline Phosphatase: 69 U/L (ref 39–117)
BUN: 22 mg/dL (ref 6–23)
CO2: 27 meq/L (ref 19–32)
Calcium: 9.8 mg/dL (ref 8.4–10.5)
Chloride: 103 meq/L (ref 96–112)
Creatinine, Ser: 1.28 mg/dL (ref 0.40–1.50)
GFR: 59.66 mL/min — ABNORMAL LOW (ref >60.00)
Glucose, Bld: 144 mg/dL — ABNORMAL HIGH (ref 70–99)
Potassium: 5 meq/L (ref 3.5–5.1)
Sodium: 138 meq/L (ref 135–145)
Total Bilirubin: 0.4 mg/dL (ref 0.2–1.2)
Total Protein: 7.8 g/dL (ref 6.0–8.3)

## 2023-12-08 LAB — HEMOGLOBIN A1C: Hgb A1c MFr Bld: 7.8 % — ABNORMAL HIGH (ref 4.6–6.5)

## 2023-12-08 NOTE — Addendum Note (Signed)
 Addended by: Velicia Dejager R on: 12/08/2023 09:42 AM   Modules accepted: Orders

## 2023-12-08 NOTE — Progress Notes (Signed)
 Subjective:  Patient ID: Robert Wise, male    DOB: 1960/09/30  Age: 63 y.o. MRN: [id number]  CC:  Chief Complaint  Patient presents with   Follow-up    3 month follow up. No questions or concerns.    HPI [name] presents for   Diabetes: With history of hyperglycemia, previous intermittent dosing of Xigduo  likely responsible.  Consistent dosing recommended.  4 days/week and may.  He is on statin and ARB. Home readings - around 200 - fasting.  Taking xigduo   consistently.  No side effects.  No FH of MEN syndrome or medullary thyroid  CA, no personal hx of pancreatitis.  Ozempic  not covered in past.  Microalbumin: Normal ratio 08/18/2023 Optho, foot exam, pneumovax:  Ophthalmology exam: appt last November - [facility].  Foot exam today. Flu vaccine: today.   History of tobacco use, 1ppd, cessation has been discussed but declined.  Also has declined lung cancer screening.  Negative health impacts of smoking have been discussed.  Only occasional HA, relieved with Goody powder. No new or worsening sx's. Not daily.   No home BP's  BP Readings from Last 3 Encounters:  12/08/23 (!) 146/68  08/18/23 116/68  07/25/23 (!) 160/70  No missed doses of HTN meds. Cigarette prior to OV.    Lab Results  Component Value Date   HGBA1C 8.2 (H) 08/18/2023   HGBA1C 7.1 (H) 09/23/2022   HGBA1C 6.6 (H) 04/29/2022   Lab Results  Component Value Date   MICROALBUR 0.9 08/18/2023   LDLCALC 99 09/23/2022   CREATININE 1.06 08/18/2023    History Patient Active Problem List   Diagnosis Date Noted   History of right-sided carotid endarterectomy 05/15/2022 08/20/2022   Asymptomatic carotid artery stenosis without infarction, right 05/15/2022   Anxiety state 01/25/2018   Fatigue 01/25/2018   Uncontrolled type 2 diabetes mellitus with complication, without long-term current use of insulin  02/10/2015   Depression 10/12/2014   Past Medical History:  Diagnosis Date    Anxiety    Depression    Diabetes mellitus without complication (HCC)    GERD (gastroesophageal reflux disease)    Hyperlipidemia    Hypertension    Hypothyroidism    OSA (obstructive sleep apnea) 12/15/2012   Sleep apnea    Thyroid  disease    Past Surgical History:  Procedure Laterality Date   ANKLE SURGERY Left 04/01/2004   CATARACT EXTRACTION Right 04/2021   ENDARTERECTOMY Right 05/15/2022   Procedure: RIGHT ENDARTERECTOMY CAROTID;  Surgeon: Magda Debby SAILOR, MD;  [facility];  Service: Vascular;  Laterality: Right;   PATCH ANGIOPLASTY Right 05/15/2022   Procedure: BOVINE PATCH ANGIOPLASTY OF RIGHT CAROTID ARTERY;  Surgeon: Magda Debby SAILOR, MD;  Location: [facility] OR;  Service: Vascular;  Laterality: Right;   VASECTOMY     Allergies  Allergen Reactions   Tricor [Fenofibrate] Other (See Comments)    Unk reaction   Zocor [Simvastatin] Other (See Comments)    UNK reaction   Prior to Admission medications   Medication Sig Start Date End Date Taking? Authorizing Provider  aspirin  (ASPIRIN  CHILDRENS) 81 MG chewable tablet Chew 1 tablet (81 mg total) by mouth daily. 12/06/20  Yes Ladona Heinz, MD  atorvastatin  (LIPITOR ) 80 MG tablet TAKE 1 TABLET BY MOUTH EVERY DAY 07/21/23  Yes Levora Reyes SAUNDERS, MD  B Complex Vitamins (B-COMPLEX/B-12 PO) Take 1 tablet by mouth daily.   Yes [provider]  Blood Glucose Monitoring Suppl (ONE TOUCH ULTRA SYSTEM KIT) W/DEVICE KIT  1 kit by Does not apply route once. 10/25/11  Yes Weber, Sarah L, PA-C  busPIRone  (BUSPAR ) 30 MG tablet Take 1 tablet (30 mg total) by mouth 2 (two) times daily. 10/13/23  Yes Hurst, Verneita DASEN, PA-C  Cholecalciferol (VITAMIN D ) 125 MCG (5000 UT) CAPS Take 5,000 Units by mouth daily.   Yes [provider]  Dapagliflozin  Pro-metFORMIN  ER (XIGDUO  XR) 07-998 MG TB24 Take 2 tablets by mouth daily. 08/18/23  Yes Levora Reyes SAUNDERS, MD  esomeprazole  (NEXIUM ) 40 MG capsule Take 1 capsule (40 mg total) by mouth daily. 04/11/17   Yes Christopher Savannah, PA-C  ferrous sulfate  325 (65 FE) MG tablet Take 325 mg by mouth daily with breakfast.   Yes [provider]  fish oil-omega-3 fatty acids 1000 MG capsule Take 2 g by mouth daily.   Yes [provider]  glucose blood (ONE TOUCH ULTRA TEST) test strip 1 each by Other route daily. 12/01/14  Yes Christopher Savannah, PA-C  lamoTRIgine  (LAMICTAL ) 200 MG tablet Take 1 tablet (200 mg total) by mouth 2 (two) times daily. 10/13/23  Yes Rhys Verneita T, PA-C  Lancets [facility] ULTRASOFT) lancets USE AS INSTRUCTED 11/28/13  Yes Mario Million, MD  levothyroxine  (SYNTHROID ) 150 MCG tablet Take 1 tablet (150 mcg total) by mouth daily before breakfast. 08/18/23  Yes Levora Reyes SAUNDERS, MD  olmesartan -hydrochlorothiazide  (BENICAR  HCT) 40-12.5 MG tablet Take 1 tablet by mouth in the morning. 10/07/23  Yes Ladona Heinz, MD  PARoxetine  (PAXIL ) 30 MG tablet Take 2 tablets (60 mg total) by mouth daily. 10/13/23  Yes Hurst, Verneita T, PA-C  UNABLE TO FIND CPAP   Yes [provider]  vitamin B-12 (CYANOCOBALAMIN) 500 MCG tablet Take 500 mcg by mouth daily.   Yes [provider]  GINKGO BILOBA EXTRACT PO Take 120 mg by mouth daily. Patient not taking: Reported on 12/08/2023    [provider]  HYDROcodone -acetaminophen  (NORCO/VICODIN) 5-325 MG tablet Take 1 tablet by mouth every 6 (six) hours as needed for moderate pain (pain score 4-6). Patient not taking: Reported on 10/13/2023 07/25/23   Shon Mansouri R, MD  neomycin-bacitracin-polymyxin (NEOSPORIN) OINT Apply 1 Application topically daily as needed for wound care. Patient not taking: Reported on 12/08/2023    [provider]   Social History   Socioeconomic History   Marital status: Married    Spouse name: Bari   Number of children: 3   Years of education: 11.5   Highest education level: 12th grade  Occupational History    Employer: [employer]    Comment: Driver  Tobacco Use   Smoking status: Every Day     Current packs/day: 1.50    Average packs/day: 1.5 packs/day for 37.8 years (56.7 ttl pk-yrs)    Types: Cigarettes    Start date: 04/02/2019   Smokeless tobacco: Never  Vaping Use   Vaping status: Never Used  Substance and Sexual Activity   Alcohol use: No    Comment: quit: 08/14/2011   Drug use: No   Sexual activity: Yes    Birth control/protection: None  Other Topics Concern   Not on file  Social History Narrative   Patient lives at home with family.    Caffeine Use: 1 pot of coffee daily   Social Drivers of Corporate investment banker Strain: Low Risk  (12/07/2023)   Overall Financial Resource Strain (CARDIA)    Difficulty of Paying Living Expenses: Not very hard  Food Insecurity: No Food Insecurity (12/07/2023)  Hunger Vital Sign    Worried About Running Out of Food in the Last Year: Never true    Ran Out of Food in the Last Year: Never true  Transportation Needs: No Transportation Needs (12/07/2023)   PRAPARE - Administrator, Civil Service (Medical): No    Lack of Transportation (Non-Medical): No  Physical Activity: Unknown (12/07/2023)   Exercise Vital Sign    Days of Exercise per Week: Patient declined    Minutes of Exercise per Session: Not on file  Stress: Stress Concern Present (12/07/2023)   [employer] of Occupational Health - Occupational Stress Questionnaire    Feeling of Stress: To some extent  Social Connections: Socially Integrated (12/07/2023)   Social Connection and Isolation Panel    Frequency of Communication with Friends and Family: More than three times a week    Frequency of Social Gatherings with Friends and Family: More than three times a week    Attends Religious Services: More than 4 times per year    Active Member of [employer] or Organizations: Yes    Attends Engineer, structural: More than 4 times per year    Marital Status: Married  Catering manager Violence: Not on file    Review of Systems  Constitutional:  Negative for  fatigue and unexpected weight change.  Eyes:  Negative for visual disturbance.  Respiratory:  Negative for cough, chest tightness and shortness of breath.   Cardiovascular:  Negative for chest pain, palpitations and leg swelling.  Gastrointestinal:  Negative for abdominal pain and blood in stool.  Neurological:  Negative for dizziness, light-headedness and headaches.     Objective:   Vitals:   12/08/23 0832 12/08/23 0919 12/08/23 0920  BP: (!) 120/44 (!) 160/68 (!) 146/68  Pulse: 66    Resp: 20    Temp: 98.1 F (36.7 C)    TempSrc: Temporal    SpO2: 95%    Weight: 216 lb 12.8 oz (98.3 kg)    Height: 6' (1.829 m)       Physical Exam Vitals reviewed.  Constitutional:      Appearance: He is well-developed.  HENT:     Head: Normocephalic and atraumatic.  Neck:     Vascular: No carotid bruit or JVD.  Cardiovascular:     Rate and Rhythm: Normal rate and regular rhythm.     Heart sounds: Normal heart sounds. No murmur heard. Pulmonary:     Effort: Pulmonary effort is normal.     Breath sounds: Normal breath sounds. No rales.  Musculoskeletal:     Right lower leg: No edema.     Left lower leg: No edema.  Skin:    General: Skin is warm and dry.  Neurological:     Mental Status: He is alert and oriented to person, place, and time.  Psychiatric:        Mood and Affect: Mood normal.     Assessment & Plan:  RANDOL ZUMSTEIN is a 63 y.o. male . Type 2 diabetes mellitus with hyperglycemia, without long-term current use of insulin  (HCC)  - With hyperglycemia, decreased control based on home readings.  Likely will add to Mounjaro, demonstration device reviewed in office.  All questions answered.  Will wait on labs first.  Continue Xigduo  for now.  Commended on improved compliance.  Primary hypertension  - Variable readings, possible elevated reading in office with smoking just prior to visit.  Home monitoring discussed with plan to advise me of those readings.  Question  whitecoat hypertension component.  79-month follow-up.  Cigarette nicotine  dependence without complication  - Cessation discussed, declined at this time.  No orders of the defined types were placed in this encounter.  Patient Instructions  Keep a record of your blood pressures outside of the office and send me a record in next 2 weeks.  We may need to add a new med for diabetes - consider Mounjaro that we demonstrated today.  I will let you know once I review labs.  See information below on how to best check blood pressure at home.  How to Take Your Blood Pressure Blood pressure is a measurement of how strongly your blood is pressing against the walls of your arteries. Arteries are blood vessels that carry blood from your heart throughout your body. Your health care provider takes your blood pressure at each office visit. You can also take your own blood pressure at home with a blood pressure monitor. You may need to take your own blood pressure to: Confirm a diagnosis of high blood pressure (hypertension). Monitor your blood pressure over time. Make sure your blood pressure medicine is working. Supplies needed: Blood pressure monitor. A chair to sit in. This should be a chair where you can sit upright with your back supported. Do not sit on a soft couch or an armchair. Table or desk. Small notebook and pencil or pen. How to prepare To get the most accurate reading, avoid the following for 30 minutes before you check your blood pressure: Drinking caffeine. Drinking alcohol. Eating. Smoking. Exercising. Five minutes before you check your blood pressure: Use the bathroom and urinate so that you have an empty bladder. Sit quietly in a chair. Do not talk. How to take your blood pressure To check your blood pressure, follow the instructions in the manual that came with your blood pressure monitor. If you have a digital blood pressure monitor, the instructions may be as follows: Sit up  straight in a chair. Place your feet on the floor. Do not cross your ankles or legs. Rest your left arm at the level of your heart on a table or desk or on the arm of a chair. Pull up your shirt sleeve. Wrap the blood pressure cuff around the upper part of your left arm, 1 inch (2.5 cm) above your elbow. It is best to wrap the cuff around bare skin. Fit the cuff snugly, but not too tightly, around your arm. You should be able to place only one finger between the cuff and your arm. Position the cord so that it rests in the bend of your elbow. Press the power button. Sit quietly while the cuff inflates and deflates. Read the digital reading on the monitor screen and write the numbers down (record them) in a notebook. Wait 2-3 minutes, then repeat the steps, starting at step 1. What does my blood pressure reading mean? A blood pressure reading consists of a higher number over a lower number. Ideally, your blood pressure should be below 120/80. The first (top) number is called the systolic pressure. It is a measure of the pressure in your arteries as your heart beats. The second (bottom) number is called the diastolic pressure. It is a measure of the pressure in your arteries as the heart relaxes. Blood pressure is classified into four stages. The following are the stages for adults who do not have a short-term serious illness or a chronic condition. Systolic pressure and diastolic pressure are measured in a unit  called mm Hg (millimeters of mercury).  Normal Systolic pressure: below 120. Diastolic pressure: below 80. Elevated Systolic pressure: 120-129. Diastolic pressure: below 80. Hypertension stage 1 Systolic pressure: 130-139. Diastolic pressure: 80-89. Hypertension stage 2 Systolic pressure: 140 or above. Diastolic pressure: 90 or above. You can have elevated blood pressure or hypertension even if only the systolic or only the diastolic number in your reading is higher than  normal. Follow these instructions at home: Medicines Take over-the-counter and prescription medicines only as told by your health care provider. Tell your health care provider if you are having any side effects from blood pressure medicine. General instructions Check your blood pressure as often as recommended by your health care provider. Check your blood pressure at the same time every day. Take your monitor to the next appointment with your health care provider to make sure that: You are using it correctly. It provides accurate readings. Understand what your goal blood pressure numbers are. Keep all follow-up visits. This is important. General tips Your health care provider can suggest a reliable monitor that will meet your needs. There are several types of home blood pressure monitors. Choose a monitor that has an arm cuff. Do not choose a monitor that measures your blood pressure from your wrist or finger. Choose a cuff that wraps snugly, not too tight or too loose, around your upper arm. You should be able to fit only one finger between your arm and the cuff. You can buy a blood pressure monitor at most drugstores or online. Where to find more information American Heart Association: www.heart.org Contact a health care provider if: Your blood pressure is consistently high. Your blood pressure is suddenly low. Get help right away if: Your systolic blood pressure is higher than 180. Your diastolic blood pressure is higher than 120. These symptoms may be an emergency. Get help right away. Call 911. Do not wait to see if the symptoms will go away. Do not drive yourself to the hospital. Summary Blood pressure is a measurement of how strongly your blood is pressing against the walls of your arteries. A blood pressure reading consists of a higher number over a lower number. Ideally, your blood pressure should be below 120/80. Check your blood pressure at the same time every day. Avoid  caffeine, alcohol, smoking, and exercise for 30 minutes prior to checking your blood pressure. These agents can affect the accuracy of the blood pressure reading. This information is not intended to replace advice given to you by your health care provider. Make sure you discuss any questions you have with your health care provider. Document Revised: 11/30/2020 Document Reviewed: 11/30/2020 Elsevier Patient Education  2024 Elsevier Inc.    Signed,   Reyes Pines, MD [facility], [facility] 12/08/23 9:32 AM

## 2023-12-08 NOTE — Patient Instructions (Addendum)
 Keep a record of your blood pressures outside of the office and send me a record in next 2 weeks.  We may need to add a new med for diabetes - consider Mounjaro that we demonstrated today.  I will let you know once I review labs.  See information below on how to best check blood pressure at home.  How to Take Your Blood Pressure Blood pressure is a measurement of how strongly your blood is pressing against the walls of your arteries. Arteries are blood vessels that carry blood from your heart throughout your body. Your health care provider takes your blood pressure at each office visit. You can also take your own blood pressure at home with a blood pressure monitor. You may need to take your own blood pressure to: Confirm a diagnosis of high blood pressure (hypertension). Monitor your blood pressure over time. Make sure your blood pressure medicine is working. Supplies needed: Blood pressure monitor. A chair to sit in. This should be a chair where you can sit upright with your back supported. Do not sit on a soft couch or an armchair. Table or desk. Small notebook and pencil or pen. How to prepare To get the most accurate reading, avoid the following for 30 minutes before you check your blood pressure: Drinking caffeine. Drinking alcohol. Eating. Smoking. Exercising. Five minutes before you check your blood pressure: Use the bathroom and urinate so that you have an empty bladder. Sit quietly in a chair. Do not talk. How to take your blood pressure To check your blood pressure, follow the instructions in the manual that came with your blood pressure monitor. If you have a digital blood pressure monitor, the instructions may be as follows: Sit up straight in a chair. Place your feet on the floor. Do not cross your ankles or legs. Rest your left arm at the level of your heart on a table or desk or on the arm of a chair. Pull up your shirt sleeve. Wrap the blood pressure cuff around the  upper part of your left arm, 1 inch (2.5 cm) above your elbow. It is best to wrap the cuff around bare skin. Fit the cuff snugly, but not too tightly, around your arm. You should be able to place only one finger between the cuff and your arm. Position the cord so that it rests in the bend of your elbow. Press the power button. Sit quietly while the cuff inflates and deflates. Read the digital reading on the monitor screen and write the numbers down (record them) in a notebook. Wait 2-3 minutes, then repeat the steps, starting at step 1. What does my blood pressure reading mean? A blood pressure reading consists of a higher number over a lower number. Ideally, your blood pressure should be below 120/80. The first (top) number is called the systolic pressure. It is a measure of the pressure in your arteries as your heart beats. The second (bottom) number is called the diastolic pressure. It is a measure of the pressure in your arteries as the heart relaxes. Blood pressure is classified into four stages. The following are the stages for adults who do not have a short-term serious illness or a chronic condition. Systolic pressure and diastolic pressure are measured in a unit called mm Hg (millimeters of mercury).  Normal Systolic pressure: below 120. Diastolic pressure: below 80. Elevated Systolic pressure: 120-129. Diastolic pressure: below 80. Hypertension stage 1 Systolic pressure: 130-139. Diastolic pressure: 80-89. Hypertension stage 2 Systolic pressure:  140 or above. Diastolic pressure: 90 or above. You can have elevated blood pressure or hypertension even if only the systolic or only the diastolic number in your reading is higher than normal. Follow these instructions at home: Medicines Take over-the-counter and prescription medicines only as told by your health care provider. Tell your health care provider if you are having any side effects from blood pressure medicine. General  instructions Check your blood pressure as often as recommended by your health care provider. Check your blood pressure at the same time every day. Take your monitor to the next appointment with your health care provider to make sure that: You are using it correctly. It provides accurate readings. Understand what your goal blood pressure numbers are. Keep all follow-up visits. This is important. General tips Your health care provider can suggest a reliable monitor that will meet your needs. There are several types of home blood pressure monitors. Choose a monitor that has an arm cuff. Do not choose a monitor that measures your blood pressure from your wrist or finger. Choose a cuff that wraps snugly, not too tight or too loose, around your upper arm. You should be able to fit only one finger between your arm and the cuff. You can buy a blood pressure monitor at most drugstores or online. Where to find more information American Heart Association: www.heart.org Contact a health care provider if: Your blood pressure is consistently high. Your blood pressure is suddenly low. Get help right away if: Your systolic blood pressure is higher than 180. Your diastolic blood pressure is higher than 120. These symptoms may be an emergency. Get help right away. Call 911. Do not wait to see if the symptoms will go away. Do not drive yourself to the hospital. Summary Blood pressure is a measurement of how strongly your blood is pressing against the walls of your arteries. A blood pressure reading consists of a higher number over a lower number. Ideally, your blood pressure should be below 120/80. Check your blood pressure at the same time every day. Avoid caffeine, alcohol, smoking, and exercise for 30 minutes prior to checking your blood pressure. These agents can affect the accuracy of the blood pressure reading. This information is not intended to replace advice given to you by your health care  provider. Make sure you discuss any questions you have with your health care provider. Document Revised: 11/30/2020 Document Reviewed: 11/30/2020 Elsevier Patient Education  2024 [employer].

## 2023-12-13 ENCOUNTER — Ambulatory Visit: Payer: Self-pay | Admitting: Family Medicine

## 2023-12-29 NOTE — Progress Notes (Signed)
 CODI KERTZ                                          MRN: [id number]   12/29/2023   The [facility] Quality Team Specialist reviewed this patient medical record for the purposes of chart review for care gap closure. The following were reviewed: abstraction for care gap closure-glycemic status assessment.    [facility] Quality Team

## 2024-01-05 ENCOUNTER — Telehealth: Payer: Self-pay

## 2024-01-05 ENCOUNTER — Other Ambulatory Visit: Payer: Self-pay

## 2024-01-05 DIAGNOSIS — I10 Essential (primary) hypertension: Secondary | ICD-10-CM

## 2024-01-05 DIAGNOSIS — E1165 Type 2 diabetes mellitus with hyperglycemia: Secondary | ICD-10-CM

## 2024-01-05 NOTE — Telephone Encounter (Signed)
 Called pt and he agrees to the [facility] rx. Advised pt we will contact him when this has been sent in

## 2024-01-05 NOTE — Telephone Encounter (Signed)
 Copied from CRM [id number]. Topic: Clinical - Medication Question >> Jan 05, 2024  3:05 PM Shereese L wrote: Reason for CRM: patient is calling in because he was adv that he was suppose to receive an injection medication that helps with diabetes and weight loss. It could ozempic  but unsure and would like a call back

## 2024-01-05 NOTE — Telephone Encounter (Signed)
 Per last ov 12/2023  PCP note to pt  Patient Instructions  Keep a record of your blood pressures outside of the office and send me a record in next 2 weeks.  We may need to add a new med for diabetes - consider Mounjaro that we demonstrated today.  I will let you know once I review labs.  Please advise

## 2024-01-06 MED ORDER — TIRZEPATIDE 2.5 MG/0.5ML ~~LOC~~ SOAJ: 2.5000 mg | SUBCUTANEOUS | 1 refills | Status: DC

## 2024-01-06 NOTE — Addendum Note (Signed)
 Addended by: Myrl Bynum R on: 01/06/2024 11:02 AM   Modules accepted: Orders

## 2024-01-06 NOTE — Telephone Encounter (Signed)
 Prescription sent.  Please have him follow-up with me in the next 4 to 6 weeks so we can review how that medication is working and decide if increased dose needed.  If any side effects in the meantime, should be seen sooner or let me know.  Thanks

## 2024-01-06 NOTE — Telephone Encounter (Signed)
 Patient is only available on Monday's and Tuesday's. I told patient that you are not available on Tuesdays. Patient scheduled soonest appt available in December.

## 2024-01-07 ENCOUNTER — Other Ambulatory Visit: Payer: Self-pay | Admitting: Cardiology

## 2024-01-07 DIAGNOSIS — I10 Essential (primary) hypertension: Secondary | ICD-10-CM

## 2024-01-07 MED ORDER — OLMESARTAN MEDOXOMIL-HCTZ 40-12.5 MG PO TABS: 1.0000 | ORAL_TABLET | Freq: Every morning | ORAL | 0 refills | Status: DC

## 2024-01-09 NOTE — Telephone Encounter (Signed)
 Patient has some questions of when he should take mounjaro

## 2024-01-10 NOTE — Telephone Encounter (Signed)
 Message sent

## 2024-01-16 ENCOUNTER — Other Ambulatory Visit: Payer: Self-pay | Admitting: Cardiology

## 2024-01-16 DIAGNOSIS — I10 Essential (primary) hypertension: Secondary | ICD-10-CM

## 2024-01-19 DIAGNOSIS — E119 Type 2 diabetes mellitus without complications: Secondary | ICD-10-CM | POA: Diagnosis not present

## 2024-01-19 DIAGNOSIS — H40033 Anatomical narrow angle, bilateral: Secondary | ICD-10-CM | POA: Diagnosis not present

## 2024-01-27 ENCOUNTER — Other Ambulatory Visit: Payer: Self-pay

## 2024-01-27 ENCOUNTER — Encounter: Payer: Self-pay | Admitting: Family Medicine

## 2024-01-27 DIAGNOSIS — E1165 Type 2 diabetes mellitus with hyperglycemia: Secondary | ICD-10-CM

## 2024-01-28 ENCOUNTER — Other Ambulatory Visit: Payer: Self-pay | Admitting: Physician Assistant

## 2024-01-28 ENCOUNTER — Other Ambulatory Visit: Payer: Self-pay | Admitting: Cardiology

## 2024-01-28 ENCOUNTER — Other Ambulatory Visit: Payer: Self-pay | Admitting: Family Medicine

## 2024-01-28 DIAGNOSIS — E039 Hypothyroidism, unspecified: Secondary | ICD-10-CM

## 2024-01-28 DIAGNOSIS — E782 Mixed hyperlipidemia: Secondary | ICD-10-CM

## 2024-01-28 DIAGNOSIS — I10 Essential (primary) hypertension: Secondary | ICD-10-CM

## 2024-01-28 MED ORDER — GLUCOSE BLOOD VI STRP: 1.0000 | ORAL_STRIP | Freq: Every day | 8 refills | Status: DC

## 2024-01-28 MED ORDER — ONETOUCH ULTRASOFT LANCETS MISC: 1.0000 | 0 refills | Status: DC

## 2024-01-28 NOTE — Telephone Encounter (Signed)
 Sent Dr. Levora a message yesterday

## 2024-01-29 ENCOUNTER — Telehealth: Payer: Self-pay

## 2024-01-29 ENCOUNTER — Other Ambulatory Visit: Payer: Self-pay

## 2024-01-29 ENCOUNTER — Telehealth: Payer: Self-pay | Admitting: Cardiology

## 2024-01-29 DIAGNOSIS — I10 Essential (primary) hypertension: Secondary | ICD-10-CM

## 2024-01-29 DIAGNOSIS — E1165 Type 2 diabetes mellitus with hyperglycemia: Secondary | ICD-10-CM

## 2024-01-29 MED ORDER — BLOOD GLUCOSE TEST VI STRP: 1.0000 | ORAL_STRIP | 3 refills | Status: AC

## 2024-01-29 MED ORDER — LANCETS MISC: 1.0000 | 3 refills | Status: AC

## 2024-01-29 MED ORDER — BLOOD GLUCOSE MONITORING SUPPL DEVI: 1.0000 | 0 refills | Status: AC

## 2024-01-29 MED ORDER — LANCET DEVICE MISC: 1.0000 | 0 refills | Status: AC

## 2024-01-29 NOTE — Telephone Encounter (Signed)
*  STAT* If patient is at the pharmacy, call can be transferred to refill team.   1. Which medications need to be refilled? (please list name of each medication and dose if known)   olmesartan -hydrochlorothiazide  (BENICAR  HCT) 40-12.5 MG tablet     2. Would you like to learn more about the convenience, safety, & potential cost savings by using the [facility]? no    3. Are you open to using the [facility] (Type [facility].  ).no   4. Which pharmacy/location (including street and city if local pharmacy) is medication to be sent to?[id number] [place], [place] - [address]    5. Do they need a 30 day or 90 day supply? 30 day  Pt is out of medication and has upcoming appt on 04/19/24

## 2024-01-29 NOTE — Addendum Note (Signed)
 Addended by: Avis Tirone R on: 01/29/2024 05:42 PM   Modules accepted: Orders

## 2024-01-29 NOTE — Telephone Encounter (Signed)
 Pt has an upcoming appt in January 2026. Pt has not been seen since 07/2022 by Dr. Ladona. Does pt need to be seen sooner or would Dr. Ladona like to refill this medication until appt in January 2026? Please address

## 2024-01-29 NOTE — Telephone Encounter (Signed)
 Rx sent.

## 2024-01-29 NOTE — Telephone Encounter (Signed)
 I am sorry Dr. Levora patient is needing accucheck meter, lancets and strips sent to [facility] pharmacy. Would you mind changing? I would change it but I am not sure how to order that. Thank you!

## 2024-02-02 ENCOUNTER — Telehealth: Payer: Self-pay | Admitting: Cardiology

## 2024-02-02 DIAGNOSIS — I10 Essential (primary) hypertension: Secondary | ICD-10-CM

## 2024-02-02 MED ORDER — OLMESARTAN MEDOXOMIL-HCTZ 40-12.5 MG PO TABS: 1.0000 | ORAL_TABLET | Freq: Every morning | ORAL | 0 refills | Status: DC

## 2024-02-02 NOTE — Telephone Encounter (Signed)
 RX sent in

## 2024-02-02 NOTE — Telephone Encounter (Signed)
*  STAT* If patient is at the pharmacy, call can be transferred to refill team.   1. Which medications need to be refilled? (please list name of each medication and dose if known)   olmesartan -hydrochlorothiazide  (BENICAR  HCT) 40-12.5 MG tablet    2. Which pharmacy/location (including street and city if local pharmacy) is medication to be sent to? [id number] [place], [place] - [address] Phone: [phone]  Fax: [phone]      3. Do they need a 30 day or 90 day supply? Pt made appt for 04/19/24 please refill til then. pt is out of medication

## 2024-02-03 ENCOUNTER — Telehealth: Payer: Self-pay | Admitting: Family Medicine

## 2024-02-03 ENCOUNTER — Other Ambulatory Visit: Payer: Self-pay | Admitting: Family

## 2024-02-03 DIAGNOSIS — E1165 Type 2 diabetes mellitus with hyperglycemia: Secondary | ICD-10-CM

## 2024-02-03 MED ORDER — TIRZEPATIDE 2.5 MG/0.5ML ~~LOC~~ SOAJ: 2.5000 mg | SUBCUTANEOUS | 1 refills | Status: DC

## 2024-02-03 NOTE — Telephone Encounter (Signed)
 Most recent GFR was 59.66 which barely puts him in stage III chronic kidney disease.  Previously his GFR was higher (better), and certainly can recheck that level to see if we can get a better reading.  Lab only visit for BMP is fine diagnosis of decreased GFR.  Last A1c of 7.8 on September 8 should pass for DOT.  Can provide copy of most recent labs.  Okay to schedule lab visit at his convenience.

## 2024-02-03 NOTE — Telephone Encounter (Signed)
 Encourage patient to contact the pharmacy for refills or they can request refills through [facility]  (Please schedule appointment if patient has not been seen in over a year)    WHAT PHARMACY WOULD THEY LIKE THIS SENT TO: [id number] [place], [place] - [address]   MEDICATION NAME & DOSE: tirzepatide (MOUNJARO) 2.5 MG/0.5ML Pen   NOTES/COMMENTS FROM PATIENT:      Front office please notify patient: It takes 48-72 hours to process rx refill requests Ask patient to call pharmacy to ensure rx is ready before heading there.

## 2024-02-03 NOTE — Telephone Encounter (Signed)
 Patient came in - [facility] attempted to get a DOT physical. They would not pass him because his GFR was low last time it was checked. He was given a sticky note with labs written down - A1C, Creatinine, and GFR and if CKD Stage 4 needs EKG and clearance from Nephrology. Looks like we drew labs on 9/8 for A1C and GFR. I'm assuming for DOT physical reasons they're probably asking for us  to recheck? Please advise on what the next steps should be. His next appt with Dr Levora is on 12/1 for 3 month F/U.

## 2024-03-01 ENCOUNTER — Ambulatory Visit: Admitting: Family Medicine

## 2024-03-02 ENCOUNTER — Encounter: Payer: Self-pay | Admitting: Family Medicine

## 2024-03-08 ENCOUNTER — Ambulatory Visit: Admitting: Family Medicine

## 2024-03-10 ENCOUNTER — Encounter: Payer: Self-pay | Admitting: Family Medicine

## 2024-03-10 ENCOUNTER — Ambulatory Visit: Admitting: Family Medicine

## 2024-03-10 VITALS — BP 118/54 | HR 68 | Temp 98.4°F | Resp 14 | Ht 72.0 in | Wt 224.6 lb

## 2024-03-10 DIAGNOSIS — E1165 Type 2 diabetes mellitus with hyperglycemia: Secondary | ICD-10-CM | POA: Diagnosis not present

## 2024-03-10 DIAGNOSIS — Z7985 Long-term (current) use of injectable non-insulin antidiabetic drugs: Secondary | ICD-10-CM | POA: Diagnosis not present

## 2024-03-10 DIAGNOSIS — F17210 Nicotine dependence, cigarettes, uncomplicated: Secondary | ICD-10-CM

## 2024-03-10 DIAGNOSIS — I10 Essential (primary) hypertension: Secondary | ICD-10-CM

## 2024-03-10 NOTE — Addendum Note (Signed)
 Addended by: HONOR BERN A on: 03/10/2024 08:59 AM   Modules accepted: Orders

## 2024-03-10 NOTE — Patient Instructions (Addendum)
 Thank you for coming in today. No change in medications at this time, but we do have the option for higher dosing of Mounjaro  depending on your A1c. If there are any concerns on your bloodwork, I will let you know. Keep up the good work with cutting back on smoking! Take care!

## 2024-03-10 NOTE — Progress Notes (Signed)
 Subjective:  Patient ID: Robert Wise, male    DOB: [birth year]  Age: 63 y.o. MRN: [id number]  CC:  Chief Complaint  Patient presents with   Diabetes    3 month follow up. No questions or concerns. Patient has been checking sugar avg 100's   Hypertension    Patient misunderstood and thought he needed to check his sugar and not his BP.     HPI Robert Wise presents for   Diabetes: With prior history of hyperglycemia, A1c was improving in September.  Xigduo  consistent dosing recommended.  Was improving with his consistency at that time.  He was on statin and ARB, still had some readings around the 200s. Mounjaro  2.5 mg once per week started. Improved blood sugar readings - 80-130.  Denies any nausea, vomiting, abdominal pain or other side effects with medications.  No mycotic or UTI symptoms. Microalbumin: Normal ratio 08/18/2023 Optho, foot exam, pneumovax: Eye exam last November.  [facility].  Lab Results  Component Value Date   HGBA1C 7.8 (H) 12/08/2023   HGBA1C 8.2 (H) 08/18/2023   HGBA1C 7.1 (H) 09/23/2022   Lab Results  Component Value Date   MICROALBUR 0.9 08/18/2023   LDLCALC 99 09/23/2022   CREATININE 1.28 12/08/2023   Hypertension: Variable readings discussed last visit, had smoked just prior to last visit, also component of whitecoat hypertension possible.  No changes.  No outside readings recently.  Improved on readings today. Home readings: none.  BP Readings from Last 3 Encounters:  03/10/24 (!) 118/54  12/08/23 (!) 146/68  08/18/23 116/68   Lab Results  Component Value Date   CREATININE 1.28 12/08/2023   Nicotine  dependence - cut back to 1 ppd past few weeks. Commended on improvements.   History Patient Active Problem List   Diagnosis Date Noted   History of right-sided carotid endarterectomy 05/15/2022 08/20/2022   Asymptomatic carotid artery stenosis without infarction, right 05/15/2022   Anxiety state 01/25/2018   Fatigue 01/25/2018    Uncontrolled type 2 diabetes mellitus with complication, without long-term current use of insulin  02/10/2015   Depression 10/12/2014   Past Medical History:  Diagnosis Date   Anxiety    Depression    Diabetes mellitus without complication (HCC)    GERD (gastroesophageal reflux disease)    Hyperlipidemia    Hypertension    Hypothyroidism    OSA (obstructive sleep apnea) 12/15/2012   Sleep apnea    Thyroid  disease    Past Surgical History:  Procedure Laterality Date   ANKLE SURGERY Left 04/01/2004   CATARACT EXTRACTION Right 04/2021   ENDARTERECTOMY Right 05/15/2022   Procedure: RIGHT ENDARTERECTOMY CAROTID;  Surgeon: Magda Debby SAILOR, MD;  Location: [facility] OR;  Service: Vascular;  Laterality: Right;   PATCH ANGIOPLASTY Right 05/15/2022   Procedure: BOVINE PATCH ANGIOPLASTY OF RIGHT CAROTID ARTERY;  Surgeon: Magda Debby SAILOR, MD;  Location: [facility] OR;  Service: Vascular;  Laterality: Right;   VASECTOMY     Allergies  Allergen Reactions   Tricor [Fenofibrate] Other (See Comments)    Unk reaction   Zocor [Simvastatin] Other (See Comments)    UNK reaction   Prior to Admission medications   Medication Sig Start Date End Date Taking? Authorizing Provider  aspirin  (ASPIRIN  CHILDRENS) 81 MG chewable tablet Chew 1 tablet (81 mg total) by mouth daily. 12/06/20  Yes Ladona Heinz, MD  atorvastatin  (LIPITOR ) 80 MG tablet TAKE 1 TABLET BY MOUTH EVERY DAY 01/29/24  Yes Levora Reyes SAUNDERS, MD  B Complex  Vitamins (B-COMPLEX/B-12 PO) Take 1 tablet by mouth daily.   Yes [provider]  Blood Glucose Monitoring Suppl DEVI 1 each by Does not apply route as directed. Dispense based on patient and insurance preference. Use up to four times daily as directed. (FOR ICD-10 E10.9, E11.9). 01/29/24  Yes Levora Reyes SAUNDERS, MD  busPIRone  (BUSPAR ) 30 MG tablet Take 1 tablet (30 mg total) by mouth 2 (two) times daily. 10/13/23  Yes Hurst, Verneita DASEN, PA-C  Cholecalciferol (VITAMIN D ) 125 MCG (5000 UT) CAPS Take  5,000 Units by mouth daily.   Yes [provider]  Dapagliflozin  Pro-metFORMIN  ER (XIGDUO  XR) 07-998 MG TB24 Take 2 tablets by mouth daily. 08/18/23  Yes Levora Reyes SAUNDERS, MD  esomeprazole  (NEXIUM ) 40 MG capsule Take 1 capsule (40 mg total) by mouth daily. 04/11/17  Yes Christopher Savannah, PA-C  ferrous sulfate  325 (65 FE) MG tablet Take 325 mg by mouth daily with breakfast.   Yes [provider]  fish oil-omega-3 fatty acids 1000 MG capsule Take 2 g by mouth daily.   Yes [provider]  Glucose Blood (BLOOD GLUCOSE TEST STRIPS) STRP 1 each by Does not apply route as directed. Dispense based on patient and insurance preference. Use up to four times daily as directed. (FOR ICD-10 E10.9, E11.9). 01/29/24  Yes Levora Reyes SAUNDERS, MD  lamoTRIgine  (LAMICTAL ) 200 MG tablet Take 1 tablet (200 mg total) by mouth 2 (two) times daily. 10/13/23  Yes Rhys Verneita DASEN, PA-C  Lancet Device MISC 1 each by Does not apply route as directed. Dispense based on patient and insurance preference. Use up to four times daily as directed. (FOR ICD-10 E10.9, E11.9). 01/29/24  Yes Levora Reyes SAUNDERS, MD  Lancets MISC 1 each by Does not apply route as directed. Dispense based on patient and insurance preference. Use up to four times daily as directed. (FOR ICD-10 E10.9, E11.9). 01/29/24  Yes Levora Reyes SAUNDERS, MD  levothyroxine  (SYNTHROID ) 150 MCG tablet TAKE 1 TABLET BY MOUTH DAILY BEFORE BREAKFAST. 01/29/24  Yes Levora Reyes SAUNDERS, MD  olmesartan -hydrochlorothiazide  (BENICAR  HCT) 40-12.5 MG tablet Take 1 tablet by mouth in the morning. 02/02/24  Yes Ladona Heinz, MD  PARoxetine  (PAXIL ) 30 MG tablet Take 2 tablets (60 mg total) by mouth daily. 10/13/23  Yes Hurst, Verneita DASEN, PA-C  tirzepatide  (MOUNJARO ) 2.5 MG/0.5ML Pen Inject 2.5 mg into the skin once a week. 02/03/24  Yes [name], Padonda B, FNP  UNABLE TO FIND CPAP   Yes [provider]  vitamin B-12 (CYANOCOBALAMIN) 500 MCG tablet Take 500 mcg by mouth daily.    Yes [provider]  GINKGO BILOBA EXTRACT PO Take 120 mg by mouth daily. Patient not taking: Reported on 03/10/2024    [provider]  HYDROcodone -acetaminophen  (NORCO/VICODIN) 5-325 MG tablet Take 1 tablet by mouth every 6 (six) hours as needed for moderate pain (pain score 4-6). Patient not taking: Reported on 10/13/2023 07/25/23   Travonna Swindle R, MD  neomycin-bacitracin-polymyxin (NEOSPORIN) OINT Apply 1 Application topically daily as needed for wound care. Patient not taking: Reported on 03/10/2024    [provider]   Social History   Socioeconomic History   Marital status: Married    Spouse name: Bari   Number of children: 3   Years of education: 11.5   Highest education level: 12th grade  Occupational History    Employer: [employer]    Comment: Driver  Tobacco Use   Smoking status: Every Day    Current  packs/day: 1.50    Average packs/day: 1.5 packs/day for 38.0 years (57.1 ttl pk-yrs)    Types: Cigarettes    Start date: 04/02/2019   Smokeless tobacco: Never  Vaping Use   Vaping status: Never Used  Substance and Sexual Activity   Alcohol use: No    Comment: quit: 08/14/2011   Drug use: No   Sexual activity: Yes    Birth control/protection: None  Other Topics Concern   Not on file  Social History Narrative   Patient lives at home with family.    Caffeine Use: 1 pot of coffee daily   Social Drivers of Corporate Investment Banker Strain: Low Risk  (12/07/2023)   Overall Financial Resource Strain (CARDIA)    Difficulty of Paying Living Expenses: Not very hard  Food Insecurity: No Food Insecurity (12/07/2023)   Hunger Vital Sign    Worried About Running Out of Food in the Last Year: Never true    Ran Out of Food in the Last Year: Never true  Transportation Needs: No Transportation Needs (12/07/2023)   PRAPARE - Administrator, Civil Service (Medical): No    Lack of Transportation (Non-Medical): No  Physical Activity: Unknown  (12/07/2023)   Exercise Vital Sign    Days of Exercise per Week: Patient declined    Minutes of Exercise per Session: Not on file  Stress: Stress Concern Present (12/07/2023)   [employer] of Occupational Health - Occupational Stress Questionnaire    Feeling of Stress: To some extent  Social Connections: Socially Integrated (12/07/2023)   Social Connection and Isolation Panel    Frequency of Communication with Friends and Family: More than three times a week    Frequency of Social Gatherings with Friends and Family: More than three times a week    Attends Religious Services: More than 4 times per year    Active Member of [employer] or Organizations: Yes    Attends Engineer, Structural: More than 4 times per year    Marital Status: Married  Catering Manager Violence: Not on file    Review of Systems  Per HPI.  Objective:   Vitals:   03/10/24 0822  BP: (!) 118/54  Pulse: 68  Resp: 14  Temp: 98.4 F (36.9 C)  TempSrc: Temporal  SpO2: 95%  Weight: 224 lb 9.6 oz (101.9 kg)  Height: 6' (1.829 m)     Physical Exam Vitals reviewed.  Constitutional:      Appearance: He is well-developed.  HENT:     Head: Normocephalic and atraumatic.  Neck:     Vascular: No carotid bruit or JVD.  Cardiovascular:     Rate and Rhythm: Normal rate and regular rhythm.     Heart sounds: Normal heart sounds. No murmur heard. Pulmonary:     Effort: Pulmonary effort is normal.     Breath sounds: Normal breath sounds. No rales.  Musculoskeletal:     Right lower leg: No edema.     Left lower leg: No edema.  Skin:    General: Skin is warm and dry.  Neurological:     Mental Status: He is alert and oriented to person, place, and time.  Psychiatric:        Mood and Affect: Mood normal.        Assessment & Plan:  QUATAVIOUS ROSSA is a 63 y.o. male . Type 2 diabetes mellitus with hyperglycemia, without long-term current use of insulin  (HCC)  - Tolerating current dose of  Mounjaro , as  well as other meds.  Check A1c and adjust dose of Mounjaro  if needed.  33-month follow-up.  Cigarette nicotine  dependence without complication  - Commended on decreased use, recommended looking at the potential benefit he is receiving He does smoke and supplement that with other healthier alternatives such as stress ball, relaxation techniques, relaxation apps or music if that is one of the benefits of cigarettes.  This should help with cessation and he does have patches at home as well.  Advised to let me know if I can help further.  Primary hypertension  - Improved BP on recheck today.  Continue to monitor.  No changes at this time.  No orders of the defined types were placed in this encounter.  Patient Instructions  Thank you for coming in today. No change in medications at this time, but we do have the option for higher dosing of Mounjaro  depending on your A1c. If there are any concerns on your bloodwork, I will let you know. Take care!     Signed,   Reyes Pines, MD [facility], [facility] 03/10/24 8:55 AM

## 2024-03-15 ENCOUNTER — Other Ambulatory Visit

## 2024-03-15 DIAGNOSIS — E1165 Type 2 diabetes mellitus with hyperglycemia: Secondary | ICD-10-CM

## 2024-03-15 LAB — COMPREHENSIVE METABOLIC PANEL WITH GFR
ALT: 18 U/L (ref 0–53)
AST: 20 U/L (ref 0–37)
Albumin: 4.4 g/dL (ref 3.5–5.2)
Alkaline Phosphatase: 68 U/L (ref 39–117)
BUN: 19 mg/dL (ref 6–23)
CO2: 26 meq/L (ref 19–32)
Calcium: 9.6 mg/dL (ref 8.4–10.5)
Chloride: 104 meq/L (ref 96–112)
Creatinine, Ser: 1.08 mg/dL (ref 0.40–1.50)
GFR: 73.02 mL/min (ref >60.00)
Glucose, Bld: 183 mg/dL — ABNORMAL HIGH (ref 70–99)
Potassium: 4.7 meq/L (ref 3.5–5.1)
Sodium: 137 meq/L (ref 135–145)
Total Bilirubin: 0.3 mg/dL (ref 0.2–1.2)
Total Protein: 7.3 g/dL (ref 6.0–8.3)

## 2024-03-15 LAB — HEMOGLOBIN A1C: Hgb A1c MFr Bld: 6.8 % — ABNORMAL HIGH (ref 4.6–6.5)

## 2024-03-19 ENCOUNTER — Ambulatory Visit: Payer: Self-pay | Admitting: Family Medicine

## 2024-04-02 ENCOUNTER — Other Ambulatory Visit (HOSPITAL_COMMUNITY): Payer: Self-pay

## 2024-04-02 ENCOUNTER — Telehealth: Payer: Self-pay

## 2024-04-02 NOTE — Telephone Encounter (Signed)
 Pharmacy Patient Advocate Encounter   Received notification from Onbase that prior authorization for Mounjaro  2.5MG /0.5ML auto-injectors is required/requested.   Insurance verification completed.   The patient is insured through [facility].   Per test claim: PA required; PA submitted to above mentioned insurance via Latent Key/confirmation #/EOC [id number] Status is pending

## 2024-04-05 ENCOUNTER — Other Ambulatory Visit (HOSPITAL_COMMUNITY): Payer: Self-pay

## 2024-04-05 NOTE — Telephone Encounter (Signed)
 Pharmacy Patient Advocate Encounter  Received notification from [facility] that Prior Authorization for  Mounjaro  2.5MG /0.5ML auto-injectors  has been APPROVED from 04/02/24 to 04/02/25   PA #/Case ID/Reference #: [id number]

## 2024-04-12 ENCOUNTER — Ambulatory Visit: Admitting: Physician Assistant

## 2024-04-12 ENCOUNTER — Encounter: Payer: Self-pay | Admitting: Physician Assistant

## 2024-04-12 DIAGNOSIS — F411 Generalized anxiety disorder: Secondary | ICD-10-CM

## 2024-04-12 DIAGNOSIS — G4733 Obstructive sleep apnea (adult) (pediatric): Secondary | ICD-10-CM | POA: Diagnosis not present

## 2024-04-12 DIAGNOSIS — F17200 Nicotine dependence, unspecified, uncomplicated: Secondary | ICD-10-CM

## 2024-04-12 DIAGNOSIS — F3342 Major depressive disorder, recurrent, in full remission: Secondary | ICD-10-CM

## 2024-04-12 MED ORDER — BUSPIRONE HCL 30 MG PO TABS: 30.0000 mg | ORAL_TABLET | Freq: Two times a day (BID) | ORAL | 1 refills | Status: AC

## 2024-04-12 MED ORDER — PAROXETINE HCL 30 MG PO TABS: 60.0000 mg | ORAL_TABLET | Freq: Every day | ORAL | 1 refills | Status: AC

## 2024-04-12 MED ORDER — LAMOTRIGINE 200 MG PO TABS: 200.0000 mg | ORAL_TABLET | Freq: Two times a day (BID) | ORAL | 1 refills | Status: AC

## 2024-04-12 NOTE — Progress Notes (Signed)
 "     [facility] Med Check  Patient ID: Robert Wise,  MRN: [id number]  PCP: Levora Reyes SAUNDERS, MD  Date of Evaluation: 04/12/2024 Time spent:20 minutes  Chief Complaint:  Chief Complaint   Anxiety; Depression; Follow-up    HISTORY/CURRENT STATUS: HPI For routine med check.  Robert Wise is doing well as far as his mood goes.  He is able to enjoy things.  Energy and motivation are good.  Work is going fine.  He sleeps well, uses CPAP faithfully.  He has been started on Mounjaro  and has lost 10 to 15 pounds and his blood sugars are better controlled.  He does have anxiety occasionally but not often.  His medications are working well to prevent and treat it.  ADLs and personal hygiene are normal.  No mania, delirium, psychosis, suicidal or homicidal thoughts.  He is still smoking but has cut back to around 1 pack a day.  He does not even smoke each cigarette fully.  Individual Medical History/ Review of Systems: Changes? 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 Dispense based on patient and insurance preference. Use up to four times daily as directed. (FOR ICD-10 E10.9, E11.9)., Disp: 1 each, Rfl: 0    Cholecalciferol (VITAMIN D ) 125 MCG (5000 UT) CAPS, Take 5,000 Units by mouth daily., Disp: , Rfl:    Dapagliflozin  Pro-metFORMIN  ER (XIGDUO  XR) 07-998 MG TB24, Take 2 tablets by mouth daily., Disp: 180 tablet, Rfl: 1   esomeprazole  (NEXIUM ) 40 MG capsule, Take 1 capsule (40 mg total) by mouth daily., Disp: 90 capsule, Rfl: 3   ferrous sulfate  325 (65 FE) MG tablet, Take 325 mg by mouth daily with breakfast., Disp: , Rfl:    fish oil-omega-3 fatty acids 1000 MG capsule, Take 2 g by mouth daily., Disp: , Rfl:    Glucose Blood (BLOOD GLUCOSE TEST STRIPS) STRP, 1 each by Does not apply route as directed. Dispense based on patient and insurance preference. Use up to four times daily as directed. (FOR ICD-10 E10.9, E11.9)., Disp: 100 strip, Rfl: 3   Lancet Device MISC, 1 each by Does not apply route as directed. Dispense based on patient and insurance preference. Use up to four times daily as directed. (FOR ICD-10 E10.9, E11.9)., Disp: 1 each, Rfl: 0   Lancets MISC, 1 each by Does not apply route as directed. Dispense based on patient and insurance preference. Use up to four times daily as directed. (FOR ICD-10 E10.9, E11.9)., Disp: 100 each, Rfl: 3   levothyroxine  (SYNTHROID ) 150 MCG tablet, TAKE 1 TABLET BY MOUTH DAILY BEFORE BREAKFAST., Disp: 90 tablet, Rfl: 1   olmesartan -hydrochlorothiazide  (BENICAR  HCT) 40-12.5 MG tablet, Take 1 tablet by mouth in  the morning., Disp: 90 tablet, Rfl: 0   tirzepatide  (MOUNJARO ) 2.5 MG/0.5ML Pen, Inject 2.5 mg into the skin once a week., Disp: 2 mL, Rfl: 1   UNABLE TO FIND, CPAP, Disp: , Rfl:    vitamin B-12 (CYANOCOBALAMIN) 500 MCG tablet, Take 500 mcg by mouth daily., Disp: , Rfl:    busPIRone  (BUSPAR ) 30 MG tablet, Take 1 tablet (30 mg total) by mouth 2 (two) times daily., Disp: 180 tablet, Rfl: 1   GINKGO BILOBA EXTRACT PO, Take 120 mg by mouth daily. (Patient not taking: Reported on 03/10/2024), Disp: , Rfl:    HYDROcodone -acetaminophen  (NORCO/VICODIN) 5-325 MG  tablet, Take 1 tablet by mouth every 6 (six) hours as needed for moderate pain (pain score 4-6). (Patient not taking: Reported on 10/13/2023), Disp: 15 tablet, Rfl: 0   lamoTRIgine  (LAMICTAL ) 200 MG tablet, Take 1 tablet (200 mg total) by mouth 2 (two) times daily., Disp: 180 tablet, Rfl: 1   neomycin-bacitracin-polymyxin (NEOSPORIN) OINT, Apply 1 Application topically daily as needed for wound care. (Patient not taking: Reported on 03/10/2024), Disp: , Rfl:    PARoxetine  (PAXIL ) 30 MG tablet, Take 2 tablets (60 mg total) by mouth daily., Disp: 180 tablet, Rfl: 1 Medication Side Effects: none  Family Medical/ Social History: Changes? No  MENTAL HEALTH EXAM:  There were no vitals taken for this visit.There is no height or weight on file to calculate BMI.  General Appearance: Casual, Neat and Well Groomed  Eye Contact:  Good  Speech:  Clear and Coherent and Normal Rate  Volume:  Normal  Mood:  Euthymic  Affect:  Appropriate  Thought Process:  Goal Directed and Descriptions of Associations: Circumstantial  Orientation:  Full (Time, Place, and Person)  Thought Content: Logical   Suicidal Thoughts:  No  Homicidal Thoughts:  No  Memory:  WNL  Judgement:  Good  Insight:  Good  Psychomotor Activity:  Normal  Concentration:  Concentration: Good and Attention Span: Good  Recall:  Good  Fund of Knowledge: Good  Language: Good  Assets:  Communication Skills Desire for Improvement Financial Resources/Insurance Housing Resilience Transportation Vocational/Educational  ADL's:  Intact  Cognition: WNL  Prognosis:  Good   DIAGNOSES:    ICD-10-CM   1. Recurrent major depression in full remission  F33.42     2. Generalized anxiety disorder  F41.1     3. Obstructive sleep apnea  G47.33     4. Smoker  F17.200       Receiving Psychotherapy: No   RECOMMENDATIONS:  PDMP was reviewed. Valium  filled 04/25/2022.  Hydrocodone  given since last visit. I provided approximately  20 minutes of  face to face time during this encounter, including time spent before and after the visit in records review, medical decision making, counseling pertinent to today's visit, and charting.   Smoking cessation discussed.  He plans to continue decreasing the quantity gradually until he is able to quit.  His medications are working well so no changes need to be made.  Continue BuSpar  30 mg, 1 p.o. twice daily.       Continue Lamictal  200 mg, 1 p.o. twice daily. Continue Paxil  30 mg, 2 p.o. daily. Continue multivitamin, ginkgo biloba, B complex, fish oil, vitamin D . Continue using CPAP. Return in 6 months.    Verneita Cooks, PA-C  "

## 2024-04-19 ENCOUNTER — Ambulatory Visit: Payer: Self-pay | Admitting: Cardiology

## 2024-04-19 NOTE — Progress Notes (Unsigned)
 " Cardiology Office Note:  .   Date:  04/19/2024  ID:  Robert Wise, DOB 25-Jan-1961, MRN [id number] PCP: Levora Reyes SAUNDERS, MD  [facility] Providers Cardiologist:  None { Click to update primary MD,subspecialty MD or APP then REFRESH:1}  History of Present Illness: .   Robert Wise is a 63 y.o. Caucasian white male with uncontrolled diabetes mellitus with peripheral neuropathy, hyperlipidemia, primary hypertension, OSA on CPAP and compliant, obesity, tobacco use disorder and who is a truck driver by profession, 49-39+ year history of smoking cigarettes and bilateral asymptomatic carotid artery stenosis S/P right CEA on 05/15/2022     Discussed the use of AI scribe software for clinical note transcription with the patient, who gave verbal consent to proceed.  History of Present Illness   Cardiac Studies relevent.    Cardiac Studies & Procedures   ______________________________________________________________________________________________  Lexiscan  Tetrofosmin stress test 12/19/2020: Small size, mild intensity, fixed perfusion defect most likely secondary to soft tissue attenuation (images obtained while sitting upright and BMI >30).  Without convincing evidence of reversible myocardial ischemia or prior infarct. LVEF 56%, LV size mildly dilated, wall thickness is preserved without regional wall motion abnormalities. Low risk study.   Echocardiogram 12/27/2020: Left ventricle cavity is normal in size. Moderate concentric hypertrophy of the left ventricle. Normal global wall motion. Normal LV systolic function with EF 68%. Doppler evidence of grade I (impaired) diastolic dysfunction, normal LAP. Left atrial cavity is borderline dilated. Trileaflet aortic valve.  Mild (Grade I) aortic regurgitation. Mild (Grade I) mitral regurgitation. Mild tricuspid regurgitation. No evidence of pulmonary hypertension.  LONG TERM MONITOR (3-14 DAYS) 01/11/2021 Zio Patch Extended out  patient EKG monitoring 7 days starting 12/27/2020: Predominant rhythm is normal sinus rhythm.  There were 3 brief atrial tachycardia episodes.  There were occasional PACs, rare PVCs. There were no diary entries however triggered events correlated with PACs and PVCs.  There was no atrial fibrillation, there was no heart block, no complex ventricular arrhythmias.   Carotid artery duplex 09/08/2023: Bilateral ICA 1-39% stenosis.  Right ICA endarterectomy site patent. Bilateral CCA <50% stenosis. >50% stenosis of the left ECA. Antegrade bilateral vertebral artery flow, left subclavian artery was stenotic. ______________________________________________________________________________________________     EKG:      Labs   Lab Results  Component Value Date   CHOL 150 08/18/2023   HDL 23.80 (L) 08/18/2023   LDLCALC 99 09/23/2022   LDLDIRECT 82.0 08/18/2023   TRIG (H) 08/18/2023    446.0 Triglyceride is over 400; calculations on Lipids are invalid.   CHOLHDL 6 08/18/2023   Lipoprotein (a)  Date/Time Value Ref Range Status  02/05/2022 08:56 AM 22.3 <75.0 nmol/L Final    Comment:    Note:  Values greater than or equal to 75.0 nmol/L may        indicate an independent risk factor for CHD,        but must be evaluated with caution when applied        to non-Caucasian populations due to the        influence of genetic factors on Lp(a) across        ethnicities.     Recent Labs    08/18/23 1422 12/08/23 0940 03/15/24 0816  NA 136 138 137  K 4.8 5.0 4.7  CL 101 103 104  CO2 25 27 26   GLUCOSE 133* 144* 183*  BUN 24* 22 19  CREATININE 1.06 1.28 1.08  CALCIUM  9.4 9.8 9.6  Lab Results  Component Value Date   ALT 18 03/15/2024   AST 20 03/15/2024   ALKPHOS 68 03/15/2024   BILITOT 0.3 03/15/2024      Latest Ref Rng & Units 07/25/2023   11:17 AM 05/27/2022    9:15 AM 05/16/2022    3:40 AM  CBC  WBC 4.0 - 10.5 K/uL 7.4  6.8  11.2   Hemoglobin 13.0 - 17.0 g/dL 85.6  85.8  87.2    Hematocrit 39.0 - 52.0 % 42.3  40.7  39.0   Platelets 150.0 - 400.0 K/uL 263.0  245.0  218    Lab Results  Component Value Date   HGBA1C 6.8 (H) 03/15/2024    Lab Results  Component Value Date   TSH 1.62 08/18/2023     ROS  ***ROS Physical Exam:   VS:  There were no vitals taken for this visit.   Wt Readings from Last 3 Encounters:  03/10/24 224 lb 9.6 oz (101.9 kg)  12/08/23 216 lb 12.8 oz (98.3 kg)  08/18/23 223 lb 12.8 oz (101.5 kg)    BP Readings from Last 3 Encounters:  03/10/24 (!) 118/54  12/08/23 (!) 146/68  08/18/23 116/68   ***Physical Exam  ASSESSMENT AND PLAN: .      ICD-10-CM   1. Mixed hyperlipidemia  E78.2     2. History of right-sided carotid endarterectomy 05/15/2022  Z98.890     3. Primary hypertension  I10     4. Tobacco use disorder  F17.200      Assessment & Plan  Follow up: ***  Signed,  Gordy Bergamo, MD, [facility] 04/19/2024, 6:53 AM [facility] [address] [place], [place] [address] Phone: [phone]. Fax:  [phone]  "

## 2024-04-29 ENCOUNTER — Other Ambulatory Visit: Payer: Self-pay | Admitting: Family

## 2024-04-29 DIAGNOSIS — E1165 Type 2 diabetes mellitus with hyperglycemia: Secondary | ICD-10-CM

## 2024-04-30 ENCOUNTER — Other Ambulatory Visit: Payer: Self-pay | Admitting: Cardiology

## 2024-04-30 DIAGNOSIS — I10 Essential (primary) hypertension: Secondary | ICD-10-CM

## 2024-06-07 ENCOUNTER — Ambulatory Visit: Admitting: Family Medicine

## 2024-06-21 ENCOUNTER — Ambulatory Visit: Admitting: Cardiology

## 2024-10-11 ENCOUNTER — Ambulatory Visit (INDEPENDENT_AMBULATORY_CARE_PROVIDER_SITE_OTHER): Admitting: Physician Assistant
